# Patient Record
Sex: Female | Born: 1960
Health system: Southern US, Community
[De-identification: ages and names within clinical notes are randomized; demographics above are authoritative.]

## PROBLEM LIST (undated history)

## (undated) DIAGNOSIS — Z8781 Personal history of (healed) traumatic fracture: Secondary | ICD-10-CM

## (undated) DIAGNOSIS — Z860100 Personal history of colon polyps, unspecified: Secondary | ICD-10-CM

## (undated) DIAGNOSIS — M549 Dorsalgia, unspecified: Secondary | ICD-10-CM

## (undated) DIAGNOSIS — R42 Dizziness and giddiness: Secondary | ICD-10-CM

## (undated) DIAGNOSIS — G4730 Sleep apnea, unspecified: Secondary | ICD-10-CM

## (undated) DIAGNOSIS — M2550 Pain in unspecified joint: Secondary | ICD-10-CM

## (undated) DIAGNOSIS — E538 Deficiency of other specified B group vitamins: Secondary | ICD-10-CM

## (undated) DIAGNOSIS — E119 Type 2 diabetes mellitus without complications: Secondary | ICD-10-CM

## (undated) DIAGNOSIS — R7303 Prediabetes: Secondary | ICD-10-CM

## (undated) DIAGNOSIS — E785 Hyperlipidemia, unspecified: Secondary | ICD-10-CM

## (undated) DIAGNOSIS — K589 Irritable bowel syndrome without diarrhea: Secondary | ICD-10-CM

## (undated) DIAGNOSIS — F319 Bipolar disorder, unspecified: Secondary | ICD-10-CM

## (undated) DIAGNOSIS — F419 Anxiety disorder, unspecified: Secondary | ICD-10-CM

## (undated) DIAGNOSIS — M1990 Unspecified osteoarthritis, unspecified site: Secondary | ICD-10-CM

## (undated) DIAGNOSIS — R030 Elevated blood-pressure reading, without diagnosis of hypertension: Secondary | ICD-10-CM

## (undated) DIAGNOSIS — K5900 Constipation, unspecified: Secondary | ICD-10-CM

## (undated) DIAGNOSIS — E559 Vitamin D deficiency, unspecified: Secondary | ICD-10-CM

## (undated) DIAGNOSIS — F514 Sleep terrors [night terrors]: Secondary | ICD-10-CM

## (undated) DIAGNOSIS — Z87442 Personal history of urinary calculi: Secondary | ICD-10-CM

## (undated) DIAGNOSIS — K219 Gastro-esophageal reflux disease without esophagitis: Secondary | ICD-10-CM

## (undated) DIAGNOSIS — D649 Anemia, unspecified: Secondary | ICD-10-CM

## (undated) HISTORY — DX: Sleep terrors (night terrors): F51.4

## (undated) HISTORY — DX: Personal history of colon polyps, unspecified: Z86.0100

## (undated) HISTORY — DX: Personal history of colonic polyps: Z86.010

## (undated) HISTORY — DX: Bipolar disorder, unspecified: F31.9

## (undated) HISTORY — DX: Pain in unspecified joint: M25.50

## (undated) HISTORY — DX: Anemia, unspecified: D64.9

## (undated) HISTORY — PX: WISDOM TOOTH EXTRACTION: SHX21

## (undated) HISTORY — DX: Irritable bowel syndrome, unspecified: K58.9

## (undated) HISTORY — DX: Dizziness and giddiness: R42

## (undated) HISTORY — DX: Anxiety disorder, unspecified: F41.9

## (undated) HISTORY — PX: JOINT REPLACEMENT: SHX530

## (undated) HISTORY — DX: Constipation, unspecified: K59.00

## (undated) HISTORY — DX: Vitamin D deficiency, unspecified: E55.9

## (undated) HISTORY — DX: Personal history of (healed) traumatic fracture: Z87.81

## (undated) HISTORY — DX: Unspecified osteoarthritis, unspecified site: M19.90

## (undated) HISTORY — PX: CHOLECYSTECTOMY: SHX55

## (undated) HISTORY — DX: Hyperlipidemia, unspecified: E78.5

## (undated) HISTORY — DX: Sleep apnea, unspecified: G47.30

## (undated) HISTORY — DX: Dorsalgia, unspecified: M54.9

## (undated) HISTORY — PX: ABDOMINAL HYSTERECTOMY: SHX81

## (undated) HISTORY — DX: Deficiency of other specified B group vitamins: E53.8

## (undated) HISTORY — DX: Type 2 diabetes mellitus without complications: E11.9

---

## 2014-09-01 DIAGNOSIS — Z8781 Personal history of (healed) traumatic fracture: Secondary | ICD-10-CM

## 2014-09-01 HISTORY — DX: Personal history of (healed) traumatic fracture: Z87.81

## 2015-08-03 DIAGNOSIS — F319 Bipolar disorder, unspecified: Secondary | ICD-10-CM | POA: Insufficient documentation

## 2015-09-02 DIAGNOSIS — F319 Bipolar disorder, unspecified: Secondary | ICD-10-CM

## 2015-09-02 DIAGNOSIS — F514 Sleep terrors [night terrors]: Secondary | ICD-10-CM

## 2015-09-02 HISTORY — DX: Bipolar disorder, unspecified: F31.9

## 2015-09-02 HISTORY — DX: Sleep terrors (night terrors): F51.4

## 2017-06-16 ENCOUNTER — Encounter (HOSPITAL_COMMUNITY): Payer: Self-pay | Admitting: Emergency Medicine

## 2017-06-16 ENCOUNTER — Ambulatory Visit (HOSPITAL_COMMUNITY)
Admission: EM | Admit: 2017-06-16 | Discharge: 2017-06-16 | Disposition: A | Discharge status: Home / Self Care | Payer: Medicaid Other | Attending: Family Medicine | Admitting: Family Medicine

## 2017-06-16 DIAGNOSIS — K219 Gastro-esophageal reflux disease without esophagitis: Secondary | ICD-10-CM | POA: Diagnosis not present

## 2017-06-16 DIAGNOSIS — M7061 Trochanteric bursitis, right hip: Secondary | ICD-10-CM

## 2017-06-16 DIAGNOSIS — E785 Hyperlipidemia, unspecified: Secondary | ICD-10-CM | POA: Diagnosis not present

## 2017-06-16 DIAGNOSIS — L309 Dermatitis, unspecified: Secondary | ICD-10-CM | POA: Diagnosis not present

## 2017-06-16 DIAGNOSIS — M25561 Pain in right knee: Secondary | ICD-10-CM | POA: Diagnosis not present

## 2017-06-16 HISTORY — DX: Gastro-esophageal reflux disease without esophagitis: K21.9

## 2017-06-16 MED ORDER — OMEPRAZOLE 20 MG PO CPDR: 20.0000 mg | DELAYED_RELEASE_CAPSULE | Freq: Every day | ORAL | 1 refills | Status: DC

## 2017-06-16 MED ORDER — LIDOCAINE HCL (PF) 1 % IJ SOLN
INTRAMUSCULAR | Status: AC
  Filled 2017-06-16: qty 2

## 2017-06-16 MED ORDER — TRIAMCINOLONE ACETONIDE 40 MG/ML IJ SUSP
INTRAMUSCULAR | Status: AC
  Filled 2017-06-16: qty 1

## 2017-06-16 MED ORDER — LIDOCAINE HCL (PF) 2 % IJ SOLN
INTRAMUSCULAR | Status: AC
  Filled 2017-06-16: qty 2

## 2017-06-16 MED ORDER — TRIAMCINOLONE ACETONIDE 0.5 % EX OINT: TOPICAL_OINTMENT | CUTANEOUS | 0 refills | Status: DC

## 2017-06-16 MED ORDER — ATORVASTATIN CALCIUM 80 MG PO TABS: 80.0000 mg | ORAL_TABLET | Freq: Every day | ORAL | 1 refills | Status: DC

## 2017-06-16 MED ORDER — MELOXICAM 7.5 MG PO TABS: 7.5000 mg | ORAL_TABLET | Freq: Every day | ORAL | 0 refills | Status: DC

## 2017-06-16 NOTE — ED Triage Notes (Signed)
PT reports she has severe gastric reflux and has been out of her medications. PT just moved to [facility] and got her medicaid straightened out.  PT also has a rash on her hands that she thinks is from latex gloves she wears at work.  PT is also out of her cholesterol meds.   PT also reports right leg pain that she usually gets cortisone shots in.

## 2017-06-16 NOTE — Discharge Instructions (Signed)
Get a primary care doctor!
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
## 2017-08-20 ENCOUNTER — Other Ambulatory Visit: Payer: Self-pay | Admitting: Family Medicine

## 2017-08-23 ENCOUNTER — Ambulatory Visit (HOSPITAL_COMMUNITY)
Admission: EM | Admit: 2017-08-23 | Discharge: 2017-08-23 | Disposition: A | Discharge status: Home / Self Care | Payer: Medicaid Other | Attending: Family Medicine | Admitting: Family Medicine

## 2017-08-23 ENCOUNTER — Other Ambulatory Visit: Payer: Self-pay

## 2017-08-23 ENCOUNTER — Encounter (HOSPITAL_COMMUNITY): Payer: Self-pay | Admitting: Family Medicine

## 2017-08-23 DIAGNOSIS — K0889 Other specified disorders of teeth and supporting structures: Secondary | ICD-10-CM

## 2017-08-23 MED ORDER — PENICILLIN V POTASSIUM 500 MG PO TABS: 500.0000 mg | ORAL_TABLET | Freq: Three times a day (TID) | ORAL | 0 refills | Status: DC

## 2017-08-23 MED ORDER — HYDROCODONE-ACETAMINOPHEN 5-325 MG PO TABS: 1.0000 | ORAL_TABLET | Freq: Four times a day (QID) | ORAL | 0 refills | Status: DC | PRN

## 2017-08-23 NOTE — ED Provider Notes (Signed)
  [place]   [id number] 08/23/17 Arrival Time: 1201   SUBJECTIVE:  Kenyah Luba is a 56 y.o. female who presents to the urgent care with complaint of tooth pain in her left 1st molar, per pt she is having sharp pain that's causing her to have left ear ache and per pt she can not close her mouth.   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 GERD (gastroesophageal reflux disease)    No family history on file. Social History   Socioeconomic History  . Marital status: Married    Spouse name: Not on file  . Number of children: Not on file  . Years of education: Not on file  . Highest education level: Not on file  Social Needs  . Financial resource strain: Not on file  . Food insecurity - worry: Not on file  . Food insecurity - inability: Not on file  . Transportation needs - medical: Not on file  . Transportation needs - non-medical: Not on file  Occupational History  . Not on file  Tobacco Use  . Smoking status: Never Smoker  . Smokeless tobacco: Never Used  Substance and Sexual Activity  . Alcohol use: Yes  . Drug use: No  . Sexual activity: Not on file  Other Topics Concern  . Not on file  Social History Narrative  . Not on file   No outpatient medications have been marked as taking for the 08/23/17 encounter [facility] Encounter).   Allergies  Allergen Reactions  . Depakote [Divalproex Sodium] Other (See Comments)    Hair fell out  . Latex Rash      ROS: As per HPI, remainder of ROS negative.   OBJECTIVE:   Vitals:   08/23/17 1220  BP: (!) 157/97  Pulse: 92  Temp: 98.1 F (36.7 C)  TempSrc: Oral  SpO2: 96%     General appearance: alert; no distress Eyes: PERRL; EOMI; conjunctiva normal HENT: normocephalic; atraumatic; TMs normal, canal normal, external ears normal without trauma; nasal  mucosa normal; oral mucosa normal  Neck: supple Extremities: no cyanosis or edema; symmetrical with no gross deformities Skin: warm and dry Neurologic: normal gait; grossly normal Psychological: alert and cooperative; normal mood and affect         ASSESSMENT & PLAN:  1. Pain, dental     Meds ordered this encounter  Medications  . HYDROcodone-acetaminophen (NORCO) 5-325 MG tablet    Sig: Take 1 tablet by mouth every 6 (six) hours as needed for moderate pain.    Dispense:  12 tablet    Refill:  0  . penicillin v potassium (VEETID) 500 MG tablet    Sig: Take 1 tablet (500 mg total) by mouth 3 (three) times daily.    Dispense:  30 tablet    Refill:  0    Reviewed expectations re: course of current medical issues. Questions answered. Outlined signs and symptoms indicating need for more acute intervention. Patient verbalized understanding. After Visit Summary given.      Robyn Haber, MD 08/23/17 1228

## 2017-08-23 NOTE — ED Triage Notes (Signed)
Per pt she is having tooth pain in her left 1st molar, per pt she is having sharp pain that's causing her to have left ear ache and per pt she can not close her mouth.

## 2017-08-23 NOTE — Discharge Instructions (Signed)
Contact your dentist tomorrow

## 2017-09-06 ENCOUNTER — Other Ambulatory Visit: Payer: Self-pay | Admitting: Family Medicine

## 2017-12-10 ENCOUNTER — Other Ambulatory Visit: Payer: Self-pay | Admitting: Family Medicine

## 2018-01-01 ENCOUNTER — Other Ambulatory Visit: Payer: Self-pay | Admitting: Internal Medicine

## 2018-01-01 ENCOUNTER — Other Ambulatory Visit: Payer: Self-pay

## 2018-01-01 ENCOUNTER — Ambulatory Visit: Payer: Self-pay | Admitting: Internal Medicine

## 2018-01-01 VITALS — BP 139/92 | HR 98 | Temp 98.9°F | Ht 63.0 in | Wt 200.2 lb

## 2018-01-01 DIAGNOSIS — Z87891 Personal history of nicotine dependence: Secondary | ICD-10-CM

## 2018-01-01 DIAGNOSIS — L253 Unspecified contact dermatitis due to other chemical products: Secondary | ICD-10-CM

## 2018-01-01 DIAGNOSIS — M25561 Pain in right knee: Secondary | ICD-10-CM

## 2018-01-01 DIAGNOSIS — E785 Hyperlipidemia, unspecified: Secondary | ICD-10-CM

## 2018-01-01 DIAGNOSIS — Z79899 Other long term (current) drug therapy: Secondary | ICD-10-CM

## 2018-01-01 DIAGNOSIS — K2970 Gastritis, unspecified, without bleeding: Secondary | ICD-10-CM | POA: Insufficient documentation

## 2018-01-01 DIAGNOSIS — Z83438 Family history of other disorder of lipoprotein metabolism and other lipidemia: Secondary | ICD-10-CM

## 2018-01-01 DIAGNOSIS — L235 Allergic contact dermatitis due to other chemical products: Secondary | ICD-10-CM

## 2018-01-01 DIAGNOSIS — K219 Gastro-esophageal reflux disease without esophagitis: Secondary | ICD-10-CM

## 2018-01-01 DIAGNOSIS — Z91040 Latex allergy status: Secondary | ICD-10-CM

## 2018-01-01 DIAGNOSIS — Z0000 Encounter for general adult medical examination without abnormal findings: Secondary | ICD-10-CM

## 2018-01-01 DIAGNOSIS — M62838 Other muscle spasm: Secondary | ICD-10-CM

## 2018-01-01 MED ORDER — OMEPRAZOLE 20 MG PO CPDR: 20.0000 mg | DELAYED_RELEASE_CAPSULE | Freq: Every day | ORAL | 1 refills | Status: DC

## 2018-01-01 MED ORDER — BACLOFEN 10 MG PO TABS: 10.0000 mg | ORAL_TABLET | Freq: Three times a day (TID) | ORAL | 3 refills | Status: AC | PRN

## 2018-01-01 MED ORDER — TRIAMCINOLONE ACETONIDE 0.5 % EX OINT: TOPICAL_OINTMENT | CUTANEOUS | 0 refills | Status: DC

## 2018-01-01 MED ORDER — ATORVASTATIN CALCIUM 80 MG PO TABS: 80.0000 mg | ORAL_TABLET | Freq: Every day | ORAL | 5 refills | Status: DC

## 2018-01-01 MED FILL — ATORVASTATIN 80 MG TABLET: 80 | 30 days supply | Qty: 30 | Fill #0

## 2018-01-01 MED FILL — BACLOFEN 10 MG TABLET: 10 | 10 days supply | Qty: 30 | Fill #0

## 2018-01-01 MED FILL — OMEPRAZOLE 20 MG CAP: 20 | 30 days supply | Qty: 30 | Fill #0

## 2018-01-01 NOTE — Patient Instructions (Signed)
Ms. Lovan it was good to meet you.  Thanks for choosing [place] for your care.  We will get some blood work for you today to check your blood count, liver and renal function, in addition we will get a lipid panel to check your cholesterol.  We have given you a handout for how to get a mammogram.  We will request your shot records and other information.  We will need records from your GI office as well to confirm a negative biopsy for H. Pylori given your positive antibody testing.

## 2018-01-01 NOTE — Assessment & Plan Note (Addendum)
Diagnosed by her GI doctor in [place], has had extensive workup per pt including EGD and pH testing.  Pt has been out of omeprazole for months now and her symptoms have returned.      -will need records from GI office confirming negative H. Pylori by biopsy as pt has antibody positive result seen in care everywhere -refilled omeprazole 20mg  daily

## 2018-01-01 NOTE — Progress Notes (Signed)
CC: establish care, new pt, knee pain  HPI:  [name] is a 57 y.o. female with PMH below. She moved here in 2017 from [place] for new employment.  Up until now she has been using urgent care for the majority of her primary care needs.  She also sees a psychiatrist that has continued filling her medications despite her losing her insurance.    Please see A&P for status of the patient's chronic medical conditions  Past Medical History:  Diagnosis Date  . Bipolar depression ([place]) 09/2015  . GERD (gastroesophageal reflux disease)   . History of patellar fracture 2016  . History of tibial fracture 2016  . Hyperlipidemia   . Night terrors   . 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  HENT: Negative for hearing loss and tinnitus.   Eyes: Negative for double vision, discharge and redness.  Respiratory: Negative for shortness of breath and wheezing.   Cardiovascular: Negative for chest pain and palpitations.  Gastrointestinal: Positive for heartburn. Negative for nausea and vomiting.  Genitourinary: Negative for dysuria and urgency.  Musculoskeletal: Negative for myalgias and neck pain.  Skin: Negative for rash.  Neurological: Negative for dizziness and headaches.  Endo/Heme/Allergies: Does not bruise/bleed easily.  Psychiatric/Behavioral: Negative for depression and suicidal ideas.    Physical Exam:  Vitals:   01/01/18 0934  BP: (!) 139/92  Pulse: 98  Temp: 98.9 F (37.2 C)  TempSrc: Oral  SpO2: 94%  Weight: 200 lb 3.2 oz (90.8 kg)  Height: 5\' 3"  (1.6 m)   Physical Exam  Constitutional: No distress.  HENT:  Head: Normocephalic and atraumatic.  Mouth/Throat: Oropharynx is clear and moist.  Eyes: Right eye exhibits no discharge. Left eye exhibits no discharge.  Neck: No JVD present.  Cardiovascular: Normal rate, regular rhythm and normal heart sounds. Exam  reveals no gallop and no friction rub.  No murmur heard. Pulmonary/Chest: Effort normal and breath sounds normal. No respiratory distress. She has no wheezes. She has no rales. She exhibits no tenderness.  Abdominal: Soft. Bowel sounds are normal. She exhibits no distension and no mass. There is no tenderness. There is no rebound and no guarding.  Musculoskeletal: She exhibits deformity. She exhibits no edema.       Right knee: She exhibits decreased range of motion.  There is some pain with extension and flexion of right knee.  A midline scar is appreciated from a prior surgery.  There is crepitus felt in bilateral knees  Neurological: She is alert. She exhibits normal muscle tone. Coordination normal.  Skin: Rash (wore latex gloves has contact dermatitis on fingers from latex allergyf) noted. She is not diaphoretic.    Social History   Socioeconomic History  . Marital status: Married    Spouse name: Not on file  . Number of children: Not on file  . Years of education: Not on file  . Highest education level: Not on file  Occupational History  . Not on file  Social Needs  . Financial resource strain: Not on file  . Food insecurity:    Worry: Not on file    Inability: Not on file  . Transportation needs:    Medical: Not on file    Non-medical: Not on file  Tobacco Use  . Smoking status: Former Smoker    Packs/day: 0.25    Years: 4.00    Pack  years: 1.00    Last attempt to quit: 12/21/2017    Years since quitting: 0.0  . Smokeless tobacco: Never Used  Substance and Sexual Activity  . Alcohol use: Yes    Comment: special occasions  . Drug use: No  . Sexual activity: Not on file  Lifestyle  . Physical activity:    Days per week: Not on file    Minutes per session: Not on file  . Stress: Not on file  Relationships  . Social connections:    Talks on phone: Not on file    Gets together: Not on file    Attends religious service: Not on file    Active member of club or  organization: Not on file    Attends meetings of clubs or organizations: Not on file    Relationship status: Not on file  . Intimate partner violence:    Fear of current or ex partner: Not on file    Emotionally abused: Not on file    Physically abused: Not on file    Forced sexual activity: Not on file  Other Topics Concern  . Not on file  Social History Narrative  . Not on file    Family History  Problem Relation Age of Onset  . Hyperlipidemia Mother   . Hypertension Mother   . Arthritis/Rheumatoid Father   . Hyperlipidemia Maternal Aunt   . Hypertension Maternal Aunt   . Cancer Maternal Aunt     Assessment & Plan:   See Encounters Tab for problem based charting.  Patient discussed with Dr. Angelia Mould

## 2018-01-02 ENCOUNTER — Encounter: Payer: Self-pay | Admitting: Internal Medicine

## 2018-01-02 DIAGNOSIS — L253 Unspecified contact dermatitis due to other chemical products: Secondary | ICD-10-CM | POA: Insufficient documentation

## 2018-01-02 DIAGNOSIS — Z716 Tobacco abuse counseling: Secondary | ICD-10-CM | POA: Insufficient documentation

## 2018-01-02 DIAGNOSIS — Z0000 Encounter for general adult medical examination without abnormal findings: Secondary | ICD-10-CM | POA: Insufficient documentation

## 2018-01-02 LAB — CBC
Hematocrit: 41.7 % (ref 34.0–46.6)
Hemoglobin: 13.3 g/dL (ref 11.1–15.9)
MCH: 26.8 pg (ref 26.6–33.0)
MCHC: 31.9 g/dL (ref 31.5–35.7)
MCV: 84 fL (ref 79–97)
PLATELETS: 340 10*3/uL (ref 150–379)
RBC: 4.96 x10E6/uL (ref 3.77–5.28)
RDW: 15.3 % (ref 12.3–15.4)
WBC: 8 10*3/uL (ref 3.4–10.8)

## 2018-01-02 LAB — CMP14 + ANION GAP
ALBUMIN: 4.5 g/dL (ref 3.5–5.5)
ALK PHOS: 76 IU/L (ref 39–117)
ALT: 11 IU/L (ref 0–32)
ANION GAP: 16 mmol/L (ref 10.0–18.0)
AST: 11 IU/L (ref 0–40)
Albumin/Globulin Ratio: 2 (ref 1.2–2.2)
BUN / CREAT RATIO: 23 (ref 9–23)
BUN: 13 mg/dL (ref 6–24)
CO2: 24 mmol/L (ref 20–29)
Calcium: 9.1 mg/dL (ref 8.7–10.2)
Chloride: 100 mmol/L (ref 96–106)
Creatinine, Ser: 0.56 mg/dL — ABNORMAL LOW (ref 0.57–1.00)
GFR calc Af Amer: 119 mL/min/{1.73_m2} (ref >59)
GFR, EST NON AFRICAN AMERICAN: 103 mL/min/{1.73_m2} (ref >59)
Globulin, Total: 2.2 g/dL (ref 1.5–4.5)
Glucose: 108 mg/dL — ABNORMAL HIGH (ref 65–99)
Potassium: 4.7 mmol/L (ref 3.5–5.2)
SODIUM: 140 mmol/L (ref 134–144)
Total Protein: 6.7 g/dL (ref 6.0–8.5)

## 2018-01-02 LAB — LIPID PANEL
CHOLESTEROL TOTAL: 208 mg/dL — AB (ref 100–199)
Chol/HDL Ratio: 2.7 ratio (ref 0.0–4.4)
HDL: 76 mg/dL (ref >39)
LDL Calculated: 100 mg/dL — ABNORMAL HIGH (ref 0–99)
Triglycerides: 162 mg/dL — ABNORMAL HIGH (ref 0–149)
VLDL CHOLESTEROL CAL: 32 mg/dL (ref 5–40)

## 2018-01-02 NOTE — Assessment & Plan Note (Addendum)
Lab Results  Component Value Date   CHOL 208 (H) 01/01/2018   HDL 76 01/01/2018   LDLCALC 100 (H) 01/01/2018   TRIG 162 (H) 01/01/2018   CHOLHDL 2.7 01/01/2018   Pt has been out of lipitor for months now. She was on 80mg  daily by her old PCP.  Has a strong family history of hyperlipidemia on her mothers side.  Pt is obese as well.    -checked lipid panel today and confirms pt with LDL 100, elevated TG's but good HDL -restart atorvastatin 80mg  daily

## 2018-01-02 NOTE — Assessment & Plan Note (Signed)
Pt has rash consistent with contact dermatitis of fingers.  Rash appears to be resolving.  Has used triamcinolone cream in the past to help with this.    -will refill a tube of kenalog 0.5%

## 2018-01-02 NOTE — Assessment & Plan Note (Signed)
Pt has resulting muscle spasms in her anterior thigh and lower leg for the surgery.  She has taken baclofen in the past for this with good result.    -will represcribe baclofen 10mg  TID PRN

## 2018-01-02 NOTE — Assessment & Plan Note (Signed)
Pt reports being up to date on the majority of her screenings colonoscopy, pap smear.  She does need a mammogram.  She is unsure about her immunizations, we are obtaining records.  -gave pt information for mammogram scholarship program.

## 2018-01-05 NOTE — Progress Notes (Signed)
Internal Medicine Clinic Attending  Case discussed with Dr. Winfrey  at the time of the visit.  We reviewed the resident's history and exam and pertinent patient test results.  I agree with the assessment, diagnosis, and plan of care documented in the resident's note.  

## 2018-01-13 MED FILL — QUETIAPINE FUMARATE 25 MG T: 25 | 30 days supply | Qty: 30 | Fill #0

## 2018-01-13 MED FILL — PRAZOSIN 1 MG CAPSULE: 1 | 30 days supply | Qty: 60 | Fill #0

## 2018-01-13 MED FILL — AMOXICILLIN 500 MG CAPSULE: 500 | 7 days supply | Qty: 21 | Fill #0

## 2018-01-13 MED FILL — lamoTRIgine 25 MG TABS: 25 | 30 days supply | Qty: 60 | Fill #0

## 2018-01-13 MED FILL — ACETAMINOPHEN/COD #3 TABLET: 300-30 | 2 days supply | Qty: 12 | Fill #0

## 2018-01-27 MED FILL — ATORVASTATIN 80 MG TABLET: 80 | 30 days supply | Qty: 30 | Fill #1

## 2018-01-27 MED FILL — OMEPRAZOLE 20 MG CAP: 20 | 30 days supply | Qty: 30 | Fill #1

## 2018-02-03 ENCOUNTER — Ambulatory Visit: Payer: Self-pay

## 2018-02-03 ENCOUNTER — Encounter: Payer: Self-pay | Admitting: Internal Medicine

## 2018-02-11 MED FILL — lamoTRIgine 25 MG TABS: 25 | 30 days supply | Qty: 60 | Fill #1

## 2018-02-11 MED FILL — BACLOFEN 10 MG TABLET: 10 | 10 days supply | Qty: 30 | Fill #1

## 2018-02-11 MED FILL — QUETIAPINE FUMARATE 25 MG T: 25 | 30 days supply | Qty: 30 | Fill #1

## 2018-02-11 MED FILL — PRAZOSIN 1 MG CAPSULE: 1 | 30 days supply | Qty: 60 | Fill #1

## 2018-02-15 ENCOUNTER — Ambulatory Visit: Payer: Self-pay

## 2018-02-15 ENCOUNTER — Ambulatory Visit (INDEPENDENT_AMBULATORY_CARE_PROVIDER_SITE_OTHER): Payer: Self-pay | Admitting: Internal Medicine

## 2018-02-15 ENCOUNTER — Other Ambulatory Visit: Payer: Self-pay

## 2018-02-15 ENCOUNTER — Encounter: Payer: Self-pay | Admitting: Internal Medicine

## 2018-02-15 DIAGNOSIS — H9319 Tinnitus, unspecified ear: Secondary | ICD-10-CM

## 2018-02-15 DIAGNOSIS — R21 Rash and other nonspecific skin eruption: Secondary | ICD-10-CM

## 2018-02-15 DIAGNOSIS — L209 Atopic dermatitis, unspecified: Secondary | ICD-10-CM

## 2018-02-15 MED ORDER — TRIAMCINOLONE ACETONIDE 0.5 % EX OINT: TOPICAL_OINTMENT | CUTANEOUS | 0 refills | Status: DC

## 2018-02-15 MED FILL — TRIAMCINOLONE 0.5% OINTMENT: 0.5 | 15 days supply | Qty: 30 | Fill #0

## 2018-02-15 NOTE — Patient Instructions (Addendum)
Meghan Wilson,   I have written you a prescription for a topical steroid cream.   Apply the ointment to the areas of rash on your hands up to two times daily as needed.   I also recommend hand mositurizing with Eucerin lotion or Cerave skin protectant.   Return to clinic if your symptoms worsen or fail to improve.

## 2018-02-15 NOTE — Assessment & Plan Note (Addendum)
Patient presenting with bilateral hand rash that appears consistent with atopic dermatitis. Lesions are focal, well-circumscribed, and small located on dorsum of hand and on several fingers bilaterally. No active oozing or weeping. Minimal surrounding erythema. She denies exposure to latex or other allergens. She has been wearing latex free gloves at work.  She has previously used hand lotions/emollients, which seem to prevent these breakouts.  In the past, she has received Solu-Medrol injection and p.o. prednisone is requesting to have the injection again.  Given lack of allergen exposure patients presentation seems consistent with eczema.  Systemic steroid injection is not appropriate in this case as her lesions are small and focal and can be treated topically.  Plan:  -Triamcinolone 0.5% BID for rash PRN  -Recommended moisturizing hands with Eucerin lotion for skin protectant -RTC if symptoms worsen or fail to improve

## 2018-02-15 NOTE — Progress Notes (Signed)
   CC: hand rash  HPI:  [name] is a 57 y.o. female with a past medical history listed below here today for bilateral hand rash.  For details of today's visit and the status of her acute and chronic medical issues please refer to the assessment and plan.  Past Medical History:  Diagnosis Date  . Bipolar depression ([place]) 09/2015  . GERD (gastroesophageal reflux disease)   . History of patellar fracture 2016  . History of tibial fracture 2016  . Hyperlipidemia   . Night terrors   . Night terrors, adult 09/2015   Review of Systems:   Review of Systems  Constitutional: Negative for chills and fever.  HENT: Positive for tinnitus. Negative for ear discharge and ear pain.   Respiratory: Negative for shortness of breath.   Cardiovascular: Negative for chest pain.  Skin: Positive for itching and rash.     Physical Exam:  Vitals:   02/15/18 1522  BP: (!) 141/71  Pulse: 99  Temp: 99.2 F (37.3 C)  TempSrc: Oral  SpO2: 100%  Weight: 199 lb 12.8 oz (90.6 kg)  Height: 5\' 2"  (1.575 m)   Physical Exam  Constitutional: She appears well-developed and well-nourished. No distress.  HENT:  Head: Normocephalic and atraumatic.  Eyes: Conjunctivae are normal. No scleral icterus.  Cardiovascular: Normal rate, regular rhythm and normal heart sounds.  Pulmonary/Chest: Effort normal and breath sounds normal.  Skin: Rash noted.  Well circumscribed, focal areas of maculopapular rash without oozing; no scaling     Assessment & Plan:   See Encounters Tab for problem based charting.  Patient discussed with Dr. Lynnae January

## 2018-02-16 NOTE — Progress Notes (Signed)
Internal Medicine Clinic Attending  Case discussed with Dr. LaCroce at the time of the visit.  We reviewed the resident's history and exam and pertinent patient test results.  I agree with the assessment, diagnosis, and plan of care documented in the resident's note.  

## 2018-02-18 ENCOUNTER — Other Ambulatory Visit: Payer: Self-pay | Admitting: Obstetrics and Gynecology

## 2018-02-18 DIAGNOSIS — Z1231 Encounter for screening mammogram for malignant neoplasm of breast: Secondary | ICD-10-CM

## 2018-03-01 ENCOUNTER — Other Ambulatory Visit: Payer: Self-pay | Admitting: Internal Medicine

## 2018-03-01 DIAGNOSIS — K219 Gastro-esophageal reflux disease without esophagitis: Secondary | ICD-10-CM

## 2018-03-01 MED FILL — OMEPRAZOLE 20 MG CAP: 20 | 30 days supply | Qty: 30 | Fill #0

## 2018-03-01 MED FILL — ATORVASTATIN 80 MG TABLET: 80 | 30 days supply | Qty: 30 | Fill #2

## 2018-03-01 NOTE — Telephone Encounter (Signed)
Patient is requesting refills prilosec 20mg , pls send [facility] pharmacy

## 2018-03-10 ENCOUNTER — Other Ambulatory Visit: Payer: Self-pay | Admitting: Oncology

## 2018-03-10 ENCOUNTER — Encounter: Payer: Self-pay | Admitting: Internal Medicine

## 2018-03-10 ENCOUNTER — Ambulatory Visit (INDEPENDENT_AMBULATORY_CARE_PROVIDER_SITE_OTHER): Payer: Self-pay | Admitting: Internal Medicine

## 2018-03-10 VITALS — BP 129/80 | HR 103 | Temp 98.4°F | Ht 62.0 in | Wt 195.1 lb

## 2018-03-10 DIAGNOSIS — F3130 Bipolar disorder, current episode depressed, mild or moderate severity, unspecified: Secondary | ICD-10-CM

## 2018-03-10 DIAGNOSIS — Z79891 Long term (current) use of opiate analgesic: Secondary | ICD-10-CM

## 2018-03-10 DIAGNOSIS — L209 Atopic dermatitis, unspecified: Secondary | ICD-10-CM

## 2018-03-10 DIAGNOSIS — K2970 Gastritis, unspecified, without bleeding: Secondary | ICD-10-CM

## 2018-03-10 DIAGNOSIS — M25561 Pain in right knee: Secondary | ICD-10-CM | POA: Insufficient documentation

## 2018-03-10 DIAGNOSIS — T1490XA Injury, unspecified, initial encounter: Principal | ICD-10-CM

## 2018-03-10 DIAGNOSIS — F514 Sleep terrors [night terrors]: Secondary | ICD-10-CM

## 2018-03-10 DIAGNOSIS — Z9049 Acquired absence of other specified parts of digestive tract: Secondary | ICD-10-CM

## 2018-03-10 DIAGNOSIS — K2950 Unspecified chronic gastritis without bleeding: Secondary | ICD-10-CM

## 2018-03-10 DIAGNOSIS — Z79899 Other long term (current) drug therapy: Secondary | ICD-10-CM

## 2018-03-10 DIAGNOSIS — Z8781 Personal history of (healed) traumatic fracture: Secondary | ICD-10-CM

## 2018-03-10 DIAGNOSIS — G8929 Other chronic pain: Secondary | ICD-10-CM

## 2018-03-10 DIAGNOSIS — K219 Gastro-esophageal reflux disease without esophagitis: Secondary | ICD-10-CM

## 2018-03-10 DIAGNOSIS — E785 Hyperlipidemia, unspecified: Secondary | ICD-10-CM

## 2018-03-10 DIAGNOSIS — H9313 Tinnitus, bilateral: Secondary | ICD-10-CM

## 2018-03-10 DIAGNOSIS — Z23 Encounter for immunization: Secondary | ICD-10-CM

## 2018-03-10 HISTORY — DX: Pain in right knee: M25.561

## 2018-03-10 MED ORDER — HYDROCODONE-ACETAMINOPHEN 5-325 MG PO TABS: 1.0000 | ORAL_TABLET | Freq: Four times a day (QID) | ORAL | 0 refills | Status: DC | PRN

## 2018-03-10 MED ORDER — OMEPRAZOLE 20 MG PO CPDR: 20.0000 mg | DELAYED_RELEASE_CAPSULE | Freq: Every day | ORAL | 2 refills | Status: DC

## 2018-03-10 MED FILL — HYDROCODON-APAP 5-325: 5-325 | 13 days supply | Qty: 50 | Fill #0

## 2018-03-10 NOTE — Progress Notes (Addendum)
   CC: Hand rash, knee pain, tinnitus  HPI: [name] is a 57 y.o. female with history of HLD, GERD, bipolar depression, right knee fx and repair, and atopic dermatitis here for hand rash that is not resolving, right knee pain, and bilateral tinnitus.  Tinnitus: Patient states tinnitus has been present since 2017 and came on suddenly around that time. Pt notes she was stressed at the time. Previous medication given by another doctor did not help. Ears were looked at in May on her visit to the clinic several weeks ago and she said the dr. no sign of inflammation. She says it is very distracting to her, and makes her want to go sleep to make it go away. As there were many issues to address on this visit, patient decided to discuss this further on her next office visit. She also requests a referral to her psychiatrist, Meghan Wilson who she saw before losing her insurance. He prescribes her prazosin, lamotrigine & seroquil 25mg . Went into the hospital 2005 for depression and bipolar disorder.  Needs [facility]ep[facility]ole [facility]ill [facility].  For further details of today's visit and the status of her acute and chronic medical issues please refer to the assessment and plan.     Past Medical History:  Diagnosis Date  . Bipolar depression ([place]) 09/2015  . GERD (gastroesophageal reflux disease)   . History of patellar fracture 2016  . History of tibial fracture 2016  . Hyperlipidemia   . Night terrors   . Night terrors, adult 09/2015     Review of Systems:  Review of Systems  Constitutional: Negative for chills, fever and malaise/fatigue.  HENT: Positive for ear pain (on left) and tinnitus (bilaterally, high pitched on right). Negative for ear discharge and hearing loss.   Respiratory: Negative for shortness of breath and wheezing.   Cardiovascular: Negative for chest pain, palpitations and leg swelling.  Musculoskeletal: Positive for joint pain (pain in right knee and calf,  intermittent). Negative for falls.  Skin: Positive for itching and rash (on hands).  Neurological: Negative for dizziness, tingling, sensory change, weakness and headaches.  Psychiatric/Behavioral: Negative for depression and substance abuse. The patient is not nervous/anxious.      Physical Exam  Constitution: NAD, well-developed, obese HENT: Right & Left external ear canal wnl Eyes: EOM intact Cardiovascular: RRR, normal heart sounds Respiratory: CTAB, normal effort Abdominal: +bs, non-distended, non-tender MSK: cannot lift right knee to full extension, gait with slight limp, strength 5/5 UE & LE Skin: scarring over left knee from trauma and knee repair, scaly, red rash over ulnar aspect of 2nd digits, dry skin over knuckles    Vitals:   03/10/18 1340  BP: 129/80  Pulse: (!) 103  Temp: 98.4 F (36.9 C)  TempSrc: Oral    Assessment & Plan:   See Encounters Tab for problem based charting.  Patient seen with Dr. Beryle Beams   Medicine attending: I personally interviewed and briefly examined this patient on the day of the patient visit and reviewed pertinent clinical ,laboratory, and radiographic data  with resident physician Dr. Hayden Rasmussen and we discussed a management plan.

## 2018-03-10 NOTE — Assessment & Plan Note (Signed)
Mrs. Bruss presents today with bilateral hand rash that is found along the radial aspect of her second digit and over the joints of her fingers. The rash is not readily apparent, but there is some well circumscribed areas of dry, scaly skin that is slightly red. She states triamcinolone shone slight improvement but her hands continue to bother her. She washes her hands frequently at work and home and often has dry skin. On last office visit Eucerin cream was recommended to her. We discussed trying the cream and some hypoallergenic dish soap to keep hands from becoming irritated.   - cont. Triamcinolone - Eucerin cream to keep hands moisterized -  Dawn Free & Gentle dish soap - return if symptoms fail to improve

## 2018-03-10 NOTE — Assessment & Plan Note (Signed)
Patient requests refill for her chronic gastritis post cholecystectomy.

## 2018-03-10 NOTE — Assessment & Plan Note (Addendum)
In 2003 a friend fell on her and broke her right tibia and patella and crushed the knee joint. She has a lot of pain sometimes, and cannot make it to the restroom. She describes the pain as shooting and is the back of the leg down to the ankle. Often the knee throbs and burns. She previously had cortisone shots that helped. Dr. Arrie Eastern gave her pain medication she only took when her leg was severely hurting. Pain is 6/10 and at it's worse feels weak and cannot seem to move even when she wants. She currently works at [employer] and stands on her feet in place all day, which sometimes exacerbates her pain and makes her job difficult. She is hoping to find a job where she does not have to be on her feet as long. Right knee with limited extension, NTTP, change in gait to favor the left. We discussed that her knee will likely be a chronic problem, which she understands as she has been dealing with it for a long time.   - elevate leg at night - alternate heat and ice as tolerated - hydrocodone-acetaminophen 5-325 q6h prn

## 2018-03-10 NOTE — Patient Instructions (Signed)
Thank you for coming to the clinic today. It was a pleasure to see you.    FOLLOW-UP INSTRUCTIONS When: Three Months with Dr. Sharon Seller   For: Cholesterol check, follow-up for new medications What to bring: all of your medication bottles     Please note these changes made to your medications:   Hydrocodone-acetominophen 5mg -325mg  every 6 hours as needed for right knee pain   Atopic Dermatitis Atopic dermatitis is a skin disorder that causes inflammation of the skin. This is the most common type of eczema. Eczema is a group of skin conditions that cause the skin to be itchy, red, and swollen. This condition is generally worse during the cooler winter months and often improves during the warm summer months. Symptoms can vary from person to person. Atopic dermatitis usually starts showing signs in infancy and can last through adulthood. This condition cannot be passed from one person to another (non-contagious), but it is more common in families. Atopic dermatitis may not always be present. When it is present, it is called a flare-up. What are the causes? The exact cause of this condition is not known. Flare-ups of the condition may be triggered by:  Contact with something that you are sensitive or allergic to.  Stress.  Certain foods.  Extremely hot or cold weather.  Harsh chemicals and soaps.  Dry air.  Chlorine.  What increases the risk? This condition is more likely to develop in people who have a personal history or family history of eczema, allergies, asthma, or hay fever. What are the signs or symptoms? Symptoms of this condition include:  Dry, scaly skin.  Red, itchy rash.  Itchiness, which can be severe. This may occur before the skin rash. This can make sleeping difficult.  Skin thickening and cracking that can occur over time.  How is this diagnosed? This condition is diagnosed based on your symptoms, a medical history, and a physical exam. How is this  treated? There is no cure for this condition, but symptoms can usually be controlled. Treatment focuses on:  Controlling the itchiness and scratching. You may be given medicines, such as antihistamines or steroid creams.  Limiting exposure to things that you are sensitive or allergic to (allergens).  Recognizing situations that cause stress and developing a plan to manage stress.  If your atopic dermatitis does not get better with medicines, or if it is all over your body (widespread), a treatment using a specific type of light (phototherapy) may be used. Follow these instructions at home: Skin care  Keep your skin well-moisturized. Doing this seals in moisture and helps to prevent dryness. ? Use unscented lotions that have petroleum in them. ? Avoid lotions that contain alcohol or water. They can dry the skin.  Keep baths or showers short (less than 5 minutes) in warm water. Do not use hot water. ? Use mild, unscented cleansers for bathing. Avoid soap and bubble bath. ? Apply a moisturizer to your skin right after a bath or shower.  Do not apply anything to your skin without checking with your health care provider. General instructions  Dress in clothes made of cotton or cotton blends. Dress lightly because heat increases itchiness.  When washing your clothes, rinse your clothes twice so all of the soap is removed.  Avoid any triggers that can cause a flare-up.  Try to manage your stress.  Keep your fingernails cut short.  Avoid scratching. Scratching makes the rash and itchiness worse. It may also result in a skin  infection (impetigo) due to a break in the skin caused by scratching.  Take or apply over-the-counter and prescription medicines only as told by your health care provider.  Keep all follow-up visits as told by your health care provider. This is important.  Do not be around people who have cold sores or fever blisters. If you get the infection, it may cause your  atopic dermatitis to worsen. Contact a health care provider if:  Your itchiness interferes with sleep.  Your rash gets worse or it is not better within one week of starting treatment.  You have a fever.  You have a rash flare-up after having contact with someone who has cold sores or fever blisters. Get help right away if:  You develop pus or soft yellow scabs in the rash area. Summary  This condition causes a red rash and itchy, dry, scaly skin.  Treatment focuses on controlling the itchiness and scratching, limiting exposure to things that you are sensitive or allergic to (allergens), recognizing situations that cause stress, and developing a plan to manage stress.  Keep your skin well-moisturized.  Keep baths or showers shorter than 5 minutes and use warm water. Do not use hot water. This information is not intended to replace advice given to you by your health care provider. Make sure you discuss any questions you have with your health care provider. Document Released: 08/15/2000 Document Revised: 09/19/2016 Document Reviewed: 09/19/2016 Elsevier Interactive Patient Education  2018 [employer].   Please call our clinic if you have any questions or concerns, we may be able to help and keep you from a long and expensive emergency room wait. Our clinic and after hours phone number is [phone], the best time to call is Monday through Friday 9 am to 4 pm but there is always someone available 24/7 if you have an emergency. If you need medication refills please notify your pharmacy one week in advance and they will send [place] a request.

## 2018-03-17 MED FILL — lamoTRIgine 25 MG TABS: 25 | 30 days supply | Qty: 60 | Fill #2

## 2018-03-18 ENCOUNTER — Ambulatory Visit (HOSPITAL_COMMUNITY)
Admission: RE | Admit: 2018-03-18 | Discharge: 2018-03-18 | Disposition: A | Discharge status: Home / Self Care | Payer: Self-pay | Source: Ambulatory Visit | Attending: Obstetrics and Gynecology | Admitting: Obstetrics and Gynecology

## 2018-03-18 ENCOUNTER — Ambulatory Visit
Admission: RE | Admit: 2018-03-18 | Discharge: 2018-03-18 | Disposition: A | Discharge status: Home / Self Care | Payer: Self-pay | Source: Ambulatory Visit | Attending: Obstetrics and Gynecology | Admitting: Obstetrics and Gynecology

## 2018-03-18 ENCOUNTER — Encounter (HOSPITAL_COMMUNITY): Payer: Self-pay

## 2018-03-18 VITALS — BP 130/85 | Ht 63.0 in | Wt 193.2 lb

## 2018-03-18 DIAGNOSIS — Z1239 Encounter for other screening for malignant neoplasm of breast: Secondary | ICD-10-CM

## 2018-03-18 DIAGNOSIS — Z1231 Encounter for screening mammogram for malignant neoplasm of breast: Secondary | ICD-10-CM

## 2018-03-18 NOTE — Patient Instructions (Signed)
Explained breast self awareness with Meghan Wilson. Patient did not need a Pap smear today due to patient has a history of a hysterectomy for benign reasons. Let patient know that she doesn't need any further Pap smears due to her history of a hysterectomy for benign reasons. Referred patient to the [facility] for a screening mammogram. Appointment scheduled for Thursday, March 18, 2018 at 1000. Let patient know the [facility] will follow up with her within the next couple weeks with results of mammogram by letter or phone. Meghan Wilson verbalized understanding.  Cici Rodriges, Arvil Chaco, RN 10:37 AM

## 2018-03-18 NOTE — Progress Notes (Signed)
No complaints today.   Pap Smear: Pap smear not completed today. Last Pap smear was in 2013 in [place] and normal per patient. Per patient has no history of an abnormal Pap smear. Patient has a history of a hysterectomy in 1997 due to AUB. Patient no longer needs Pap smears due to her history of a hysterectomy for benign reasons per BCCCP and ACOG guidelines. No Pap smear results are in Epic.  Physical exam: Breasts Breasts symmetrical. No skin abnormalities bilateral breasts. No nipple retraction bilateral breasts. No nipple discharge bilateral breasts. No lymphadenopathy. No lumps palpated bilateral breasts. No complaints of pain or tenderness on exam. Referred patient to the [facility] for a screening mammogram. Appointment scheduled for Thursday, March 18, 2018 at 1000.        Pelvic/Bimanual No Pap smear completed today since patient has a history of a hysterectomy for benign reasons. Pap smear not indicated per BCCCP guidelines.   Smoking History: Patient is a former smoker that quit in April 2019.  Patient Navigation: Patient education provided. Access to services provided for patient through [facility] program.   Colorectal Cancer Screening: Patient has no history of having a colonoscopy completed. No complaints today. FIT Test given to patient to complete and return to BCCCP.  Breast and Cervical Cancer Risk Assessment: Patient has no family history of breast cancer, known genetic mutations, or radiation treatment to the chest before age 55. Patient has no history of cervical dysplasia, immunocompromised, or DES exposure in-utero.  Risk Assessment    Risk Scores      03/18/2018   Last edited by: Loletta Parish, RN   5-year risk: 1.1 %   Lifetime risk: 5.6 %

## 2018-03-19 ENCOUNTER — Other Ambulatory Visit: Payer: Self-pay | Admitting: Obstetrics and Gynecology

## 2018-03-19 ENCOUNTER — Encounter (HOSPITAL_COMMUNITY): Payer: Self-pay | Admitting: *Deleted

## 2018-03-22 ENCOUNTER — Encounter: Payer: Self-pay | Admitting: *Deleted

## 2018-03-26 LAB — FECAL OCCULT BLOOD, IMMUNOCHEMICAL: FECAL OCCULT BLD: NEGATIVE

## 2018-03-30 MED FILL — OMEPRAZOLE 20 MG CAP: 20 | 30 days supply | Qty: 30 | Fill #1

## 2018-03-30 MED FILL — ATORVASTATIN 80 MG TABLET: 80 | 30 days supply | Qty: 30 | Fill #3

## 2018-03-31 ENCOUNTER — Other Ambulatory Visit (HOSPITAL_COMMUNITY): Payer: Self-pay | Admitting: *Deleted

## 2018-03-31 DIAGNOSIS — Z0000 Encounter for general adult medical examination without abnormal findings: Secondary | ICD-10-CM

## 2018-04-01 ENCOUNTER — Encounter (HOSPITAL_COMMUNITY): Payer: Self-pay

## 2018-04-02 ENCOUNTER — Inpatient Hospital Stay: Payer: Self-pay | Attending: Obstetrics and Gynecology | Admitting: *Deleted

## 2018-04-02 ENCOUNTER — Inpatient Hospital Stay: Payer: Self-pay

## 2018-04-02 ENCOUNTER — Other Ambulatory Visit (HOSPITAL_COMMUNITY): Payer: Self-pay | Admitting: *Deleted

## 2018-04-02 VITALS — BP 130/80 | Ht 62.0 in | Wt 193.1 lb

## 2018-04-02 DIAGNOSIS — Z0000 Encounter for general adult medical examination without abnormal findings: Secondary | ICD-10-CM

## 2018-04-02 LAB — LIPID PANEL
CHOL/HDL RATIO: 2.9 ratio
Cholesterol: 171 mg/dL (ref 0–200)
HDL: 59 mg/dL (ref >40)
LDL Cholesterol: 61 mg/dL (ref 0–99)
Triglycerides: 254 mg/dL — ABNORMAL HIGH (ref <150)
VLDL: 51 mg/dL — AB (ref 0–40)

## 2018-04-02 LAB — HEMOGLOBIN A1C
Hgb A1c MFr Bld: 6.5 % — ABNORMAL HIGH (ref 4.8–5.6)
Mean Plasma Glucose: 139.85 mg/dL

## 2018-04-02 NOTE — Addendum Note (Signed)
Addended by: Oliver Hum D on: 04/02/2018 11:16 AM   Modules accepted: Orders

## 2018-04-02 NOTE — Progress Notes (Signed)
[facility] initial screening  Clinical Measurement:  Height:  62in Weight: Blood Pressure: 144/86  Blood Pressure #2: 130/80 Fasting Labs Drawn Today, will review with patient when they result.  Medical History:  Patient states that she has been diagnosed with high cholesterol. Patient states she has not been diagnosed with high blood pressure, diabetes or heart disease.  Medications:  Patients states she is taking medications for high cholesterol. Patient states she is not taking medication for  high blood pressure or diabetes.  She is not taking aspirin daily to prevent heart attack or stroke.    Blood pressure, self measurement:  Patients states she does not measure blood pressure at home.    Nutrition:  Patient states she eats 3 cups of fruit and 0.5 cups of vegetables in an average day.  Patient states she does  eat fish regularly, she eats less than half a serving of whole grains daily. She drinks less than 36 ounces of beverages with added sugar weekly.  She is currently watching her sodium intake.  She has not had 2 drinks containing alcohol in the last seven days.    Physical activity:  Patient states that she gets 0 minutes of moderate exercise in a week.  She gets 0 minutes of vigorous exercise per week.    Smoking status:  Patient states she has quit smoking and is not around any smokers.    Quality of life:  Patient states that she has had 2 bad physical days out of the last 30 days. In the last 2 weeks, she has had 2 days that she has felt down or depressed. She has had 2 days in the last 2 weeks that she has had little interest or pleasure in doing things.  Risk reduction and counseling:  Patient states she wants to lose weight and increase fruit and vegetable intake.  I encouraged her to continue with current exercise regimen and increase vegetable and fruit intake.  Navigation:  I will notify patient of lab results.  Patient is aware of 2 more health coaching sessions and a follow  up.

## 2018-04-05 ENCOUNTER — Encounter: Payer: Self-pay | Admitting: Internal Medicine

## 2018-04-05 ENCOUNTER — Telehealth (HOSPITAL_COMMUNITY): Payer: Self-pay | Admitting: *Deleted

## 2018-04-05 NOTE — Telephone Encounter (Signed)
Health coaching 2  Labs- cholesterol 171, triglycerides 254,HDL 59, LDL 61, hemoglobin A1C 6.5, mean plasma glucose 139.95  Patient is aware and understands these lab results.  Goals- Due to a leg injury,patient can only exercise in 10 minute sessions. Patient states she will do 4 sessions daily.  Patient states she has increased her consumption of fruit, vegetables and water and she cooks with olive oil versus frying.  Navigation:  Patient is aware of 1 more health coaching sessions and a follow up. Patient has an appointment for Friday, August 9th at 9:15am at [facility] regarding her A1C.  Time- 10 minutes

## 2018-04-09 ENCOUNTER — Ambulatory Visit (INDEPENDENT_AMBULATORY_CARE_PROVIDER_SITE_OTHER): Payer: Self-pay | Admitting: Internal Medicine

## 2018-04-09 ENCOUNTER — Other Ambulatory Visit: Payer: Self-pay

## 2018-04-09 VITALS — BP 126/79 | HR 106 | Temp 97.9°F | Ht 62.0 in | Wt 194.8 lb

## 2018-04-09 DIAGNOSIS — T1490XA Injury, unspecified, initial encounter: Principal | ICD-10-CM

## 2018-04-09 DIAGNOSIS — M25561 Pain in right knee: Secondary | ICD-10-CM

## 2018-04-09 DIAGNOSIS — Z9889 Other specified postprocedural states: Secondary | ICD-10-CM

## 2018-04-09 DIAGNOSIS — Z79891 Long term (current) use of opiate analgesic: Secondary | ICD-10-CM

## 2018-04-09 DIAGNOSIS — Z87311 Personal history of (healed) other pathological fracture: Secondary | ICD-10-CM

## 2018-04-09 DIAGNOSIS — G8921 Chronic pain due to trauma: Secondary | ICD-10-CM

## 2018-04-09 NOTE — Assessment & Plan Note (Signed)
RLE pain: Patient has a history of chronic RLE secondary to right tibia and patella fractures requiring surgical fixation in 2003.  She was recently seen 1 month ago at which time she reported exacerbation of her pain and she was prescribed Norco 5-325 mg which she takes PRN only. She brings the pill bottle today which has 25-30 tablets left in it. Pain medication currently helping. She usually takes it at night when the pain bothers her the most. States she used to follow up with orthopedic surgery in [place] and that in the past she has required revision of her surgery due to malposition of screws that led to exacerbation of her pain. She is concerned this current exacerbation episode might be due to the same problem and presents today requesting ortho referral.  - Continue Norco PRN. Did not provide new prescription today.  - Ortho referral placed

## 2018-04-09 NOTE — Progress Notes (Signed)
   CC: Leg pain follow up   HPI:  [name] is a 57 y.o. female with PMH listed below who presents to clinic for follow-up of right lower extremity pain.  RLE pain: Patient has a history of chronic RLE secondary to right tibia and patella fractures requiring surgical fixation in 2003.  She was recently seen 1 month ago at which time she reported exacerbation of her pain and she was prescribed Norco 5-325 mg which she takes PRN only. She brings the pill bottle today which has 25-30 tablets left in it. Pain medication currently helping. She usually takes it at night when the pain bothers her the most. States she used to follow up with orthopedic surgery in [place] and that in the past she has required revision of her surgery due to malposition of screws that led to exacerbation of her pain. She is concerned this current exacerbation episode might be due to the same problem and presents today requesting ortho referral.  - Continue Norco PRN. Did not provide new prescription today.  - Ortho referral placed   Past Medical History:  Diagnosis Date  . Bipolar depression ([place]) 09/2015  . GERD (gastroesophageal reflux disease)   . History of patellar fracture 2016  . History of tibial fracture 2016  . Hyperlipidemia   . Night terrors   . Night terrors, adult 09/2015   Review of Systems:   Review of Systems  Constitutional: Negative for chills, fever and malaise/fatigue.  Musculoskeletal: Positive for joint pain and myalgias. Negative for back pain and falls.  Neurological: Negative for dizziness and headaches.   Physical Exam: Vitals:   04/09/18 0916  BP: 126/79  Pulse: (!) 106  Temp: 97.9 F (36.6 C)  TempSrc: Oral  SpO2: 98%  Weight: 194 lb 12.8 oz (88.4 kg)  Height: 5\' 2"  (1.575 m)   General: pleasant female, well-nourished, well-developed, in no acute distress  Ext: decreased ROM of R knee, limps when ambulating   Assessment & Plan:   See Encounters Tab for problem based  charting.  Patient discussed with Dr. Daryll Drown

## 2018-04-09 NOTE — Patient Instructions (Addendum)
Ms. Kelty,   I have placed a referral to the orthopedic surgeon for evaluation of your knee and leg pain. Please let us know if there is anything else you need.   Please call us if you have any questions or concerns.   - Dr. Frederico Hamman

## 2018-04-12 NOTE — Progress Notes (Signed)
Internal Medicine Clinic Attending  Case discussed with Dr. Santos-Sanchez at the time of the visit.  We reviewed the resident's history and exam and pertinent patient test results.  I agree with the assessment, diagnosis, and plan of care documented in the resident's note.    

## 2018-04-16 MED FILL — lamoTRIgine 25 MG TABS: 25 | 30 days supply | Qty: 60 | Fill #3

## 2018-04-16 MED FILL — BACLOFEN 10 MG TABLET: 10 | 10 days supply | Qty: 30 | Fill #2

## 2018-04-19 ENCOUNTER — Ambulatory Visit (INDEPENDENT_AMBULATORY_CARE_PROVIDER_SITE_OTHER): Payer: Self-pay

## 2018-04-19 ENCOUNTER — Encounter (INDEPENDENT_AMBULATORY_CARE_PROVIDER_SITE_OTHER): Payer: Self-pay | Admitting: Orthopedic Surgery

## 2018-04-19 ENCOUNTER — Ambulatory Visit (INDEPENDENT_AMBULATORY_CARE_PROVIDER_SITE_OTHER): Payer: Self-pay | Admitting: Orthopedic Surgery

## 2018-04-19 VITALS — Ht 62.0 in | Wt 194.0 lb

## 2018-04-19 DIAGNOSIS — M25561 Pain in right knee: Secondary | ICD-10-CM

## 2018-04-19 DIAGNOSIS — G8929 Other chronic pain: Secondary | ICD-10-CM

## 2018-04-19 DIAGNOSIS — M12561 Traumatic arthropathy, right knee: Secondary | ICD-10-CM

## 2018-04-19 MED ORDER — METHYLPREDNISOLONE ACETATE 40 MG/ML IJ SUSP
40.0000 mg | INTRAMUSCULAR | Status: AC | PRN
  Administered 2018-04-19: 40 mg via INTRA_ARTICULAR

## 2018-04-19 MED ORDER — LIDOCAINE HCL 1 % IJ SOLN
5.0000 mL | INTRAMUSCULAR | Status: AC | PRN
  Administered 2018-04-19: 5 mL

## 2018-04-19 NOTE — Progress Notes (Signed)
Office Visit Note   Patient: Meghan Wilson           Date of Birth: April 07, 1961           MRN: [id number] Visit Date: 04/19/2018              Requested by: Welford Roche, MD [address] [place], [place] [address] PCP: Marty Heck, DO  Chief Complaint  Patient presents with  . Right Knee - Pain      HPI: Patient is a 57 year old woman who had a tibial plateau fracture in 2003 as well as a patella fracture in [place].  She had a revision secondary to malposition of the screws with screw removal in 2015.  She states she has increasing pain with all activities of daily living she cannot stand on her right leg.  Pain primarily medially and lateral joint line with some pain over the proximal medial screw.  Assessment & Plan: Visit Diagnoses:  1. Chronic pain of right knee   2. Traumatic arthritis of knee, right     Plan: Knee was injected she tolerated this well plan for follow-up in 4 weeks.  Patient states she is doing therapy for strengthening of her leg.  Discussed that she may be a candidate for hyaluronic acid injection.  Discussed that if the injections do not work her option would be hardware removal with total knee arthroplasty.  Follow-Up Instructions: Return in about 4 weeks (around 05/17/2018).   Ortho Exam  Patient is alert, oriented, no adenopathy, well-dressed, normal affect, normal respiratory effort. Examination patient has an antalgic gait there is no effusion she is tender to palpation over the medial and lateral joint lines as well as the proximal tibial plateau screw.  Collaterals and cruciates are stable no ligamentous instability there are no ulcers no blisters.  Imaging: Xr Knee 1-2 Views Right  Result Date: 04/19/2018 2 view radiographs of the right knee shows traumatic arthritis of the lateral joint line with no subcondylar cysts, there are  periarticular bony spurs there is some backing out of the proximal screw over the medial tibial plateau  with  hardware both medially and laterally.     Labs: Lab Results  Component Value Date   HGBA1C 6.5 (H) 04/02/2018     Lab Results  Component Value Date   ALBUMIN 4.5 01/01/2018    Body mass index is 35.48 kg/m.  Orders:  Orders Placed This Encounter  Procedures  . Large Joint Inj  . XR Knee 1-2 Views Right   No orders of the defined types were placed in this encounter.    Procedures: Large Joint Inj: R knee on 04/19/2018 8:52 AM Indications: pain and diagnostic evaluation Details: 22 G 1.5 in needle, anteromedial approach  Arthrogram: No  Medications: 5 mL lidocaine 1 %; 40 mg methylPREDNISolone acetate 40 MG/ML Outcome: tolerated well, no immediate complications Procedure, treatment alternatives, risks and benefits explained, specific risks discussed. Consent was given by the patient. Immediately prior to procedure a time out was called to verify the correct patient, procedure, equipment, support staff and site/side marked as required. Patient was prepped and draped in the usual sterile fashion.      Clinical Data: No additional findings.  ROS:  All other systems negative, except as noted in the HPI. Review of Systems  Objective: Vital Signs: Ht 5\' 2"  (1.575 m)   Wt 194 lb (88 kg)   BMI 35.48 kg/m   Specialty Comments:  No specialty comments available.  PMFS History: Patient Active Problem List   Diagnosis Date Noted  . Pain of right knee after injury 03/10/2018  . Atopic dermatitis 02/15/2018  . 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 Healthcare maintenance 01/02/2018  . Hyperlipidemia 01/01/2018  . Peptic gastritis 01/01/2018  . Muscle spasm of right lower extremity 01/01/2018  . Morbid obesity ([place]) 08/03/2015  . Bipolar disorder ([place]) 08/03/2015   Past Medical History:  Diagnosis Date  . Bipolar depression ([place]) 09/2015  . GERD (gastroesophageal reflux disease)   . History of patellar fracture 2016  . History of tibial  fracture 2016  . Hyperlipidemia   . Night terrors   . Night terrors, adult 09/2015    Family History  Problem Relation Age of Onset  . Hyperlipidemia Mother   . Hypertension Mother   . Arthritis/Rheumatoid Father   . Hyperlipidemia Maternal Aunt   . Hypertension Maternal Aunt   . Cancer Maternal Aunt     Past Surgical History:  Procedure Laterality Date  . CHOLECYSTECTOMY    . JOINT REPLACEMENT     Social History   Occupational History  . Not on file  Tobacco Use  . Smoking status: Former Smoker    Packs/day: 0.25    Years: 4.00    Pack years: 1.00    Last attempt to quit: 12/21/2017    Years since quitting: 0.3  . Smokeless tobacco: Never Used  Substance and Sexual Activity  . Alcohol use: Yes    Comment: special occasions  . Drug use: No  . Sexual activity: Yes    Birth control/protection: None    Comment: Celibate

## 2018-04-28 MED FILL — OMEPRAZOLE 20 MG CAP: 20 | 30 days supply | Qty: 30 | Fill #0

## 2018-04-28 MED FILL — ATORVASTATIN 80 MG TABLET: 80 | 30 days supply | Qty: 30 | Fill #4

## 2018-04-30 ENCOUNTER — Ambulatory Visit: Payer: Self-pay

## 2018-05-06 ENCOUNTER — Ambulatory Visit (INDEPENDENT_AMBULATORY_CARE_PROVIDER_SITE_OTHER): Payer: Self-pay | Admitting: Internal Medicine

## 2018-05-06 ENCOUNTER — Encounter: Payer: Self-pay | Admitting: Internal Medicine

## 2018-05-06 ENCOUNTER — Other Ambulatory Visit: Payer: Self-pay

## 2018-05-06 VITALS — BP 136/80 | HR 84 | Temp 98.2°F | Wt 191.4 lb

## 2018-05-06 DIAGNOSIS — E785 Hyperlipidemia, unspecified: Secondary | ICD-10-CM

## 2018-05-06 DIAGNOSIS — R030 Elevated blood-pressure reading, without diagnosis of hypertension: Secondary | ICD-10-CM | POA: Insufficient documentation

## 2018-05-06 DIAGNOSIS — Z79899 Other long term (current) drug therapy: Secondary | ICD-10-CM

## 2018-05-06 DIAGNOSIS — Z833 Family history of diabetes mellitus: Secondary | ICD-10-CM

## 2018-05-06 DIAGNOSIS — E663 Overweight: Secondary | ICD-10-CM

## 2018-05-06 DIAGNOSIS — R7303 Prediabetes: Secondary | ICD-10-CM

## 2018-05-06 DIAGNOSIS — Z6835 Body mass index (BMI) 35.0-35.9, adult: Secondary | ICD-10-CM

## 2018-05-06 NOTE — Assessment & Plan Note (Signed)
Patient has an A1c of 6.5, and at [facility] assessment. She denies any polyuria or polydipsia. No change in her appetite or weight. Multiple family members with diabetes and patient is obese.  She is at borderline for prediabetes and diabetes.  She is very motivated to make changes in her lifestyle. We had a long discussion regarding exercising regularly, modify her diet with low-carb diet and lose some weight that should help with her A1c.  Patient seems understanding. She was given a referral  to see [name] to help achieve her goals. She was provided with information about diabetic diet.

## 2018-05-06 NOTE — Progress Notes (Signed)
   CC: Follow-up on A1c from [facility] program.  HPI:  [name] is a 57 y.o. with past medical history as listed below sent to the clinic from [facility] program after finding her A1c at 6.5 and mildly elevated blood pressure.  Patient has many family members with diabetes. She has no complaints today but was worried about her A1c and seems very motivated to make lifestyle modifications so she do not have to start on any medications.  Please see assessment and plan for her problems.  Past Medical History:  Diagnosis Date  . Bipolar depression ([place]) 09/2015  . GERD (gastroesophageal reflux disease)   . History of patellar fracture 2016  . History of tibial fracture 2016  . Hyperlipidemia   . Night terrors   . Night terrors, adult 09/2015   Review of Systems: Negative except mentioned in HPI.  Physical Exam:  Vitals:   05/06/18 1049  BP: 139/78  Pulse: 93  Temp: 98.2 F (36.8 C)  TempSrc: Oral  SpO2: 99%  Weight: 191 lb 6.4 oz (86.8 kg)    General: Vital signs reviewed.  Patient is well-developed and well-nourished, in no acute distress and cooperative with exam.  Head: Normocephalic and atraumatic. Eyes: EOMI, conjunctivae normal, no scleral icterus.  Cardiovascular: RRR, S1 normal, S2 normal, no murmurs, gallops, or rubs. Pulmonary/Chest: Clear to auscultation bilaterally, no wheezes, rales, or rhonchi. Abdominal: Soft, non-tender, non-distended, BS +, no masses, organomegaly, or guarding present.  Extremities: No lower extremity edema bilaterally,  pulses symmetric and intact bilaterally. No cyanosis or clubbing. Neurological: A&O x3, Strength is normal and symmetric bilaterally, cranial nerve II-XII are grossly intact, no focal motor deficit, sensory intact to light touch bilaterally.  Skin: Warm, dry and intact. No rashes or erythema. Psychiatric: Normal mood and affect. speech and behavior is normal. Cognition and memory are normal.  Assessment & Plan:    See Encounters Tab for problem based charting.  Patient discussed with Dr. Eppie Gibson.

## 2018-05-06 NOTE — Assessment & Plan Note (Signed)
She was also worried about her cholesterol. She is on Lipitor 80 mg daily since May 2019. Is having mild hyper hypertriglyceridemia at 252, LDL of 61 and HDL of 59.  Her ASCVD risk score is 8.9% which can be decreased to 2.5% with risk modification.  She needs moderate to high intensity statin, she is on high-dose statin already, and tolerating it very well.  We talked about watching diet and exercise and continue to take her Lipitor.

## 2018-05-06 NOTE — Patient Instructions (Signed)
Thank you for visiting our clinic today. As we discussed you are at the border of diabetes and prediabetic.  Because of your family history of diabetes and being overweight you are at high risk for diabetes. Most important thing at this point to lose some weight by exercise and follow a diet which is low in carbohydrate and salt both. As we also discussed that your blood pressure is little high, I think we can control it with your lifestyle modifications which include low-salt diet, losing some weight and exercise. I am also giving you a referral to see our diabetes coordinator [name] who can help you achieve these goals. Please follow-up in 3 months with your PCP.   Diabetes Mellitus and Nutrition When you have diabetes (diabetes mellitus), it is very important to have healthy eating habits because your blood sugar (glucose) levels are greatly affected by what you eat and drink. Eating healthy foods in the appropriate amounts, at about the same times every day, can help you:  Control your blood glucose.  Lower your risk of heart disease.  Improve your blood pressure.  Reach or maintain a healthy weight.  Every person with diabetes is different, and each person has different needs for a meal plan. Your health care provider may recommend that you work with a diet and nutrition specialist (dietitian) to make a meal plan that is best for you. Your meal plan may vary depending on factors such as:  The calories you need.  The medicines you take.  Your weight.  Your blood glucose, blood pressure, and cholesterol levels.  Your activity level.  Other health conditions you have, such as heart or kidney disease.  How do carbohydrates affect me? Carbohydrates affect your blood glucose level more than any other type of food. Eating carbohydrates naturally increases the amount of glucose in your blood. Carbohydrate counting is a method for keeping track of how many carbohydrates you eat. Counting  carbohydrates is important to keep your blood glucose at a healthy level, especially if you use insulin or take certain oral diabetes medicines. It is important to know how many carbohydrates you can safely have in each meal. This is different for every person. Your dietitian can help you calculate how many carbohydrates you should have at each meal and for snack. Foods that contain carbohydrates include:  Bread, cereal, rice, pasta, and crackers.  Potatoes and corn.  Peas, beans, and lentils.  Milk and yogurt.  Fruit and juice.  Desserts, such as cakes, cookies, ice cream, and candy.  How does alcohol affect me? Alcohol can cause a sudden decrease in blood glucose (hypoglycemia), especially if you use insulin or take certain oral diabetes medicines. Hypoglycemia can be a life-threatening condition. Symptoms of hypoglycemia (sleepiness, dizziness, and confusion) are similar to symptoms of having too much alcohol. If your health care provider says that alcohol is safe for you, follow these guidelines:  Limit alcohol intake to no more than 1 drink per day for nonpregnant women and 2 drinks per day for men. One drink equals 12 oz of beer, 5 oz of wine, or 1 oz of hard liquor.  Do not drink on an empty stomach.  Keep yourself hydrated with water, diet soda, or unsweetened iced tea.  Keep in mind that regular soda, juice, and other mixers may contain a lot of sugar and must be counted as carbohydrates.  What are tips for following this plan? Reading food labels  Start by checking the serving size on the  label. The amount of calories, carbohydrates, fats, and other nutrients listed on the label are based on one serving of the food. Many foods contain more than one serving per package.  Check the total grams (g) of carbohydrates in one serving. You can calculate the number of servings of carbohydrates in one serving by dividing the total carbohydrates by 15. For example, if a food has 30 g  of total carbohydrates, it would be equal to 2 servings of carbohydrates.  Check the number of grams (g) of saturated and trans fats in one serving. Choose foods that have low or no amount of these fats.  Check the number of milligrams (mg) of sodium in one serving. Most people should limit total sodium intake to less than 2,300 mg per day.  Always check the nutrition information of foods labeled as "low-fat" or "nonfat". These foods may be higher in added sugar or refined carbohydrates and should be avoided.  Talk to your dietitian to identify your daily goals for nutrients listed on the label. Shopping  Avoid buying canned, premade, or processed foods. These foods tend to be high in fat, sodium, and added sugar.  Shop around the outside edge of the grocery store. This includes fresh fruits and vegetables, bulk grains, fresh meats, and fresh dairy. Cooking  Use low-heat cooking methods, such as baking, instead of high-heat cooking methods like deep frying.  Cook using healthy oils, such as olive, canola, or sunflower oil.  Avoid cooking with butter, cream, or high-fat meats. Meal planning  Eat meals and snacks regularly, preferably at the same times every day. Avoid going long periods of time without eating.  Eat foods high in fiber, such as fresh fruits, vegetables, beans, and whole grains. Talk to your dietitian about how many servings of carbohydrates you can eat at each meal.  Eat 4-6 ounces of lean protein each day, such as lean meat, chicken, fish, eggs, or tofu. 1 ounce is equal to 1 ounce of meat, chicken, or fish, 1 egg, or 1/4 cup of tofu.  Eat some foods each day that contain healthy fats, such as avocado, nuts, seeds, and fish. Lifestyle   Check your blood glucose regularly.  Exercise at least 30 minutes 5 or more days each week, or as told by your health care provider.  Take medicines as told by your health care provider.  Do not use any products that contain  nicotine or tobacco, such as cigarettes and e-cigarettes. If you need help quitting, ask your health care provider.  Work with a Social worker or diabetes educator to identify strategies to manage stress and any emotional and social challenges. What are some questions to ask my health care provider?  Do I need to meet with a diabetes educator?  Do I need to meet with a dietitian?  What number can I call if I have questions?  When are the best times to check my blood glucose? Where to find more information:  American Diabetes Association: diabetes.org/food-and-fitness/food  Academy of Nutrition and Dietetics: PokerClues.dk  [employer] of Diabetes and Digestive and Kidney Diseases (NIH): ContactWire.be Summary  A healthy meal plan will help you control your blood glucose and maintain a healthy lifestyle.  Working with a diet and nutrition specialist (dietitian) can help you make a meal plan that is best for you.  Keep in mind that carbohydrates and alcohol have immediate effects on your blood glucose levels. It is important to count carbohydrates and to use alcohol carefully. This information  is not intended to replace advice given to you by your health care provider. Make sure you discuss any questions you have with your health care provider. Document Released: 05/15/2005 Document Revised: 09/22/2016 Document Reviewed: 09/22/2016 Elsevier Interactive Patient Education  2018 [place] Eating Plan DASH stands for "Dietary Approaches to Stop Hypertension." The DASH eating plan is a healthy eating plan that has been shown to reduce high blood pressure (hypertension). It may also reduce your risk for type 2 diabetes, heart disease, and stroke. The DASH eating plan may also help with weight loss. What are tips for following this plan? General  guidelines  Avoid eating more than 2,300 mg (milligrams) of salt (sodium) a day. If you have hypertension, you may need to reduce your sodium intake to 1,500 mg a day.  Limit alcohol intake to no more than 1 drink a day for nonpregnant women and 2 drinks a day for men. One drink equals 12 oz of beer, 5 oz of wine, or 1 oz of hard liquor.  Work with your health care provider to maintain a healthy body weight or to lose weight. Ask what an ideal weight is for you.  Get at least 30 minutes of exercise that causes your heart to beat faster (aerobic exercise) most days of the week. Activities may include walking, swimming, or biking.  Work with your health care provider or diet and nutrition specialist (dietitian) to adjust your eating plan to your individual calorie needs. Reading food labels  Check food labels for the amount of sodium per serving. Choose foods with less than 5 percent of the Daily Value of sodium. Generally, foods with less than 300 mg of sodium per serving fit into this eating plan.  To find whole grains, look for the word "whole" as the first word in the ingredient list. Shopping  Buy products labeled as "low-sodium" or "no salt added."  Buy fresh foods. Avoid canned foods and premade or frozen meals. Cooking  Avoid adding salt when cooking. Use salt-free seasonings or herbs instead of table salt or sea salt. Check with your health care provider or pharmacist before using salt substitutes.  Do not fry foods. Cook foods using healthy methods such as baking, boiling, grilling, and broiling instead.  Cook with heart-healthy oils, such as olive, canola, soybean, or sunflower oil. Meal planning   Eat a balanced diet that includes: ? 5 or more servings of fruits and vegetables each day. At each meal, try to fill half of your plate with fruits and vegetables. ? Up to 6-8 servings of whole grains each day. ? Less than 6 oz of lean meat, poultry, or fish each day. A 3-oz  serving of meat is about the same size as a deck of cards. One egg equals 1 oz. ? 2 servings of low-fat dairy each day. ? A serving of nuts, seeds, or beans 5 times each week. ? Heart-healthy fats. Healthy fats called Omega-3 fatty acids are found in foods such as flaxseeds and coldwater fish, like sardines, salmon, and mackerel.  Limit how much you eat of the following: ? Canned or prepackaged foods. ? Food that is high in trans fat, such as fried foods. ? Food that is high in saturated fat, such as fatty meat. ? Sweets, desserts, sugary drinks, and other foods with added sugar. ? Full-fat dairy products.  Do not salt foods before eating.  Try to eat at least 2 vegetarian meals each week.  Eat more home-cooked food  and less restaurant, buffet, and fast food.  When eating at a restaurant, ask that your food be prepared with less salt or no salt, if possible. What foods are recommended? The items listed may not be a complete list. Talk with your dietitian about what dietary choices are best for you. Grains Whole-grain or whole-wheat bread. Whole-grain or whole-wheat pasta. Brown rice. [place]. Bulgur. Whole-grain and low-sodium cereals. Pita bread. Low-fat, low-sodium crackers. Whole-wheat flour tortillas. Vegetables Fresh or frozen vegetables (raw, steamed, roasted, or grilled). Low-sodium or reduced-sodium tomato and vegetable juice. Low-sodium or reduced-sodium tomato sauce and tomato paste. Low-sodium or reduced-sodium canned vegetables. Fruits All fresh, dried, or frozen fruit. Canned fruit in natural juice (without added sugar). Meat and other protein foods Skinless chicken or [place]. Ground chicken or [place]. Pork with fat trimmed off. Fish and seafood. Egg whites. Dried beans, peas, or lentils. Unsalted nuts, nut butters, and seeds. Unsalted canned beans. Lean cuts of beef with fat trimmed off. Low-sodium, lean deli meat. Dairy Low-fat (1%) or fat-free (skim) milk.  Fat-free, low-fat, or reduced-fat cheeses. Nonfat, low-sodium ricotta or cottage cheese. Low-fat or nonfat yogurt. Low-fat, low-sodium cheese. Fats and oils Soft margarine without trans fats. Vegetable oil. Low-fat, reduced-fat, or light mayonnaise and salad dressings (reduced-sodium). Canola, safflower, olive, soybean, and sunflower oils. Avocado. Seasoning and other foods Herbs. Spices. Seasoning mixes without salt. Unsalted popcorn and pretzels. Fat-free sweets. What foods are not recommended? The items listed may not be a complete list. Talk with your dietitian about what dietary choices are best for you. Grains Baked goods made with fat, such as croissants, muffins, or some breads. Dry pasta or rice meal packs. Vegetables Creamed or fried vegetables. Vegetables in a cheese sauce. Regular canned vegetables (not low-sodium or reduced-sodium). Regular canned tomato sauce and paste (not low-sodium or reduced-sodium). Regular tomato and vegetable juice (not low-sodium or reduced-sodium). Angie Fava. Olives. Fruits Canned fruit in a light or heavy syrup. Fried fruit. Fruit in cream or butter sauce. Meat and other protein foods Fatty cuts of meat. Ribs. Fried meat. Berniece Salines. Sausage. Bologna and other processed lunch meats. Salami. Fatback. Hotdogs. Bratwurst. Salted nuts and seeds. Canned beans with added salt. Canned or smoked fish. Whole eggs or egg yolks. Chicken or [place] with skin. Dairy Whole or 2% milk, cream, and half-and-half. Whole or full-fat cream cheese. Whole-fat or sweetened yogurt. Full-fat cheese. Nondairy creamers. Whipped toppings. Processed cheese and cheese spreads. Fats and oils Butter. Stick margarine. Lard. Shortening. Ghee. Bacon fat. Tropical oils, such as coconut, palm kernel, or palm oil. Seasoning and other foods Salted popcorn and pretzels. Onion salt, garlic salt, seasoned salt, table salt, and sea salt. Worcestershire sauce. Tartar sauce. Barbecue sauce. Teriyaki sauce.  Soy sauce, including reduced-sodium. Steak sauce. Canned and packaged gravies. Fish sauce. Oyster sauce. Cocktail sauce. Horseradish that you find on the shelf. Ketchup. Mustard. Meat flavorings and tenderizers. Bouillon cubes. Hot sauce and Tabasco sauce. Premade or packaged marinades. Premade or packaged taco seasonings. Relishes. Regular salad dressings. Where to find more information:  National Heart, Lung, and [place]: https://wilson-eaton.com/  American Heart Association: www.heart.org Summary  The DASH eating plan is a healthy eating plan that has been shown to reduce high blood pressure (hypertension). It may also reduce your risk for type 2 diabetes, heart disease, and stroke.  With the DASH eating plan, you should limit salt (sodium) intake to 2,300 mg a day. If you have hypertension, you may need to reduce your sodium intake to 1,500 mg a  day.  When on the DASH eating plan, aim to eat more fresh fruits and vegetables, whole grains, lean proteins, low-fat dairy, and heart-healthy fats.  Work with your health care provider or diet and nutrition specialist (dietitian) to adjust your eating plan to your individual calorie needs. This information is not intended to replace advice given to you by your health care provider. Make sure you discuss any questions you have with your health care provider. Document Released: 08/07/2011 Document Revised: 08/11/2016 Document Reviewed: 08/11/2016 Elsevier Interactive Patient Education  [employer].

## 2018-05-06 NOTE — Assessment & Plan Note (Signed)
Was having mildly elevated blood pressure in 130s. With history of hypertension.  At this time she needs lifestyle modifications with DASH diet, exercise and weight loss.  We will keep monitoring her blood pressure. Antihypertensives can be added if needed, starting her with ACE inhibitor or ARB will be better because of her prediabetes.

## 2018-05-10 ENCOUNTER — Encounter: Payer: Self-pay | Admitting: Dietician

## 2018-05-10 ENCOUNTER — Ambulatory Visit: Payer: Self-pay | Admitting: Dietician

## 2018-05-10 DIAGNOSIS — R7303 Prediabetes: Secondary | ICD-10-CM

## 2018-05-10 NOTE — Progress Notes (Signed)
Documentation: met with patient to go over information about her pre-diabetes. She asked very good questions. She is already working on changing her diet and plans to begin exercise at [employer] today. She is is interested in the [facility] pre-diabetes classes that are free. Plan to refer her to the classes as soon as possible (when information can be obtained or refer her to [place] funded pre-diabetes classes with start date to be determined.   Debera Lat, RD 05/11/2018 2:03 PM.

## 2018-05-11 ENCOUNTER — Encounter: Payer: Self-pay | Admitting: Dietician

## 2018-05-11 NOTE — Progress Notes (Signed)
Case discussed with Dr. Amin at the time of the visit.  We reviewed the resident's history and exam and pertinent patient test results.  I agree with the assessment, diagnosis and plan of care documented in the resident's note. 

## 2018-05-11 NOTE — Patient Instructions (Addendum)
Please call me if you have not heard from me about classes for prediabetes in the next 4 weeks.   If the county does not have classes, [place] [facility] may be starting some that are grant funded. (FREE)  [name] [phone]

## 2018-05-18 ENCOUNTER — Telehealth: Payer: Self-pay | Admitting: Dietician

## 2018-05-18 ENCOUNTER — Ambulatory Visit: Payer: Self-pay

## 2018-05-18 NOTE — Telephone Encounter (Signed)
Called patient with phone number and information about [facility] Pre-diabetes/DPP classes. She says she feels much better is eating healthier, her weight is now 186#, walks at work. She inteneds to call about DPP classes for continued support. [name] Plyler, RD 05/18/2018 10:05 AM.

## 2018-05-20 ENCOUNTER — Ambulatory Visit (INDEPENDENT_AMBULATORY_CARE_PROVIDER_SITE_OTHER): Payer: Self-pay | Admitting: Orthopedic Surgery

## 2018-05-20 MED FILL — lamoTRIgine 25 MG TABS: 25 | 30 days supply | Qty: 60 | Fill #0

## 2018-05-26 ENCOUNTER — Ambulatory Visit (INDEPENDENT_AMBULATORY_CARE_PROVIDER_SITE_OTHER): Payer: Self-pay | Admitting: Family

## 2018-05-26 ENCOUNTER — Encounter (INDEPENDENT_AMBULATORY_CARE_PROVIDER_SITE_OTHER): Payer: Self-pay | Admitting: Family

## 2018-05-26 VITALS — Ht 62.0 in | Wt 192.4 lb

## 2018-05-26 DIAGNOSIS — M1731 Unilateral post-traumatic osteoarthritis, right knee: Secondary | ICD-10-CM

## 2018-05-26 MED ORDER — METHYLPREDNISOLONE ACETATE 40 MG/ML IJ SUSP
40.0000 mg | INTRAMUSCULAR | Status: AC | PRN
  Administered 2018-05-26: 40 mg via INTRA_ARTICULAR

## 2018-05-26 MED ORDER — LIDOCAINE HCL 1 % IJ SOLN
5.0000 mL | INTRAMUSCULAR | Status: AC | PRN
  Administered 2018-05-26: 5 mL

## 2018-05-26 NOTE — Progress Notes (Signed)
Office Visit Note   Patient: Meghan Wilson           Date of Birth: [birth year]           MRN: [id number] Visit Date: 05/26/2018              Requested by: Molli Hazard A, DO [address],  [id number] PCP: Marty Heck, DO  Chief Complaint  Patient presents with  . Right Knee - Follow-up      HPI: Patient is a 57 year old woman who had a tibial plateau fracture in 2003 as well as a patella fracture in [place].  She had a revision secondary to malposition of the screws with screw removal in 2015.  She states she has increasing pain with all activities of daily living she cannot stand on her right leg.  Pain primarily medially and lateral joint line with some pain over the proximal medial screw.  Is seen today in follow up. States had no relief with last depomedrol injection about a month ago. States typically has good relief with these injections.   Assessment & Plan: Visit Diagnoses:  No diagnosis found.  Plan: Knee was injected with depomedrol today. she tolerated this well plan for follow-up as needed.  Discussed that she may be a candidate for hyaluronic acid injection if this injection does not provide relief will call office and request we order this for her and call her when approved.    Discussed that if the injections do not work her option would be hardware removal with total knee arthroplasty.  Follow-Up Instructions: No follow-ups on file.   Ortho Exam  Patient is alert, oriented, no adenopathy, well-dressed, normal affect, normal respiratory effort. Examination patient has an antalgic gait there is no effusion she is tender to palpation over the medial > lateral joint lines as well as the proximal tibial plateau screw.  Collaterals and cruciates are stable no ligamentous instability there are no ulcers no blisters.  Imaging: No results found.    Labs: Lab Results  Component Value Date   HGBA1C 6.5 (H) 04/02/2018     Lab Results    Component Value Date   ALBUMIN 4.5 01/01/2018    Body mass index is 35.19 kg/m.  Orders:  No orders of the defined types were placed in this encounter.  No orders of the defined types were placed in this encounter.    Procedures: Large Joint Inj: R knee on 05/26/2018 11:05 AM Indications: pain Details: 18 G 1.5 in needle, anteromedial approach Medications: 5 mL lidocaine 1 %; 40 mg methylPREDNISolone acetate 40 MG/ML Consent was given by the patient.      Clinical Data: No additional findings.  ROS:  All other systems negative, except as noted in the HPI. Review of Systems  Constitutional: Negative for chills and fever.  Musculoskeletal: Positive for arthralgias.    Objective: Vital Signs: Ht 5\' 2"  (1.575 m)   Wt 192 lb 6.4 oz (87.3 kg)   BMI 35.19 kg/m   Specialty Comments:  No specialty comments available.  PMFS History: Patient Active Problem List   Diagnosis Date Noted  . Pre-diabetes 05/06/2018  . Elevated BP without diagnosis of hypertension 05/06/2018  . Atopic dermatitis 02/15/2018  . 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 Healthcare maintenance 01/02/2018  . Hyperlipidemia 01/01/2018  . Peptic gastritis 01/01/2018  . Morbid obesity ([place]) 08/03/2015  . Bipolar disorder ([place]) 08/03/2015   Past Medical History:  Diagnosis Date  .  Bipolar depression ([employer]) 09/2015  . GERD (gastroesophageal reflux disease)   . History of patellar fracture 2016  . History of tibial fracture 2016  . Hyperlipidemia   . Night terrors   . Night terrors, adult 09/2015    Family History  Problem Relation Age of Onset  . Hyperlipidemia Mother   . Hypertension Mother   . Arthritis/Rheumatoid Father   . Hyperlipidemia Maternal Aunt   . Hypertension Maternal Aunt   . Cancer Maternal Aunt     Past Surgical History:  Procedure Laterality Date  . CHOLECYSTECTOMY    . JOINT REPLACEMENT     Social History   Occupational History  . Not on file   Tobacco Use  . Smoking status: Former Smoker    Packs/day: 0.25    Years: 4.00    Pack years: 1.00    Last attempt to quit: 12/21/2017    Years since quitting: 0.4  . Smokeless tobacco: Never Used  Substance and Sexual Activity  . Alcohol use: Yes    Comment: special occasions  . Drug use: No  . Sexual activity: Yes    Birth control/protection: None    Comment: Celibate

## 2018-06-01 MED FILL — ATORVASTATIN 80 MG TABLET: 80 | 30 days supply | Qty: 30 | Fill #5

## 2018-06-01 MED FILL — OMEPRAZOLE 20 MG CPDR: 20 | 30 days supply | Qty: 30 | Fill #1

## 2018-06-18 MED FILL — lamoTRIgine 25 MG TABS: 25 | 30 days supply | Qty: 60 | Fill #1

## 2018-06-22 ENCOUNTER — Ambulatory Visit (INDEPENDENT_AMBULATORY_CARE_PROVIDER_SITE_OTHER): Payer: Self-pay | Admitting: Internal Medicine

## 2018-06-22 VITALS — BP 143/82 | HR 102 | Temp 99.3°F | Wt 188.9 lb

## 2018-06-22 DIAGNOSIS — M2550 Pain in unspecified joint: Secondary | ICD-10-CM

## 2018-06-22 DIAGNOSIS — L209 Atopic dermatitis, unspecified: Secondary | ICD-10-CM

## 2018-06-22 DIAGNOSIS — Z8261 Family history of arthritis: Secondary | ICD-10-CM

## 2018-06-22 MED ORDER — METHYLPREDNISOLONE ACETATE 40 MG/ML IJ SUSP
40.0000 mg | Freq: Once | INTRAMUSCULAR | Status: AC
  Administered 2018-06-22: 40 mg via INTRAMUSCULAR

## 2018-06-22 NOTE — Assessment & Plan Note (Signed)
She has been having symptoms of dry, painful skin on both hands bilaterally for two years. She does not wash her dishes without non-latex gloves, wears soft gloves when using eucerin or triamcinolone cream. Her symptoms have only gotten worse and hands more painful. Tx with high potency steroid has not had any effect. She also now has a 2x2 cm area on her abdomen periumbilically with flaky, dry, erythematous skin. She is wearing jeans and states she does not wear these very often. She says she has not had similar symptoms when wearing earrings. She states a steroid shot was used before when she went to urgent care a year ago and this helped her symptoms, which resolved for 6 months. She denies abdominal pain, nausea, diarrhea, or changes in BM. She does have joint pain and has a family history of arthritis, her dad being diagnosed at age 24.   - refer to dermatology to differentiate atopic dermatitis vs. Possible nickel or other allergy - methylprednisolone 40 mg injection today  - f/u in one month/after dermatology appointment to f/u recs.

## 2018-06-22 NOTE — Patient Instructions (Addendum)
Thank you for allowing [place] to provide your care today. Today we discussed your atopic dermatitis.   Today, you were given a methylprednisolone 40 mg corticosteroid injection to help with your symptoms.   I have also referred you to dermatology for skin testing   Please follow-up in one month after seeing dermatology or if your symptoms persist.    Should you have any questions or concerns please call the internal medicine clinic at [phone].

## 2018-06-22 NOTE — Progress Notes (Addendum)
   CC: refractory atopic dermatitis  HPI:  [name] is a 57 y.o. with PMH as below presenting for continued dryness, itching, and cracking of her hands.   Atopic Dermatitis     Please see A&P for assessment of the patient's chronic medical conditions.   Past Medical History:  Diagnosis Date  . Bipolar depression ([place]) 09/2015  . GERD (gastroesophageal reflux disease)   . History of patellar fracture 2016  . History of tibial fracture 2016  . Hyperlipidemia   . Night terrors   . Night terrors, adult 09/2015   Review of Systems:   Review of Systems  Gastrointestinal: Negative for abdominal pain, constipation, diarrhea, nausea and vomiting.  Musculoskeletal: Positive for joint pain and myalgias.  Skin: Positive for itching and rash.   Physical Exam:  Constitution: NAD, well-developed Respiratory: CTAB Abdominal: NTTP, soft Skin: hands bilaterally shiny and dry with few areas of cracked skin            Vitals:   06/22/18 0935  BP: (!) 143/82  Pulse: (!) 102  Temp: 99.3 F (37.4 C)  TempSrc: Oral  SpO2: 98%  Weight: 188 lb 14.4 oz (85.7 kg)    Assessment & Plan:   See Encounters Tab for problem based charting.  Patient seen with Dr. Lynnae January

## 2018-06-23 NOTE — Progress Notes (Signed)
Internal Medicine Clinic Attending  I saw and evaluated the patient.  I personally confirmed the key portions of the history and exam documented by Dr. Seawell and I reviewed pertinent patient test results.  The assessment, diagnosis, and plan were formulated together and I agree with the documentation in the resident's note.     

## 2018-06-25 MED FILL — PRAZOSIN 1 MG CAPSULE: 1 | 30 days supply | Qty: 30 | Fill #0

## 2018-06-25 MED FILL — QUETIAPINE FUMARATE 25 MG T: 25 | 30 days supply | Qty: 30 | Fill #0

## 2018-06-29 ENCOUNTER — Other Ambulatory Visit: Payer: Self-pay | Admitting: *Deleted

## 2018-06-29 ENCOUNTER — Other Ambulatory Visit: Payer: Self-pay | Admitting: Internal Medicine

## 2018-06-29 DIAGNOSIS — E785 Hyperlipidemia, unspecified: Secondary | ICD-10-CM

## 2018-06-29 MED FILL — OMEPRAZOLE 20 MG CPDR: 20 | 30 days supply | Qty: 30 | Fill #2

## 2018-06-29 NOTE — Telephone Encounter (Signed)
Received fax from [place] [facility] requesting Rx for atorvastatin be resnt with IM Program in comments. Hubbard Hartshorn, RN, BSN

## 2018-06-30 MED ORDER — ATORVASTATIN CALCIUM 80 MG PO TABS: 80.0000 mg | ORAL_TABLET | Freq: Every day | ORAL | 5 refills | Status: DC

## 2018-06-30 MED FILL — ATORVASTATIN 80 MG TABLET: 80 | 30 days supply | Qty: 30 | Fill #0

## 2018-07-15 ENCOUNTER — Ambulatory Visit (INDEPENDENT_AMBULATORY_CARE_PROVIDER_SITE_OTHER): Payer: Self-pay

## 2018-07-15 ENCOUNTER — Telehealth: Payer: Self-pay

## 2018-07-15 DIAGNOSIS — Z23 Encounter for immunization: Secondary | ICD-10-CM

## 2018-07-15 NOTE — Telephone Encounter (Signed)
Pt presented to clinic and requested influenza vaccine administration.  This RN asked influenza screening questions, pt states she is allergic to Latex, which is noted in her chart.  Pt states after latex exposure, her reaction is a rash.  Pt states she is unable to wear latex gloves and has to wear cloth gloves.  Pt also informs RN many years ago during a pap smear, MD wore latex gloves and pt experienced rash to perineal area.  This RN informed pt that the prefilled influenza vaccine syringes did contain latex in the rubber stopper in the syring plunger and this RN would have to inform attending MD's about pt's allergy and past reaction, but pt might not be able to receive vaccine.  This RN informed Dr. Eppie Gibson and Dr. Daryll Drown of pt's reaction, both MD's agreed pt should not receive influenza vaccine in prefilled syringe w/ rubber/latex stopper.  Pt was notified this clinic would be unable to administer influenza vaccine due to it containing latex.

## 2018-07-15 NOTE — Telephone Encounter (Signed)
Pt sill in clinic, states she is unsure where she would obtain a flu vaccine, requesting influenza vaccine and states she understands risks.  Dr. Daryll Drown and Dr. Eppie Gibson notified and agree pt can receive influenza vaccine if she agrees to stay 66min post-vaccine administration.  Pt notified, pt agreeable, influenza given, no side effects noted, see MAR.  SChaplin, RN,BSN

## 2018-07-22 MED FILL — lamoTRIgine 25 MG TABS: 25 | 30 days supply | Qty: 60 | Fill #2

## 2018-07-31 ENCOUNTER — Telehealth: Payer: Self-pay

## 2018-07-31 NOTE — Telephone Encounter (Signed)
Called [facility] regarding her 3rd health coaching session with [facility].

## 2018-07-31 NOTE — Telephone Encounter (Signed)
Health Coaching 3  Current:  Patient states that she is eating 2 servings of fruit (bananas, grapes, oranges, and yogurt with fruit in it) and 2 servings of vegetables per day (broccoli, greens, asparagus, cabbage, onion, squash, tomatoes, cucumbers, lettuce).  Patient states that she is eating 2 servings of whole grains per day (oatmeal, popcorn, and occasionally bread).  Patient states that she is eating 2-3 servings of fish per week (cod, ocean perch, whiting).  Patient states that she is no longer drinking any sodas.  Patient states that she is drinking 6 bottles of water (16.9 oz each).  Patient states that she is only eating fried food if it's cooked in her air fryer.  Patient states that she is walking in the evening one night per week.  Patient states that she walks and lifts a lot on her job.  Patient is still not smoking.  Patient has lost weight.  Patient is now reading food labels before she buys the food.  New Goals:    Goal #1: Patient states that her goal is to tone and strengthen her body (including abdominals, legs, and arms) by exercising 1-2 days each week either in the gym or by walking in the neighborhood.  Patient states that she is planning to do this on her days off from her job.  Patient states that her timeframe for this goal is Dec. 9, 2019 - December 16, 2018.  Barrier(s) to reaching goal:  Patient states that her right leg may be a barrier to her achieving her goal since it's sore sometimes and she has to stay off of it and let it rest.  Strategies to overcome barrier(s):  Patient states she will choose alternate exercises when her leg is hurting.  Confidence Level for achieving goal (1-10):  Patient states that her confidence level for achieving her goal is 8.  Goal #2: Patient states her goal is to not eat meat for three weeks during the following timeframe:  Dec. 9, 2019 - December 16, 2018.  Patient states that this goal will not be three weeks in a row.  Barrier(s) to  reaching goal:  Patient states cravings for meat may be a barrier for her.  Strategies to overcome barrier(s):  Patient states when she has a craving, she will acknowledge it and then choose to eat a different type of food that she likes.  Confidence Level for achieving goal (1-10):  Patient states that her confidence level for achieving her goal is 7.5.  Navigation:  Patient is aware of a follow-up session, said her schedule changes from week to week.  Patient states that we can call her anytime and she will let us know if she is able to talk.  Time:  59 minutes

## 2018-08-04 MED FILL — ATORVASTATIN 80 MG TABLET: 80 | 30 days supply | Qty: 30 | Fill #1

## 2018-08-05 ENCOUNTER — Other Ambulatory Visit: Payer: Self-pay | Admitting: *Deleted

## 2018-08-05 ENCOUNTER — Telehealth: Payer: Self-pay | Admitting: Internal Medicine

## 2018-08-05 DIAGNOSIS — K219 Gastro-esophageal reflux disease without esophagitis: Secondary | ICD-10-CM

## 2018-08-05 NOTE — Telephone Encounter (Signed)
Pt is requesting more refills on omeprazole (PRILOSEC) 20 MG capsule  Please to [facility], [place] [phone]

## 2018-08-07 MED ORDER — OMEPRAZOLE 20 MG PO CPDR: 20.0000 mg | DELAYED_RELEASE_CAPSULE | Freq: Every day | ORAL | 1 refills | Status: DC

## 2018-08-10 ENCOUNTER — Other Ambulatory Visit: Payer: Self-pay

## 2018-08-10 ENCOUNTER — Other Ambulatory Visit: Payer: Self-pay | Admitting: Internal Medicine

## 2018-08-10 DIAGNOSIS — K219 Gastro-esophageal reflux disease without esophagitis: Secondary | ICD-10-CM

## 2018-08-10 MED ORDER — OMEPRAZOLE 20 MG PO CPDR: 20.0000 mg | DELAYED_RELEASE_CAPSULE | Freq: Every day | ORAL | 1 refills | Status: DC

## 2018-08-10 MED FILL — OMEPRAZOLE 20 MG CPDR: 20 | 30 days supply | Qty: 30 | Fill #0

## 2018-08-10 NOTE — Telephone Encounter (Signed)
Refill Request Please call patient to verify the correct pharmacy.  omeprazole (PRILOSEC) 20 MG capsule  [id number] - [place], [place] - [address] Is noted in the chart but pt stated medication to be filled @ the [facility].

## 2018-08-10 NOTE — Telephone Encounter (Signed)
Pt stated she will call to have rx transferred to [facility]. And [facility] as requested by pt.

## 2018-08-11 ENCOUNTER — Ambulatory Visit: Payer: Self-pay

## 2018-08-23 MED FILL — lamoTRIgine 25 MG TABS: 25 | 30 days supply | Qty: 60 | Fill #3

## 2018-09-08 MED FILL — ATORVASTATIN 80 MG TABLET: 80 | 30 days supply | Qty: 30 | Fill #2

## 2018-09-08 MED FILL — OMEPRAZOLE 20 MG CPDR: 20 | 30 days supply | Qty: 30 | Fill #1

## 2018-09-29 MED FILL — lamoTRIgine 25 MG TABS: 25 | 30 days supply | Qty: 60 | Fill #0

## 2018-10-08 MED FILL — OMEPRAZOLE 20 MG CPDR: 20 | 30 days supply | Qty: 30 | Fill #2

## 2018-10-08 MED FILL — ATORVASTATIN 80 MG TABLET: 80 | 30 days supply | Qty: 30 | Fill #3

## 2018-10-28 MED FILL — lamoTRIgine 25 MG TABS: 25 | 30 days supply | Qty: 60 | Fill #1 | Status: TO

## 2018-11-10 MED FILL — ATORVASTATIN 80 MG TABLET: 80 | 30 days supply | Qty: 30 | Fill #4 | Status: TO

## 2018-11-10 MED FILL — OMEPRAZOLE 20 MG CPDR: 20 | 30 days supply | Qty: 30 | Fill #3 | Status: TO

## 2018-11-12 ENCOUNTER — Encounter: Payer: Self-pay | Admitting: Internal Medicine

## 2018-12-03 MED FILL — OMEPRAZOLE 20 MG CPDR: 20 | 30 days supply | Qty: 30 | Fill #0

## 2018-12-03 MED FILL — lamoTRIgine 25 MG TABS: 25 | 30 days supply | Qty: 60 | Fill #0

## 2018-12-03 MED FILL — ATORVASTATIN 80 MG TABLET: 80 | 30 days supply | Qty: 30 | Fill #0

## 2018-12-24 ENCOUNTER — Other Ambulatory Visit: Payer: Self-pay | Admitting: Internal Medicine

## 2018-12-24 DIAGNOSIS — E785 Hyperlipidemia, unspecified: Secondary | ICD-10-CM

## 2018-12-24 MED FILL — OMEPRAZOLE 20 MG CPDR: 20 | 30 days supply | Qty: 30 | Fill #1

## 2018-12-27 MED ORDER — ATORVASTATIN CALCIUM 80 MG PO TABS: 80.0000 mg | ORAL_TABLET | Freq: Every day | ORAL | 2 refills | Status: DC

## 2018-12-27 MED FILL — ATORVASTATIN 80 MG TABLET: 80 | 30 days supply | Qty: 30 | Fill #0

## 2019-01-07 ENCOUNTER — Encounter: Payer: Self-pay | Admitting: Internal Medicine

## 2019-01-07 ENCOUNTER — Other Ambulatory Visit: Payer: Self-pay | Admitting: Oncology

## 2019-01-07 DIAGNOSIS — M25561 Pain in right knee: Secondary | ICD-10-CM

## 2019-01-07 MED FILL — HYDROCODON-APAP 5-325: 5-325 | 5 days supply | Qty: 15 | Fill #0

## 2019-01-07 MED FILL — lamoTRIgine 25 MG TABS: 25 | 30 days supply | Qty: 60 | Fill #0

## 2019-01-07 NOTE — Assessment & Plan Note (Signed)
Intermittent request for opioid pain medication for chronic right knee pain s/p traumatic patellar/tibial fracture and multiple surgeries. She uses this infrequently. I would like to have in house clinic visit to discuss but due to covid-19 I think it is reasonable to provide a short term course as she cannot follow-up with ortho for injection currently either.   - norco 5-325 q8h prn for 5 days  - follow-up in clinic as able

## 2019-01-15 MED FILL — lamoTRIgine 25 MG TABS: 25 | 30 days supply | Qty: 60 | Fill #0

## 2019-02-11 ENCOUNTER — Other Ambulatory Visit: Payer: Self-pay | Admitting: Internal Medicine

## 2019-02-11 DIAGNOSIS — Z1231 Encounter for screening mammogram for malignant neoplasm of breast: Secondary | ICD-10-CM

## 2019-02-14 ENCOUNTER — Ambulatory Visit: Payer: Self-pay

## 2019-02-15 ENCOUNTER — Other Ambulatory Visit: Payer: Self-pay

## 2019-02-15 ENCOUNTER — Ambulatory Visit: Payer: Medicaid Other | Admitting: Internal Medicine

## 2019-02-15 ENCOUNTER — Encounter: Payer: Self-pay | Admitting: Internal Medicine

## 2019-02-15 VITALS — BP 126/88 | HR 100 | Temp 98.3°F | Ht 63.0 in | Wt 179.8 lb

## 2019-02-15 DIAGNOSIS — G8929 Other chronic pain: Secondary | ICD-10-CM | POA: Diagnosis not present

## 2019-02-15 DIAGNOSIS — Z8781 Personal history of (healed) traumatic fracture: Secondary | ICD-10-CM

## 2019-02-15 DIAGNOSIS — M25561 Pain in right knee: Secondary | ICD-10-CM

## 2019-02-15 DIAGNOSIS — Z716 Tobacco abuse counseling: Secondary | ICD-10-CM

## 2019-02-15 DIAGNOSIS — M12561 Traumatic arthropathy, right knee: Secondary | ICD-10-CM

## 2019-02-15 DIAGNOSIS — R7303 Prediabetes: Secondary | ICD-10-CM | POA: Diagnosis not present

## 2019-02-15 DIAGNOSIS — E785 Hyperlipidemia, unspecified: Secondary | ICD-10-CM

## 2019-02-15 DIAGNOSIS — Z720 Tobacco use: Secondary | ICD-10-CM

## 2019-02-15 DIAGNOSIS — E119 Type 2 diabetes mellitus without complications: Secondary | ICD-10-CM | POA: Diagnosis not present

## 2019-02-15 DIAGNOSIS — K219 Gastro-esophageal reflux disease without esophagitis: Secondary | ICD-10-CM

## 2019-02-15 DIAGNOSIS — R030 Elevated blood-pressure reading, without diagnosis of hypertension: Secondary | ICD-10-CM | POA: Diagnosis not present

## 2019-02-15 LAB — POCT GLYCOSYLATED HEMOGLOBIN (HGB A1C): Hemoglobin A1C: 6.7 % — AB (ref 4.0–5.6)

## 2019-02-15 LAB — GLUCOSE, CAPILLARY: Glucose-Capillary: 121 mg/dL — ABNORMAL HIGH (ref 70–99)

## 2019-02-15 MED ORDER — ATORVASTATIN CALCIUM 80 MG PO TABS: 80.0000 mg | ORAL_TABLET | Freq: Every day | ORAL | 2 refills | Status: DC

## 2019-02-15 MED ORDER — METFORMIN HCL 500 MG PO TABS: 500.0000 mg | ORAL_TABLET | Freq: Two times a day (BID) | ORAL | 11 refills | Status: DC

## 2019-02-15 MED ORDER — CHANTIX STARTING MONTH PAK 0.5 MG X 11 & 1 MG X 42 PO TABS: ORAL_TABLET | ORAL | 0 refills | Status: AC

## 2019-02-15 MED ORDER — VARENICLINE TARTRATE 1 MG PO TABS: 1.0000 mg | ORAL_TABLET | Freq: Two times a day (BID) | ORAL | 0 refills | Status: DC

## 2019-02-15 MED ORDER — HYDROCODONE-ACETAMINOPHEN 5-325 MG PO TABS: 1.0000 | ORAL_TABLET | Freq: Four times a day (QID) | ORAL | 0 refills | Status: DC | PRN

## 2019-02-15 MED ORDER — OMEPRAZOLE 20 MG PO CPDR: 20.0000 mg | DELAYED_RELEASE_CAPSULE | Freq: Every day | ORAL | 1 refills | Status: DC

## 2019-02-15 MED FILL — metFORMIN HCL 500 MG TABS: 500 | 30 days supply | Qty: 60 | Fill #0

## 2019-02-15 MED FILL — OMEPRAZOLE 20 MG CPDR: 20 | 90 days supply | Qty: 90 | Fill #0

## 2019-02-15 MED FILL — CHANTIX STARTING MONTH BOX: 0.5 MG X 11 | 28 days supply | Qty: 53 | Fill #0

## 2019-02-15 MED FILL — ATORVASTATIN 80 MG TABLET: 80 | 30 days supply | Qty: 30 | Fill #0

## 2019-02-15 MED FILL — HYDROCODON-APAP 5-325: 5-325 | 7 days supply | Qty: 30 | Fill #0

## 2019-02-15 NOTE — Progress Notes (Signed)
   CC: right knee pain, pre-diabetes   HPI:  Meghan Wilson is a 58 y.o. female with PMHx listed below who presents for follow-up on her chronic right knee pain and pre-diabetes.   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 She last saw ortho in 05/2018 at which time she got a steroid injection. They also told her that she would likely need a total knee replacement at some point. She has had increased pain the last couple of months due to increased time on her feet at work.   She is also requesting to have her A1C re-checked. When she had it checked last September, it was borderline at 6.5. Since then she has lost approximately 15 pounds and made dietary changes. She denies polyuria, polydipsia, or appetite changes.   Past Medical History:  Diagnosis Date  . Bipolar depression ([place]) 09/2015  . GERD (gastroesophageal reflux disease)   . History of patellar fracture 2016  . History of tibial fracture 2016  . Hyperlipidemia   . Night terrors   . Night terrors, adult 09/2015   Review of Systems:  Review of Systems  All other systems reviewed and are negative.   Physical Exam:  Vitals:   02/15/19 1423  BP: 126/88  Pulse: 100  Temp: 98.3 F (36.8 C)  TempSrc: Oral  SpO2: 99%  Weight: 179 lb 12.8 oz (81.6 kg)  Height: 5\' 3"  (1.6 m)   Physical Exam Constitutional:      General: She is not in acute distress.    Appearance: Normal appearance.  Cardiovascular:     Rate and Rhythm: Normal rate and regular rhythm.  Pulmonary:     Effort: Pulmonary effort is normal.     Breath sounds: Normal breath sounds.  Abdominal:     General: There is no distension.     Palpations: Abdomen is soft.     Tenderness: There is no abdominal tenderness.  Musculoskeletal:     Right knee: She exhibits normal range of motion, no effusion, no deformity, no LCL laxity and no MCL laxity. Tenderness found. Medial  joint line and lateral joint line tenderness noted. No MCL and no LCL tenderness noted.  Neurological:     Mental Status: She is alert.     Assessment & Plan:   See Encounters Tab for problem based charting.  Patient discussed with Dr. Angelia Mould

## 2019-02-15 NOTE — Patient Instructions (Signed)
Meghan Wilson, It was as pleasure meeting you!   Today we discussed your knee pain: I have refilled the pain medication for you to use as needed until you can see the orthopedic doctor. Since you have been seen by them within the last year, it will likely be faster for you to call their office rather than have another referral placed. If this is not the case, please let us know, and I will send one in.   Congratulations on your weight loss! Your blood pressure looks great today. I will check your A1C today to check for Diabetes.   I commend you for wanting to quit smoking. I have sent in a 2 month supply of Chantix. I'd like you to take the medication for about 2 weeks before you stop smoking altogether in order to better your chance of success.   Please know that you are not alone with the stress of this pandemic and violent protests. I am glad you are seeing your counselor at [facility] to get additional help.   Please call with any additional questions or concerns. Otherwise, plan on following up with your PCP in about 3 months.   Take care! Dr. Koleen Distance

## 2019-02-16 ENCOUNTER — Encounter: Payer: Self-pay | Admitting: Internal Medicine

## 2019-02-16 DIAGNOSIS — E669 Obesity, unspecified: Secondary | ICD-10-CM | POA: Insufficient documentation

## 2019-02-16 DIAGNOSIS — E1169 Type 2 diabetes mellitus with other specified complication: Secondary | ICD-10-CM | POA: Insufficient documentation

## 2019-02-16 DIAGNOSIS — E119 Type 2 diabetes mellitus without complications: Secondary | ICD-10-CM | POA: Insufficient documentation

## 2019-02-16 NOTE — Assessment & Plan Note (Signed)
Patient lost approximately 15 lbs since last visit and has been doing well with lifestyle modifications. Will continue to monitor.

## 2019-02-16 NOTE — Assessment & Plan Note (Signed)
Patient has desire to quit smoking. She had successfully quit last year, but has started back due to stresses of COVID-19 pandemic. She is requesting Chantix, as this is what has helped her in the past.  Will prescribe 2 month's supply. Discussed potential side effects and advised her to take for 2 weeks prior to attempting smoking cessation.

## 2019-02-16 NOTE — Assessment & Plan Note (Signed)
A1C 6.7 today. Discussed results and spent time counseling on continued lifestyle modifications, as well as plan to start Metformin.  Return in 3 months for re-check.

## 2019-02-16 NOTE — Assessment & Plan Note (Signed)
Complains of worsening pain due to increased standing and walking on concrete floor at work. Plans to make an appointment to see ortho in the next couple of weeks. She has intermittently used Norco for severe pain at night. Will prescribe 5 day supply until she can be evaluated for further treatment.

## 2019-02-21 NOTE — Progress Notes (Signed)
Internal Medicine Clinic Attending  Case discussed with Dr. Bloomfield at the time of the visit.  We reviewed the resident's history and exam and pertinent patient test results.  I agree with the assessment, diagnosis, and plan of care documented in the resident's note.  

## 2019-02-25 ENCOUNTER — Ambulatory Visit: Payer: Medicaid Other

## 2019-03-01 ENCOUNTER — Other Ambulatory Visit: Payer: Self-pay | Admitting: Internal Medicine

## 2019-03-01 ENCOUNTER — Ambulatory Visit
Admission: RE | Admit: 2019-03-01 | Discharge: 2019-03-01 | Disposition: A | Discharge status: Home / Self Care | Payer: Medicaid Other | Source: Ambulatory Visit | Attending: Obstetrics and Gynecology | Admitting: Obstetrics and Gynecology

## 2019-03-01 ENCOUNTER — Other Ambulatory Visit: Payer: Self-pay

## 2019-03-01 DIAGNOSIS — Z1231 Encounter for screening mammogram for malignant neoplasm of breast: Secondary | ICD-10-CM

## 2019-03-14 MED FILL — lamoTRIgine 25 MG TABS: 25 | 30 days supply | Qty: 60 | Fill #0

## 2019-03-14 MED FILL — ATORVASTATIN 80 MG TABLET: 80 | 30 days supply | Qty: 30 | Fill #1

## 2019-03-14 MED FILL — metFORMIN HCL 500 MG TABS: 500 | 30 days supply | Qty: 60 | Fill #1

## 2019-03-15 ENCOUNTER — Ambulatory Visit (INDEPENDENT_AMBULATORY_CARE_PROVIDER_SITE_OTHER): Payer: Medicaid Other

## 2019-03-15 ENCOUNTER — Encounter: Payer: Self-pay | Admitting: Family

## 2019-03-15 ENCOUNTER — Ambulatory Visit (INDEPENDENT_AMBULATORY_CARE_PROVIDER_SITE_OTHER): Payer: Medicaid Other | Admitting: Family

## 2019-03-15 ENCOUNTER — Other Ambulatory Visit: Payer: Self-pay

## 2019-03-15 ENCOUNTER — Ambulatory Visit: Payer: Self-pay

## 2019-03-15 DIAGNOSIS — M79604 Pain in right leg: Secondary | ICD-10-CM | POA: Diagnosis not present

## 2019-03-15 DIAGNOSIS — G8929 Other chronic pain: Secondary | ICD-10-CM

## 2019-03-15 DIAGNOSIS — M25561 Pain in right knee: Secondary | ICD-10-CM | POA: Diagnosis not present

## 2019-03-15 MED ORDER — PREDNISONE 50 MG PO TABS: ORAL_TABLET | ORAL | 0 refills | Status: DC

## 2019-03-15 MED FILL — predniSONE 50 MG TABS: 50 | 5 days supply | Qty: 5 | Fill #0

## 2019-03-21 ENCOUNTER — Other Ambulatory Visit: Payer: Self-pay

## 2019-03-21 ENCOUNTER — Encounter (HOSPITAL_COMMUNITY): Payer: Self-pay | Admitting: Emergency Medicine

## 2019-03-21 ENCOUNTER — Emergency Department (HOSPITAL_COMMUNITY): Payer: Medicaid Other

## 2019-03-21 ENCOUNTER — Emergency Department (HOSPITAL_COMMUNITY)
Admission: EM | Admit: 2019-03-21 | Discharge: 2019-03-21 | Disposition: A | Discharge status: Home / Self Care | Payer: Medicaid Other | Attending: Emergency Medicine | Admitting: Emergency Medicine

## 2019-03-21 DIAGNOSIS — Z87891 Personal history of nicotine dependence: Secondary | ICD-10-CM | POA: Insufficient documentation

## 2019-03-21 DIAGNOSIS — Z79899 Other long term (current) drug therapy: Secondary | ICD-10-CM | POA: Diagnosis not present

## 2019-03-21 DIAGNOSIS — E119 Type 2 diabetes mellitus without complications: Secondary | ICD-10-CM | POA: Diagnosis not present

## 2019-03-21 DIAGNOSIS — Y9289 Other specified places as the place of occurrence of the external cause: Secondary | ICD-10-CM | POA: Insufficient documentation

## 2019-03-21 DIAGNOSIS — Y939 Activity, unspecified: Secondary | ICD-10-CM | POA: Insufficient documentation

## 2019-03-21 DIAGNOSIS — Z7984 Long term (current) use of oral hypoglycemic drugs: Secondary | ICD-10-CM | POA: Diagnosis not present

## 2019-03-21 DIAGNOSIS — Z91040 Latex allergy status: Secondary | ICD-10-CM | POA: Insufficient documentation

## 2019-03-21 DIAGNOSIS — S6992XA Unspecified injury of left wrist, hand and finger(s), initial encounter: Secondary | ICD-10-CM | POA: Diagnosis not present

## 2019-03-21 DIAGNOSIS — Y999 Unspecified external cause status: Secondary | ICD-10-CM | POA: Insufficient documentation

## 2019-03-21 DIAGNOSIS — W1789XA Other fall from one level to another, initial encounter: Secondary | ICD-10-CM | POA: Insufficient documentation

## 2019-03-21 MED ORDER — HYDROCODONE-ACETAMINOPHEN 5-325 MG PO TABS: 1.0000 | ORAL_TABLET | Freq: Four times a day (QID) | ORAL | 0 refills | Status: DC | PRN

## 2019-03-21 MED ORDER — ACETAMINOPHEN 500 MG PO TABS
1000.0000 mg | ORAL_TABLET | Freq: Once | ORAL | Status: AC
  Administered 2019-03-21: 1000 mg via ORAL
  Filled 2019-03-21: qty 2

## 2019-03-21 MED FILL — HYDROCODON-APAP 5-325: 5-325 | 1 days supply | Qty: 6 | Fill #0

## 2019-03-21 NOTE — ED Provider Notes (Signed)
[facility] EMERGENCY DEPARTMENT Provider Note   CSN: [id number] Arrival date & time: 03/21/19  4081     History   Chief Complaint Chief Complaint  Patient presents with  . Wrist Pain  . Hand Pain    HPI Meghan Wilson is a 58 y.o. female.     HPI  Patient is a 58 yo female with a PMH of bipolar disorder, T2 DM, GERD, right patellar fracture, HLD presenting for left wrist pain and right knee pain after fall from step stool yesterday evening. No head injury or LOC. Patient thinks that she fell on an outstretched hand. She reports no significant pain last night but woke up in the night with increasing pain in the left wrist and it is now swollen. No loss of range of motion. Right knee with some medial pain. No significant swelling. She has been ambulating on it. No loss of sensation distally.  Past Medical History:  Diagnosis Date  . Bipolar depression ([place]) 09/2015  . GERD (gastroesophageal reflux disease)   . History of patellar fracture 2016  . History of tibial fracture 2016  . Hyperlipidemia   . Night terrors   . 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 Type II diabetes mellitus ([place]) 02/16/2019  . Elevated BP without diagnosis of hypertension 05/06/2018  . Pain of right knee after injury 03/10/2018  . Atopic dermatitis 02/15/2018  . 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 Encounter for smoking cessation counseling 01/02/2018  . Hyperlipidemia 01/01/2018  . Peptic gastritis 01/01/2018  . Morbid obesity ([place]) 08/03/2015  . Bipolar disorder ([place]) 08/03/2015    Past Surgical History:  Procedure Laterality Date  . CHOLECYSTECTOMY    . JOINT REPLACEMENT       OB History    Gravida  3   Para      Term      Preterm      AB  1   Living  2     SAB  1   TAB      Ectopic      Multiple      Live Births  2            Home Medications    Prior to Admission  medications   Medication Sig Start Date End Date Taking? Authorizing Provider  atorvastatin (LIPITOR) 80 MG tablet Take 1 tablet (80 mg total) by mouth daily. 02/15/19   Bloomfield, Carley D, DO  fluticasone (VERAMYST) 27.5 MCG/SPRAY nasal spray Place 2 sprays into the nose daily.    [provider]  HYDROcodone-acetaminophen (NORCO/VICODIN) 5-325 MG tablet Take 1 tablet by mouth every 6 (six) hours as needed. 03/21/19   Langston Masker B, PA-C  lamoTRIgine (LAMICTAL) 25 MG tablet TAKE TWO TABLETS ORALLY EVERY MORNING 12/10/17   [provider]  metFORMIN (GLUCOPHAGE) 500 MG tablet Take 1 tablet (500 mg total) by mouth 2 (two) times daily with a meal. Take 1 tablet daily for first week, then 2 times daily thereafter. 02/15/19 02/15/20  Bloomfield, Nila Nephew D, DO  omeprazole (PRILOSEC) 20 MG capsule Take 1 capsule (20 mg total) by mouth daily. 02/15/19   Bloomfield, Carley D, DO  prazosin (MINIPRESS) 1 MG capsule TAKE 1 CAPSULE DAILY AT NIGHT, INCREASE DOSE TO 2 MG AT NIGHT AFTER ONE WEEK 12/05/17   [provider]  predniSONE (DELTASONE) 50 MG tablet Take one tablet by mouth once daily  for 5 days 03/15/19   Suzan Slick, NP  triamcinolone ointment (KENALOG) 0.5 % Apply to areas of hand with rash twice daily. 02/15/18   Lacroce, Hulen Shouts, MD  varenicline (CHANTIX CONTINUING MONTH PAK) 1 MG tablet Take 1 tablet (1 mg total) by mouth 2 (two) times daily. 03/18/19   [place] Nunnery D, DO    Family History Family History  Problem Relation Age of Onset  . Hyperlipidemia Mother   . Hypertension Mother   . Arthritis/Rheumatoid Father   . Hyperlipidemia Maternal Aunt   . Hypertension Maternal Aunt   . Cancer Maternal Aunt     Social History Social History   Tobacco Use  . Smoking status: Former Smoker    Packs/day: 0.25    Years: 4.00    Pack years: 1.00    Quit date: 12/21/2017    Years since quitting: 1.2  . Smokeless tobacco: Never Used  Substance Use Topics  .  Alcohol use: Yes    Comment: special occasions  . Drug use: No     Allergies   Depakote [divalproex sodium], Ibuprofen, and Latex   Review of Systems Review of Systems  Musculoskeletal: Positive for arthralgias, joint swelling and myalgias.  Skin: Negative for wound.  Neurological: Negative for weakness and numbness.     Physical Exam Updated Vital Signs BP 132/89 (BP Location: Right Arm)   Pulse 99   Temp 98.9 F (37.2 C) (Oral)   Resp 18   SpO2 100%   Physical Exam Vitals signs and nursing note reviewed.  Constitutional:      General: She is not in acute distress.    Appearance: She is well-developed. She is not diaphoretic.     Comments: Sitting comfortably in bed.  HENT:     Head: Normocephalic and atraumatic.  Eyes:     General:        Right eye: No discharge.        Left eye: No discharge.     Conjunctiva/sclera: Conjunctivae normal.     Comments: EOMs normal to gross examination.  Neck:     Musculoskeletal: Normal range of motion.  Cardiovascular:     Rate and Rhythm: Normal rate and regular rhythm.     Comments: Intact, 2+ left radial pulse. Pulmonary:     Comments: Converses comfortably. No audible wheeze or stridor. Abdominal:     General: There is no distension.  Musculoskeletal:        General: Swelling and tenderness present.     Comments: Left Hand Exam:  Inspection: Mild swelling over dorsum of left hand. Palpation: TTP of anatomic snuffbox and distal radius. ROM: Passive/active ROM intact at wrist, MCP, PIP, and DIP joints, thumb MCP and IP joints, and no rotational deformity of metacarpals noted. Ligamentous stability: No laxity to valgus/varus stress of MCP, PIP, or DIP joints. No joint laxity with radial stress of thumb. Flexor/Extensor tendons: FDS/FDP tendons intact in digits 2-5 at PIP/DIP joints, respectively; extensor tendons intact in all digits Nerve testing:  Full sensation intact to all distal fingers of left hand Vascular: 2+  radial and ulnar pulses. Capillary refill <2 seconds b/l.  No proximal radius and ulnar tenderness of LUE. No TTP of medial/lateral malleolus, or olecranon.  Right knee with tenderness to palpation of medial knee. Full ROM. No joint line tenderness. No joint effusion or swelling appreciated. No abnormal alignment or patellar mobility. No bruising, erythema or warmth overlaying the joint. No varus/valgus laxity. Negative drawer's, Lachman's and McMurray's.  No crepitus.  2+ DP pulses bilaterally. All compartments are soft. Sensation intact distal to injury.   Skin:    General: Skin is warm and dry.  Neurological:     Mental Status: She is alert.     Comments: Cranial nerves intact to gross observation. Patient moves extremities without difficulty.  Psychiatric:        Behavior: Behavior normal.        Thought Content: Thought content normal.        Judgment: Judgment normal.      ED Treatments / Results  Labs (all labs ordered are listed, but only abnormal results are displayed) Labs Reviewed - No data to display  EKG None  Radiology Dg Wrist Complete Left  Result Date: 03/21/2019 CLINICAL DATA:  The patient suffered a left wrist injury last night when she fell off a ladder. Pain. Initial encounter. EXAM: LEFT WRIST - COMPLETE 3+ VIEW COMPARISON:  None. FINDINGS: There is no evidence of fracture or dislocation. There is no evidence of arthropathy or other focal bone abnormality. Soft tissues are unremarkable. IMPRESSION: Negative exam. Electronically Signed   By: Inge Rise M.D.   On: 03/21/2019 09:03   Dg Knee Complete 4 Views Right  Result Date: 03/21/2019 CLINICAL DATA:  Fall. EXAM: RIGHT KNEE - COMPLETE 4+ VIEW COMPARISON:  03/15/2019. FINDINGS: Plate screw fixation of proximal tibia noted. Hardware intact. Anatomic alignment. Diffuse osteopenia. No evidence of acute fracture or dislocation. Peripheral vascular calcification. Tiny knee joint effusion. IMPRESSION: 1. Plate  screw fixation of the proximal tibia. Hardware intact. Anatomic alignment. 2. Diffuse osteopenia degenerative change. No acute bony abnormality. 3.  Small knee joint effusion. Electronically Signed   By: Marcello Moores  Register   On: 03/21/2019 09:03   Dg Hand Complete Left  Result Date: 03/21/2019 CLINICAL DATA:  Left hand and wrist pain since an injury the patient suffered when she fell off a ladder last night. Initial encounter. EXAM: LEFT HAND - COMPLETE 3+ VIEW COMPARISON:  None. FINDINGS: There is no evidence of fracture or dislocation. 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 Soft tissues are unremarkable. IMPRESSION: No acute abnormality. Electronically Signed   By: Inge Rise M.D.   On: 03/21/2019 09:02    Procedures Procedures (including critical care time)  Medications Ordered in ED Medications  acetaminophen (TYLENOL) tablet 1,000 mg (1,000 mg Oral Given 03/21/19 0834)     Initial Impression / Assessment and Plan / ED Course  I have reviewed the triage vital signs and the nursing notes.  Pertinent labs & imaging results that were available during my care of the patient were reviewed by me and considered in my medical decision making (see chart for details).        This is a well appearing 58 yo patient with a PMH of T2 DM, bipolar disorder, HTN, HLD, presenting for left wrist and left knee injury. Neurovascularly intact on exam. Films reviewed by me with no evidence of acute fracture in distal radius, scaphoid. Hardware intact in right knee. Discussed with patient that a negative xray does not preclude scaphoid fracture. Patient immobilized in thumb spica splint. She has a previous relationship with [facility] and she was instructed to follow up in one week for repeat film. Return precautions given for any increasing pain, pallor, paresthesias of LUE. Nursing notes reviewed. Vital signs reviewed. All questions answered  by patient.  I have reviewed the patient's information in the [place] Cont[place] Substance Database for the past 12 months and found them to have no overlapping Rx.  Opiates were prescribed for an acute, painful condition. The patient was given information on side effects and encouraged to use other, non-opiate pain medication primary, only using opiate medicine sparingly for severe pain.  Final Clinical Impressions(s) / ED Diagnoses   Final diagnoses:  Injury of left wrist, initial encounter    ED Discharge Orders         Ordered    HYDROcodone-acetaminophen (NORCO/VICODIN) 5-325 MG tablet  Every 6 hours PRN     03/21/19 0938    Ambulatory referral to Orthopedic Surgery    Comments: Pt is a pt of Dr. Jess Barters previously for knee surgery, now has left wrist injury. Would like to obtain follow up. Thank you.   03/21/19 [place], [place], PA-C 03/21/19 1730    Julianne Rice, MD 03/22/19 [phone]

## 2019-03-21 NOTE — Discharge Instructions (Signed)
Please read and follow all provided instructions.  Your diagnoses today include:  1. Injury of left wrist, initial encounter     Tests performed today include: An x-ray of your wrist - does NOT show any broken bones, however sometimes a fracture does not show up on the first xray, so you will need follow up imaging if pain continues.  Vital signs. See below for your results today.   Medications prescribed:   Take any prescribed medications only as directed.  You have been prescribed Norco for pain. This is an opioid pain medication. You may take this medication every 4-6 hours as needed for pain. Only take this medication if you need it for breakthrough pain. You may combine this medicine with ibuprofen, a non-steroidal anti-inflammatory drug (NSAID) every 6 hours, so you are getting something for pain relief every 3 hours.  Do not combine this medication with Tylenol, as it may increase the risk of liver problems.  Do not combine this medication with alcohol.  Please be advised to avoid driving or operating heavy machinery while taking this medication, as it may make you drowsy or impair judgment.    Home care instructions:  Follow any educational materials contained in this packet Wear your splint for at least one week or until seen by a physician for a follow-up examination. Follow R.I.C.E. Protocol: R - rest your injury  I  - use ice on injury without applying directly to skin C - compress injury with bandage or splint E - elevate the injury above the level of your heart as much as possible to reduce pain and swelling  Follow-up instructions: Please follow-up with your primary care provider or the provided orthopedic (bone specialist) if you continue to have significant pain or trouble using your wrist in 1 week. In this case you may have a severe injury that requires further care.   Generally, when wrists are moderately tender to touch following a fall or injury, a fracture  (break in bone) may be present. Because of this, even if your x-rays were normal today, it is important that you receive follow-up care as suggested (you could still have a broken bone).  Return instructions:  Please return if your fingers are numb or tingling, appear very red, white, gray or blue, or you have severe pain (also elevate wrist and loosen splint or wrap) Please return if you have difficulty moving your fingers. Please return to the Emergency Department if you experience worsening symptoms.  Please return if you have any other emergent concerns.  Additional Information:  Your vital signs today were: BP 132/89 (BP Location: Right Arm)    Pulse 99    Temp 98.9 F (37.2 C) (Oral)    Resp 18    SpO2 100%  If your blood pressure (BP) was elevated above 135/85 this visit, please have this repeated by your doctor within one month. -------------- Wrist injuries are frequent in adults and children. A sprain is an injury to the ligaments that hold your bones together. A strain is an injury to muscle or muscle tendons (cord like structure) from stretching or pulling.   Remember the importance of follow-up and possible follow-up x-rays. Improvement in pain level is not 100% insurance of not having a fracture. --------------

## 2019-03-21 NOTE — ED Triage Notes (Signed)
Pt arrives to ED from home with complaints of left arm/wrist pain after falling of a ladder yesterday while changing a lightbulb. Pt states she also has mild pain in her right knee and left shoulder. Pt denies any LOC or injury to head or face.

## 2019-03-21 NOTE — ED Notes (Signed)
Patient verbalizes understanding of discharge instructions. Opportunity for questioning and answers were provided. Armband removed by staff, pt discharged from ED.  

## 2019-03-22 ENCOUNTER — Encounter: Payer: Self-pay | Admitting: Family

## 2019-03-22 NOTE — Progress Notes (Signed)
Office Visit Note   Patient: Meghan Wilson           Date of Birth: 21-Jan-1961           MRN: [id number] Visit Date: 03/15/2019              Requested by: Molli Hazard A, DO [address],  [place] [address] PCP: Marty Heck, DO  Chief Complaint  Patient presents with  . Right Knee - Pain      HPI: The patient is a 58 year old woman seen today for evaluation of right knee pain.  She has had history of cortisone injections for knee difficulties in the past.  The pain she is experiencing today is predominantly comes on with sitting for long periods of time describes it as a numbness that radiates from her buttocks down to her knee.  The posterior thigh pain this is sometimes sharp and stabbing.  She feels occasional weakness in her right foot as well this pain is radiating from her ankle up to her knee.  Denies any recent injury no loss of bowel or bladder.  Assessment & Plan: Visit Diagnoses:  1. Chronic pain of right knee   2. Pain in right leg     Plan: We will trial her on a prednisone course.  Discussed that if we cannot get any relief of her symptoms with this we may refer her to Dr. Ernestina Patches for [facility]  Consider MRI of her lumbar spine in the future.  Follow-Up Instructions: Return in about 4 weeks (around 04/12/2019).   Back Exam   Tenderness  The patient is experiencing tenderness in the lumbar.  Range of Motion  The patient has normal back ROM.  Muscle Strength  The patient has normal back strength.  Tests  Straight leg raise right: positive Straight leg raise left: negative  Other  Gait: normal       Patient is alert, oriented, no adenopathy, well-dressed, normal affect, normal respiratory effort.   Imaging: No results found. No images are attached to the encounter.  Labs: Lab Results  Component Value Date   HGBA1C 6.7 (A) 02/15/2019   HGBA1C 6.5 (H) 04/02/2018     Lab Results  Component Value Date   ALBUMIN 4.5 01/01/2018     No results found for: MG No results found for: VD25OH  No results found for: PREALBUMIN CBC EXTENDED Latest Ref Rng & Units 01/01/2018  WBC 3.4 - 10.8 x10E3/uL 8.0  RBC 3.77 - 5.28 x10E6/uL 4.96  HGB 11.1 - 15.9 g/dL 13.3  HCT 34.0 - 46.6 % 41.7  PLT 150 - 379 x10E3/uL 340     There is no height or weight on file to calculate BMI.  Orders:  Orders Placed This Encounter  Procedures  . XR Knee 1-2 Views Right  . XR Lumbar Spine 2-3 Views   Meds ordered this encounter  Medications  . predniSONE (DELTASONE) 50 MG tablet    Sig: Take one tablet by mouth once daily for 5 days    Dispense:  5 tablet    Refill:  0     Procedures: No procedures performed  Clinical Data: No additional findings.  ROS:  All other systems negative, except as noted in the HPI. Review of Systems  Constitutional: Negative for chills and fever.  Musculoskeletal: Positive for arthralgias and back pain.  Neurological: Positive for weakness and numbness.    Objective: Vital Signs: There were no vitals taken for this visit.  Specialty Comments:  No specialty comments available.  PMFS History: Patient Active Problem List   Diagnosis Date Noted  . Type II diabetes mellitus ([place]) 02/16/2019  . Elevated BP without diagnosis of hypertension 05/06/2018  . Pain of right knee after injury 03/10/2018  . Atopic dermatitis 02/15/2018  . 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 Encounter for smoking cessation counseling 01/02/2018  . Hyperlipidemia 01/01/2018  . Peptic gastritis 01/01/2018  . Morbid obesity ([place]) 08/03/2015  . Bipolar disorder ([place]) 08/03/2015   Past Medical History:  Diagnosis Date  . Bipolar depression ([place]) 09/2015  . GERD (gastroesophageal reflux disease)   . History of patellar fracture 2016  . History of tibial fracture 2016  . Hyperlipidemia   . Night terrors   . Night terrors, adult 09/2015    Family History  Problem Relation Age of Onset  .  Hyperlipidemia Mother   . Hypertension Mother   . Arthritis/Rheumatoid Father   . Hyperlipidemia Maternal Aunt   . Hypertension Maternal Aunt   . Cancer Maternal Aunt     Past Surgical History:  Procedure Laterality Date  . CHOLECYSTECTOMY    . JOINT REPLACEMENT     Social History   Occupational History  . Not on file  Tobacco Use  . Smoking status: Former Smoker    Packs/day: 0.25    Years: 4.00    Pack years: 1.00    Quit date: 12/21/2017    Years since quitting: 1.2  . Smokeless tobacco: Never Used  Substance and Sexual Activity  . Alcohol use: Yes    Comment: special occasions  . Drug use: No  . Sexual activity: Yes    Birth control/protection: None    Comment: Celibate

## 2019-03-29 ENCOUNTER — Encounter: Payer: Self-pay | Admitting: Orthopedic Surgery

## 2019-03-29 ENCOUNTER — Ambulatory Visit (INDEPENDENT_AMBULATORY_CARE_PROVIDER_SITE_OTHER): Payer: Medicaid Other | Admitting: Orthopedic Surgery

## 2019-03-29 VITALS — Ht 63.0 in | Wt 179.0 lb

## 2019-03-29 DIAGNOSIS — M1731 Unilateral post-traumatic osteoarthritis, right knee: Secondary | ICD-10-CM

## 2019-03-29 DIAGNOSIS — M545 Low back pain, unspecified: Secondary | ICD-10-CM

## 2019-03-29 DIAGNOSIS — M25531 Pain in right wrist: Secondary | ICD-10-CM | POA: Diagnosis not present

## 2019-03-29 MED ORDER — PREDNISONE 10 MG PO TABS: 20.0000 mg | ORAL_TABLET | Freq: Every day | ORAL | 0 refills | Status: DC

## 2019-03-29 MED FILL — predniSONE 10 MG TABS: 10 | 30 days supply | Qty: 60 | Fill #0

## 2019-03-29 NOTE — Progress Notes (Signed)
Office Visit Note   Patient: Meghan Wilson           Date of Birth: Oct 07, 1960           MRN: [id number] Visit Date: 03/29/2019              Requested by: Molli Hazard A, DO [address] [place],   [id number] PCP: Marty Heck, DO  Chief Complaint  Patient presents with  . Left Wrist - Pain    03/21/19 left wrist pain s/p fall      HPI: Patient is a 58 year old woman who recently fell from a stool she went to the emergency room on the 20th she had radiographs of her wrist and knee and she has also had back x-rays recently.  She has had a steroid injection of the right knee for traumatic arthritis within the past week.  She is currently wearing a Velcro splint on the left wrist complains of chronic right knee pain he complains of lower back pain with radiating pain to her right great toe.  She states the radicular pain is not related to her fall.  Assessment & Plan: Visit Diagnoses:  1. Unilateral post-traumatic osteoarthritis, right knee   2. Low back pain radiating to right leg   3. Pain in right wrist     Plan: A prescription was called in for prednisone 20 mg to take with breakfast she will wean down to 10 mg and then take this every other day and wean off as she feels comfortable.  Discussed that this will increase her blood sugars.  Follow-Up Instructions: Return in about 3 weeks (around 04/19/2019).   Ortho Exam  Patient is alert, oriented, no adenopathy, well-dressed, normal affect, normal respiratory effort. Examination patient has a negative straight leg raise of the right leg with no focal motor weakness.  Examination left wrist the scaphoid scapholunate and TFCC are nontender to palpation she has good function of extensor and flexor tendons.  Examination of the right knee she does have global tenderness palpation around the right knee but no focal laxity or injury no swelling no abrasions.  Radiographs of the lumbar spine were reviewed which show  degenerative disc disease radiographs of the right knee shows traumatic arthritis status post tibial plateau internal fixation and radiographs of the left wrist shows no bony abnormalities.  Imaging: No results found. No images are attached to the encounter.  Labs: Lab Results  Component Value Date   HGBA1C 6.7 (A) 02/15/2019   HGBA1C 6.5 (H) 04/02/2018     Lab Results  Component Value Date   ALBUMIN 4.5 01/01/2018    No results found for: MG No results found for: VD25OH  No results found for: PREALBUMIN CBC EXTENDED Latest Ref Rng & Units 01/01/2018  WBC 3.4 - 10.8 x10E3/uL 8.0  RBC 3.77 - 5.28 x10E6/uL 4.96  HGB 11.1 - 15.9 g/dL 13.3  HCT 34.0 - 46.6 % 41.7  PLT 150 - 379 x10E3/uL 340     Body mass index is 31.71 kg/m.  Orders:  No orders of the defined types were placed in this encounter.  Meds ordered this encounter  Medications  . predniSONE (DELTASONE) 10 MG tablet    Sig: Take 2 tablets (20 mg total) by mouth daily with breakfast.    Dispense:  60 tablet    Refill:  0     Procedures: No procedures performed  Clinical Data: No additional findings.  ROS:  All other systems negative,  except as noted in the HPI. Review of Systems  Objective: Vital Signs: Ht 5\' 3"  (1.6 m)   Wt 179 lb (81.2 kg)   BMI 31.71 kg/m   Specialty Comments:  No specialty comments available.  PMFS History: Patient Active Problem List   Diagnosis Date Noted  . Type II diabetes mellitus ([place]) 02/16/2019  . Elevated BP without diagnosis of hypertension 05/06/2018  . Pain of right knee after injury 03/10/2018  . Atopic dermatitis 02/15/2018  . 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 Encounter for smoking cessation counseling 01/02/2018  . Hyperlipidemia 01/01/2018  . Peptic gastritis 01/01/2018  . Morbid obesity ([place]) 08/03/2015  . Bipolar disorder ([place]) 08/03/2015   Past Medical History:  Diagnosis Date  . Bipolar depression ([place]) 09/2015  . GERD  (gastroesophageal reflux disease)   . History of patellar fracture 2016  . History of tibial fracture 2016  . Hyperlipidemia   . Night terrors   . Night terrors, adult 09/2015    Family History  Problem Relation Age of Onset  . Hyperlipidemia Mother   . Hypertension Mother   . Arthritis/Rheumatoid Father   . Hyperlipidemia Maternal Aunt   . Hypertension Maternal Aunt   . Cancer Maternal Aunt     Past Surgical History:  Procedure Laterality Date  . CHOLECYSTECTOMY    . JOINT REPLACEMENT     Social History   Occupational History  . Not on file  Tobacco Use  . Smoking status: Former Smoker    Packs/day: 0.25    Years: 4.00    Pack years: 1.00    Quit date: 12/21/2017    Years since quitting: 1.2  . Smokeless tobacco: Never Used  Substance and Sexual Activity  . Alcohol use: Yes    Comment: special occasions  . Drug use: No  . Sexual activity: Yes    Birth control/protection: None    Comment: Celibate

## 2019-04-13 ENCOUNTER — Ambulatory Visit: Payer: Medicaid Other

## 2019-04-13 MED FILL — lamoTRIgine 25 MG TABS: 25 | 30 days supply | Qty: 60 | Fill #1

## 2019-04-18 MED FILL — hydrOXYzine HCL 10 MG TABS: 10 | 30 days supply | Qty: 60 | Fill #0

## 2019-04-18 MED FILL — lamoTRIgine 100 MG TABS: 100 | 30 days supply | Qty: 30 | Fill #0

## 2019-04-21 ENCOUNTER — Other Ambulatory Visit: Payer: Self-pay

## 2019-04-21 ENCOUNTER — Ambulatory Visit (INDEPENDENT_AMBULATORY_CARE_PROVIDER_SITE_OTHER): Payer: Medicaid Other | Admitting: Orthopedic Surgery

## 2019-04-21 DIAGNOSIS — M545 Low back pain: Secondary | ICD-10-CM | POA: Diagnosis not present

## 2019-04-21 DIAGNOSIS — M25532 Pain in left wrist: Secondary | ICD-10-CM | POA: Diagnosis not present

## 2019-04-21 DIAGNOSIS — M1731 Unilateral post-traumatic osteoarthritis, right knee: Secondary | ICD-10-CM

## 2019-04-21 DIAGNOSIS — M79604 Pain in right leg: Secondary | ICD-10-CM

## 2019-04-24 ENCOUNTER — Encounter: Payer: Self-pay | Admitting: Orthopedic Surgery

## 2019-04-24 NOTE — Progress Notes (Signed)
Office Visit Note   Patient: Meghan Wilson           Date of Birth: February 16, 1961           MRN: [id number] Visit Date: 04/21/2019              Requested by: Molli Hazard A, DO [address],  [place] [address] PCP: Marty Heck, DO  Chief Complaint  Patient presents with  . Left Wrist - Follow-up  . Right Knee - Follow-up      HPI: Patient is a 58 year old woman who presents complaining of lower back pain with radicular symptoms down both legs.  She states that the left wrist and right knee pain have improved with taking the prednisone 20 mg in the morning and the back and radicular symptoms have also improved.  She states she still has some aching but feels much better overall.  She has a callus beneath the right great toe.  Assessment & Plan: Visit Diagnoses:  1. Unilateral post-traumatic osteoarthritis, right knee   2. Low back pain radiating to right leg   3. Pain in left wrist     Plan: The callus was pared recommended Achilles stretching and stiff soled sneakers she will decrease her prednisone to 10 mg in the morning and then wean off by taking this every other day.  Follow-Up Instructions: Return if symptoms worsen or fail to improve.   Ortho Exam  Patient is alert, oriented, no adenopathy, well-dressed, normal affect, normal respiratory effort. Examination patient has a negative straight leg raise bilaterally no focal motor weakness in either lower extremity her wrist and knee are not symptomatic.  She does have a callus beneath the right great toe due to the Achilles tightness.  The callus was pared she was given instructions and demonstrated Achilles stretching  Imaging: No results found. No images are attached to the encounter.  Labs: Lab Results  Component Value Date   HGBA1C 6.7 (A) 02/15/2019   HGBA1C 6.5 (H) 04/02/2018     Lab Results  Component Value Date   ALBUMIN 4.5 01/01/2018    No results found for: MG No results found for:  VD25OH  No results found for: PREALBUMIN CBC EXTENDED Latest Ref Rng & Units 01/01/2018  WBC 3.4 - 10.8 x10E3/uL 8.0  RBC 3.77 - 5.28 x10E6/uL 4.96  HGB 11.1 - 15.9 g/dL 13.3  HCT 34.0 - 46.6 % 41.7  PLT 150 - 379 x10E3/uL 340     There is no height or weight on file to calculate BMI.  Orders:  No orders of the defined types were placed in this encounter.  No orders of the defined types were placed in this encounter.    Procedures: No procedures performed  Clinical Data: No additional findings.  ROS:  All other systems negative, except as noted in the HPI. Review of Systems  Objective: Vital Signs: There were no vitals taken for this visit.  Specialty Comments:  No specialty comments available.  PMFS History: Patient Active Problem List   Diagnosis Date Noted  . Type II diabetes mellitus ([place]) 02/16/2019  . Elevated BP without diagnosis of hypertension 05/06/2018  . Pain of right knee after injury 03/10/2018  . Atopic dermatitis 02/15/2018  . 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 Encounter for smoking cessation counseling 01/02/2018  . Hyperlipidemia 01/01/2018  . Peptic gastritis 01/01/2018  . Morbid obesity ([place]) 08/03/2015  . Bipolar disorder ([place]) 08/03/2015   Past Medical  History:  Diagnosis Date  . Bipolar depression ([place]) 09/2015  . GERD (gastroesophageal reflux disease)   . History of patellar fracture 2016  . History of tibial fracture 2016  . Hyperlipidemia   . Night terrors   . Night terrors, adult 09/2015    Family History  Problem Relation Age of Onset  . Hyperlipidemia Mother   . Hypertension Mother   . Arthritis/Rheumatoid Father   . Hyperlipidemia Maternal Aunt   . Hypertension Maternal Aunt   . Cancer Maternal Aunt     Past Surgical History:  Procedure Laterality Date  . CHOLECYSTECTOMY    . JOINT REPLACEMENT     Social History   Occupational History  . Not on file  Tobacco Use  . Smoking status: Former  Smoker    Packs/day: 0.25    Years: 4.00    Pack years: 1.00    Quit date: 12/21/2017    Years since quitting: 1.3  . Smokeless tobacco: Never Used  Substance and Sexual Activity  . Alcohol use: Yes    Comment: special occasions  . Drug use: No  . Sexual activity: Yes    Birth control/protection: None    Comment: Celibate

## 2019-04-28 MED FILL — metFORMIN HCL 500 MG TABS: 500 | 30 days supply | Qty: 60 | Fill #2

## 2019-05-04 ENCOUNTER — Other Ambulatory Visit: Payer: Self-pay

## 2019-05-04 ENCOUNTER — Encounter: Payer: Self-pay | Admitting: Internal Medicine

## 2019-05-04 ENCOUNTER — Ambulatory Visit
Admission: RE | Admit: 2019-05-04 | Discharge: 2019-05-04 | Disposition: A | Discharge status: Home / Self Care | Payer: Medicaid Other | Source: Ambulatory Visit | Attending: Obstetrics and Gynecology | Admitting: Obstetrics and Gynecology

## 2019-05-04 ENCOUNTER — Ambulatory Visit: Payer: Medicaid Other | Admitting: Internal Medicine

## 2019-05-04 VITALS — BP 144/85 | HR 86 | Temp 98.3°F | Ht 63.0 in | Wt 179.9 lb

## 2019-05-04 DIAGNOSIS — E119 Type 2 diabetes mellitus without complications: Secondary | ICD-10-CM | POA: Diagnosis not present

## 2019-05-04 DIAGNOSIS — Z7984 Long term (current) use of oral hypoglycemic drugs: Secondary | ICD-10-CM | POA: Diagnosis not present

## 2019-05-04 DIAGNOSIS — R197 Diarrhea, unspecified: Secondary | ICD-10-CM | POA: Diagnosis not present

## 2019-05-04 DIAGNOSIS — Z720 Tobacco use: Secondary | ICD-10-CM

## 2019-05-04 DIAGNOSIS — Z23 Encounter for immunization: Secondary | ICD-10-CM

## 2019-05-04 DIAGNOSIS — M1710 Unilateral primary osteoarthritis, unspecified knee: Secondary | ICD-10-CM

## 2019-05-04 DIAGNOSIS — Z79899 Other long term (current) drug therapy: Secondary | ICD-10-CM

## 2019-05-04 DIAGNOSIS — Z1231 Encounter for screening mammogram for malignant neoplasm of breast: Secondary | ICD-10-CM

## 2019-05-04 DIAGNOSIS — F419 Anxiety disorder, unspecified: Secondary | ICD-10-CM

## 2019-05-04 DIAGNOSIS — R030 Elevated blood-pressure reading, without diagnosis of hypertension: Secondary | ICD-10-CM | POA: Diagnosis present

## 2019-05-04 DIAGNOSIS — Z7952 Long term (current) use of systemic steroids: Secondary | ICD-10-CM

## 2019-05-04 DIAGNOSIS — K5900 Constipation, unspecified: Secondary | ICD-10-CM

## 2019-05-04 DIAGNOSIS — K921 Melena: Secondary | ICD-10-CM | POA: Diagnosis not present

## 2019-05-04 DIAGNOSIS — G44219 Episodic tension-type headache, not intractable: Secondary | ICD-10-CM

## 2019-05-04 DIAGNOSIS — Z716 Tobacco abuse counseling: Secondary | ICD-10-CM

## 2019-05-04 DIAGNOSIS — E785 Hyperlipidemia, unspecified: Secondary | ICD-10-CM

## 2019-05-04 DIAGNOSIS — K219 Gastro-esophageal reflux disease without esophagitis: Secondary | ICD-10-CM

## 2019-05-04 LAB — GLUCOSE, CAPILLARY: Glucose-Capillary: 131 mg/dL — ABNORMAL HIGH (ref 70–99)

## 2019-05-04 LAB — POCT GLYCOSYLATED HEMOGLOBIN (HGB A1C): Hemoglobin A1C: 6.1 % — AB (ref 4.0–5.6)

## 2019-05-04 MED ORDER — ATORVASTATIN CALCIUM 80 MG PO TABS: 80.0000 mg | ORAL_TABLET | Freq: Every day | ORAL | 2 refills | Status: DC

## 2019-05-04 MED ORDER — VARENICLINE TARTRATE 1 MG PO TABS: 1.0000 mg | ORAL_TABLET | Freq: Two times a day (BID) | ORAL | 1 refills | Status: DC

## 2019-05-04 MED ORDER — OMEPRAZOLE 20 MG PO CPDR: 20.0000 mg | DELAYED_RELEASE_CAPSULE | Freq: Every day | ORAL | 1 refills | Status: DC

## 2019-05-04 MED FILL — ATORVASTATIN 80 MG TABLET: 80 | 30 days supply | Qty: 30 | Fill #0

## 2019-05-04 MED FILL — CHANTIX 1 MG CONT MONTH BOX: 1 | 28 days supply | Qty: 56 | Fill #0

## 2019-05-04 NOTE — Assessment & Plan Note (Addendum)
Pt endorsing headaches at home. States they occur most at the end of the day and are located in the back of her head/neck and in a bitemporal band distrubution. They are throbbing in nature. Pt stating she has a lot of anxiety regarding her health and the current COVID-19 pandemic. She is seen at [facility], and her doctor there recently increased her medication dose.  Assessment - Pt's history consistent with tension type headaches. - instructed the pt to take Tylenol or ibuprofen at home - educated on the risk for rebound headaches if she uses these medications often and then stops - continued follow-up with [facility] for anxiety

## 2019-05-04 NOTE — Assessment & Plan Note (Signed)
Pt has smoked 2-3 cigarettes over the last 2 weeks due to anxiety and stress surrounding the COVID-19 pandemic. She is requesting a Chantix refill. Endorses it was helping with her cravings and she ran out of the prescription a few weeks ago.   - refill Chantix

## 2019-05-04 NOTE — Assessment & Plan Note (Signed)
BP Readings from Last 3 Encounters:  05/04/19 (!) 144/85  03/21/19 132/89  02/15/19 126/88   Pt's automatic blood pressure today 144/85. On manual recheck BP 130/82. Pt endorsing continued anxiety regarding her blood pressure and diabetes.  Discussed with the pt that her BP looks good today, and there is no need at this time to start medications. Encouraged life style modifications and smoking cessation. Will continue to monitor.

## 2019-05-04 NOTE — Assessment & Plan Note (Signed)
Pt describes having episodes of black stools for months. She states she has intermittent constipation and diarrhea and then on occasion the stools are black. Denies ever seeing red blood, nausea, or vomiting. Endorses abdominal bloating and occasional discomfort. Cannot identify any triggers for the discomfort. States she had a colonoscopy 3 or 4 years ago which showed "inflammation at the connection between the small and large intestines." She takes omeprazole 66m daily for reflux symptoms.  Assessment - IBS vs Crohn's vs GI bleed - pt hemodynamically stable today, will check CBC - ordered ESR and CRP - [place] will continue to monitor - instructed pt to continue taking stool softener like fiber or Miralax everyday to prevent constipation with subsequent diarrhea around the stool

## 2019-05-04 NOTE — Patient Instructions (Signed)
[name], [place] were seen today for lab work and a blood pressure check. Your blood pressure looks great at today's visit. Continue to practice a healthy lifestyle - continuing to stop smoking, exercise, and eating healthy with lots of fruits and vegetables. We will check your Hemoglobin A1c and other labs today, and I will call you with the results. I have also placed a referral to ophthalmology to examine the health of your eyes.  Please return in 3-5 months for continued follow-up.

## 2019-05-04 NOTE — Progress Notes (Signed)
   CC: follow-up and BP check.  HPI:  [name] is a 58 y.o. F with PMH as stated below. Please see problem based charting for additional details.  Past Medical History:  Diagnosis Date  . Bipolar depression ([place]) 09/2015  . GERD (gastroesophageal reflux disease)   . History of patellar fracture 2016  . History of tibial fracture 2016  . Hyperlipidemia   . Night terrors   . Night terrors, adult 09/2015   Review of Systems:  Review of Systems  Constitutional: Negative for chills and fever.  Eyes:       Floaters  Respiratory: Negative for shortness of breath and wheezing.   Cardiovascular: Negative for chest pain.  Gastrointestinal: Positive for constipation, diarrhea and melena. Negative for abdominal pain, nausea and vomiting.  Neurological: Positive for headaches. Negative for dizziness.  Psychiatric/Behavioral: The patient is nervous/anxious.    Physical Exam: Vitals:   05/04/19 0900  BP: (!) 144/85  Pulse: 86  Temp: 98.3 F (36.8 C)  TempSrc: Oral  SpO2: 99%  Weight: 179 lb 14.4 oz (81.6 kg)  Physical Exam Vitals signs and nursing note reviewed.  Constitutional:      General: She is not in acute distress.    Appearance: Normal appearance.     Comments: Manual BP recheck 130/82  Cardiovascular:     Rate and Rhythm: Normal rate and regular rhythm.     Heart sounds: Normal heart sounds.  Pulmonary:     Effort: Pulmonary effort is normal.     Breath sounds: Normal breath sounds.  Abdominal:     General: Abdomen is flat. Bowel sounds are normal. There is no distension.     Palpations: Abdomen is soft.     Tenderness: There is no abdominal tenderness.  Neurological:     Mental Status: She is alert.    Assessment & Plan:   See Encounters Tab for problem based charting.  Patient seen with Dr. Rebeca Alert

## 2019-05-04 NOTE — Assessment & Plan Note (Signed)
Pt started metformin 3 months ago. Has been taking prolonged prednisone course for knee osteoarthritis, that is being taper off this week and next.   A1c recheck today 6.1 (was 6.7 on 01/2019)  Good sugar control especially in light of concurrent steroid use. Instructed pt to continue lifestyle modifications and taking Metformin. Will refer to ophthalmology for diabetic eye exam and evaluation of floaters.

## 2019-05-05 ENCOUNTER — Other Ambulatory Visit: Payer: Self-pay

## 2019-05-05 DIAGNOSIS — Z716 Tobacco abuse counseling: Secondary | ICD-10-CM

## 2019-05-05 LAB — CBC
Hematocrit: 39.8 % (ref 34.0–46.6)
Hemoglobin: 12.5 g/dL (ref 11.1–15.9)
MCH: 27.9 pg (ref 26.6–33.0)
MCHC: 31.4 g/dL — ABNORMAL LOW (ref 31.5–35.7)
MCV: 89 fL (ref 79–97)
Platelets: 307 10*3/uL (ref 150–450)
RBC: 4.48 x10E6/uL (ref 3.77–5.28)
RDW: 14.4 % (ref 11.7–15.4)
WBC: 10.7 10*3/uL (ref 3.4–10.8)

## 2019-05-05 LAB — C-REACTIVE PROTEIN: CRP: 2 mg/L (ref 0–10)

## 2019-05-05 LAB — SEDIMENTATION RATE: Sed Rate: 31 mm/hr (ref 0–40)

## 2019-05-05 MED ORDER — VARENICLINE TARTRATE 1 MG PO TABS: 1.0000 mg | ORAL_TABLET | Freq: Two times a day (BID) | ORAL | 1 refills | Status: DC

## 2019-05-05 NOTE — Telephone Encounter (Signed)
Requesting a refill on Chantix, [facility].

## 2019-05-10 ENCOUNTER — Other Ambulatory Visit: Payer: Self-pay | Admitting: Internal Medicine

## 2019-05-10 MED FILL — OMEPRAZOLE 20 MG CAPSULE DR: 20 | 30 days supply | Qty: 30 | Fill #0

## 2019-05-10 NOTE — Progress Notes (Signed)
Encounter opened in error

## 2019-05-10 NOTE — Progress Notes (Signed)
Internal Medicine Clinic Attending  I saw and evaluated the patient.  I personally confirmed the key portions of the history and exam documented by Dr. Ronnald Ramp and I reviewed pertinent patient test results.  The assessment, diagnosis, and plan were formulated together and I agree with the documentation in the resident's note.  Her description of dark stools and prior colonoscopy showing "inflammation where the small intestine joins the colon" concerning for terminal ileitis, possibly IBD or other etiology. However, reassuring that ESR and CRP are normal, will work on obtaining records to clarify prior endoscopic findings. If ongoing concern for IBD, fecal calprotectin would be useful.  Lenice Pressman, M.D., Ph.D.

## 2019-05-17 MED FILL — ATORVASTATIN 80 MG TABLET: 80 | 30 days supply | Qty: 30 | Fill #0

## 2019-05-17 MED FILL — CHANTIX 1 MG CONT MONTH BOX: 1 | 28 days supply | Qty: 56 | Fill #0

## 2019-05-18 ENCOUNTER — Other Ambulatory Visit: Payer: Self-pay | Admitting: Dietician

## 2019-05-18 DIAGNOSIS — E119 Type 2 diabetes mellitus without complications: Secondary | ICD-10-CM

## 2019-05-18 NOTE — Progress Notes (Signed)
Request referral for diabetes self management training and support.

## 2019-05-19 ENCOUNTER — Encounter: Payer: Medicaid Other | Admitting: Dietician

## 2019-05-20 MED FILL — OMEPRAZOLE 20 MG CAPSULE DR: 20 | 30 days supply | Qty: 30 | Fill #0

## 2019-05-22 ENCOUNTER — Emergency Department (HOSPITAL_COMMUNITY): Payer: Medicaid Other

## 2019-05-22 ENCOUNTER — Emergency Department (HOSPITAL_COMMUNITY)
Admission: EM | Admit: 2019-05-22 | Discharge: 2019-05-22 | Disposition: A | Discharge status: Home / Self Care | Payer: Medicaid Other | Attending: Emergency Medicine | Admitting: Emergency Medicine

## 2019-05-22 ENCOUNTER — Other Ambulatory Visit: Payer: Self-pay

## 2019-05-22 ENCOUNTER — Encounter (HOSPITAL_COMMUNITY): Payer: Self-pay

## 2019-05-22 DIAGNOSIS — Z87891 Personal history of nicotine dependence: Secondary | ICD-10-CM | POA: Insufficient documentation

## 2019-05-22 DIAGNOSIS — Z7984 Long term (current) use of oral hypoglycemic drugs: Secondary | ICD-10-CM | POA: Diagnosis not present

## 2019-05-22 DIAGNOSIS — Z91040 Latex allergy status: Secondary | ICD-10-CM | POA: Insufficient documentation

## 2019-05-22 DIAGNOSIS — R1030 Lower abdominal pain, unspecified: Secondary | ICD-10-CM

## 2019-05-22 DIAGNOSIS — K5900 Constipation, unspecified: Secondary | ICD-10-CM | POA: Diagnosis present

## 2019-05-22 DIAGNOSIS — K602 Anal fissure, unspecified: Secondary | ICD-10-CM | POA: Diagnosis not present

## 2019-05-22 DIAGNOSIS — E119 Type 2 diabetes mellitus without complications: Secondary | ICD-10-CM | POA: Insufficient documentation

## 2019-05-22 DIAGNOSIS — K5903 Drug induced constipation: Secondary | ICD-10-CM | POA: Diagnosis not present

## 2019-05-22 DIAGNOSIS — Z79899 Other long term (current) drug therapy: Secondary | ICD-10-CM | POA: Insufficient documentation

## 2019-05-22 DIAGNOSIS — I10 Essential (primary) hypertension: Secondary | ICD-10-CM | POA: Diagnosis not present

## 2019-05-22 LAB — COMPREHENSIVE METABOLIC PANEL
ALT: 20 U/L (ref 0–44)
AST: 20 U/L (ref 15–41)
Albumin: 4.2 g/dL (ref 3.5–5.0)
Alkaline Phosphatase: 62 U/L (ref 38–126)
Anion gap: 12 (ref 5–15)
BUN: 24 mg/dL — ABNORMAL HIGH (ref 6–20)
CO2: 24 mmol/L (ref 22–32)
Calcium: 9.4 mg/dL (ref 8.9–10.3)
Chloride: 103 mmol/L (ref 98–111)
Creatinine, Ser: 0.47 mg/dL (ref 0.44–1.00)
GFR calc Af Amer: >60 mL/min (ref >60)
GFR calc non Af Amer: >60 mL/min (ref >60)
Glucose, Bld: 124 mg/dL — ABNORMAL HIGH (ref 70–99)
Potassium: 4.1 mmol/L (ref 3.5–5.1)
Sodium: 139 mmol/L (ref 135–145)
Total Bilirubin: 0.2 mg/dL — ABNORMAL LOW (ref 0.3–1.2)
Total Protein: 7.6 g/dL (ref 6.5–8.1)

## 2019-05-22 LAB — CBC WITH DIFFERENTIAL/PLATELET
Abs Immature Granulocytes: 0.06 10*3/uL (ref 0.00–0.07)
Basophils Absolute: 0.1 10*3/uL (ref 0.0–0.1)
Basophils Relative: 1 %
Eosinophils Absolute: 0.1 10*3/uL (ref 0.0–0.5)
Eosinophils Relative: 1 %
HCT: 41.5 % (ref 36.0–46.0)
Hemoglobin: 12.7 g/dL (ref 12.0–15.0)
Immature Granulocytes: 1 %
Lymphocytes Relative: 29 %
Lymphs Abs: 2.6 10*3/uL (ref 0.7–4.0)
MCH: 26.7 pg (ref 26.0–34.0)
MCHC: 30.6 g/dL (ref 30.0–36.0)
MCV: 87.4 fL (ref 80.0–100.0)
Monocytes Absolute: 0.9 10*3/uL (ref 0.1–1.0)
Monocytes Relative: 10 %
Neutro Abs: 5.2 10*3/uL (ref 1.7–7.7)
Neutrophils Relative %: 58 %
Platelets: 334 10*3/uL (ref 150–400)
RBC: 4.75 MIL/uL (ref 3.87–5.11)
RDW: 15.4 % (ref 11.5–15.5)
WBC: 8.8 10*3/uL (ref 4.0–10.5)
nRBC: 0 % (ref 0.0–0.2)

## 2019-05-22 LAB — URINALYSIS, ROUTINE W REFLEX MICROSCOPIC
Bilirubin Urine: NEGATIVE
Glucose, UA: NEGATIVE mg/dL
Hgb urine dipstick: NEGATIVE
Ketones, ur: NEGATIVE mg/dL
Leukocytes,Ua: NEGATIVE
Nitrite: NEGATIVE
Protein, ur: NEGATIVE mg/dL
Specific Gravity, Urine: 1.018 (ref 1.005–1.030)
pH: 7 (ref 5.0–8.0)

## 2019-05-22 LAB — LIPASE, BLOOD: Lipase: 27 U/L (ref 11–51)

## 2019-05-22 MED ORDER — POLYETHYLENE GLYCOL 3350 17 GM/SCOOP PO POWD: 1.0000 | Freq: Once | ORAL | 0 refills | Status: AC

## 2019-05-22 MED ORDER — IOHEXOL 300 MG/ML  SOLN
100.0000 mL | Freq: Once | INTRAMUSCULAR | Status: AC | PRN
  Administered 2019-05-22: 11:00:00 100 mL via INTRAVENOUS

## 2019-05-22 MED ORDER — LIDOCAINE HCL URETHRAL/MUCOSAL 2 % EX GEL
1.0000 "application " | Freq: Once | CUTANEOUS | Status: AC
  Administered 2019-05-22: 1 via TOPICAL
  Filled 2019-05-22: qty 5

## 2019-05-22 MED ORDER — SENNOSIDES-DOCUSATE SODIUM 8.6-50 MG PO TABS: 1.0000 | ORAL_TABLET | Freq: Two times a day (BID) | ORAL | 0 refills | Status: DC | PRN

## 2019-05-22 MED ORDER — SODIUM CHLORIDE (PF) 0.9 % IJ SOLN
INTRAMUSCULAR | Status: AC
  Filled 2019-05-22: qty 50

## 2019-05-22 NOTE — ED Triage Notes (Signed)
Pt c/o of being unable to pass stool.  Pt states it was very painful to try to have a BM this am (pt was unsuccessful).  Pt states she had blood when she wiped.  Pt states she tried to manually disimpact herself with her finger this am also.  pts last BM was yesterday morning.

## 2019-05-22 NOTE — ED Provider Notes (Signed)
[facility] DEPT Provider Note   CSN: [id number] Arrival date & time: 05/22/19  0720     History   Chief Complaint Chief Complaint  Patient presents with  . Constipation  . Rectal Bleeding    HPI Meghan Wilson is a 58 y.o. female.     HPI  58 year old female with history of diabetes, hypertension, hyperlipidemia, bipolar, presents with concern for abdominal pain, rectal pain and constipation.  Reports he had normal bowel movement yesterday, but today she woke up at 4 AM with lower abdominal cramping.  Reports at first she felt as if she needed to have a bowel movement, however when she attempted to have a bowel movement had severe rectal pain.  She felt that there was a large stool sitting in her rectum that she was unable to pass, and reports that she tried to self disimpact herself.  Reports then she had some bright red blood per rectum.  Reports with the episodes of needing have a bowel movement, she has increasing abdominal pain, chills and nausea.  Denies any vomiting, urinary symptoms.  Does have a history of cholecystectomy tubal ligation.  Reports prior history of hemorrhoids.  Reports continuing abdominal pain at this time.  Feels hot and cold but no fever.  Had been taking hydrocodone beginning on Thursday for pain.   Past Medical History:  Diagnosis Date  . Bipolar depression ([place]) 09/2015  . GERD (gastroesophageal reflux disease)   . History of patellar fracture 2016  . History of tibial fracture 2016  . Hyperlipidemia   . Night terrors   . 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 Episodic tension-type headache, not intractable 05/04/2019  . Melena 05/04/2019  . Type II diabetes mellitus ([place]) 02/16/2019  . Elevated BP without diagnosis of hypertension 05/06/2018  . Pain of right knee after injury 03/10/2018  . Atopic dermatitis 02/15/2018  . Allergic contact dermatitis due to latex  01/02/2018  . Encounter for smoking cessation counseling 01/02/2018  . Hyperlipidemia 01/01/2018  . Peptic gastritis 01/01/2018  . Morbid obesity ([place]) 08/03/2015  . Bipolar disorder ([place]) 08/03/2015    Past Surgical History:  Procedure Laterality Date  . CHOLECYSTECTOMY    . JOINT REPLACEMENT       OB History    Gravida  3   Para      Term      Preterm      AB  1   Living  2     SAB  1   TAB      Ectopic      Multiple      Live Births  2            Home Medications    Prior to Admission medications   Medication Sig Start Date End Date Taking? Authorizing Provider  atorvastatin (LIPITOR) 80 MG tablet Take 1 tablet (80 mg total) by mouth daily. 05/04/19  Yes Ladona Horns, MD  fluticasone [facility]) 50 MCG/ACT nasal spray Place 1 spray into both nostrils daily as needed for allergies or rhinitis.   Yes [provider]  hydrOXYzine (ATARAX/VISTARIL) 10 MG tablet Take 10 mg by mouth 2 (two) times daily as needed for anxiety. 04/18/19  Yes [provider]  lamoTRIgine (LAMICTAL) 100 MG tablet Take 100 mg by mouth daily.  12/10/17  Yes [provider]  metFORMIN (GLUCOPHAGE) 500 MG tablet Take 1 tablet (500 mg total) by  mouth 2 (two) times daily with a meal. Take 1 tablet daily for first week, then 2 times daily thereafter. 02/15/19 02/15/20 Yes Bloomfield, Carley D, DO  omeprazole (PRILOSEC) 20 MG capsule Take 1 capsule (20 mg total) by mouth daily. 05/04/19  Yes Ladona Horns, MD  triamcinolone ointment (KENALOG) 0.5 % Apply to areas of hand with rash twice daily. Patient taking differently: Apply 1 application topically 2 (two) times daily. Apply to area under breast twice daily. 02/15/18  Yes Lacroce, Hulen Shouts, MD  polyethylene glycol powder (MIRALAX) 17 GM/SCOOP powder Take 255 g by mouth once for 1 dose. 05/22/19 05/22/19  Gareth Morgan, MD  senna-docusate (SENOKOT-S) 8.6-50 MG tablet Take 1-2 tablets by mouth 2 (two) times daily as needed  for mild constipation or moderate constipation. 05/22/19   Gareth Morgan, MD  varenicline (CHANTIX CONTINUING MONTH PAK) 1 MG tablet Take 1 tablet (1 mg total) by mouth 2 (two) times daily. 05/05/19   Seawell, Laurell Roof, DO    Family History Family History  Problem Relation Age of Onset  . Hyperlipidemia Mother   . Hypertension Mother   . Arthritis/Rheumatoid Father   . Hyperlipidemia Maternal Aunt   . Hypertension Maternal Aunt   . Cancer Maternal Aunt     Social History Social History   Tobacco Use  . Smoking status: Former Smoker    Packs/day: 0.25    Years: 4.00    Pack years: 1.00    Quit date: 12/21/2017    Years since quitting: 1.4  . Smokeless tobacco: Never Used  Substance Use Topics  . Alcohol use: Yes    Comment: special occasions  . Drug use: No     Allergies   Depakote [divalproex sodium], Ibuprofen, and Latex   Review of Systems Review of Systems  Constitutional: Negative for fever.  HENT: Negative for sore throat.   Eyes: Negative for visual disturbance.  Respiratory: Negative for cough and shortness of breath.   Cardiovascular: Negative for chest pain.  Gastrointestinal: Positive for abdominal pain, anal bleeding, constipation and nausea. Negative for vomiting.  Genitourinary: Negative for difficulty urinating.  Musculoskeletal: Negative for back pain and neck pain.  Skin: Negative for rash.  Neurological: Negative for syncope and headaches.     Physical Exam Updated Vital Signs BP (!) 135/91   Pulse 98   Temp 98.3 F (36.8 C) (Oral)   Resp 16   Ht 5\' 3"  (1.6 m)   Wt 79.8 kg   SpO2 98%   BMI 31.18 kg/m   Physical Exam Vitals signs and nursing note reviewed.  Constitutional:      General: She is not in acute distress.    Appearance: She is well-developed. She is not diaphoretic.  HENT:     Head: Normocephalic and atraumatic.  Eyes:     Conjunctiva/sclera: Conjunctivae normal.  Neck:     Musculoskeletal: Normal range of motion.   Cardiovascular:     Rate and Rhythm: Normal rate and regular rhythm.     Heart sounds: Normal heart sounds. No murmur. No friction rub. No gallop.   Pulmonary:     Effort: Pulmonary effort is normal. No respiratory distress.     Breath sounds: Normal breath sounds. No wheezing or rales.  Abdominal:     General: There is no distension.     Palpations: Abdomen is soft.     Tenderness: There is abdominal tenderness (diffuse, lower abdominal tenderness worse). There is no guarding.  Genitourinary:    Comments: Rectal  exam with brown stool at finger tip, signs of prior hemorrhoid, no sign of active inflammation, no sign of impaction  Musculoskeletal:        General: No tenderness.  Skin:    General: Skin is warm and dry.     Findings: No erythema or rash.  Neurological:     Mental Status: She is alert and oriented to person, place, and time.      ED Treatments / Results  Labs (all labs ordered are listed, but only abnormal results are displayed) Labs Reviewed  COMPREHENSIVE METABOLIC PANEL - Abnormal; Notable for the following components:      Result Value   Glucose, Bld 124 (*)    BUN 24 (*)    Total Bilirubin 0.2 (*)    All other components within normal limits  URINALYSIS, ROUTINE W REFLEX MICROSCOPIC - Abnormal; Notable for the following components:   Color, Urine STRAW (*)    All other components within normal limits  URINE CULTURE  CBC WITH DIFFERENTIAL/PLATELET  LIPASE, BLOOD    EKG None  Radiology Ct Abdomen Pelvis W Contrast  Result Date: 05/22/2019 CLINICAL DATA:  Constipation EXAM: CT ABDOMEN AND PELVIS WITH CONTRAST TECHNIQUE: Multidetector CT imaging of the abdomen and pelvis was performed using the standard protocol following bolus administration of intravenous contrast. CONTRAST:  166mL OMNIPAQUE IOHEXOL 300 MG/ML  SOLN COMPARISON:  None. FINDINGS: Lower chest: Lung bases are clear. Hepatobiliary: Liver is within normal limits. Status post cholecystectomy.  No intrahepatic or extrahepatic ductal dilatation. Pancreas: Within normal limits. Spleen: Within normal limits. Adrenals/Urinary Tract: Adrenal glands are within normal limits. Kidneys are within normal limits.  No hydronephrosis. Bladder is within normal limits. Stomach/Bowel: Stomach is within normal limits. No evidence of bowel obstruction. Normal appendix (series 4/image 21). Extensive colonic diverticulosis, without evidence of diverticulitis. No colonic wall thickening or inflammatory changes. Vascular/Lymphatic: No evidence of abdominal aortic aneurysm. Atherosclerotic calcifications of the abdominal aorta and branch vessels. No suspicious abdominopelvic lymphadenopathy. Reproductive: Status post hysterectomy. No adnexal mass. Other: No abdominopelvic ascites. Musculoskeletal: Visualized osseous structures are within normal limits. IMPRESSION: Extensive colonic diverticulosis, without evidence of diverticulitis. No evidence of bowel obstruction.  Normal appendix. Prior cholecystectomy and hysterectomy. Electronically Signed   By: Julian Hy M.D.   On: 05/22/2019 11:35    Procedures Procedures (including critical care time)  Medications Ordered in ED Medications  lidocaine (XYLOCAINE) 2 % jelly 1 application (1 application Topical Given by Other 05/22/19 0900)  iohexol (OMNIPAQUE) 300 MG/ML solution 100 mL (100 mLs Intravenous Contrast Given 05/22/19 1050)     Initial Impression / Assessment and Plan / ED Course  I have reviewed the triage vital signs and the nursing notes.  Pertinent labs & imaging results that were available during my care of the patient were reviewed by me and considered in my medical decision making (see chart for details).       58 year old female with history of diabetes, hypertension, hyperlipidemia, bipolar, presents with concern for abdominal pain, rectal pain and constipation.  Attempted disimpaction given history consistent with constipation and impaction,  however no impacted stool was found on exam.  Given this and patient's abdominal tenderness, will obtain CT imaging to evaluate for diverticulitis as an etiology of patient's abdominal pain.  Labs obtained showing no acute abnormalities.  Urinalysis without infection.  CT abdomen pelvis shows diverticulosis but no diverticulitis.  Symptoms likely secondary to constipation. Given rx for senna, colace, miralax. Recommend sitz baths and avoidance of constipation for  rectal pain likely secondary to anal fissure.    Final Clinical Impressions(s) / ED Diagnoses   Final diagnoses:  Lower abdominal pain  Drug-induced constipation  Anal fissure    ED Discharge Orders         Ordered    senna-docusate (SENOKOT-S) 8.6-50 MG tablet  2 times daily PRN     05/22/19 1218    polyethylene glycol powder (MIRALAX) 17 GM/SCOOP powder   Once     05/22/19 1218           Gareth Morgan, MD 05/22/19 2053

## 2019-05-22 NOTE — Discharge Instructions (Signed)
Recommend hydration, fiber, activty and recommended miralax taper if no results today with 1 cap miralax and other measures.  (4 caps in 1 32oz bottle day 1, 3 caps day 2, 2 caps day 3.)

## 2019-05-23 ENCOUNTER — Ambulatory Visit: Payer: Medicaid Other | Admitting: Orthopedic Surgery

## 2019-05-23 ENCOUNTER — Telehealth: Payer: Self-pay | Admitting: Dietician

## 2019-05-23 LAB — URINE CULTURE

## 2019-05-23 MED FILL — SM CLEARLAX POWDER: 17 | 7 days supply | Qty: 238 | Fill #0

## 2019-05-23 NOTE — Telephone Encounter (Signed)
Left voicemail for return call  

## 2019-05-24 ENCOUNTER — Ambulatory Visit (INDEPENDENT_AMBULATORY_CARE_PROVIDER_SITE_OTHER): Payer: Medicaid Other | Admitting: Family

## 2019-05-24 ENCOUNTER — Other Ambulatory Visit: Payer: Self-pay

## 2019-05-24 ENCOUNTER — Encounter: Payer: Self-pay | Admitting: Family

## 2019-05-24 VITALS — Ht 63.0 in | Wt 176.0 lb

## 2019-05-24 DIAGNOSIS — M7061 Trochanteric bursitis, right hip: Secondary | ICD-10-CM | POA: Diagnosis not present

## 2019-05-25 DIAGNOSIS — M7061 Trochanteric bursitis, right hip: Secondary | ICD-10-CM

## 2019-05-25 MED ORDER — METHYLPREDNISOLONE ACETATE 40 MG/ML IJ SUSP
40.0000 mg | INTRAMUSCULAR | Status: AC | PRN
  Administered 2019-05-25: 11:00:00 40 mg via INTRA_ARTICULAR

## 2019-05-25 MED ORDER — LIDOCAINE HCL 1 % IJ SOLN
5.0000 mL | INTRAMUSCULAR | Status: AC | PRN
  Administered 2019-05-25: 11:00:00 5 mL

## 2019-05-25 NOTE — Progress Notes (Signed)
Office Visit Note   Patient: Meghan Wilson           Date of Birth: 30-Jun-1961           MRN: [id number] Visit Date: 05/24/2019              Requested by: Molli Hazard A, DO [address],  [place] [address] PCP: Marty Heck, DO  Chief Complaint  Patient presents with  . Lower Back - Pain  . Right Knee - Pain      HPI: Patient is a 58 year old woman who presents today complaining of low back pain and right hip pain this is associated with some numbness down her right leg.  The pain has been gradually increasing and is now unbearable.  She had been taking some prednisone for lumbar radiculopathy which was useful but she is stopped taking it.  Her pain is primarily in the lateral aspect of her right hip today.  She has been sleeping on a heating pad this has been providing some mild relief.  Pain worse with ambulation and occasionally radiates to her groin.  Assessment & Plan: Visit Diagnoses:  1. Trochanteric bursitis, right hip     Plan: right greater trochanteric bursa injection, patient tolerated well. Follow up as needed.   Follow-Up Instructions: Return in about 4 weeks (around 06/21/2019).   Right Hip Exam   Tenderness  The patient is experiencing tenderness in the greater trochanter.  Range of Motion  The patient has normal right hip ROM.  Muscle Strength  The patient has normal right hip strength.  Tests  FABER: negative  Other  Erythema: absent Sensation: normal   Back Exam   Tenderness  The patient is experiencing no tenderness.   Tests  Straight leg raise right: negative Straight leg raise left: negative      Patient is alert, oriented, no adenopathy, well-dressed, normal affect, normal respiratory effort.   Imaging: No results found. No images are attached to the encounter.  Labs: Lab Results  Component Value Date   HGBA1C 6.1 (A) 05/04/2019   HGBA1C 6.7 (A) 02/15/2019   HGBA1C 6.5 (H) 04/02/2018   ESRSEDRATE  31 05/04/2019   CRP 2 05/04/2019   REPTSTATUS 05/23/2019 FINAL 05/22/2019   CULT MULTIPLE SPECIES PRESENT, SUGGEST RECOLLECTION (A) 05/22/2019     Lab Results  Component Value Date   ALBUMIN 4.2 05/22/2019   ALBUMIN 4.5 01/01/2018    No results found for: MG No results found for: VD25OH  No results found for: PREALBUMIN CBC EXTENDED Latest Ref Rng & Units 05/22/2019 05/04/2019 01/01/2018  WBC 4.0 - 10.5 K/uL 8.8 10.7 8.0  RBC 3.87 - 5.11 MIL/uL 4.75 4.48 4.96  HGB 12.0 - 15.0 g/dL 12.7 12.5 13.3  HCT 36.0 - 46.0 % 41.5 39.8 41.7  PLT 150 - 400 K/uL 334 307 340  NEUTROABS 1.7 - 7.7 K/uL 5.2 - -  LYMPHSABS 0.7 - 4.0 K/uL 2.6 - -     Body mass index is 31.18 kg/m.  Orders:  No orders of the defined types were placed in this encounter.  No orders of the defined types were placed in this encounter.    Procedures: Large Joint Inj: R greater trochanter on 05/25/2019 10:55 AM Indications: pain Details: 22 G 1.5 in needle, lateral approach Medications: 5 mL lidocaine 1 %; 40 mg methylPREDNISolone acetate 40 MG/ML Outcome: tolerated well, no immediate complications Consent was given by the patient.      Clinical Data:  No additional findings.  ROS:  All other systems negative, except as noted in the HPI. Review of Systems  Constitutional: Negative for chills and fever.  Musculoskeletal: Positive for arthralgias and back pain. Negative for myalgias.  Neurological: Negative for weakness and numbness.    Objective: Vital Signs: Ht 5\' 3"  (1.6 m)   Wt 176 lb (79.8 kg)   BMI 31.18 kg/m   Specialty Comments:  No specialty comments available.  PMFS History: Patient Active Problem List   Diagnosis Date Noted  . Episodic tension-type headache, not intractable 05/04/2019  . Melena 05/04/2019  . Type II diabetes mellitus ([place]) 02/16/2019  . Elevated BP without diagnosis of hypertension 05/06/2018  . Pain of right knee after injury 03/10/2018  . Atopic dermatitis  02/15/2018  . 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 Encounter for smoking cessation counseling 01/02/2018  . Hyperlipidemia 01/01/2018  . Peptic gastritis 01/01/2018  . Morbid obesity ([place]) 08/03/2015  . Bipolar disorder ([place]) 08/03/2015   Past Medical History:  Diagnosis Date  . Bipolar depression ([place]) 09/2015  . GERD (gastroesophageal reflux disease)   . History of patellar fracture 2016  . History of tibial fracture 2016  . Hyperlipidemia   . Night terrors   . Night terrors, adult 09/2015    Family History  Problem Relation Age of Onset  . Hyperlipidemia Mother   . Hypertension Mother   . Arthritis/Rheumatoid Father   . Hyperlipidemia Maternal Aunt   . Hypertension Maternal Aunt   . Cancer Maternal Aunt     Past Surgical History:  Procedure Laterality Date  . CHOLECYSTECTOMY    . JOINT REPLACEMENT     Social History   Occupational History  . Not on file  Tobacco Use  . Smoking status: Former Smoker    Packs/day: 0.25    Years: 4.00    Pack years: 1.00    Quit date: 12/21/2017    Years since quitting: 1.4  . Smokeless tobacco: Never Used  Substance and Sexual Activity  . Alcohol use: Yes    Comment: special occasions  . Drug use: No  . Sexual activity: Yes    Birth control/protection: None    Comment: Celibate

## 2019-05-30 ENCOUNTER — Other Ambulatory Visit: Payer: Self-pay

## 2019-05-30 ENCOUNTER — Encounter: Payer: Self-pay | Admitting: Dietician

## 2019-05-30 ENCOUNTER — Ambulatory Visit: Payer: Medicaid Other | Admitting: Dietician

## 2019-05-30 DIAGNOSIS — Z713 Dietary counseling and surveillance: Secondary | ICD-10-CM

## 2019-05-30 DIAGNOSIS — E119 Type 2 diabetes mellitus without complications: Secondary | ICD-10-CM | POA: Diagnosis not present

## 2019-05-30 DIAGNOSIS — Z6832 Body mass index (BMI) 32.0-32.9, adult: Secondary | ICD-10-CM

## 2019-05-30 NOTE — Progress Notes (Signed)
Diabetes Self-Management Education  Visit Type: First/Initial  Appt. Start Time: 0930 Appt. End Time: [id number]  05/30/2019  [name], identified by name and date of birth, is a 58 y.o. female with a diagnosis of Diabetes: Type 2.   ASSESSMENT  Patient has knee and back pain that may be contributing to increase blood sugars.  Lab Results  Component Value Date   HGBA1C 6.1 (A) 05/04/2019   HGBA1C 6.7 (A) 02/15/2019   HGBA1C 6.5 (H) 04/02/2018    Wt Readings from Last 5 Encounters:  05/30/19 180 lb 11.2 oz (82 kg)  05/24/19 176 lb (79.8 kg)  05/22/19 176 lb (79.8 kg)  05/04/19 179 lb 14.4 oz (81.6 kg)  03/29/19 179 lb (81.2 kg)     Weight 180 lb 11.2 oz (82 kg). Body mass index is 32.01 kg/m.  Diabetes Self-Management Education - 05/30/19 1100      Visit Information   Visit Type  First/Initial      Initial Visit   Diabetes Type  Type 2    Are you currently following a meal plan?  Yes   eating fruits, vegetables, legumes, drinking water, salmon   What type of meal plan do you follow?  see above    Are you taking your medications as prescribed?  Yes    Date Diagnosed  June 2020      Psychosocial Assessment   Patient Belief/Attitude about Diabetes  Motivated to manage diabetes    Self-care barriers  Low literacy;Lack of material resources    Self-management support  Doctor's office;Family;CDE visits    Patient Concerns  Nutrition/Meal planning;Medication;Monitoring;Glycemic Control    Special Needs  None    Preferred Learning Style  No preference indicated    Learning Readiness  Change in progress      Pre-Education Assessment   Patient understands the diabetes disease and treatment process.  Needs Instruction    Patient understands incorporating nutritional management into lifestyle.  Needs Review    Patient understands using medications safely.  Needs Instruction    Patient understands monitoring blood glucose, interpreting and using results  Needs  Instruction      Complications   Last HgB A1C per patient/outside source  6.1 %    How often do you check your blood sugar?  0 times/day (not testing)      Dietary Intake   Beverage(s)  water      Exercise   Exercise Type  ADL's;Light (walking / raking leaves)   limited by pain      Patient Education   Previous Diabetes Education  Yes (please comment)   prediabetes ed 1 year agao   Disease state   Definition of diabetes, type 1 and 2, and the diagnosis of diabetes;Factors that contribute to the development of diabetes    Nutrition management   Role of diet in the treatment of diabetes and the relationship between the three main macronutrients and blood glucose level;Effects of alcohol on blood glucose and safety factors with consumption of alcohol.    Medications  Reviewed patients medication for diabetes, action, purpose, timing of dose and side effects.    Monitoring  Taught/evaluated SMBG meter.   sample accu chek guildeme meter given to patient     Individualized Goals (developed by patient)   Monitoring   test my blood glucose as discussed      Outcomes   Expected Outcomes  Demonstrated interest in learning. Expect positive outcomes    Future DMSE  2 wks;4-6  wks    Program Status  Not Completed       Individualized Plan for Diabetes Self-Management Training:   Learning Objective:  Patient will have a greater understanding of diabetes self-management. Patient education plan is to attend individual and/or group sessions per assessed needs and concerns.   Plan:   Patient Instructions  Please stop at the front desk to make an appointment for 2-4 weeks.  Bring your meter back and we'll continue your diabetes education.  Please check blood sugar before breakfast daily  If you miss that, you can skip the check that day or check about 2 hours after eating breakfast.      Good job taking care of you!!  You are eating healthy!  We covered the following topics about  diabetes today:  1- what is type 1  And Type 2 diabetes 2- Why you are taking metformin for diabetes, what is does and side effects. 3- A little bit more about meal planning  [name] [phone]    Expected Outcomes:  Demonstrated interest in learning. Expect positive outcomes  Education material provided: Diabetes Resources  If problems or questions, patient to contact team via:  Phone  Future DSME appointment: 2 wks, 4-6 wks  Debera Lat, RD 05/30/2019 11:21 AM.

## 2019-05-30 NOTE — Patient Instructions (Signed)
Please stop at the front desk to make an appointment for 2-4 weeks.  Bring your meter back and we'll continue your diabetes education.  Please check blood sugar before breakfast daily  If you miss that, you can skip the check that day or check about 2 hours after eating breakfast.      Good job taking care of you!!  You are eating healthy!  We covered the following topics about diabetes today:  1- what is type 1  And Type 2 diabetes 2- Why you are taking metformin for diabetes, what is does and side effects. 3- A little bit more about meal planning  [name] [phone]

## 2019-06-01 LAB — HM DIABETES EYE EXAM

## 2019-06-02 ENCOUNTER — Encounter: Payer: Self-pay | Admitting: *Deleted

## 2019-06-03 MED FILL — metFORMIN HCL 500 MG TABS: 500 | 30 days supply | Qty: 60 | Fill #3

## 2019-06-03 MED FILL — lamoTRIgine 100 MG TABS: 100 | 30 days supply | Qty: 30 | Fill #1

## 2019-06-17 ENCOUNTER — Encounter: Payer: Self-pay | Admitting: Dietician

## 2019-06-17 ENCOUNTER — Ambulatory Visit: Payer: Medicaid Other | Admitting: Dietician

## 2019-06-17 ENCOUNTER — Telehealth: Payer: Self-pay | Admitting: Dietician

## 2019-06-17 ENCOUNTER — Other Ambulatory Visit: Payer: Self-pay

## 2019-06-17 DIAGNOSIS — Z713 Dietary counseling and surveillance: Secondary | ICD-10-CM | POA: Diagnosis not present

## 2019-06-17 DIAGNOSIS — Z6832 Body mass index (BMI) 32.0-32.9, adult: Secondary | ICD-10-CM | POA: Diagnosis not present

## 2019-06-17 DIAGNOSIS — E119 Type 2 diabetes mellitus without complications: Secondary | ICD-10-CM

## 2019-06-17 MED ORDER — ACCU-CHEK GUIDE VI STRP: ORAL_STRIP | 3 refills | Status: DC

## 2019-06-17 MED ORDER — ACCU-CHEK FASTCLIX LANCETS MISC: 3 refills | Status: DC

## 2019-06-17 MED FILL — ACCU-CHEK GUIDE TEST STRIP: 90 days supply | Qty: 100 | Fill #0

## 2019-06-17 MED FILL — ACCU-CHEK FASTCLIX LANCETS: 90 days supply | Qty: 102 | Fill #0

## 2019-06-17 MED FILL — OMEPRAZOLE 20 MG CAPSULE DR: 20 | 30 days supply | Qty: 30 | Fill #1

## 2019-06-17 MED FILL — ATORVASTATIN 80 MG TABLET: 80 | 30 days supply | Qty: 30 | Fill #1

## 2019-06-17 NOTE — Patient Instructions (Addendum)
Ms. Suddith-    You did great checking your blood sugars!!   It showed increasing blood sugars. I like your pan to exercise 3-5 times a week and eat more vegetables at lunch to turn it back the other way.   For constipation:  o Drink 64 fl oz or more of water and other low-calorie (10 calories or less per 8 fl oz) beverages o You can take Psyllium fiber every daily- add 2 tablespoons of psyllium fiber (Metamucil) , to 8 ounces of any fluids you choose o Add chia seeds or freshly ground flax seeds to yogurt or oatmeal o Eat some prunes for snack o Eat as much leafy green vegetables as possible (spinach, kale, collards, turnip and mustard greens); they provided a lot of fiber, and are a good source of magnesium as well.  - Add kale or spinach to smoothies with frozen berries (more fiber) if you do not like to eat greens. o Other magnesium-rich foods are: legumes (beans and lentils - also great sources of fiber), nuts like almonds and cashews, avocado, yogurt, and milk. o Over time try to increase intake of vegetables and fruits to 2 or more cups each per day. Be patient- This can take time.   It is recommended /you can get your pneumonia vaccine  You can get your kidney's checked next time you come by giving [place] urine to check for protein  Places to walk- [address] of [address] [address] [place] Address: [address] [place], [place] [address]  Open ? Closes 5PM Phone: [phone]

## 2019-06-17 NOTE — Progress Notes (Signed)
Diabetes Self-Management Education  Visit Type: Follow-up  Appt. Start Time: 1030 Appt. End Time: 1130  06/17/2019  [name], identified by name and date of birth, is a 58 y.o. female with a diagnosis of Diabetes:  Marland Kitchen Type 2  ASSESSMENT  Meghan Wilson is here for follow up  Weight 183 lb (83 kg). Body mass index is 32.42 kg/m.   Wt Readings from Last 5 Encounters:  06/17/19 183 lb (83 kg)  05/30/19 180 lb 11.2 oz (82 kg)  05/24/19 176 lb (79.8 kg)  05/22/19 176 lb (79.8 kg)  05/04/19 179 lb 14.4 oz (81.6 kg)    Lab Results  Component Value Date   HGBA1C 6.1 (A) 05/04/2019   She brought her meter today, she is checking her blood sugar a few times a week > Her blood sugars have increase since her last visit as seen below. Her weight has also increased. She was encouraged to decrease portions and increase activity to decrease weight and blood sugars.  10-1- 101 10-4 104 10-6 101 10-9 120 10-13 115 10-16 123  Diabetes Self-Management Education - 06/17/19 1100      Visit Information   Visit Type  Follow-up      Health Coping   How would you rate your overall health?  Fair   feels bloated since taking steroids     Complications   Have you had a dilated eye exam in the past 12 months?  Yes    Have you had a dental exam in the past 12 months?  Yes   May 09, 2019 Dr.Sharon Stokes   Are you checking your feet?  Yes    How many days per week are you checking your feet?  7      Individualized Goals (developed by patient)   Physical Activity  Exercise 3-5 times per week      Patient Self-Evaluation of Goals - Patient rates self as meeting previously set goals (% of time)   Monitoring  >75%      Subsequent Visit   Since your last visit have you continued or begun to take your medications as prescribed?  Yes    Since your last visit have you experienced any weight changes?  Gain    Weight Gain (lbs)  3    Since your last visit, are you checking your blood  glucose at least once a day?  No   suggested a few times a week      Individualized Plan for Diabetes Self-Management Training:   Learning Objective:  Patient will have a greater understanding of diabetes self-management. Patient education plan is to attend individual and/or group sessions per assessed needs and concerns.   Plan:   Patient Instructions  For constipation:  o Drink 64 fl oz or more of water and other low-calorie (10 calories or less per 8 fl oz) beverages o You can add 2 tablespoons per day of psyllium fiber (Metamucil) , to 8 ounces of any fluids you choose o Add chia seeds or freshly ground flax seeds to yogurt or oatmeal o Eat some prunes for snack  o Eat as much leafy green vegetables as possible (spinach, kale, collards, turnip and mustard greens); they provided a lot of fiber, and are a good source of magnesium as well. Add kale or spinach to smoothies with frozen berries (more fiber) if you do not like to eat greens. o Other magnesium-rich foods are: legumes (beans and lentils - also great sources of  fiber), nuts like almonds and cashews, avocado, yogurt, and milk. o Over time try to increase intake of vegetables and fruits to 1 or more cups each per day. This will probably take 6+ months.   It is recommended /you can get your pneumonia vaccine   you can get your kidney's checked next time you come by giving [place] urine.    Places to walk- off of [place] [address] Address: [address] [place], [place] [address]  Open ? Closes 5PM Phone: [phone]   Expected Outcomes:     Education material provided: Diabetes Resources  If problems or questions, patient to contact team via:  Phone  Future DSME appointment:  2 months Debera Lat, RD 06/17/2019 11:53 AM.

## 2019-06-17 NOTE — Telephone Encounter (Signed)
Requesta refill

## 2019-06-23 ENCOUNTER — Ambulatory Visit: Payer: Medicaid Other

## 2019-06-23 MED FILL — HYDROCODON-APAP 10-325: 10-325 | 5 days supply | Qty: 20 | Fill #0

## 2019-06-23 MED FILL — PENICILLIN VK 500 MG TABLET: 500 | 10 days supply | Qty: 40 | Fill #0

## 2019-06-30 ENCOUNTER — Telehealth: Payer: Self-pay | Admitting: Orthopedic Surgery

## 2019-06-30 NOTE — Telephone Encounter (Signed)
Patient left a message requesting to speak with you in regards to her medication.  CB#[phone].  Thank you.

## 2019-07-01 NOTE — Telephone Encounter (Signed)
I called and line is busy will hold message and try again later.

## 2019-07-01 NOTE — Telephone Encounter (Signed)
Autumn can you please call thank you

## 2019-07-04 ENCOUNTER — Other Ambulatory Visit: Payer: Self-pay

## 2019-07-04 DIAGNOSIS — G8911 Acute pain due to trauma: Secondary | ICD-10-CM

## 2019-07-04 NOTE — Telephone Encounter (Signed)
Patient was called and notified that per Dr Sharol Given would like a MRI of Lumbar done first before administering pain medications and to see her in office after she is seen by them. Patient understood and will set up appt after getting MRI done.

## 2019-07-08 MED FILL — metFORMIN HCL 500 MG TABS: 500 | 30 days supply | Qty: 60 | Fill #4

## 2019-07-08 MED FILL — lamoTRIgine 100 MG TABS: 100 | 30 days supply | Qty: 30 | Fill #2

## 2019-07-14 MED FILL — OMEPRAZOLE 20 MG CAPSULE DR: 20 | 30 days supply | Qty: 30 | Fill #2

## 2019-07-14 MED FILL — ATORVASTATIN 80 MG TABLET: 80 | 30 days supply | Qty: 30 | Fill #2

## 2019-07-15 MED FILL — AMOXICILLIN 500 MG CAPSULE: 500 | 7 days supply | Qty: 21 | Fill #0

## 2019-07-15 MED FILL — CHLORHEXIDINE 0.12% RINSE: 0.12 | 30 days supply | Qty: 473 | Fill #0

## 2019-07-15 MED FILL — oxyCODONE HCL 5 MG TABS: 5 | 1 days supply | Qty: 6 | Fill #0

## 2019-07-18 ENCOUNTER — Telehealth: Payer: Self-pay

## 2019-07-18 MED FILL — oxyCODONE HCL 5 MG TABS: 5 | 2 days supply | Qty: 12 | Fill #0

## 2019-07-18 NOTE — Telephone Encounter (Signed)
Patient was called and and informed that Medicaid denied her MRI. Patient will come in and get Lumbar Spine xray on Wednesday to see if that would help get her MRI approved.

## 2019-07-20 ENCOUNTER — Ambulatory Visit: Payer: Medicaid Other | Admitting: Family

## 2019-07-22 MED FILL — CHANTIX 1 MG CONT MONTH BOX: 1 | 28 days supply | Qty: 56 | Fill #1

## 2019-08-08 MED FILL — lamoTRIgine 100 MG TABS: 100 | 30 days supply | Qty: 30 | Fill #0

## 2019-08-12 ENCOUNTER — Ambulatory Visit: Payer: Medicaid Other | Admitting: Dietician

## 2019-08-19 ENCOUNTER — Other Ambulatory Visit: Payer: Self-pay | Admitting: Internal Medicine

## 2019-08-19 DIAGNOSIS — E785 Hyperlipidemia, unspecified: Secondary | ICD-10-CM

## 2019-08-19 MED FILL — ATORVASTATIN 80 MG TABLET: 80 | 30 days supply | Qty: 30 | Fill #0

## 2019-08-19 MED FILL — OMEPRAZOLE 20 MG CAPSULE DR: 20 | 30 days supply | Qty: 30 | Fill #3

## 2019-08-19 MED FILL — metFORMIN HCL 500 MG TABS: 500 | 30 days supply | Qty: 60 | Fill #5

## 2019-08-30 ENCOUNTER — Other Ambulatory Visit: Payer: Self-pay | Admitting: *Deleted

## 2019-08-30 ENCOUNTER — Telehealth: Payer: Self-pay | Admitting: *Deleted

## 2019-08-30 DIAGNOSIS — Z8739 Personal history of other diseases of the musculoskeletal system and connective tissue: Secondary | ICD-10-CM

## 2019-08-30 NOTE — Telephone Encounter (Signed)
Spoke with pt today to advise her again that her insurance denied her for MRI of lumbar spine due to not having xray done and that she need to have this done in order to get approval. Pt states she will try to come in tomorrow morning around 8am to get done. Order has been placed.

## 2019-09-08 ENCOUNTER — Telehealth: Payer: Self-pay | Admitting: Dietician

## 2019-09-08 NOTE — Telephone Encounter (Signed)
[facility] or to reschedule if she does not feel safe coming in.

## 2019-09-09 ENCOUNTER — Ambulatory Visit: Payer: Medicaid Other | Admitting: Dietician

## 2019-09-09 NOTE — Telephone Encounter (Signed)
Left voicemail for return call  

## 2019-09-16 ENCOUNTER — Other Ambulatory Visit: Payer: Self-pay | Admitting: Internal Medicine

## 2019-09-16 DIAGNOSIS — L209 Atopic dermatitis, unspecified: Secondary | ICD-10-CM

## 2019-09-16 MED FILL — TRIAMCINOLONE 0.5% OINTMENT: 0.5 | 15 days supply | Qty: 30 | Fill #0

## 2019-09-16 MED FILL — ATORVASTATIN 80 MG TABLET: 80 | 30 days supply | Qty: 30 | Fill #1

## 2019-09-16 MED FILL — OMEPRAZOLE DR 20 MG CAPSULE: 20 | 30 days supply | Qty: 30 | Fill #4

## 2019-09-16 MED FILL — lamoTRIgine 100 MG TABS: 100 | 30 days supply | Qty: 30 | Fill #1

## 2019-09-16 MED FILL — metFORMIN HCL 500 MG TABS: 500 | 30 days supply | Qty: 60 | Fill #6

## 2019-10-18 MED FILL — ATORVASTATIN CALCIUM 80 MG: 80 | 30 days supply | Qty: 30 | Fill #0

## 2019-10-18 MED FILL — OMEPRAZOLE DR 20 MG CAPSULE: 20 | 30 days supply | Qty: 30 | Fill #5

## 2019-10-24 MED FILL — lamoTRIgine 100 MG TABS: 100 | 30 days supply | Qty: 30 | Fill #0

## 2019-11-16 MED FILL — metFORMIN HCL 500 MG TABS: 500 | 30 days supply | Qty: 60 | Fill #7

## 2019-11-16 MED FILL — lamoTRIgine 100 MG TABS: 100 | 30 days supply | Qty: 30 | Fill #1

## 2019-11-23 ENCOUNTER — Other Ambulatory Visit: Payer: Self-pay

## 2019-11-23 ENCOUNTER — Ambulatory Visit: Payer: Medicaid Other | Admitting: Internal Medicine

## 2019-11-23 ENCOUNTER — Encounter: Payer: Self-pay | Admitting: Internal Medicine

## 2019-11-23 VITALS — BP 142/90 | HR 104 | Temp 98.4°F | Wt 180.5 lb

## 2019-11-23 DIAGNOSIS — R030 Elevated blood-pressure reading, without diagnosis of hypertension: Secondary | ICD-10-CM

## 2019-11-23 DIAGNOSIS — E119 Type 2 diabetes mellitus without complications: Secondary | ICD-10-CM

## 2019-11-23 DIAGNOSIS — K2970 Gastritis, unspecified, without bleeding: Secondary | ICD-10-CM

## 2019-11-23 DIAGNOSIS — Z79899 Other long term (current) drug therapy: Secondary | ICD-10-CM | POA: Diagnosis not present

## 2019-11-23 DIAGNOSIS — Z7984 Long term (current) use of oral hypoglycemic drugs: Secondary | ICD-10-CM

## 2019-11-23 DIAGNOSIS — G8921 Chronic pain due to trauma: Secondary | ICD-10-CM | POA: Diagnosis not present

## 2019-11-23 DIAGNOSIS — M25561 Pain in right knee: Secondary | ICD-10-CM | POA: Diagnosis not present

## 2019-11-23 DIAGNOSIS — K219 Gastro-esophageal reflux disease without esophagitis: Secondary | ICD-10-CM

## 2019-11-23 DIAGNOSIS — F3130 Bipolar disorder, current episode depressed, mild or moderate severity, unspecified: Secondary | ICD-10-CM

## 2019-11-23 DIAGNOSIS — F3131 Bipolar disorder, current episode depressed, mild: Secondary | ICD-10-CM

## 2019-11-23 LAB — POCT GLYCOSYLATED HEMOGLOBIN (HGB A1C): Hemoglobin A1C: 6 % — AB (ref 4.0–5.6)

## 2019-11-23 LAB — GLUCOSE, CAPILLARY: Glucose-Capillary: 114 mg/dL — ABNORMAL HIGH (ref 70–99)

## 2019-11-23 MED ORDER — GABAPENTIN 300 MG PO CAPS: 300.0000 mg | ORAL_CAPSULE | Freq: Three times a day (TID) | ORAL | 0 refills | Status: DC

## 2019-11-23 MED ORDER — PANTOPRAZOLE SODIUM 40 MG PO TBEC: 40.0000 mg | DELAYED_RELEASE_TABLET | Freq: Every day | ORAL | 1 refills | Status: DC

## 2019-11-23 MED FILL — GABAPENTIN 300 MG CAPSULE: 300 | 30 days supply | Qty: 90 | Fill #0

## 2019-11-23 MED FILL — PANTOPRAZOLE SOD DR 40 MG T: 40 | 30 days supply | Qty: 30 | Fill #0

## 2019-11-23 NOTE — Assessment & Plan Note (Signed)
She has constant pain in her right knee that is relieved only by aleve. She is taking 3 of aleve three times per day and it is the only thing that has helped her pain recently. She has seen by orthopedics and does not understand why she needs a lumbar xr and MRI. Discussed that some of her hip and knee pain may be from her back and it is very important she gets this imagine as well.    - stop aleve - start gabapentin 300 mg tid - f/u two weeks to assess pain  - f/u xr and MRI lumbar

## 2019-11-23 NOTE — Assessment & Plan Note (Signed)
BP Readings from Last 3 Encounters:  11/23/19 (!) 142/90  05/22/19 (!) 135/91  05/04/19 (!) 144/85   Blood pressure elevated today although she is distressed and in pain, tearful.   - BMP today  - f/u two weeks for bp check.

## 2019-11-23 NOTE — Assessment & Plan Note (Signed)
Last a1c 6.1, today is 6.0. She is currently taking metformin 500 mg bid. She denies symptoms of neuropathy.   - bmp - cont. Atorvastatin 80 - foot exam - microalbumin/cr ratio - f/u three months for a1c  - cont. Metformin 500 mg bid

## 2019-11-23 NOTE — Progress Notes (Signed)
   CC: type II diabetes  HPI:  Meghan Wilson is a 59 y.o. with PMH as below.   Please see A&P for assessment of the patient's acute and chronic medical conditions.   Past Medical History:  Diagnosis Date  . Bipolar depression ([place]) 09/2015  . GERD (gastroesophageal reflux disease)   . History of patellar fracture 2016  . History of tibial fracture 2016  . Hyperlipidemia   . Night terrors   . Night terrors, adult 09/2015   Review of Systems:   Review of Systems  Constitutional: Negative for chills, fever and weight loss.  Cardiovascular: Negative for chest pain, palpitations and leg swelling.  Gastrointestinal: Positive for heartburn. Negative for abdominal pain, blood in stool, melena, nausea and vomiting.  Genitourinary: Negative for dysuria, frequency and urgency.  Musculoskeletal: Positive for joint pain and myalgias. Negative for falls.  Neurological: Negative for sensory change, focal weakness and weakness.  Psychiatric/Behavioral: Positive for depression. Negative for suicidal ideas. The patient is not nervous/anxious.     Physical Exam: Constitution: tearful, appears stated age Cardio: RRR, no m/r/g, no LE edema  Respiratory: CTA, no w/r/r MSK: moving all extremities, right knee ttp laterally and medially, full active and passive ROM right knee.  Neuro: normal affect, a&ox3 Skin: c/d/i, knee surgery scars over right knee    Vitals:   11/23/19 1604  BP: (!) 142/90  Pulse: (!) 104  Temp: 98.4 F (36.9 C)  TempSrc: Oral  SpO2: 100%  Weight: 180 lb 8 oz (81.9 kg)      Assessment & Plan:   See Encounters Tab for problem based charting.  Patient discussed with Dr. Philipp Ovens

## 2019-11-23 NOTE — Assessment & Plan Note (Addendum)
She has felt more depressed recently. She thinks this has to do with covid, her chronic knee pain, and her grandson recently being very ill. She follows with [facility] and takes hydroxyzine for anxiety. She feels that her depression is situational. Discussing with her, it seems that controlling the underlying cause of her depression, which she feels is her pain, is the first step to helping her feel better emotionally. She denies SI or thoughts of hurting herself.    - will work on treating underlying etiologies and follow-up in two weeks.  - f/u with [facility] as needed

## 2019-11-23 NOTE — Assessment & Plan Note (Signed)
She has been taking aleve 3 pills three times per day. I think this is the equivalent of 660 mg three times per day. Discussed that is it important she stop taking this medication as it can irritate her stomach. She has had some irritation recently when she takes it and has tried to decrease the dose.   - stop aleve - stop prilosec, start protonix 40 mg   - f/u two weeks  - if no symptom relief will need GI follow-up

## 2019-11-23 NOTE — Patient Instructions (Signed)
Thank you for allowing [place] to provide your care today. Today we discussed your knee pain    I have ordered the following labs for you:    I will call if any are abnormal.    Today we made the following changes to your medications:   Please START taking  Gabapentin 300 mg - take one tablet three times per day.   Pantoprazole (protonix) 40 mg tablet - take one tablet per day  Please STOP taking   Aleve  Prilosec 20 mg   Please follow-up in two weeks to discuss your medication change and to recheck your blood pressure. Please follow-up in three months for a1c check.    Please call the internal medicine center clinic if you have any questions or concerns, we may be able to help and keep you from a long and expensive emergency room wait. Our clinic and after hours phone number is [phone], the best time to call is Monday through Friday 9 am to 4 pm but there is always someone available 24/7 if you have an emergency. If you need medication refills please notify your pharmacy one week in advance and they will send [place] a request.

## 2019-11-24 LAB — BMP8+ANION GAP
Anion Gap: 15 mmol/L (ref 10.0–18.0)
BUN/Creatinine Ratio: 20 (ref 9–23)
BUN: 14 mg/dL (ref 6–24)
CO2: 23 mmol/L (ref 20–29)
Calcium: 9.6 mg/dL (ref 8.7–10.2)
Chloride: 103 mmol/L (ref 96–106)
Creatinine, Ser: 0.71 mg/dL (ref 0.57–1.00)
GFR calc Af Amer: 108 mL/min/{1.73_m2} (ref >59)
GFR calc non Af Amer: 94 mL/min/{1.73_m2} (ref >59)
Glucose: 103 mg/dL — ABNORMAL HIGH (ref 65–99)
Potassium: 4 mmol/L (ref 3.5–5.2)
Sodium: 141 mmol/L (ref 134–144)

## 2019-11-24 NOTE — Progress Notes (Signed)
Internal Medicine Clinic Attending  Case discussed with Dr. Seawell at the time of the visit.  We reviewed the resident's history and exam and pertinent patient test results.  I agree with the assessment, diagnosis, and plan of care documented in the resident's note.    

## 2019-11-30 ENCOUNTER — Encounter: Payer: Self-pay | Admitting: Internal Medicine

## 2019-12-01 MED FILL — ATORVASTATIN 80 MG TABLET: 80 | 30 days supply | Qty: 30 | Fill #1

## 2019-12-05 ENCOUNTER — Other Ambulatory Visit: Payer: Self-pay

## 2019-12-05 ENCOUNTER — Encounter (HOSPITAL_COMMUNITY): Payer: Self-pay | Admitting: Emergency Medicine

## 2019-12-05 ENCOUNTER — Emergency Department (HOSPITAL_COMMUNITY)
Admission: EM | Admit: 2019-12-05 | Discharge: 2019-12-05 | Disposition: A | Discharge status: Home / Self Care | Payer: Medicaid Other | Attending: Emergency Medicine | Admitting: Emergency Medicine

## 2019-12-05 ENCOUNTER — Emergency Department (HOSPITAL_COMMUNITY): Payer: Medicaid Other

## 2019-12-05 DIAGNOSIS — S0990XA Unspecified injury of head, initial encounter: Secondary | ICD-10-CM | POA: Insufficient documentation

## 2019-12-05 DIAGNOSIS — Z7984 Long term (current) use of oral hypoglycemic drugs: Secondary | ICD-10-CM | POA: Diagnosis not present

## 2019-12-05 DIAGNOSIS — W19XXXA Unspecified fall, initial encounter: Secondary | ICD-10-CM | POA: Diagnosis not present

## 2019-12-05 DIAGNOSIS — R42 Dizziness and giddiness: Secondary | ICD-10-CM | POA: Insufficient documentation

## 2019-12-05 DIAGNOSIS — R110 Nausea: Secondary | ICD-10-CM | POA: Diagnosis not present

## 2019-12-05 DIAGNOSIS — Y999 Unspecified external cause status: Secondary | ICD-10-CM | POA: Insufficient documentation

## 2019-12-05 DIAGNOSIS — E119 Type 2 diabetes mellitus without complications: Secondary | ICD-10-CM | POA: Insufficient documentation

## 2019-12-05 DIAGNOSIS — Y9389 Activity, other specified: Secondary | ICD-10-CM | POA: Insufficient documentation

## 2019-12-05 DIAGNOSIS — Y92099 Unspecified place in other non-institutional residence as the place of occurrence of the external cause: Secondary | ICD-10-CM | POA: Diagnosis not present

## 2019-12-05 HISTORY — DX: Elevated blood-pressure reading, without diagnosis of hypertension: R03.0

## 2019-12-05 LAB — URINALYSIS, ROUTINE W REFLEX MICROSCOPIC
Bilirubin Urine: NEGATIVE
Glucose, UA: NEGATIVE mg/dL
Hgb urine dipstick: NEGATIVE
Ketones, ur: NEGATIVE mg/dL
Leukocytes,Ua: NEGATIVE
Nitrite: NEGATIVE
Protein, ur: NEGATIVE mg/dL
Specific Gravity, Urine: 1.016 (ref 1.005–1.030)
pH: 5 (ref 5.0–8.0)

## 2019-12-05 LAB — CBC WITH DIFFERENTIAL/PLATELET
Abs Immature Granulocytes: 0.02 10*3/uL (ref 0.00–0.07)
Basophils Absolute: 0 10*3/uL (ref 0.0–0.1)
Basophils Relative: 0 %
Eosinophils Absolute: 0.2 10*3/uL (ref 0.0–0.5)
Eosinophils Relative: 2 %
HCT: 42 % (ref 36.0–46.0)
Hemoglobin: 13.2 g/dL (ref 12.0–15.0)
Immature Granulocytes: 0 %
Lymphocytes Relative: 38 %
Lymphs Abs: 3.1 10*3/uL (ref 0.7–4.0)
MCH: 27.2 pg (ref 26.0–34.0)
MCHC: 31.4 g/dL (ref 30.0–36.0)
MCV: 86.6 fL (ref 80.0–100.0)
Monocytes Absolute: 1 10*3/uL (ref 0.1–1.0)
Monocytes Relative: 12 %
Neutro Abs: 3.9 10*3/uL (ref 1.7–7.7)
Neutrophils Relative %: 48 %
Platelets: 292 10*3/uL (ref 150–400)
RBC: 4.85 MIL/uL (ref 3.87–5.11)
RDW: 15.1 % (ref 11.5–15.5)
WBC: 8.2 10*3/uL (ref 4.0–10.5)
nRBC: 0 % (ref 0.0–0.2)

## 2019-12-05 LAB — BASIC METABOLIC PANEL
Anion gap: 8 (ref 5–15)
BUN: 15 mg/dL (ref 6–20)
CO2: 29 mmol/L (ref 22–32)
Calcium: 9.4 mg/dL (ref 8.9–10.3)
Chloride: 104 mmol/L (ref 98–111)
Creatinine, Ser: 0.53 mg/dL (ref 0.44–1.00)
GFR calc Af Amer: >60 mL/min (ref >60)
GFR calc non Af Amer: >60 mL/min (ref >60)
Glucose, Bld: 110 mg/dL — ABNORMAL HIGH (ref 70–99)
Potassium: 3.6 mmol/L (ref 3.5–5.1)
Sodium: 141 mmol/L (ref 135–145)

## 2019-12-05 MED ORDER — MECLIZINE HCL 25 MG PO TABS
25.0000 mg | ORAL_TABLET | Freq: Once | ORAL | Status: AC
  Administered 2019-12-05: 25 mg via ORAL
  Filled 2019-12-05: qty 1

## 2019-12-05 MED ORDER — MECLIZINE HCL 12.5 MG PO TABS: 12.5000 mg | ORAL_TABLET | Freq: Three times a day (TID) | ORAL | 0 refills | Status: DC | PRN

## 2019-12-05 MED ORDER — SODIUM CHLORIDE 0.9 % IV BOLUS
500.0000 mL | Freq: Once | INTRAVENOUS | Status: AC
  Administered 2019-12-05: 500 mL via INTRAVENOUS

## 2019-12-05 NOTE — ED Triage Notes (Signed)
Pt reports on March 11 and hit her head on window when tripped over leg of the bed. Reports had head pains but denies LOC. Reports had some dizziness since.

## 2019-12-05 NOTE — Discharge Instructions (Addendum)
Please return for any problem.  Follow-up with your regular care provider as instructed.  Use Antivert as prescribed for dizziness if this reoccurs.  If you have persistence or recurrent symptoms it is important to follow-up with neurology as an outpatient as instructed.  Further work-up may include imaging studies such as an MRI of your brain.  If you have significant symptoms prior to outpatient follow-up please return to the ED for further evaluation.

## 2019-12-05 NOTE — ED Provider Notes (Signed)
[facility] DEPT Provider Note   CSN: [id number] Arrival date & time: 12/05/19  1631     History Chief Complaint  Patient presents with  . Fall  . Head Injury  . Dizziness    Meghan Wilson is a 59 y.o. female.  59 year old female with prior medical history as described below presents for evaluation of dizziness.  Patient reports dizziness and vertiginous symptoms over the last 2 to 3 days.  Patient symptoms are intermittent.  Patient reports that she has mild nausea with the symptoms.  Symptoms are worse with rapid standing or movement of her head.  She does report a minor head injury on March 11.  She did not seek medical attention after that injury.  She did not pass out.  She struck her head after a fall from ground-level at home.  She is concerned that the head injury may be the cause of her vertiginous symptoms currently.  She denies visual change, nausea currently, vomiting, focal weakness, speech change, or other complaint.  She does not take anything at home for her symptoms.  The history is provided by the patient and medical records.  Dizziness Quality:  Head spinning and vertigo Severity:  Mild Onset quality:  Gradual Duration:  3 days Timing:  Intermittent Progression:  Waxing and waning Chronicity:  New Relieved by:  Nothing Worsened by:  Nothing      Past Medical History:  Diagnosis Date  . Bipolar depression ([place]) 09/2015  . Elevated blood pressure, situational   . GERD (gastroesophageal reflux disease)   . History of patellar fracture 2016  . History of tibial fracture 2016  . Hyperlipidemia   . Night terrors   . 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 Episodic tension-type headache, not intractable 05/04/2019  . Melena 05/04/2019  . Type II diabetes mellitus ([place]) 02/16/2019  . Elevated blood pressure, situational 05/06/2018  . Pain of right knee after injury 03/10/2018  .  Atopic dermatitis 02/15/2018  . 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 Encounter for smoking cessation counseling 01/02/2018  . Hyperlipidemia 01/01/2018  . Peptic gastritis 01/01/2018  . Morbid obesity ([place]) 08/03/2015  . Bipolar disorder ([place]) 08/03/2015    Past Surgical History:  Procedure Laterality Date  . CHOLECYSTECTOMY    . JOINT REPLACEMENT       OB History    Gravida  3   Para      Term      Preterm      AB  1   Living  2     SAB  1   TAB      Ectopic      Multiple      Live Births  2           Family History  Problem Relation Age of Onset  . Hyperlipidemia Mother   . Hypertension Mother   . Arthritis/Rheumatoid Father   . Hyperlipidemia Maternal Aunt   . Hypertension Maternal Aunt   . Cancer Maternal Aunt     Social History   Tobacco Use  . Smoking status: Former Smoker    Packs/day: 0.25    Years: 4.00    Pack years: 1.00    Quit date: 12/21/2017    Years since quitting: 1.9  . Smokeless tobacco: Never Used  Substance Use Topics  . Alcohol use: Yes    Comment: special occasions  . Drug  use: No    Home Medications Prior to Admission medications   Medication Sig Start Date End Date Taking? Authorizing Provider  Accu-Chek FastClix Lancets MISC Check blood sugar up to 7 times a week 06/17/19   Seawell, Jaimie A, DO  atorvastatin (LIPITOR) 80 MG tablet TAKE 1 TABLET (80 MG TOTAL) BY MOUTH DAILY. 08/19/19   Seawell, Jaimie A, DO  fluticasone (FLONASE) 50 MCG/ACT nasal spray Place 1 spray into both nostrils daily as needed for allergies or rhinitis.    [provider]  gabapentin (NEURONTIN) 300 MG capsule Take 1 capsule (300 mg total) by mouth 3 (three) times daily. 11/23/19   Seawell, Jaimie A, DO  glucose blood (ACCU-CHEK GUIDE) test strip Check blood sugar up to 7 times per week 06/17/19   Seawell, Jaimie A, DO  hydrOXYzine (ATARAX/VISTARIL) 10 MG tablet Take 10 mg by mouth 2 (two) times daily as needed for  anxiety. 04/18/19   [provider]  lamoTRIgine (LAMICTAL) 100 MG tablet Take 100 mg by mouth daily.  12/10/17   [provider]  meclizine (ANTIVERT) 12.5 MG tablet Take 1 tablet (12.5 mg total) by mouth 3 (three) times daily as needed for dizziness. 12/05/19   Valarie Merino, MD  metFORMIN (GLUCOPHAGE) 500 MG tablet Take 1 tablet (500 mg total) by mouth 2 (two) times daily with a meal. Take 1 tablet daily for first week, then 2 times daily thereafter. 02/15/19 02/15/20  [place], Carley D, DO  pantoprazole (PROTONIX) 40 MG tablet Take 1 tablet (40 mg total) by mouth daily. 11/23/19 11/22/20  Seawell, Jaimie A, DO  senna-docusate (SENOKOT-S) 8.6-50 MG tablet Take 1-2 tablets by mouth 2 (two) times daily as needed for mild constipation or moderate constipation. 05/22/19   Gareth Morgan, MD  triamcinolone ointment (KENALOG) 0.5 % APPLY TO AREAS OF HAND WITH RASH TWICE DAILY. 09/16/19   Seawell, Jaimie A, DO  varenicline (CHANTIX CONTINUING MONTH PAK) 1 MG tablet Take 1 tablet (1 mg total) by mouth 2 (two) times daily. 05/05/19   Seawell, Jaimie A, DO    Allergies    Depakote [divalproex sodium], Ibuprofen, and Latex  Review of Systems   Review of Systems  Neurological: Positive for dizziness.  All other systems reviewed and are negative.   Physical Exam Updated Vital Signs BP 116/82   Pulse 89   Temp 98.6 F (37 C) (Oral)   Resp (!) 25   SpO2 98%   Physical Exam Vitals and nursing note reviewed.  Constitutional:      General: She is not in acute distress.    Appearance: Normal appearance. She is well-developed.  HENT:     Head: Normocephalic and atraumatic.  Eyes:     Conjunctiva/sclera: Conjunctivae normal.     Pupils: Pupils are equal, round, and reactive to light.  Cardiovascular:     Rate and Rhythm: Normal rate and regular rhythm.     Heart sounds: Normal heart sounds.  Pulmonary:     Effort: Pulmonary effort is normal. No respiratory distress.     Breath  sounds: Normal breath sounds.  Abdominal:     General: There is no distension.     Palpations: Abdomen is soft.     Tenderness: There is no abdominal tenderness.  Musculoskeletal:        General: No deformity. Normal range of motion.     Cervical back: Normal range of motion and neck supple.  Skin:    General: Skin is warm and dry.  Neurological:     General: No focal deficit present.     Mental Status: She is alert and oriented to person, place, and time. Mental status is at baseline.     Cranial Nerves: No cranial nerve deficit.     Sensory: No sensory deficit.     Motor: No weakness.     Coordination: Coordination normal.     Gait: Gait normal.     ED Results / Procedures / Treatments   Labs (all labs ordered are listed, but only abnormal results are displayed) Labs Reviewed  BASIC METABOLIC PANEL - Abnormal; Notable for the following components:      Result Value   Glucose, Bld 110 (*)    All other components within normal limits  CBC WITH DIFFERENTIAL/PLATELET  URINALYSIS, ROUTINE W REFLEX MICROSCOPIC    EKG None  Radiology CT Head Wo Contrast  Result Date: 12/05/2019 CLINICAL DATA:  59 year old female with vertigo. EXAM: CT HEAD WITHOUT CONTRAST TECHNIQUE: Contiguous axial images were obtained from the base of the skull through the vertex without intravenous contrast. COMPARISON:  None. FINDINGS: Brain: The ventricles and sulci appropriate size for patient's age. Minimal periventricular and deep white matter chronic microvascular ischemic changes. There is no acute intracranial hemorrhage. No mass effect or midline shift. No extra-axial fluid collection. There is a 1 x 1 cm apparent rounded structure to the right of the right cerebellopontine angle most likely an artifact and confluent of vascular structure. A mass is less likely. IAC protocol brain MRI without and with contrast may provide better evaluation if clinically indicated and on a nonemergent/outpatient basis.  Vascular: No hyperdense vessel or unexpected calcification. Skull: Normal. Negative for fracture or focal lesion. Sinuses/Orbits: No acute finding. Other: None IMPRESSION: 1. No acute intracranial pathology. 2. Volume averaging artifact versus much less likely a right CPA lesion. Please see above. Electronically Signed   By: Anner Crete M.D.   On: 12/05/2019 18:43    Procedures Procedures (including critical care time)  Medications Ordered in ED Medications  sodium chloride 0.9 % bolus 500 mL (0 mLs Intravenous Stopped 12/05/19 1809)  meclizine (ANTIVERT) tablet 25 mg (25 mg Oral Given 12/05/19 1735)    ED Course  I have reviewed the triage vital signs and the nursing notes.  Pertinent labs & imaging results that were available during my care of the patient were reviewed by me and considered in my medical decision making (see chart for details).    MDM Rules/Calculators/A&P                      MDM  Screen complete  Meghan Wilson was evaluated in Emergency Department on 12/05/2019 for the symptoms described in the history of present illness. She was evaluated in the context of the global COVID-19 pandemic, which necessitated consideration that the patient might be at risk for infection with the SARS-CoV-2 virus that causes COVID-19. Institutional protocols and algorithms that pertain to the evaluation of patients at risk for COVID-19 are in a state of rapid change based on information released by regulatory bodies including the CDC and federal and state organizations. These policies and algorithms were followed during the patient's care in the ED.   Patient is presenting for evaluation of vertiginous symptoms.  Patient's exam is without evidence of significant neurologic abnormality.  Patient describes symptoms are consistent with likely peripheral vertigo.  Imaging did not reveal acute pathology.  Possible artifact as documented on CT imaging is discussed with the  patient.  Following  administration of Antivert in the ED the patient's dizziness is resolved.  She now feels improved.  She desires discharge.  Patient does understand the need for close follow-up with neurology.  She is advised that if her symptoms recur or are persistent she will need further imaging including an MRI of her brain.  Importance of close follow-up was stressed repeatedly.  Strict return precautions given and understood. Final Clinical Impression(s) / ED Diagnoses Final diagnoses:  Dizziness    Rx / DC Orders ED Discharge Orders         Ordered    meclizine (ANTIVERT) 12.5 MG tablet  3 times daily PRN     12/05/19 1942           Valarie Merino, MD 12/05/19 1948

## 2019-12-09 ENCOUNTER — Telehealth: Payer: Self-pay | Admitting: Internal Medicine

## 2019-12-09 NOTE — Telephone Encounter (Signed)
RTC; pt states she fell and hit her head and was evaluated in [facility] ER on 12/05/19.  Pt states she was told to call [facility] for a f/u appt.  She is unsure if she needs a referral to neurology, but states she is still having some head pain to the right side of her head above her eye and feels "hazy" at times.  Offered pt an appt in [facility], she states she works and request an appt in the afternoon.  [facility] appt made for Monday, 12/12/19 @ 3:15.  Pt instructed if she has any worsening head pain, visual disturbances, balance issues, impaired cognitive functioning to present to ED and she verbalized understanding.  SChaplin, RN,BSN

## 2019-12-09 NOTE — Telephone Encounter (Signed)
Also needs refill fluticasone (FLONASE) 50 MCG/ACT nasal spray ;pt contact [place], [place] - [address] [facility].

## 2019-12-09 NOTE — Telephone Encounter (Signed)
Pls contact pt regarding fall [phone]

## 2019-12-12 ENCOUNTER — Ambulatory Visit: Payer: Medicaid Other | Admitting: Internal Medicine

## 2019-12-12 ENCOUNTER — Other Ambulatory Visit: Payer: Self-pay

## 2019-12-12 VITALS — BP 119/88 | HR 93 | Temp 98.1°F | Ht 63.0 in | Wt 182.5 lb

## 2019-12-12 DIAGNOSIS — S0990XD Unspecified injury of head, subsequent encounter: Secondary | ICD-10-CM | POA: Diagnosis present

## 2019-12-12 DIAGNOSIS — W19XXXD Unspecified fall, subsequent encounter: Secondary | ICD-10-CM

## 2019-12-12 NOTE — Progress Notes (Signed)
   CC: Fall  HPI:  Meghan Wilson is a 59 y.o. female, with a PMH below, who presents to the clinic with after having a fall a month ago. To see the management of her acute and chronic conditions, please refer to the A&P note.   Past Medical History:  Diagnosis Date  . Bipolar depression ([place]) 09/2015  . Elevated blood pressure, situational   . GERD (gastroesophageal reflux disease)   . History of patellar fracture 2016  . History of tibial fracture 2016  . Hyperlipidemia   . Night terrors   . Night terrors, adult 09/2015   Review of Systems:  Review of Systems  Constitutional: Negative for chills, fever and weight loss.  HENT: Negative for congestion, hearing loss, nosebleeds and sinus pain.   Eyes: Negative for blurred vision and double vision.  Respiratory: Negative for stridor.   Cardiovascular: Negative for palpitations.  Gastrointestinal: Positive for nausea. Negative for abdominal pain, constipation, diarrhea and vomiting.  Neurological: Positive for dizziness and headaches.    Physical Exam:  Vitals:   12/12/19 1527 12/12/19 1529  BP:  119/88  Pulse:  93  Temp:  98.1 F (36.7 C)  TempSrc:  Oral  SpO2:  99%  Weight: 182 lb 8 oz (82.8 kg)   Height: 5\' 3"  (1.6 m)    Physical Exam Vitals and nursing note reviewed.  Constitutional:      General: She is not in acute distress.    Appearance: Normal appearance. She is not ill-appearing or toxic-appearing.  HENT:     Head: Normocephalic and atraumatic.  Eyes:     General:        Right eye: No discharge.        Left eye: No discharge.     Extraocular Movements: Extraocular movements intact.     Conjunctiva/sclera: Conjunctivae normal.     Pupils: Pupils are equal, round, and reactive to light.  Cardiovascular:     Rate and Rhythm: Normal rate and regular rhythm.     Pulses: Normal pulses.     Heart sounds: Normal heart sounds. No murmur. No friction rub. No gallop.   Pulmonary:     Effort: Pulmonary effort  is normal.     Breath sounds: Normal breath sounds. No wheezing, rhonchi or rales.  Abdominal:     General: Bowel sounds are normal.     Palpations: Abdomen is soft.     Tenderness: There is no abdominal tenderness. There is no guarding.  Musculoskeletal:        General: No swelling.     Right lower leg: No edema.     Left lower leg: No edema.     Comments: 5/5 strength in UE and LE bilaterally  Neurological:     General: No focal deficit present.     Mental Status: She is alert and oriented to person, place, and time.     Comments: CNII-XII grossly intact bilaterally  Rapid forearm rotation negative  Psychiatric:        Mood and Affect: Mood normal.        Behavior: Behavior normal.     Assessment & Plan:   See Encounters Tab for problem based charting.  Patient discussed with Dr. Lynnae January

## 2019-12-12 NOTE — Patient Instructions (Addendum)
To Ms. Byard, It was a pleasure working with you today. Today we discussed your fall. We will refer you to neurology and physical therapy that specializes with your symptoms. We will have you follow up with your PCP in three months. Have a good day! Sincerely,  Maudie Mercury, MD

## 2019-12-13 ENCOUNTER — Encounter: Payer: Self-pay | Admitting: Internal Medicine

## 2019-12-13 DIAGNOSIS — S0990XA Unspecified injury of head, initial encounter: Secondary | ICD-10-CM | POA: Insufficient documentation

## 2019-12-13 NOTE — Assessment & Plan Note (Addendum)
Patient states that she had a fall and hit her head in March of 2021. Since then she has felt nauseated and experiencing vertigo. She was evaluated at [facility] and had a CT head that did not show any bleeding. She was given meclizine which has provided relief from her vertigo. She was also told to follow with neurology by the [facility]. She endorses still having intermittent headaches and nausea since her evaluation at the [facility] and comes requesting an MRI.   Assessment/Plan:  Physical examination findings are reassuring, discussed with patient that MRI is not needed at this time.  Plan:  - Neurology referral  - Vestibular rehab referral

## 2019-12-14 MED FILL — IBUPROFEN 800 MG TABS: 800 | 10 days supply | Qty: 30 | Fill #0

## 2019-12-15 ENCOUNTER — Ambulatory Visit: Payer: Self-pay | Admitting: *Deleted

## 2019-12-15 ENCOUNTER — Telehealth: Payer: Self-pay

## 2019-12-15 NOTE — Telephone Encounter (Signed)
Pt states she is having headache and feeling dizzy, please call pt back.

## 2019-12-15 NOTE — Telephone Encounter (Signed)
I think she was seen in the [facility] clinic for her fall and head injury. I agree if she is having worsening of symptoms it is reasonable to follow-up in [facility] again or to go to the ER.

## 2019-12-15 NOTE — Telephone Encounter (Signed)
Pt calling, angry affect.   States she fell 11/09/2019, seen in ED 12/05/2019, headache, nausea, vertigo. CT reported negative, recommended MRI,states referral was denied by PCP.  Pt referred to neurologist, has first available appt 01/24/2020. Pt states "Headache, nausea, dizziness no better." States has not worsened. Pt questioning why referral denied and why she can not see neurologist sooner.  Advised pt to call PCP to discuss issues. Also advised call neurologist to see if she can be placed on call list or seen earlier.   Also advised ED for severe headache, worsening of symptoms or if she desires to discuss with ED MD.  Pt hung up before completing advise.

## 2019-12-16 ENCOUNTER — Ambulatory Visit (INDEPENDENT_AMBULATORY_CARE_PROVIDER_SITE_OTHER): Payer: Medicaid Other | Admitting: Internal Medicine

## 2019-12-16 DIAGNOSIS — F0781 Postconcussional syndrome: Secondary | ICD-10-CM | POA: Diagnosis not present

## 2019-12-16 DIAGNOSIS — S0990XD Unspecified injury of head, subsequent encounter: Secondary | ICD-10-CM

## 2019-12-16 NOTE — Progress Notes (Signed)
  [facility] Telephone Encounter Continuity Care Appointment  HPI:   This telephone encounter was created for [name] on 12/16/2019 for the following purpose/cc dizziness.   Past Medical History:  Past Medical History:  Diagnosis Date  . Bipolar depression ([place]) 09/2015  . Elevated blood pressure, situational   . GERD (gastroesophageal reflux disease)   . History of patellar fracture 2016  . History of tibial fracture 2016  . Hyperlipidemia   . Night terrors   . Night terrors, adult 09/2015      ROS:   Denies vision changes, changes in hearing, worsening headaches, numbness/tingling, focal weakness.     Assessment / Plan / Recommendations:   Please see A&P under problem oriented charting for assessment of the patient's acute and chronic medical conditions.   As always, pt is advised that if symptoms worsen or new symptoms arise, they should go to an urgent care facility or to to ER for further evaluation.   Consent and Medical Decision Making:   Patient discussed with Dr. Angelia Mould  This is a telephone encounter between [name] and Delice Bison on 12/16/2019 for dizziness. The visit was conducted with the patient located at home and Delice Bison at [facility]. The patient's identity was confirmed using their DOB and current address. The patient has consented to being evaluated through a telephone encounter and understands the associated risks (an examination cannot be done and the patient may need to come in for an appointment) / benefits (allows the patient to remain at home, decreasing exposure to coronavirus). I personally spent 29 minutes on medical discussion.

## 2019-12-16 NOTE — Assessment & Plan Note (Deleted)
Meghan Wilson has had persistent intermittent dizziness and headaches located behind her left eye since hitting her head during a ground level fall approximately 1 month ago.

## 2019-12-16 NOTE — Telephone Encounter (Signed)
Patient is scheduled in [facility] today. Hubbard Hartshorn, BSN, RN-BC

## 2019-12-19 NOTE — Progress Notes (Signed)
Internal Medicine Clinic Attending  Case discussed with Dr. Winters at the time of the visit.  We reviewed the resident's history and exam and pertinent patient test results.  I agree with the assessment, diagnosis, and plan of care documented in the resident's note.  

## 2019-12-20 ENCOUNTER — Encounter: Payer: Self-pay | Admitting: Internal Medicine

## 2019-12-20 DIAGNOSIS — F0781 Postconcussional syndrome: Secondary | ICD-10-CM

## 2019-12-20 HISTORY — DX: Postconcussional syndrome: F07.81

## 2019-12-20 NOTE — Assessment & Plan Note (Addendum)
Meghan Wilson has had persistent intermittent dizziness and headaches located behind her left eye since hitting her head during a ground level fall approximately 1 month ago. She has not had any change in the nature of the headache which lasts for a few seconds. The dizziness is described as disequilibrium and lasts for larger portions of the day. She has not had any worsening of her symptoms, memory issues, vision loss, focal weakness, numbness/tingling. She does endorse slowed speech and difficulty concentrating. She was seen in clinic 2 days ago and was referred to neurology, but was confused why an MRI had not been ordered.  I explained the thought process and offered that we could go ahead and get the MRI prior to evaluation by neurology or await their opinion. After learning that her symptoms can be part of a post concussive syndrome and can last up to several weeks, she elected to wait on further imaging.  I encouraged her to seek urgent evaluation if she notices any change in her symptoms whatsoever and will need to pursue MRI at that point.

## 2019-12-21 NOTE — Progress Notes (Signed)
Internal Medicine Clinic Attending  Case discussed with Dr. Bloomfield at the time of the visit.  We reviewed the resident's history and exam and pertinent patient test results.  I agree with the assessment, diagnosis, and plan of care documented in the resident's note.  

## 2019-12-23 MED FILL — lamoTRIgine 100 MG TABS: 100 | 30 days supply | Qty: 30 | Fill #2

## 2019-12-23 MED FILL — PANTOPRAZOLE SOD DR 40 MG T: 40 | 30 days supply | Qty: 30 | Fill #1

## 2019-12-23 MED FILL — metFORMIN HCL 500 MG TABS: 500 | 30 days supply | Qty: 60 | Fill #8

## 2019-12-30 MED FILL — ATORVASTATIN 80 MG TABLET: 80 | 30 days supply | Qty: 30 | Fill #2

## 2020-01-02 NOTE — Addendum Note (Signed)
Addended by: Molli Hazard A on: 01/02/2020 04:06 PM   Modules accepted: Orders

## 2020-01-13 ENCOUNTER — Telehealth: Payer: Self-pay | Admitting: Dietician

## 2020-01-13 NOTE — Telephone Encounter (Signed)
Spoke with the pt.  Appt sch for 01/27/2020.

## 2020-01-13 NOTE — Telephone Encounter (Signed)
Ms. Gervasi leaves a voicemail asking to set up an appointment

## 2020-01-23 MED FILL — HYDROCODON-APAP 10-325: 10-325 | 5 days supply | Qty: 20 | Fill #0

## 2020-01-23 MED FILL — PENICILLIN VK 500 MG TABLET: 500 | 10 days supply | Qty: 40 | Fill #0

## 2020-01-24 ENCOUNTER — Encounter: Payer: Self-pay | Admitting: Neurology

## 2020-01-24 ENCOUNTER — Telehealth: Payer: Self-pay | Admitting: Neurology

## 2020-01-24 ENCOUNTER — Ambulatory Visit: Payer: Medicaid Other | Admitting: Neurology

## 2020-01-24 ENCOUNTER — Other Ambulatory Visit: Payer: Self-pay

## 2020-01-24 VITALS — BP 124/72 | HR 93 | Ht 63.0 in | Wt 186.5 lb

## 2020-01-24 DIAGNOSIS — G44301 Post-traumatic headache, unspecified, intractable: Secondary | ICD-10-CM | POA: Diagnosis not present

## 2020-01-24 DIAGNOSIS — S060X9S Concussion with loss of consciousness of unspecified duration, sequela: Secondary | ICD-10-CM

## 2020-01-24 DIAGNOSIS — R930 Abnormal findings on diagnostic imaging of skull and head, not elsewhere classified: Secondary | ICD-10-CM | POA: Diagnosis not present

## 2020-01-24 DIAGNOSIS — R519 Headache, unspecified: Secondary | ICD-10-CM | POA: Diagnosis not present

## 2020-01-24 MED ORDER — ONDANSETRON 4 MG PO TBDP: 4.0000 mg | ORAL_TABLET | Freq: Three times a day (TID) | ORAL | 6 refills | Status: DC | PRN

## 2020-01-24 MED ORDER — NORTRIPTYLINE HCL 10 MG PO CAPS: 20.0000 mg | ORAL_CAPSULE | Freq: Every day | ORAL | 11 refills | Status: DC

## 2020-01-24 MED ORDER — SUMATRIPTAN SUCCINATE 50 MG PO TABS: 50.0000 mg | ORAL_TABLET | ORAL | 6 refills | Status: DC | PRN

## 2020-01-24 MED FILL — NORTRIPTYLINE HCL 10 MG CAP: 10 | 30 days supply | Qty: 60 | Fill #0

## 2020-01-24 MED FILL — ONDANSETRON ODT 4 MG TABLET: 4 | 7 days supply | Qty: 20 | Fill #0

## 2020-01-24 NOTE — Patient Instructions (Signed)
Take nortriptyline 2 tablets every night, starting with 10 mg 1 tablets every night for 1 week, then titrating to 2 tablets every night   When to get moderate to severe headache, you may take Imitrex 50 mg as needed, Can mix it with Aleve 1 to 2 tablets Zofran for nausea Sleep usually helps

## 2020-01-24 NOTE — Telephone Encounter (Signed)
Medicaid order sent to [facility]. They will obtain the auth and reach out to the patient to schedule.  

## 2020-01-24 NOTE — Progress Notes (Signed)
PATIENT: Meghan Wilson DOB: Oct 18, 1960  Chief Complaint  Patient presents with  . Dizziness    Orthostatic Vitals: Lying: 124/72, 93, Sitting: 132/78,100, Standing: 129/76, 92, Standing x 3: 134/81, 95. Follow up from ED visit on 12/05/19. Reports intermittent dizziness and feelings of being off balance. States she fell at home and hit her head on a baseboad heater on 11/10/19. Her symptoms started after this accident. Meclizine makes her sleepy but does not really improve her dizziness.   Marland Kitchen PCP    Seawell, Laurell Roof, DO (referred from hospital)     HISTORICAL  Meghan Wilson is a 59 year old female, seen in request by her primary care physician Dr. Sharon Wilson, Andris Baumann for evaluation of dizziness, frequent left-sided headaches, initial evaluation was on Jan 24, 2020.  I have reviewed and summarized the referring note from the referring physician.  She had a past medical history of bipolar disorder, is taking lamotrigine 100 mg every night, also gabapentin 300 mg 3 times a day, also had a past medical history of hyperlipidemia, diabetes,  She tripped and fell forward bumped her left temporoparietal region to her metal heater in March 2021, she might have transient loss of consciousness, she had lost some recollection of the event following her fall, since the injury, she began to notice almost daily left frontotemporal region tension, pressure, sometimes involving into more significant pain, with associated light, noise sensitivity, movement make it worse, mild nausea sometimes  She presented to emergency room on December 05, 2019 due to left-sided headaches, dizziness, unsteadiness sensation  She also complains of long history of gradual onset bilateral tinnitus, right worse than left, she denies significant gait abnormality, denies hearing loss  I personally reviewed CT head without contrast, no acute abnormality, mild abnormal signal at right CP angle, rounded structure, artifact versus confluence of  vascular structures  Laboratory evaluation in April 2021, normal CBC, BMP showed elevated glucose 110, creatinine of 0.53, A1c 6.0,  REVIEW OF SYSTEMS: Full 14 system review of systems performed and notable only for as above  All other review of systems were negative.  ALLERGIES: Allergies  Allergen Reactions  . Depakote [Divalproex Sodium] Other (See Comments)    Hair fell out  . Ibuprofen Nausea And Vomiting  . Latex Rash    HOME MEDICATIONS: Current Outpatient Medications  Medication Sig Dispense Refill  . Accu-Chek FastClix Lancets MISC Check blood sugar up to 7 times a week 102 each 3  . atorvastatin (LIPITOR) 80 MG tablet TAKE 1 TABLET (80 MG TOTAL) BY MOUTH DAILY. 30 tablet 11  . fluticasone (FLONASE) 50 MCG/ACT nasal spray Place 1 spray into both nostrils daily as needed for allergies or rhinitis.    Marland Kitchen gabapentin (NEURONTIN) 300 MG capsule Take 1 capsule (300 mg total) by mouth 3 (three) times daily. 90 capsule 0  . glucose blood (ACCU-CHEK GUIDE) test strip Check blood sugar up to 7 times per week 100 each 3  . lamoTRIgine (LAMICTAL) 100 MG tablet Take 100 mg by mouth daily.   4  . meclizine (ANTIVERT) 12.5 MG tablet Take 1 tablet (12.5 mg total) by mouth 3 (three) times daily as needed for dizziness. 30 tablet 0  . metFORMIN (GLUCOPHAGE) 500 MG tablet Take 1 tablet (500 mg total) by mouth 2 (two) times daily with a meal. Take 1 tablet daily for first week, then 2 times daily thereafter. 60 tablet 11  . pantoprazole (PROTONIX) 40 MG tablet Take 1 tablet (40 mg total) by mouth daily.  30 tablet 1  . senna-docusate (SENOKOT-S) 8.6-50 MG tablet Take 1-2 tablets by mouth 2 (two) times daily as needed for mild constipation or moderate constipation. 30 tablet 0   No current facility-administered medications for this visit.    PAST MEDICAL HISTORY: Past Medical History:  Diagnosis Date  . Bipolar depression ([place]) 09/2015  . Dizziness   . Elevated blood pressure, situational     . GERD (gastroesophageal reflux disease)   . History of patellar fracture 2016  . History of tibial fracture 2016  . Hyperlipidemia   . Night terrors   . Night terrors, adult 09/2015    PAST SURGICAL HISTORY: Past Surgical History:  Procedure Laterality Date  . CHOLECYSTECTOMY    . JOINT REPLACEMENT      FAMILY HISTORY: Family History  Problem Relation Age of Onset  . Hyperlipidemia Mother   . Hypertension Mother   . Arthritis/Rheumatoid Father   . Hyperlipidemia Maternal Aunt   . Hypertension Maternal Aunt   . Cancer Maternal Aunt     SOCIAL HISTORY: Social History   Socioeconomic History  . Marital status: Divorced    Spouse name: Not on file  . Number of children: 2  . Years of education: 2 years of college  . Highest education level: Not on file  Occupational History  . Not on file  Tobacco Use  . Smoking status: Former Smoker    Packs/day: 0.25    Years: 4.00    Pack years: 1.00    Quit date: 12/21/2017    Years since quitting: 2.0  . Smokeless tobacco: Never Used  Substance and Sexual Activity  . Alcohol use: Yes    Comment: special occasions  . Drug use: No  . Sexual activity: Yes    Birth control/protection: None    Comment: Celibate  Other Topics Concern  . Not on file  Social History Narrative   Lives at home with her fiance.   Right-handed.   No daily use of caffeine.   Social Determinants of Health   Financial Resource Strain:   . Difficulty of Paying Living Expenses:   Food Insecurity:   . Worried About Charity fundraiser in the Last Year:   . Arboriculturist in the Last Year:   Transportation Needs:   . Film/video editor (Medical):   Marland Kitchen Lack of Transportation (Non-Medical):   Physical Activity:   . Days of Exercise per Week:   . Minutes of Exercise per Session:   Stress:   . Feeling of Stress :   Social Connections:   . Frequency of Communication with Friends and Family:   . Frequency of Social Gatherings with Friends and  Family:   . Attends Religious Services:   . Active Member of Clubs or Organizations:   . Attends Archivist Meetings:   Marland Kitchen Marital Status:   Intimate Partner Violence:   . Fear of Current or Ex-Partner:   . Emotionally Abused:   Marland Kitchen Physically Abused:   . Sexually Abused:      PHYSICAL EXAM   Vitals:   01/24/20 1032  BP: 124/72  Pulse: 93  Weight: 186 lb 8 oz (84.6 kg)  Height: 5\' 3"  (1.6 m)    Not recorded      Body mass index is 33.04 kg/m.  PHYSICAL EXAMNIATION:  Gen: NAD, conversant, well nourised, well groomed  Cardiovascular: Regular rate rhythm, no peripheral edema, warm, nontender. Eyes: Conjunctivae clear without exudates or hemorrhage Neck: Supple, no carotid bruits. Pulmonary: Clear to auscultation bilaterally   NEUROLOGICAL EXAM:  MENTAL STATUS: Speech:    Speech is normal; fluent and spontaneous with normal comprehension.  Cognition:     Orientation to time, place and person     Normal recent and remote memory     Normal Attention span and concentration     Normal Language, naming, repeating,spontaneous speech     Fund of knowledge   CRANIAL NERVES: CN II: Visual fields are full to confrontation. Pupils are round equal and briskly reactive to light. CN III, IV, VI: extraocular movement are normal. No ptosis. CN V: Facial sensation is intact to light touch CN VII: Face is symmetric with normal eye closure, tenderness of left temporal region CN VIII: Hearing is normal to causal conversation. CN IX, X: Phonation is normal. CN XI: Head turning and shoulder shrug are intact  MOTOR: There is no pronator drift of out-stretched arms. Muscle bulk and tone are normal. Muscle strength is normal.  REFLEXES: Reflexes are 2+ and symmetric at the biceps, triceps, knees, and ankles. Plantar responses are flexor.  SENSORY: Intact to light touch, pinprick and vibratory sensation are intact in fingers and  toes.  COORDINATION: There is no trunk or limb dysmetria noted.  GAIT/STANCE: Posture is normal. Gait is steady with normal steps, base, arm swing, and turning. Heel and toe walking are normal. Tandem gait is normal.  Romberg is absent.   DIAGNOSTIC DATA (LABS, IMAGING, TESTING) - I reviewed patient records, labs, notes, testing and imaging myself where available.   ASSESSMENT AND PLAN  Midori Slacum is a 59 y.o. female   New onset headaches since she fell, bumped her left temporal frontal region on hard metal, with transient loss of consciousness  Essentially normal neurological examination, other than the tenderness at the left frontal temporal region,  The headache has a lot of migraine features,  Start nortriptyline, titrating to 20 mg every night  Imitrex as needed, may mix with Zofran, Aleve,  Also suggested hot compression to left frontotemporal region  Abnormal CT head  In April 2021, CT head showed 1 x 1 cm round structure at the right CP angle, artifact versus, frontal vascular structure, she does reported long history of worsening tinnitus, right worse than left,  MRI of the brain with without contrast   Marcial Pacas, M.D. Ph.D.  [facility] [address] [place], [place] [address] Ph: [phone] Fax: [phone]  CC: Marty Heck, DO

## 2020-01-25 ENCOUNTER — Other Ambulatory Visit: Payer: Self-pay | Admitting: Internal Medicine

## 2020-01-25 ENCOUNTER — Other Ambulatory Visit: Payer: Self-pay | Admitting: Neurology

## 2020-01-25 DIAGNOSIS — K219 Gastro-esophageal reflux disease without esophagitis: Secondary | ICD-10-CM

## 2020-01-25 MED FILL — metFORMIN HCL 500 MG TABS: 500 | 30 days supply | Qty: 60 | Fill #9

## 2020-01-25 MED FILL — CHLORHEXIDINE 0.12% RINSE: 0.12 | 16 days supply | Qty: 473 | Fill #0

## 2020-01-25 MED FILL — oxyCODONE HCL 5 MG TABS: 5 | 1 days supply | Qty: 3 | Fill #0

## 2020-01-25 MED FILL — AMOXICILLIN 500 MG CAPSULE: 500 | 7 days supply | Qty: 21 | Fill #0

## 2020-01-25 MED FILL — lamoTRIgine 100 MG TABS: 100 | 30 days supply | Qty: 30 | Fill #3

## 2020-01-26 ENCOUNTER — Other Ambulatory Visit: Payer: Self-pay | Admitting: Internal Medicine

## 2020-01-26 MED FILL — PANTOPRAZOLE SOD DR 40 MG T: 40 | 30 days supply | Qty: 30 | Fill #0

## 2020-01-27 ENCOUNTER — Encounter: Payer: Self-pay | Admitting: Dietician

## 2020-01-27 ENCOUNTER — Ambulatory Visit: Payer: Medicaid Other | Admitting: Dietician

## 2020-01-27 DIAGNOSIS — E119 Type 2 diabetes mellitus without complications: Secondary | ICD-10-CM | POA: Diagnosis not present

## 2020-01-27 MED FILL — oxyCODONE HCL 5 MG TABS: 5 | 2 days supply | Qty: 6 | Fill #0

## 2020-01-27 NOTE — Patient Instructions (Signed)
You are doing fantastic! Keep watching your calories to maintain a steady weight loss, about 1 pound per week is awesome, slow weight loss is sustainable!  We will give you a call soon to schedule an appointment the week of June 21-25th.   [name] [phone]

## 2020-01-27 NOTE — Progress Notes (Signed)
Diabetes Self-Management Education  Visit Type: 6 Month Follow-Up  Appt. Start Time: 0950 Appt. End Time: 1025  01/27/2020  [name], identified by name and date of birth, is a 59 y.o. female with a diagnosis of Diabetes:  .Type 2  ASSESSMENT  [name] voices feeling of not being heard when she was reaching out for help with her depressive symptoms and at a recent doctor appointment.  Her weight has increased due to the covid-19 pandemic  Weight 189 lb (85.7 kg). Body mass index is 33.48 kg/m.  Lab Results  Component Value Date   HGBA1C 6.0 (A) 11/23/2019   HGBA1C 6.1 (A) 05/04/2019   HGBA1C 6.7 (A) 02/15/2019   HGBA1C 6.5 (H) 04/02/2018   Estimated body mass index is 33.48 kg/m as calculated from the following:   Height as of 01/24/20: 5\' 3"  (1.6 m).   Weight as of this encounter: 189 lb (85.7 kg). Wt Readings from Last 10 Encounters:  01/27/20 189 lb (85.7 kg)  01/24/20 186 lb 8 oz (84.6 kg)  12/12/19 182 lb 8 oz (82.8 kg)  11/23/19 180 lb 8 oz (81.9 kg)  06/17/19 183 lb (83 kg)  05/30/19 180 lb 11.2 oz (82 kg)  05/24/19 176 lb (79.8 kg)  05/22/19 176 lb (79.8 kg)  05/04/19 179 lb 14.4 oz (81.6 kg)  03/29/19 179 lb (81.2 kg)    Diabetes Self-Management Education - 01/27/20 1300      Visit Information   Visit Type  6 Month Follow-Up      Psychosocial Assessment   Patient Belief/Attitude about Diabetes  Motivated to manage diabetes    Self-care barriers  Other (comment)   reports significant depressive episode;sees counselor   Self-management support  CDE visits      Complications   Last HgB A1C per patient/outside source  6 %    How often do you check your blood sugar?  1-2 times/day    Fasting Blood glucose range (mg/dL)  70-129    Number of hypoglycemic episodes per month  0    Number of hyperglycemic episodes per week  0    Are you checking your feet?  Yes      Subsequent Visit   Since your last visit have you continued or begun to take your  medications as prescribed?  Yes    Since your last visit have you had your blood pressure checked?  Yes    Is your most recent blood pressure lower, unchanged, or higher since your last visit?  Lower    Since your last visit have you experienced any weight changes?  Gain    Weight Gain (lbs)  13    Since your last visit, are you checking your blood glucose at least once a day?  No       Individualized Plan for Diabetes Self-Management Training:   Learning Objective:  Patient will have a greater understanding of diabetes self-management. Patient education plan is to attend individual and/or group sessions per assessed needs and concerns.   Plan:   Patient Instructions  You are doing fantastic! Keep watching your calories to maintain a steady weight loss, about 1 pound per week is awesome, slow weight loss is sustainable!  We will give you a call soon to schedule an appointment the week of June 21-25th.   [name] [phone]   Expected Outcomes:     Education material provided: Diabetes Resources  If problems or questions, patient to contact team via:  Phone  Future DSME appointment: 4 weeks for monitoring Debera Lat, RD 01/27/2020 2:03 PM.

## 2020-01-28 ENCOUNTER — Ambulatory Visit
Admission: RE | Admit: 2020-01-28 | Discharge: 2020-01-28 | Disposition: A | Discharge status: Home / Self Care | Payer: Medicaid Other | Source: Ambulatory Visit | Attending: Neurology | Admitting: Neurology

## 2020-01-28 ENCOUNTER — Other Ambulatory Visit: Payer: Self-pay

## 2020-01-28 DIAGNOSIS — G44301 Post-traumatic headache, unspecified, intractable: Secondary | ICD-10-CM

## 2020-01-28 MED ORDER — GADOBENATE DIMEGLUMINE 529 MG/ML IV SOLN
17.0000 mL | Freq: Once | INTRAVENOUS | Status: AC | PRN
  Administered 2020-01-28: 17 mL via INTRAVENOUS

## 2020-01-31 ENCOUNTER — Telehealth: Payer: Self-pay | Admitting: Neurology

## 2020-01-31 NOTE — Telephone Encounter (Signed)
Left message requesting a return call.

## 2020-01-31 NOTE — Telephone Encounter (Signed)
I have spoken to the patient. She verbalized understanding of her MRI brain results.

## 2020-01-31 NOTE — Telephone Encounter (Signed)
Medicaid Josem KaufmannYK:[id number] (exp. 07/23/20) patient at MRI at [facility] on 01/28/20.

## 2020-01-31 NOTE — Telephone Encounter (Signed)
  IMPRESSION: This MRI of the brain with and without contrast shows the following: 1.   There are a few scattered small round T2/FLAIR hyperintense foci in the subcortical white matter consistent with minimal chronic microvascular ischemic change, typical for age. 2.   The right cerebellar pontine angle region appears normal.  The finding on CT scan was likely artifact. 3.   Right maxillary chronic sinusitis. 4.   There is a normal enhancement pattern and no acute findings.  Please call patient there was no significant abnormalities on MRI of the brain

## 2020-02-20 ENCOUNTER — Telehealth: Payer: Self-pay | Admitting: Dietician

## 2020-02-20 NOTE — Telephone Encounter (Signed)
Left a message that she could be scheduled for a follow up on 03/02/20

## 2020-03-01 MED FILL — ATORVASTATIN 80 MG TABLET: 80 | 30 days supply | Qty: 30 | Fill #4

## 2020-03-01 MED FILL — PANTOPRAZOLE SOD DR 40 MG T: 40 | 30 days supply | Qty: 30 | Fill #1

## 2020-03-08 ENCOUNTER — Other Ambulatory Visit: Payer: Self-pay | Admitting: Internal Medicine

## 2020-03-08 MED FILL — METFORMIN HCL 500 MG TABS: 500 | 30 days supply | Qty: 60 | Fill #0

## 2020-03-08 MED FILL — lamoTRIgine 100 MG TABS: 100 | 30 days supply | Qty: 30 | Fill #0

## 2020-03-22 ENCOUNTER — Encounter: Payer: Self-pay | Admitting: Orthopedic Surgery

## 2020-03-22 ENCOUNTER — Ambulatory Visit (INDEPENDENT_AMBULATORY_CARE_PROVIDER_SITE_OTHER): Payer: Medicaid Other

## 2020-03-22 ENCOUNTER — Ambulatory Visit (INDEPENDENT_AMBULATORY_CARE_PROVIDER_SITE_OTHER): Payer: Medicaid Other | Admitting: Physician Assistant

## 2020-03-22 ENCOUNTER — Other Ambulatory Visit: Payer: Self-pay

## 2020-03-22 VITALS — Ht 63.0 in | Wt 189.0 lb

## 2020-03-22 DIAGNOSIS — M25561 Pain in right knee: Secondary | ICD-10-CM

## 2020-03-22 MED ORDER — METHYLPREDNISOLONE ACETATE 40 MG/ML IJ SUSP
40.0000 mg | INTRAMUSCULAR | Status: AC | PRN
  Administered 2020-03-22: 40 mg via INTRA_ARTICULAR

## 2020-03-22 MED ORDER — LIDOCAINE HCL 1 % IJ SOLN
5.0000 mL | INTRAMUSCULAR | Status: AC | PRN
  Administered 2020-03-22: 5 mL

## 2020-03-22 MED ORDER — ACETAMINOPHEN-CODEINE #3 300-30 MG PO TABS: 1.0000 | ORAL_TABLET | ORAL | 0 refills | Status: DC | PRN

## 2020-03-22 MED FILL — ACETAMINOPHEN-COD #3 TABLET: 300-30 | 5 days supply | Qty: 30 | Fill #0

## 2020-03-22 NOTE — Progress Notes (Signed)
Office Visit Note   Patient: Meghan Wilson           Date of Birth: October 22, 1960           MRN: [id number] Visit Date: 03/22/2020              Requested by: Molli Hazard A, DO [address],  [place] [address] PCP: Marty Heck, DO  Chief Complaint  Patient presents with  . Right Knee - Pain    Hx fixation proximal tib       HPI: This is a pleasant 59 year old woman with a history of a ORIF of a right tibial plateau fracture several years ago.  She has since had increasing knee pain and periodically gets a steroid injection which seems to help her.  She has had increasing right knee pain and is having difficulty sleeping at night she is requesting a cortisone injection  Assessment & Plan: Visit Diagnoses:  1. 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  We will go forward with an injection today and she will follow up in 3 weeks  Follow-Up Instructions: No follow-ups on file.   Ortho Exam  Patient is alert, oriented, no adenopathy, well-dressed, normal affect, normal respiratory effort. Focused examination of her right knee she is tender on the medial lateral joint line she has no effusion she has no cellulitis range of motion is fairly painful for her  Imaging: No results found. No images are attached to the encounter.  Labs: Lab Results  Component Value Date   HGBA1C 6.0 (A) 11/23/2019   HGBA1C 6.1 (A) 05/04/2019   HGBA1C 6.7 (A) 02/15/2019   ESRSEDRATE 31 05/04/2019   CRP 2 05/04/2019   REPTSTATUS 05/23/2019 FINAL 05/22/2019   CULT MULTIPLE SPECIES PRESENT, SUGGEST RECOLLECTION (A) 05/22/2019     Lab Results  Component Value Date   ALBUMIN 4.2 05/22/2019   ALBUMIN 4.5 01/01/2018    No results found for: MG No results found for: VD25OH  No results found for: PREALBUMIN CBC EXTENDED Latest Ref Rng & Units 12/05/2019 05/22/2019 05/04/2019  WBC 4.0 - 10.5 K/uL 8.2 8.8 10.7  RBC 3.87 - 5.11  MIL/uL 4.85 4.75 4.48  HGB 12.0 - 15.0 g/dL 13.2 12.7 12.5  HCT 36 - 46 % 42.0 41.5 39.8  PLT 150 - 400 K/uL 292 334 307  NEUTROABS 1.7 - 7.7 K/uL 3.9 5.2 -  LYMPHSABS 0.7 - 4.0 K/uL 3.1 2.6 -     Body mass index is 33.48 kg/m.  Orders:  Orders Placed This Encounter  Procedures  . XR Knee 1-2 Views Right   Meds ordered this encounter  Medications  . acetaminophen-codeine (TYLENOL #3) 300-30 MG tablet    Sig: Take 1 tablet by mouth every 4 (four) hours as needed for moderate pain.    Dispense:  30 tablet    Refill:  0     Procedures: Large Joint Inj on 03/22/2020 4:47 PM Indications: pain and diagnostic evaluation Details: 22 G 1.5 in needle, anteromedial approach  Arthrogram: No  Medications: 40 mg methylPREDNISolone acetate 40 MG/ML; 5 mL lidocaine 1 % Outcome: tolerated well, no immediate complications Procedure, treatment alternatives, risks and benefits explained, specific risks discussed. Consent was given by the patient.      Clinical Data: No additional findings.  ROS:  All other systems negative, except as noted in the HPI. Review of Systems  Objective: Vital Signs:  Ht 5\' 3"  (1.6 m)   Wt 189 lb (85.7 kg)   BMI 33.48 kg/m   Specialty Comments:  No specialty comments available.  PMFS History: Patient Active Problem List   Diagnosis Date Noted  . Post concussion syndrome 12/20/2019  . Head injury 12/13/2019  . Episodic tension-type headache, not intractable 05/04/2019  . Melena 05/04/2019  . Type II diabetes mellitus ([place]) 02/16/2019  . Elevated blood pressure, situational 05/06/2018  . Pain of right knee after injury 03/10/2018  . Atopic dermatitis 02/15/2018  . 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 Encounter for smoking cessation counseling 01/02/2018  . Hyperlipidemia 01/01/2018  . Peptic gastritis 01/01/2018  . Morbid obesity ([place]) 08/03/2015  . Bipolar disorder ([place]) 08/03/2015   Past Medical History:  Diagnosis Date   . Bipolar depression ([place]) 09/2015  . Dizziness   . Elevated blood pressure, situational   . GERD (gastroesophageal reflux disease)   . History of patellar fracture 2016  . History of tibial fracture 2016  . Hyperlipidemia   . Night terrors   . Night terrors, adult 09/2015    Family History  Problem Relation Age of Onset  . Hyperlipidemia Mother   . Hypertension Mother   . Arthritis/Rheumatoid Father   . Hyperlipidemia Maternal Aunt   . Hypertension Maternal Aunt   . Cancer Maternal Aunt     Past Surgical History:  Procedure Laterality Date  . CHOLECYSTECTOMY    . JOINT REPLACEMENT     Social History   Occupational History  . Not on file  Tobacco Use  . Smoking status: Former Smoker    Packs/day: 0.25    Years: 4.00    Pack years: 1.00    Quit date: 12/21/2017    Years since quitting: 2.2  . Smokeless tobacco: Never Used  Vaping Use  . Vaping Use: Never used  Substance and Sexual Activity  . Alcohol use: Yes    Comment: special occasions  . Drug use: No  . Sexual activity: Yes    Birth control/protection: None    Comment: Celibate

## 2020-03-23 ENCOUNTER — Telehealth: Payer: Self-pay | Admitting: Dietician

## 2020-03-23 NOTE — Telephone Encounter (Signed)
Patient called requesting a diabetes follow up visit.scheduled for Tuesday at 915 am

## 2020-03-26 ENCOUNTER — Ambulatory Visit: Payer: Medicaid Other | Admitting: Neurology

## 2020-03-26 ENCOUNTER — Encounter: Payer: Self-pay | Admitting: Neurology

## 2020-03-26 ENCOUNTER — Other Ambulatory Visit: Payer: Self-pay

## 2020-03-26 VITALS — BP 125/80 | HR 90 | Ht 63.0 in | Wt 185.0 lb

## 2020-03-26 DIAGNOSIS — G44219 Episodic tension-type headache, not intractable: Secondary | ICD-10-CM | POA: Diagnosis not present

## 2020-03-26 DIAGNOSIS — S0990XD Unspecified injury of head, subsequent encounter: Secondary | ICD-10-CM

## 2020-03-26 NOTE — Progress Notes (Signed)
PATIENT: Meghan Wilson DOB: [birth year]  REASON FOR VISIT: follow up HISTORY FROM: patient  HISTORY OF PRESENT ILLNESS: Today 03/26/20  HISTORY Meghan Wilson is a 59 year old female, seen in request by her primary care physician Dr. Sharon Seller, Andris Baumann for evaluation of dizziness, frequent left-sided headaches, initial evaluation was on Jan 24, 2020.  I have reviewed and summarized the referring note from the referring physician.  She had a past medical history of bipolar disorder, is taking lamotrigine 100 mg every night, also gabapentin 300 mg 3 times a day, also had a past medical history of hyperlipidemia, diabetes,  She tripped and fell forward bumped her left temporoparietal region to her metal heater in March 2021, she might have transient loss of consciousness, she had lost some recollection of the event following her fall, since the injury, she began to notice almost daily left frontotemporal region tension, pressure, sometimes involving into more significant pain, with associated light, noise sensitivity, movement make it worse, mild nausea sometimes  She presented to emergency room on December 05, 2019 due to left-sided headaches, dizziness, unsteadiness sensation  She also complains of long history of gradual onset bilateral tinnitus, right worse than left, she denies significant gait abnormality, denies hearing loss  I personally reviewed CT head without contrast, no acute abnormality, mild abnormal signal at right CP angle, rounded structure, artifact versus confluence of vascular structures  Laboratory evaluation in April 2021, normal CBC, BMP showed elevated glucose 110, creatinine of 0.53, A1c 6.0,  Update March 26, 2020 SS: MRI of the brain in May 2021 showed no significant abnormality, specifically, the right cerebellar pontine angle region appeared normal.  No longer complains of headache, dizziness, or nausea.  She has been off nortriptyline for at least a month.  Not  taking the Zofran or Imitrex either.  No complaints today, her symptoms are gone.  Presents today for evaluation unaccompanied  REVIEW OF SYSTEMS: Out of a complete 14 system review of symptoms, the patient complains only of the following symptoms, and all other reviewed systems are negative.  N/a  ALLERGIES: Allergies  Allergen Reactions   Depakote [Divalproex Sodium] Other (See Comments)    Hair fell out   Ibuprofen Nausea And Vomiting   Latex Rash    HOME MEDICATIONS: Outpatient Medications Prior to Visit  Medication Sig Dispense Refill   Accu-Chek FastClix Lancets MISC Check blood sugar up to 7 times a week 102 each 3   acetaminophen-codeine (TYLENOL #3) 300-30 MG tablet Take 1 tablet by mouth every 4 (four) hours as needed for moderate pain. 30 tablet 0   atorvastatin (LIPITOR) 80 MG tablet TAKE 1 TABLET (80 MG TOTAL) BY MOUTH DAILY. 30 tablet 11   fluticasone (FLONASE) 50 MCG/ACT nasal spray Place 1 spray into both nostrils daily as needed for allergies or rhinitis.     glucose blood (ACCU-CHEK GUIDE) test strip Check blood sugar up to 7 times per week 100 each 3   lamoTRIgine (LAMICTAL) 100 MG tablet Take 100 mg by mouth daily.   4   metFORMIN (GLUCOPHAGE) 500 MG tablet TAKE 1 TABLET BY MOUTH DAILY FOR FIRST WEEK, THEN 1 TABLET 2 TIMES DAILY THEREAFTER. 60 tablet 11   pantoprazole (PROTONIX) 40 MG tablet TAKE 1 TABLET (40 MG TOTAL) BY MOUTH DAILY. 30 tablet 5   senna-docusate (SENOKOT-S) 8.6-50 MG tablet Take 1-2 tablets by mouth 2 (two) times daily as needed for mild constipation or moderate constipation. 30 tablet 0   meclizine (ANTIVERT) 12.5 MG tablet  Take 1 tablet (12.5 mg total) by mouth 3 (three) times daily as needed for dizziness. 30 tablet 0   nortriptyline (PAMELOR) 10 MG capsule Take 2 capsules (20 mg total) by mouth at bedtime. 60 capsule 11   ondansetron (ZOFRAN ODT) 4 MG disintegrating tablet Take 1 tablet (4 mg total) by mouth every 8 (eight) hours  as needed. 20 tablet 6   SUMAtriptan (IMITREX) 50 MG tablet Take 1 tablet (50 mg total) by mouth every 2 (two) hours as needed for migraine. May repeat in 2 hours if headache persists or recurs. 10 tablet 6   gabapentin (NEURONTIN) 300 MG capsule Take 1 capsule (300 mg total) by mouth 3 (three) times daily. 90 capsule 0   No facility-administered medications prior to visit.    PAST MEDICAL HISTORY: Past Medical History:  Diagnosis Date   Bipolar depression ([place]) 09/2015   Dizziness    Elevated blood pressure, situational    GERD (gastroesophageal reflux disease)    History of patellar fracture 2016   History of tibial fracture 2016   Hyperlipidemia    Night terrors    Night terrors, adult 09/2015    PAST SURGICAL HISTORY: Past Surgical History:  Procedure Laterality Date   CHOLECYSTECTOMY     JOINT REPLACEMENT      FAMILY HISTORY: Family History  Problem Relation Age of Onset   Hyperlipidemia Mother    Hypertension Mother    Arthritis/Rheumatoid Father    Hyperlipidemia Maternal Aunt    Hypertension Maternal Aunt    Cancer Maternal Aunt     SOCIAL HISTORY: Social History   Socioeconomic History   Marital status: Divorced    Spouse name: Not on file   Number of children: 2   Years of education: 2 years of college   Highest education level: Not on file  Occupational History   Not on file  Tobacco Use   Smoking status: Former Smoker    Packs/day: 0.25    Years: 4.00    Pack years: 1.00    Quit date: 12/21/2017    Years since quitting: 2.2   Smokeless tobacco: Never Used  Vaping Use   Vaping Use: Never used  Substance and Sexual Activity   Alcohol use: Yes    Comment: special occasions   Drug use: No   Sexual activity: Yes    Birth control/protection: None    Comment: Celibate  Other Topics Concern   Not on file  Social History Narrative   Lives at home with her fiance.   Right-handed.   No daily use of caffeine.    Social Determinants of Health   Financial Resource Strain:    Difficulty of Paying Living Expenses:   Food Insecurity:    Worried About Charity fundraiser in the Last Year:    Arboriculturist in the Last Year:   Transportation Needs:    Film/video editor (Medical):    Lack of Transportation (Non-Medical):   Physical Activity:    Days of Exercise per Week:    Minutes of Exercise per Session:   Stress:    Feeling of Stress :   Social Connections:    Frequency of Communication with Friends and Family:    Frequency of Social Gatherings with Friends and Family:    Attends Religious Services:    Active Member of Clubs or Organizations:    Attends Archivist Meetings:    Marital Status:   Intimate Partner Violence:  Fear of Current or Ex-Partner:    Emotionally Abused:    Physically Abused:    Sexually Abused:     PHYSICAL EXAM  Vitals:   03/26/20 0950  BP: 125/80  Pulse: 90  Weight: 185 lb (83.9 kg)  Height: 5\' 3"  (1.6 m)   Body mass index is 32.77 kg/m.  Generalized: Well developed, in no acute distress   Neurological examination  Mentation: Alert oriented to time, place, history taking. Follows all commands speech and language fluent Cranial nerve II-XII: Pupils were equal round reactive to light. Extraocular movements were full, visual field were full on confrontational test. Facial sensation and strength were normal. Head turning and shoulder shrug were normal and symmetric. Motor: The motor testing reveals 5 over 5 strength of all 4 extremities. Good symmetric motor tone is noted throughout.  Sensory: Sensory testing is intact to soft touch on all 4 extremities. No evidence of extinction is noted.  Coordination: Cerebellar testing reveals good finger-nose-finger and heel-to-shin bilaterally.  Gait and station: Gait is normal. Tandem gait is normal.  Reflexes: Deep tendon reflexes are symmetric and normal bilaterally.    DIAGNOSTIC DATA (LABS, IMAGING, TESTING) - I reviewed patient records, labs, notes, testing and imaging myself where available.  Lab Results  Component Value Date   WBC 8.2 12/05/2019   HGB 13.2 12/05/2019   HCT 42.0 12/05/2019   MCV 86.6 12/05/2019   PLT 292 12/05/2019      Component Value Date/Time   NA 141 12/05/2019 1739   NA 141 11/23/2019 1637   K 3.6 12/05/2019 1739   CL 104 12/05/2019 1739   CO2 29 12/05/2019 1739   GLUCOSE 110 (H) 12/05/2019 1739   BUN 15 12/05/2019 1739   BUN 14 11/23/2019 1637   CREATININE 0.53 12/05/2019 1739   CALCIUM 9.4 12/05/2019 1739   PROT 7.6 05/22/2019 0928   PROT 6.7 01/01/2018 1050   ALBUMIN 4.2 05/22/2019 0928   ALBUMIN 4.5 01/01/2018 1050   AST 20 05/22/2019 0928   ALT 20 05/22/2019 0928   ALKPHOS 62 05/22/2019 0928   BILITOT 0.2 (L) 05/22/2019 0928   BILITOT <0.2 01/01/2018 1050   GFRNONAA >60 12/05/2019 1739   GFRAA >60 12/05/2019 1739   Lab Results  Component Value Date   CHOL 171 04/02/2018   HDL 59 04/02/2018   LDLCALC 61 04/02/2018   TRIG 254 (H) 04/02/2018   CHOLHDL 2.9 04/02/2018   Lab Results  Component Value Date   HGBA1C 6.0 (A) 11/23/2019   No results found for: VITAMINB12 No results found for: TSH  ASSESSMENT AND PLAN 59 y.o. year old female  has a past medical history of Bipolar depression ([place]) (09/2015), Dizziness, Elevated blood pressure, situational, GERD (gastroesophageal reflux disease), History of patellar fracture (2016), History of tibial fracture (2016), Hyperlipidemia, Night terrors, and Night terrors, adult (09/2015). here with:  1.  New onset headache since she fell, bumped left temporal frontal region on hard metal with transient loss of consciousness -Headaches with migraine features -Started nortriptyline, has been off for at least a month, no longer complains of dizziness, headache, or nausea-symptoms have resolved -Will remain off medications, possible headaches may return if are  migrainous, could restart medications if needed -Continue follow-up with PCP, follow-up in our office on as-needed basis  2.  Abnormal CT head -April 2021, CT head showed 1 x 1 cm round structure at right CP angle artifact versus frontal vascular structure -MRI of the brain with and without contrast was normal  in May 2021  I spent 20 minutes of face-to-face and non-face-to-face time with patient.  This included previsit chart review, lab review, study review, order entry, electronic health record documentation, patient education.  Butler Denmark, AGNP-C, DNP 03/26/2020, 10:09 AM [facility] [address], [place], [place] [address] [phone]

## 2020-03-26 NOTE — Patient Instructions (Addendum)
Continue follow up with primary doctor  See you as needed

## 2020-03-27 ENCOUNTER — Encounter: Payer: Self-pay | Admitting: Dietician

## 2020-03-27 ENCOUNTER — Ambulatory Visit: Payer: Medicaid Other | Admitting: Dietician

## 2020-03-27 VITALS — Wt 184.0 lb

## 2020-03-27 DIAGNOSIS — Z6832 Body mass index (BMI) 32.0-32.9, adult: Secondary | ICD-10-CM

## 2020-03-27 DIAGNOSIS — Z713 Dietary counseling and surveillance: Secondary | ICD-10-CM

## 2020-03-27 DIAGNOSIS — E119 Type 2 diabetes mellitus without complications: Secondary | ICD-10-CM | POA: Diagnosis not present

## 2020-03-27 NOTE — Patient Instructions (Addendum)
Meghan Wilson-   Thank you for your visit today.   You are doing a wonderful job taking care you!  Your blood sugars, dietary recall and blood pressures mostly  look great  Here are some things we talked about today  I recommend a general store brand multivitamin daily with vitamin B12 in it.  I will ask your doctor for a podiatry( foot doctor) referral for your painful bunion and ingrown toenails   We talked about a microalbumin today- this is a measure of your KIDNEY health. It  measures how much protein your kidneys are letting go of. They are not supposed to let go of very much if any.   Keeping your blood pressure under 130/80 is best.   Your chart shows that a pneumonia vaccine is due- you can discuss that with your doctor.   We can follow up in 6 months(January 2022) or sooner if needed.   [name] [phone]

## 2020-03-27 NOTE — Addendum Note (Signed)
Addended by: Resa Miner on: 03/27/2020 11:55 AM   Modules accepted: Orders

## 2020-03-27 NOTE — Progress Notes (Signed)
Diabetes Self-Management Education  Visit Type: Follow-up  Appt. Start Time: 0910 Appt. End Time: 4431  03/27/2020  [name], identified by name and date of birth, is a 59 y.o. female with a diagnosis of Diabetes:  Marland Kitchen Type 2 diabetes  ASSESSMENT  Weight 184 lb (83.5 kg). Body mass index is 32.59 kg/m.   Wt Readings from Last 5 Encounters:  03/27/20 184 lb (83.5 kg)  03/26/20 185 lb (83.9 kg)  03/22/20 189 lb (85.7 kg)  01/27/20 189 lb (85.7 kg)  01/24/20 186 lb 8 oz (84.6 kg)   BP Readings from Last 3 Encounters:  03/26/20 125/80  01/24/20 124/72  12/12/19 119/88   Lab Results  Component Value Date   HGBA1C 6.0 (A) 11/23/2019   HGBA1C 6.1 (A) 05/04/2019   HGBA1C 6.7 (A) 02/15/2019   HGBA1C 6.5 (H) 04/02/2018    Urine micral: okay with doing it today, but decided to defer it until her next visit Covid-19 vaccination- done and recorded Blood sugar: 540 today after 1 slice bacon for breakfast Blood pressure: 143/86 Bowel habits:no problem Dietary recall:2-3 fruits,1-2 veggies, nuts, water Physical activity:walks most days Depressive symptoms: improved Ingrown toenails and bunion - ask for a referral   Diabetes Self-Management Education - 03/27/20 1000      Visit Information   Visit Type Follow-up      Pre-Education Assessment   Patient understands prevention, detection, and treatment of chronic complications. Needs Review    Patient understands how to develop strategies to promote health/change behavior. Needs Review      Complications   How often do you check your blood sugar? 3-4 times / week    Fasting Blood glucose range (mg/dL) 70-129    Postprandial Blood glucose range (mg/dL) 70-129;130-179    Number of hypoglycemic episodes per month 0    Number of hyperglycemic episodes per week 0    Have you had a dilated eye exam in the past 12 months? Yes      Dietary Intake   Breakfast egg, bacon fruits water    Lunch nuts and fruit    Snack (afternoon)  no sugar added popsicle    Dinner hamburger, asparagus. water    Snack (evening) no sugar added popsicle    Beverage(s) water      Exercise   Exercise Type ADL's;Light (walking / raking leaves)      Patient Education   Previous Diabetes Education Yes (please comment)   here   Monitoring Interpreting lab values - A1C, lipid, urine microalbumina.    Chronic complications Assessed and discussed foot care and prevention of foot problems;Nephropathy, what it is, prevention of, the use of ACE, ARB's and early detection of through urine microalbumia.;Applicable immunizations    Personal strategies to promote health Lifestyle issues that need to be addressed for better diabetes care      Individualized Goals (developed by patient)   Nutrition Follow meal plan discussed    Physical Activity 30 minutes per day      Patient Self-Evaluation of Goals - Patient rates self as meeting previously set goals (% of time)   Monitoring >75%      Outcomes   Expected Outcomes Demonstrated interest in learning. Expect positive outcomes    Future DMSE 6 months    Program Status Completed      Subsequent Visit   Since your last visit have you continued or begun to take your medications as prescribed? Yes    Since your last visit have  you had your blood pressure checked? Yes    Is your most recent blood pressure lower, unchanged, or higher since your last visit? Unchanged    Since your last visit have you experienced any weight changes? Loss    Weight Loss (lbs) 1    Since your last visit, are you checking your blood glucose at least once a day? No   she does not need to, suggested spot checking periodically          Individualized Plan for Diabetes Self-Management Training:   Learning Objective:  Patient will have a greater understanding of diabetes self-management. Patient education plan is to attend individual and/or group sessions per assessed needs and concerns.    Call to follow up in 1 month  to reassess as patient would still like support for weight loss.  Plan:   Patient Instructions  [name]-   Thank you for your visit today.   You are doing a wonderful job taking care you!  Your blood sugars, dietary recall and blood pressures mostly  look great  Here are some things we talked about today  I recommend a general store brand multivitamin daily with vitamin B12 in it.  I will ask your doctor for a podiatry( foot doctor) referral for your painful bunion and ingrown toenails   We talked about a microalbumin today- this is a measure of your KIDNEY health. It  measures how much protein your kidneys are letting go of. They are not supposed to let go of very much if any.   Keeping your blood pressure under 130/80 is best.   Your chart shows that a pneumonia vaccine is due- you can discuss that with your doctor.   We can follow up in 6 months(January 2022) or sooner if needed.   Meghan Wilson [phone]    Expected Outcomes:  Demonstrated interest in learning. Expect positive outcomes  Education material provided: Diabetes Resources  If problems or questions, patient to contact team via:  Phone  Future DSME appointment: 6 months Debera Lat, RD 03/27/2020 10:29 AM.

## 2020-03-28 ENCOUNTER — Telehealth: Payer: Self-pay

## 2020-03-28 NOTE — Telephone Encounter (Signed)
A bit too soon but she can come in for re evaluation

## 2020-03-28 NOTE — Telephone Encounter (Signed)
Pt needed a early morning appt. Sch with erin in Friday.

## 2020-03-28 NOTE — Telephone Encounter (Signed)
Patient called wanting to know if she is able to get another shot for her right knee?  Cb# is [phone].  Please advise.  Thank you.

## 2020-03-28 NOTE — Telephone Encounter (Signed)
This pt just received an injection on 03/22/20 please see below and advise.

## 2020-03-30 ENCOUNTER — Ambulatory Visit (INDEPENDENT_AMBULATORY_CARE_PROVIDER_SITE_OTHER): Payer: Medicaid Other

## 2020-03-30 ENCOUNTER — Ambulatory Visit (INDEPENDENT_AMBULATORY_CARE_PROVIDER_SITE_OTHER): Payer: Medicaid Other | Admitting: Family

## 2020-03-30 ENCOUNTER — Ambulatory Visit: Payer: Medicaid Other | Admitting: Family

## 2020-03-30 ENCOUNTER — Other Ambulatory Visit (HOSPITAL_COMMUNITY): Payer: Self-pay

## 2020-03-30 ENCOUNTER — Encounter: Payer: Self-pay | Admitting: Family

## 2020-03-30 DIAGNOSIS — M545 Low back pain, unspecified: Secondary | ICD-10-CM

## 2020-03-30 DIAGNOSIS — M79604 Pain in right leg: Secondary | ICD-10-CM

## 2020-03-30 MED FILL — PRAZOSIN 1 MG CAPSULE: 1 | 30 days supply | Qty: 30 | Fill #0

## 2020-03-30 MED FILL — lamoTRIgine 100 MG TABS: 100 | 30 days supply | Qty: 30 | Fill #0

## 2020-03-30 NOTE — Progress Notes (Signed)
Office Visit Note   Patient: Meghan Wilson           Date of Birth: 20-Oct-1960           MRN: [id number] Visit Date: 03/30/2020              Requested by: Molli Hazard A, DO [address],  [place] [address] PCP: Marty Heck, DO  Chief Complaint  Patient presents with  . Right Knee - Pain  . Lower Back - Pain      HPI: Patient presents today in follow-up for her right lower extremity.  She presented with some symptoms consistent with right knee degenerative changes.  She was given an injection and it did not help her at all.  She describes now and remembers that a year ago just before the pandemic she was pulled by Dr. Sharol Given that she had some findings consistent with radiculopathy on her right lower side.  She gets neuropathic pain that goes down her thigh and in the back of her knee.  She has had multiple episodes of her leg giving way and is fallen to the ground.  She also gets numbness periodically in her leg and her foot  Assessment & Plan: Visit Diagnoses:  1. Low back pain radiating to right leg     Plan: We discussed starting on oral prednisone.  She has done this in the past and has had such a significant reaction that she has had to go to the emergency room.  Her spine x-ray does not show any acute changes.  This is been going on for well over a year.  We recommend an MRI and follow-up with Dr. Sharol Given  Follow-Up Instructions: No follow-ups on file.   Ortho Exam  Patient is alert, oriented, no adenopathy, well-dressed, normal affect, normal respiratory effort. She does have a straight leg raise.  She has good dorsiflexion plantarflexion strength.  Minimal pain with flexion and extension of her spine.  Imaging: No results found. No images are attached to the encounter.  Labs: Lab Results  Component Value Date   HGBA1C 6.0 (A) 11/23/2019   HGBA1C 6.1 (A) 05/04/2019   HGBA1C 6.7 (A) 02/15/2019   ESRSEDRATE 31 05/04/2019   CRP 2 05/04/2019    REPTSTATUS 05/23/2019 FINAL 05/22/2019   CULT MULTIPLE SPECIES PRESENT, SUGGEST RECOLLECTION (A) 05/22/2019     Lab Results  Component Value Date   ALBUMIN 4.2 05/22/2019   ALBUMIN 4.5 01/01/2018    No results found for: MG No results found for: VD25OH  No results found for: PREALBUMIN CBC EXTENDED Latest Ref Rng & Units 12/05/2019 05/22/2019 05/04/2019  WBC 4.0 - 10.5 K/uL 8.2 8.8 10.7  RBC 3.87 - 5.11 MIL/uL 4.85 4.75 4.48  HGB 12.0 - 15.0 g/dL 13.2 12.7 12.5  HCT 36 - 46 % 42.0 41.5 39.8  PLT 150 - 400 K/uL 292 334 307  NEUTROABS 1.7 - 7.7 K/uL 3.9 5.2 -  LYMPHSABS 0.7 - 4.0 K/uL 3.1 2.6 -     There is no height or weight on file to calculate BMI.  Orders:  Orders Placed This Encounter  Procedures  . XR Lumbar Spine 2-3 Views  . MR Lumbar Spine w/o contrast   No orders of the defined types were placed in this encounter.    Procedures: No procedures performed  Clinical Data: No additional findings.  ROS:  All other systems negative, except as noted in the HPI. Review of Systems  Objective: Vital  Signs: There were no vitals taken for this visit.  Specialty Comments:  No specialty comments available.  PMFS History: Patient Active Problem List   Diagnosis Date Noted  . Post concussion syndrome 12/20/2019  . Head injury 12/13/2019  . Episodic tension-type headache, not intractable 05/04/2019  . Melena 05/04/2019  . Type II diabetes mellitus ([place]) 02/16/2019  . Elevated blood pressure, situational 05/06/2018  . Pain of right knee after injury 03/10/2018  . Atopic dermatitis 02/15/2018  . 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 Encounter for smoking cessation counseling 01/02/2018  . Hyperlipidemia 01/01/2018  . Peptic gastritis 01/01/2018  . Morbid obesity ([place]) 08/03/2015  . Bipolar disorder ([place]) 08/03/2015   Past Medical History:  Diagnosis Date  . Bipolar depression ([place]) 09/2015  . Dizziness   . Elevated blood pressure,  situational   . GERD (gastroesophageal reflux disease)   . History of patellar fracture 2016  . History of tibial fracture 2016  . Hyperlipidemia   . Night terrors   . Night terrors, adult 09/2015    Family History  Problem Relation Age of Onset  . Hyperlipidemia Mother   . Hypertension Mother   . Arthritis/Rheumatoid Father   . Hyperlipidemia Maternal Aunt   . Hypertension Maternal Aunt   . Cancer Maternal Aunt     Past Surgical History:  Procedure Laterality Date  . CHOLECYSTECTOMY    . JOINT REPLACEMENT     Social History   Occupational History  . Not on file  Tobacco Use  . Smoking status: Former Smoker    Packs/day: 0.25    Years: 4.00    Pack years: 1.00    Quit date: 12/21/2017    Years since quitting: 2.2  . Smokeless tobacco: Never Used  Vaping Use  . Vaping Use: Never used  Substance and Sexual Activity  . Alcohol use: Yes    Comment: special occasions  . Drug use: No  . Sexual activity: Yes    Birth control/protection: None    Comment: Celibate

## 2020-04-02 NOTE — Addendum Note (Signed)
Addended by: Lawerance Cruel on: 04/02/2020 06:29 AM   Modules accepted: Orders

## 2020-04-05 ENCOUNTER — Other Ambulatory Visit: Payer: Self-pay | Admitting: Internal Medicine

## 2020-04-05 DIAGNOSIS — Z1231 Encounter for screening mammogram for malignant neoplasm of breast: Secondary | ICD-10-CM

## 2020-04-05 MED FILL — PANTOPRAZOLE SOD DR 40 MG T: 40 | 30 days supply | Qty: 30 | Fill #2

## 2020-04-05 MED FILL — ATORVASTATIN 80 MG TABLET: 80 | 30 days supply | Qty: 30 | Fill #5

## 2020-04-11 ENCOUNTER — Ambulatory Visit: Payer: Medicaid Other | Admitting: Podiatry

## 2020-04-11 ENCOUNTER — Other Ambulatory Visit: Payer: Self-pay

## 2020-04-11 ENCOUNTER — Encounter: Payer: Self-pay | Admitting: Podiatry

## 2020-04-11 ENCOUNTER — Telehealth: Payer: Self-pay | Admitting: Orthopedic Surgery

## 2020-04-11 VITALS — Temp 97.1°F

## 2020-04-11 DIAGNOSIS — L600 Ingrowing nail: Secondary | ICD-10-CM | POA: Diagnosis not present

## 2020-04-11 MED ORDER — NEOMYCIN-POLYMYXIN-HC 3.5-10000-1 OT SOLN: OTIC | 0 refills | Status: DC

## 2020-04-11 MED FILL — NEO/POLYMYXIN/HC EAR SOLN: 3.5-10000-1 | 30 days supply | Qty: 10 | Fill #0

## 2020-04-11 NOTE — Telephone Encounter (Signed)
Called patient left message to return call to schedule an appointment for MRI review with Dr Sharol Given.                  MRI is scheduled for 05/02/2020

## 2020-04-11 NOTE — Progress Notes (Signed)
Subjective:   Patient ID: Meghan Wilson, female   DOB: 59 y.o.   MRN: [id number]   HPI Patient presents stating her big toenails have really been bothering her and making it hard to walk and her second nail on her right has been thickened and she cannot wear shoe gear.  States that she has had corners removed in the past that is no longer successful and they are painful and she wants them removed permanently.  Patient does not smoke and is relatively active   Review of Systems  All other systems reviewed and are negative.       Objective:  Physical Exam Vitals and nursing note reviewed.  Constitutional:      Appearance: She is well-developed.  Pulmonary:     Effort: Pulmonary effort is normal.  Musculoskeletal:        General: Normal range of motion.  Skin:    General: Skin is warm.  Neurological:     Mental Status: She is alert.     Neurovascular status intact muscle strength found to be adequate range of motion within normal limits.  Patient is noted to have dystrophic thickened incurvated big toenails of both feet that are painful when pressed and make shoe gear difficult and the right second nail is abnormal dystrophic and painful.  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  I recommended correction of deformity and I explained procedures risk and patient wants surgery.  I explained at great length procedure and allowed her to sign consent form and today I infiltrated the each hallux with 60 mg like Marcaine mixture in the right second toe 60 mg like Marcaine mixture I removed 3 nails with exposed matrix and applied phenol for applications 30 seconds followed by alcohol lavage sterile dressing.  Gave instructed on  soaks and to leave dressings on 24 hours but take them off early if any throbbing were to occur and encouraged to call with questions concerns

## 2020-04-11 NOTE — Patient Instructions (Signed)

## 2020-04-12 ENCOUNTER — Telehealth: Payer: Self-pay | Admitting: *Deleted

## 2020-04-12 NOTE — Telephone Encounter (Signed)
Pt states she can not take ibuprofen, but can take tylenol#3, request a note to be out of work today and will pick it up in office.

## 2020-04-12 NOTE — Telephone Encounter (Signed)
Will be at front desk and she can pick up prescription and note to be out of work

## 2020-04-12 NOTE — Telephone Encounter (Signed)
Pt states she had an ingrown toenail procedure and needs a note for being out of work today and pain medication.

## 2020-04-13 ENCOUNTER — Other Ambulatory Visit: Payer: Self-pay | Admitting: Student

## 2020-04-13 ENCOUNTER — Ambulatory Visit: Payer: Medicaid Other | Admitting: Family

## 2020-04-13 ENCOUNTER — Telehealth: Payer: Self-pay

## 2020-04-13 ENCOUNTER — Other Ambulatory Visit: Payer: Self-pay | Admitting: Internal Medicine

## 2020-04-13 ENCOUNTER — Telehealth: Payer: Self-pay | Admitting: Student

## 2020-04-13 DIAGNOSIS — L209 Atopic dermatitis, unspecified: Secondary | ICD-10-CM

## 2020-04-13 MED FILL — METFORMIN HCL 500 MG TABS: 500 | 30 days supply | Qty: 60 | Fill #1

## 2020-04-13 NOTE — Telephone Encounter (Signed)
RTC, patient is requesting a RX for Replens vaginal moisturizer, RN informed patient this is available OTC, she verbalized understanding.  Pt also states her previous Dr. In [place] gave her: Triamcinolone cream .1% to use under her breast and abdomen in the summer d/t heat irritation and Triamcinolone ointment .5% to use on her hands d/t irritation from glove use.  She is requesting both RX's (ointment and cream) to [facility]. Thanks, SChaplin, RN,BSN

## 2020-04-13 NOTE — Telephone Encounter (Signed)
Requesting to speak with a nurse about meds refill.

## 2020-04-13 NOTE — Telephone Encounter (Signed)
Discussed with patient via phone call that I prefer to wait until her office visit before prescribing Triamcinolone cream and ointment for heat irritation and glove irritation just to make sure that it is clinically indicated.   Informed patient of her upcoming visit on May 04, 2020 with Dr. Sharon Seller.  Patient agreed with plan. No questions or concerns at this time.

## 2020-04-18 ENCOUNTER — Other Ambulatory Visit: Payer: Self-pay | Admitting: Podiatry

## 2020-04-18 ENCOUNTER — Telehealth: Payer: Self-pay | Admitting: Podiatry

## 2020-04-18 MED ORDER — NEOMYCIN-POLYMYXIN-HC 3.5-10000-1 OT SOLN: OTIC | 0 refills | Status: DC

## 2020-04-18 MED FILL — NEO/POLYMYXIN/HC EAR SOLN: 3.5-10000-1 | 50 days supply | Qty: 10 | Fill #0

## 2020-04-18 NOTE — Telephone Encounter (Signed)
Crystal:  I just reordered Neomycin drops. I talked to Dr. Paulla Dolly. He said patient will need to bring back the original script to our office so we can see she didn't fill that prescription. If she brings it back in, Dr. Paulla Dolly will give her another prescription for Tylenol #3.  Please call patient to let them know.  Thank you, Rolly Pancake, CMA (AAMA)

## 2020-04-18 NOTE — Telephone Encounter (Signed)
Meghan Wilson pt needed a new script because actually spilled the entire bottle of her drops for her foot the pharmacy shows trhey have not received what regal sent this morning please assist

## 2020-04-25 ENCOUNTER — Other Ambulatory Visit: Payer: Self-pay | Admitting: *Deleted

## 2020-04-25 NOTE — Telephone Encounter (Signed)
Had to reschedule appointment from 9/3 to 9/10 with Dr. Sharon Seller.  Patient is requesting Triamcinolone acetonide 0.5% for hands.  Stated Dr. Sharon Seller had given before and she is in desperate need of some more now.

## 2020-05-02 ENCOUNTER — Other Ambulatory Visit: Payer: Self-pay

## 2020-05-02 ENCOUNTER — Ambulatory Visit
Admission: RE | Admit: 2020-05-02 | Discharge: 2020-05-02 | Disposition: A | Discharge status: Home / Self Care | Payer: Medicaid Other | Source: Ambulatory Visit | Attending: Physician Assistant | Admitting: Physician Assistant

## 2020-05-02 ENCOUNTER — Encounter: Payer: Self-pay | Admitting: Podiatry

## 2020-05-02 ENCOUNTER — Ambulatory Visit: Payer: Medicaid Other | Admitting: Podiatry

## 2020-05-02 VITALS — Temp 96.2°F

## 2020-05-02 DIAGNOSIS — M545 Low back pain, unspecified: Secondary | ICD-10-CM

## 2020-05-02 DIAGNOSIS — L03031 Cellulitis of right toe: Secondary | ICD-10-CM | POA: Diagnosis not present

## 2020-05-02 DIAGNOSIS — M21619 Bunion of unspecified foot: Secondary | ICD-10-CM

## 2020-05-02 NOTE — Progress Notes (Signed)
Subjective:   Patient ID: Meghan Wilson, female   DOB: 59 y.o.   MRN: [id number]   HPI Patient presents with concerns about light drainage coming from her right hallux over left and also does have bunion deformity bilateral   ROS      Objective:  Physical Exam  Neurovascular status intact negative Bevelyn Buckles' sign noted with slight redness around the right over left hallux nail bed with some irritation of the tissue that is localized.  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  I do think she may have a very low-grade paronychia infection but localized and should heal uneventfully and I do not recommend current antibiotics but if any further redness or drainage were to occur she will start them and currently will just use topical.  Bunions I do not recommend correction of currently but if they start to become more bothersome or shoe gear becomes more aggravating that is something that could be addressed in the future and I did educate her on possible procedures to consider

## 2020-05-04 ENCOUNTER — Other Ambulatory Visit: Payer: Self-pay

## 2020-05-04 ENCOUNTER — Ambulatory Visit
Admission: RE | Admit: 2020-05-04 | Discharge: 2020-05-04 | Disposition: A | Discharge status: Home / Self Care | Payer: Medicaid Other | Source: Ambulatory Visit | Attending: Internal Medicine | Admitting: Internal Medicine

## 2020-05-04 ENCOUNTER — Telehealth (INDEPENDENT_AMBULATORY_CARE_PROVIDER_SITE_OTHER): Payer: Medicaid Other | Admitting: Podiatry

## 2020-05-04 ENCOUNTER — Encounter: Payer: Medicaid Other | Admitting: Internal Medicine

## 2020-05-04 DIAGNOSIS — Z1231 Encounter for screening mammogram for malignant neoplasm of breast: Secondary | ICD-10-CM

## 2020-05-05 NOTE — Telephone Encounter (Signed)
Patient called with question about showering after toenail procedure.

## 2020-05-08 MED FILL — ATORVASTATIN 80 MG TABLET: 80 | 30 days supply | Qty: 30 | Fill #6

## 2020-05-08 MED FILL — PANTOPRAZOLE SOD DR 40 MG T: 40 | 30 days supply | Qty: 30 | Fill #3

## 2020-05-08 MED FILL — lamoTRIgine 100 MG TABS: 100 | 30 days supply | Qty: 30 | Fill #1

## 2020-05-09 ENCOUNTER — Ambulatory Visit (INDEPENDENT_AMBULATORY_CARE_PROVIDER_SITE_OTHER): Payer: Medicaid Other | Admitting: Family

## 2020-05-09 ENCOUNTER — Encounter: Payer: Self-pay | Admitting: Family

## 2020-05-09 DIAGNOSIS — M5441 Lumbago with sciatica, right side: Secondary | ICD-10-CM | POA: Diagnosis not present

## 2020-05-09 NOTE — Progress Notes (Signed)
Office Visit Note   Patient: Meghan Wilson           Date of Birth: 08-03-1961           MRN: [id number] Visit Date: 05/09/2020              Requested by: Molli Hazard A, DO [address],  [place] [address] PCP: Marty Heck, DO  No chief complaint on file.     HPI: Patient is a 59 year old woman who presents today in follow-up.  She has had a MRI of her lumbar spine and presents today for MRI review.  She continues with right-sided low back and buttock pain this radiates down the posterior lateral aspect of her right leg occasionally even goes into her calf.  This is made worse by sitting she states that she must sit on a stool at work which is quite painful for her.  She also is unable to sleep on her right side if she lays on her right side she will have pain in her hip which will progressed to numbness of the hip and thigh   She has had multiple episodes of her leg giving way and is fallen to the ground.  She also gets numbness periodically in her leg and her foot  Assessment & Plan: Visit Diagnoses:  No diagnosis found.  Plan: Has previously declined prednisone burst for this pain.  States has had poor reactions to prednisone in the past.  We will refer her to Dr. Ernestina Patches hope that she will benefit from [facility].  Follow-Up Instructions: Return if symptoms worsen or fail to improve.   Ortho Exam  Patient is alert, oriented, no adenopathy, well-dressed, normal affect, normal respiratory effort. She does have a positive right straight leg raise.  She has good dorsiflexion plantarflexion strength.  Minimal pain with flexion and extension of her spine.  Imaging: No results found. No images are attached to the encounter.  Labs: Lab Results  Component Value Date   HGBA1C 6.0 (A) 11/23/2019   HGBA1C 6.1 (A) 05/04/2019   HGBA1C 6.7 (A) 02/15/2019   ESRSEDRATE 31 05/04/2019   CRP 2 05/04/2019   REPTSTATUS 05/23/2019 FINAL 05/22/2019   CULT MULTIPLE SPECIES  PRESENT, SUGGEST RECOLLECTION (A) 05/22/2019     Lab Results  Component Value Date   ALBUMIN 4.2 05/22/2019   ALBUMIN 4.5 01/01/2018    No results found for: MG No results found for: VD25OH  No results found for: PREALBUMIN CBC EXTENDED Latest Ref Rng & Units 12/05/2019 05/22/2019 05/04/2019  WBC 4.0 - 10.5 K/uL 8.2 8.8 10.7  RBC 3.87 - 5.11 MIL/uL 4.85 4.75 4.48  HGB 12.0 - 15.0 g/dL 13.2 12.7 12.5  HCT 36 - 46 % 42.0 41.5 39.8  PLT 150 - 400 K/uL 292 334 307  NEUTROABS 1.7 - 7.7 K/uL 3.9 5.2 -  LYMPHSABS 0.7 - 4.0 K/uL 3.1 2.6 -     There is no height or weight on file to calculate BMI.  Orders:  No orders of the defined types were placed in this encounter.  No orders of the defined types were placed in this encounter.    Procedures: No procedures performed  Clinical Data: No additional findings.  ROS:  All other systems negative, except as noted in the HPI. Review of Systems  Constitutional: Negative for chills and fever.  Musculoskeletal: Positive for back pain.  Neurological: Positive for numbness.    Objective: Vital Signs: There were no vitals taken for  this visit.  Specialty Comments:  No specialty comments available.  PMFS History: Patient Active Problem List   Diagnosis Date Noted  . Post concussion syndrome 12/20/2019  . Head injury 12/13/2019  . Episodic tension-type headache, not intractable 05/04/2019  . Melena 05/04/2019  . Type II diabetes mellitus ([place]) 02/16/2019  . Elevated blood pressure, situational 05/06/2018  . Pain of right knee after injury 03/10/2018  . Atopic dermatitis 02/15/2018  . 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 Encounter for smoking cessation counseling 01/02/2018  . Hyperlipidemia 01/01/2018  . Peptic gastritis 01/01/2018  . Morbid obesity ([place]) 08/03/2015  . Bipolar disorder ([place]) 08/03/2015   Past Medical History:  Diagnosis Date  . Bipolar depression ([place]) 09/2015  . Dizziness   . Elevated  blood pressure, situational   . GERD (gastroesophageal reflux disease)   . History of patellar fracture 2016  . History of tibial fracture 2016  . Hyperlipidemia   . Night terrors   . Night terrors, adult 09/2015    Family History  Problem Relation Age of Onset  . Hyperlipidemia Mother   . Hypertension Mother   . Arthritis/Rheumatoid Father   . Hyperlipidemia Maternal Aunt   . Hypertension Maternal Aunt   . Cancer Maternal Aunt     Past Surgical History:  Procedure Laterality Date  . CHOLECYSTECTOMY    . JOINT REPLACEMENT     Social History   Occupational History  . Not on file  Tobacco Use  . Smoking status: Former Smoker    Packs/day: 0.25    Years: 4.00    Pack years: 1.00    Quit date: 12/21/2017    Years since quitting: 2.3  . Smokeless tobacco: Never Used  Vaping Use  . Vaping Use: Never used  Substance and Sexual Activity  . Alcohol use: Yes    Comment: special occasions  . Drug use: No  . Sexual activity: Yes    Birth control/protection: None    Comment: Celibate

## 2020-05-10 ENCOUNTER — Encounter: Payer: Medicaid Other | Admitting: Internal Medicine

## 2020-05-11 ENCOUNTER — Other Ambulatory Visit: Payer: Self-pay

## 2020-05-11 ENCOUNTER — Ambulatory Visit (INDEPENDENT_AMBULATORY_CARE_PROVIDER_SITE_OTHER): Payer: Medicaid Other | Admitting: Student

## 2020-05-11 ENCOUNTER — Encounter: Payer: Self-pay | Admitting: Internal Medicine

## 2020-05-11 ENCOUNTER — Encounter: Payer: Self-pay | Admitting: Student

## 2020-05-11 VITALS — BP 147/91 | HR 86 | Temp 98.1°F | Ht 63.0 in | Wt 182.7 lb

## 2020-05-11 DIAGNOSIS — M25561 Pain in right knee: Secondary | ICD-10-CM | POA: Diagnosis not present

## 2020-05-11 DIAGNOSIS — R21 Rash and other nonspecific skin eruption: Secondary | ICD-10-CM | POA: Diagnosis not present

## 2020-05-11 DIAGNOSIS — M47816 Spondylosis without myelopathy or radiculopathy, lumbar region: Secondary | ICD-10-CM | POA: Insufficient documentation

## 2020-05-11 DIAGNOSIS — R030 Elevated blood-pressure reading, without diagnosis of hypertension: Secondary | ICD-10-CM

## 2020-05-11 DIAGNOSIS — E119 Type 2 diabetes mellitus without complications: Secondary | ICD-10-CM | POA: Diagnosis not present

## 2020-05-11 LAB — POCT GLYCOSYLATED HEMOGLOBIN (HGB A1C): Hemoglobin A1C: 6 % — AB (ref 4.0–5.6)

## 2020-05-11 LAB — GLUCOSE, CAPILLARY: Glucose-Capillary: 96 mg/dL (ref 70–99)

## 2020-05-11 MED ORDER — TRIAMCINOLONE ACETONIDE 0.5 % EX OINT: 1.0000 "application " | TOPICAL_OINTMENT | Freq: Two times a day (BID) | CUTANEOUS | 0 refills | Status: DC

## 2020-05-11 MED ORDER — HYDROCODONE-ACETAMINOPHEN 10-325 MG PO TABS: 1.0000 | ORAL_TABLET | Freq: Four times a day (QID) | ORAL | 0 refills | Status: DC | PRN

## 2020-05-11 MED ORDER — TRIAMCINOLONE ACETONIDE 0.1 % EX CREA: 1.0000 "application " | TOPICAL_CREAM | Freq: Two times a day (BID) | CUTANEOUS | 0 refills | Status: DC

## 2020-05-11 MED FILL — TRIAMCINOLONE 0.5% OINTMENT: 0.5 | 7 days supply | Qty: 30 | Fill #0

## 2020-05-11 MED FILL — HYDROCODON-APAP 10-325: 10-325 | 7 days supply | Qty: 30 | Fill #0

## 2020-05-11 MED FILL — TRIAMCINOLONE ACETONIDE 0.1: 0.1 | 7 days supply | Qty: 30 | Fill #0

## 2020-05-11 NOTE — Assessment & Plan Note (Signed)
MRI on 05/02/20 showed lumbar spondylosis with mild multilevel disc degeneration at L3-L4, L4-L5 and L5-S1.  Patient follows up with Dr. Sharol Given.  Patient states they did not address her concerns during her last visit.  Patient plans to set up another appointment with them.  Plan: -Start Norco 10-325 mg every 6 hours as needed for pain -Back exercises to relieve pain -Follow-up with orthopedic doctor (Dr. Sharol Given)

## 2020-05-11 NOTE — Assessment & Plan Note (Signed)
BP still elevated today to 147/91.  Patient with chronic pain in the right knee and recent diagnosis of lumbar spondylosis.  Plan: -Follow-up BMP -Consider starting patient on antihypertensive agent if BP still elevated at follow-up visit

## 2020-05-11 NOTE — Assessment & Plan Note (Addendum)
Patient reports rash underneath both breasts breast which she was being treated with triamcinolone cream.  Symptoms likely secondary to intertrigo.  She also endorses multiple small papules that has developed on her hands.  Patient states she works at [employer] she wears often irritates her hands. Symptoms likely secondary to allergic contact dermatitis.  Plan: -Continue triamcinolone 0.1% cream applied underneath breast as needed -Continue triamcinolone 0.5% ointment apply to hands as needed daily

## 2020-05-11 NOTE — Progress Notes (Signed)
   CC: Follow up/chronic right knee pain  HPI:  [name] is a 59 y.o. with PMH of GERD, diabetes lumbar spondylosis and hypertension who presents with chronic right knee pain.  Patient states she has had multiple screws in her right knee years ago after a freak accident in which her obese friend fell on her leg.  While she was in [place], she was seeing orthopedic doctor who was managing her pain with medications and injections.  She only takes pain medicine occasionally she is able to do her daily activities on most occasions and so the knee swells up.  Recently she has not had any pain meds to treat her pain.  She was seen by her orthopedic doctor but she saw the intern and they did not address her pain.  Patient states Dr. Sharol Given has recommended knee replacement but patient is willing to manage the pain for now until she absolutely needs the surgery.  Patient denies any recent falls but states her right leg sometimes gives out.  Patient endorses some back pain but denies any urinary symptoms, abdominal pain or headaches.  Please see problem based charting for evaluation, assessment and plan.  Past Medical History:  Diagnosis Date  . Bipolar depression ([place]) 09/2015  . Dizziness   . Elevated blood pressure, situational   . GERD (gastroesophageal reflux disease)   . History of patellar fracture 2016  . History of tibial fracture 2016  . Hyperlipidemia   . Night terrors   . Night terrors, adult 09/2015   Review of Systems:  All ROS negative otherwise as stated in the HPI  Physical Exam:  General: Pleasant woman sitting in chair. No acute distress. Well nourished, well developed. Appears stated age Head: Normocephalic, atraumatic w/o tenderness Eyes: PERRLA. Sclera is non-icteric Throat: MMM. No tonsillar swelling or exudate Neck: Supple without adenopathy. Thyroid gland midline without masees Cardiac: RRR. No murmurs, rubs or gallops. S1, S2. No lower extremities  edema Respiratory: Lungs CTAB. No wheezing or crackles. No increased WOB Abdominal: Soft, symmetric and non tender. No organomegaly. Normal bowel sounds Skin: Warm, dry and intact.  Multiple resolving papules on bilateral fingers. Extremities: Atraumatic. Full ROM. Pulse palpable. Neuro: A&O x 3. Moves all extremities Psych: Appropriate mood and affect. Normal judgement  Vitals:   05/11/20 0918  BP: (!) 147/91  Pulse: 86  Temp: 98.1 F (36.7 C)  TempSrc: Oral  SpO2: 100%  Weight: 182 lb 11.2 oz (82.9 kg)  Height: 5\' 3"  (1.6 m)    Assessment & Plan:   See Encounters Tab for problem based charting.  Patient discussed with Dr. Michelle Nasuti, MD, MPH

## 2020-05-11 NOTE — Assessment & Plan Note (Signed)
A1c today stable at 6.0.  Blood sugar well controlled on Metformin.  No symptoms of neuropathy.  Plan: -Continue Metformin 500 mg daily -Follow-up BMP -Follow-up microalbumin/creatinine ratio

## 2020-05-11 NOTE — Patient Instructions (Signed)
Thank you, Ms.Shadia Sires for allowing [place] to provide your care today. Today we discussed your right knee pain and your medication refill. We have prescribed you pain medicine for now but we would like for you to schedule an appointment with Dr. Sharol Given for a definitive plan for your knee pain. We have refilled your triamcinolone cream and ointment  I have ordered the following labs for you:   Lab Orders     BMP8+Anion Gap     Microalbumin / Creatinine Urine Ratio     Glucose, capillary     POC Hbg A1C   I will call if any are abnormal. All of your labs can be accessed through "My Chart".  I have ordered the following medication/changed the following medications:  1. Hydrocodone-Acetominophen (Norco) 10/325 mg take as needed for pain 2. Triamcinolone 0.1% cream apply underneath breast as needed 3. Triamcinolone 0.5% ointment apply to hands as needed  Please follow-up in as needed  Should you have any questions or concerns please call the internal medicine clinic at [phone].    Linwood Dibbles, MD, MPH [facility]   My Chart Access: [email or web address]   If you have not already done so, please get your COVID 19 vaccine  To schedule an appointment for a COVID vaccine choice any of the following: Go to [email or web address]   Go to [email or web address]                  Call [phone]                                     Call [phone] and select Option 2

## 2020-05-11 NOTE — Assessment & Plan Note (Signed)
Patient continues to have significant pain in her right knee.  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  States that she was receiving pain medicine with yearly injections while in [place] from her orthopedic doctor. Since she has moved to [facility], patient states Dr. Sharol Given has not appropriately address her concerns.  Patient states she understands she will eventually need a knee replacement but is currently able to tolerate the knee pain with some pain medicine which she only takes occasionally on day knee pain worsens and swelling up.  Plan: -Start Norco 10-325 mg 1 tablet every 6 hours as needed for pain -Follow-up with Dr. Sharol Given

## 2020-05-12 LAB — BMP8+ANION GAP
Anion Gap: 18 mmol/L (ref 10.0–18.0)
BUN/Creatinine Ratio: 19 (ref 12–28)
BUN: 10 mg/dL (ref 8–27)
CO2: 25 mmol/L (ref 20–29)
Calcium: 9.1 mg/dL (ref 8.7–10.3)
Chloride: 101 mmol/L (ref 96–106)
Creatinine, Ser: 0.53 mg/dL — ABNORMAL LOW (ref 0.57–1.00)
GFR calc Af Amer: 119 mL/min/{1.73_m2} (ref >59)
GFR calc non Af Amer: 104 mL/min/{1.73_m2} (ref >59)
Glucose: 105 mg/dL — ABNORMAL HIGH (ref 65–99)
Potassium: 4.6 mmol/L (ref 3.5–5.2)
Sodium: 144 mmol/L (ref 134–144)

## 2020-05-12 LAB — MICROALBUMIN / CREATININE URINE RATIO
Creatinine, Urine: 65.1 mg/dL
Microalb/Creat Ratio: <5 mg/g creat (ref 0–29)
Microalbumin, Urine: <3 ug/mL

## 2020-05-13 NOTE — Progress Notes (Signed)
Internal Medicine Clinic Attending  I saw and evaluated the patient.  I personally confirmed the key portions of the history and exam documented by Dr. Coy Saunas and I reviewed pertinent patient test results.  The assessment, diagnosis, and plan were formulated together and I agree with the documentation in the resident's note.   At next visit, if she is to continue medication, she will need to sign a pain contract.  She has reasonable expectations of the medication and only uses occasionally.  We advised her that she will need to not take medications from other providers including her dentist.  PDMP reviewed and she has been getting small supplies of medications from different providers.

## 2020-05-15 ENCOUNTER — Other Ambulatory Visit: Payer: Self-pay | Admitting: Physical Medicine and Rehabilitation

## 2020-05-15 DIAGNOSIS — F411 Generalized anxiety disorder: Secondary | ICD-10-CM

## 2020-05-15 MED ORDER — DIAZEPAM 5 MG PO TABS: ORAL_TABLET | ORAL | 0 refills | Status: DC

## 2020-05-15 MED FILL — diazePAM 5 MG TABS: 5 | 1 days supply | Qty: 2 | Fill #0

## 2020-05-15 NOTE — Progress Notes (Signed)
Pre-procedure diazepam ordered for pre-operative anxiety.  

## 2020-05-18 MED FILL — METFORMIN HCL 500 MG TABS: 500 | 30 days supply | Qty: 60 | Fill #2

## 2020-05-21 ENCOUNTER — Telehealth: Payer: Self-pay

## 2020-05-21 NOTE — Telephone Encounter (Addendum)
Pt called and stated that Dr. Paulla Dolly completed a surgery on her toenail. Her toe now a little yellow drainage oozing from the area. Please advise.

## 2020-05-22 NOTE — Telephone Encounter (Signed)
Not sure if she is talking about her toenail. Looked fine when I checked it on the1st but if still draining we can start her on antibiotic. As long as not allergic she could take 10 days of doxy bid. Normal to still have light drainage and mild irritation

## 2020-05-22 NOTE — Telephone Encounter (Signed)
Pt called again concerned about her foot please advise.

## 2020-05-23 MED FILL — PENICILLIN VK 500 MG TABLET: 500 | 10 days supply | Qty: 40 | Fill #0

## 2020-05-23 MED FILL — IBUPROFEN 800 MG TABS: 800 | 10 days supply | Qty: 30 | Fill #0

## 2020-05-31 ENCOUNTER — Telehealth: Payer: Self-pay | Admitting: Dietician

## 2020-05-31 NOTE — Telephone Encounter (Signed)
Called to follow up on her lifestyle change goals. Patient was at work and said she'd call back at a more convenient time for her.

## 2020-06-04 MED FILL — PANTOPRAZOLE SOD DR 40 MG T: 40 | 30 days supply | Qty: 30 | Fill #4

## 2020-06-04 MED FILL — lamoTRIgine 100 MG TABS: 100 | 30 days supply | Qty: 30 | Fill #2

## 2020-06-04 MED FILL — PRAZOSIN 1 MG CAPSULE: 1 | 30 days supply | Qty: 30 | Fill #1

## 2020-06-06 ENCOUNTER — Encounter: Payer: Self-pay | Admitting: Physical Medicine and Rehabilitation

## 2020-06-06 ENCOUNTER — Ambulatory Visit (INDEPENDENT_AMBULATORY_CARE_PROVIDER_SITE_OTHER): Payer: Medicaid Other | Admitting: Physical Medicine and Rehabilitation

## 2020-06-06 ENCOUNTER — Ambulatory Visit: Payer: Self-pay

## 2020-06-06 VITALS — BP 111/75 | HR 90

## 2020-06-06 DIAGNOSIS — M5441 Lumbago with sciatica, right side: Secondary | ICD-10-CM

## 2020-06-06 DIAGNOSIS — G8929 Other chronic pain: Secondary | ICD-10-CM

## 2020-06-06 DIAGNOSIS — M5416 Radiculopathy, lumbar region: Secondary | ICD-10-CM

## 2020-06-06 DIAGNOSIS — M47816 Spondylosis without myelopathy or radiculopathy, lumbar region: Secondary | ICD-10-CM

## 2020-06-06 MED ORDER — METHYLPREDNISOLONE ACETATE 80 MG/ML IJ SUSP
80.0000 mg | Freq: Once | INTRAMUSCULAR | Status: AC
  Administered 2020-06-06: 80 mg
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
## 2020-06-12 ENCOUNTER — Other Ambulatory Visit: Payer: Self-pay

## 2020-06-12 ENCOUNTER — Other Ambulatory Visit: Payer: Self-pay | Admitting: Student

## 2020-06-12 DIAGNOSIS — M25561 Pain in right knee: Secondary | ICD-10-CM

## 2020-06-12 DIAGNOSIS — M47816 Spondylosis without myelopathy or radiculopathy, lumbar region: Secondary | ICD-10-CM

## 2020-06-12 MED ORDER — HYDROCODONE-ACETAMINOPHEN 10-325 MG PO TABS: 1.0000 | ORAL_TABLET | Freq: Four times a day (QID) | ORAL | 0 refills | Status: DC | PRN

## 2020-06-12 MED FILL — HYDROCODON-APAP 10-325: 10-325 | 7 days supply | Qty: 30 | Fill #0

## 2020-06-12 MED FILL — ATORVASTATIN 80 MG TABLET: 80 | 30 days supply | Qty: 30 | Fill #7

## 2020-06-12 NOTE — Telephone Encounter (Signed)
HYDROcodone-acetaminophen (NORCO) 10-325 MG tablet, REFILL REQUEST @  [place], [place] - [address] [facility]. Phone:  [phone]  Fax:  [phone]

## 2020-06-13 ENCOUNTER — Other Ambulatory Visit: Payer: Self-pay

## 2020-06-13 ENCOUNTER — Ambulatory Visit: Payer: Medicaid Other | Admitting: Podiatry

## 2020-06-13 DIAGNOSIS — M21619 Bunion of unspecified foot: Secondary | ICD-10-CM

## 2020-06-13 DIAGNOSIS — L03031 Cellulitis of right toe: Secondary | ICD-10-CM

## 2020-06-13 NOTE — Progress Notes (Signed)
Subjective:   Patient ID: Meghan Wilson, female   DOB: 59 y.o.   MRN: [id number]   HPI Patient presents concerned about some blistering on the right hallux second toe and also her structural bunions   ROS      Objective:  Physical Exam  Neurovascular status unchanged with crusted nail sites with localized irritation of the beds and slight blistering of the proximal hallux and second digit right with structural bunion formation     Assessment:  Low-grade paronychia infection with low-grade blistering along with structural HAV     Plan:  H&P conditions reviewed and I went ahead today debrided out the lesions flushed the area is instructed on soaks and discussed bunions currently not recommending surgery even though it is becoming more apparent but that at 1 point in future it may need to be done

## 2020-06-20 ENCOUNTER — Encounter: Payer: Self-pay | Admitting: *Deleted

## 2020-06-20 LAB — HM DIABETES EYE EXAM

## 2020-06-22 ENCOUNTER — Other Ambulatory Visit (HOSPITAL_COMMUNITY): Payer: Self-pay

## 2020-07-04 MED FILL — PRAZOSIN 1 MG CAPSULE: 1 | 30 days supply | Qty: 60 | Fill #0

## 2020-07-04 MED FILL — lamoTRIgine 100 MG TABS: 100 | 30 days supply | Qty: 30 | Fill #0

## 2020-07-08 NOTE — Progress Notes (Signed)
[name] - 59 y.o. female MRN [id number]  Date of birth: 1961/04/20  Office Visit Note: Visit Date: 06/06/2020 PCP: Marty Heck, DO Referred by: Marty Heck, DO  Subjective: Chief Complaint  Patient presents with  . Right Leg - Pain   HPI: [name] is a 59 y.o. female who comes in today For evaluation management of chronic worsening right hip and leg pain consistent with L5 radiculitis.  She comes in at the request of [name], [place].  She is followed by Dr. [name] and is also seen Bevely Palmer Persons, PA-C.  Also just to note she sees Dr. [name] for headache issues at [facility].  She reports 8 out of 10 right hip and leg pain with paresthesia.  She is very irritated today that she says she is only seen by medical students every time she is supposed to see Dr. Sharol Given.  I did reassure her that [name] and Bevely Palmer are both qualified APP's.  Nonetheless she wants someone to explain her MRI to her before she proceeds with any injection.  She is very adamant and disturbed.  I did try to reassure her that we always go over this before we do anything at any point during the consultation today she can decide not to proceed with any treatment.  I did reassure her this is always up to her.  She tells me that she was told that she has "pinched nerves near her spine".  Fortunately, she does have an MRI to review and we reviewed this with pictures and images and spine model and reassured her that she actually has no pinched nerves at all.  We talked about the difference between radiculopathy and radiculitis as well as potential for neuropathy.  She is diabetic.  She does not have symptoms in both legs.  She has tried and failed medication management and has been reluctant to try prednisone.  She does not tolerate nonsteroidal anti-inflammatories.  She is taking some pain medication.  She has not had prior spine surgery.  Today she also endorses right arm numbness at night.   I did reassure her that that was not related to the lumbar spine that there is her 2 different issues.  She is really not had the upper arm evaluated.  She denies any left-sided complaints.  She feels like her whole right side is the issue.  Review of Systems  Musculoskeletal: Positive for joint pain.       Right hip and leg pain and right arm pain  Neurological: Positive for tingling.  All other systems reviewed and are negative.  Otherwise per HPI.  Assessment & Plan: Visit Diagnoses:  1. Lumbar radiculopathy   2. Spondylosis without myelopathy or radiculopathy, lumbar region   3. Chronic right-sided low back pain with right-sided sciatica     Plan: Findings:  Fairly classic L5 radiculitis on the right with MRI findings of multilevel facet arthropathy but in particular L5-S1 but also really more in particular on the left and not the right.  Pedicle edema shows likely irritation in the area on the left.  Her symptoms though are right-sided and do go down to the ankle and foot pretty classic for again an L5 radicular pain.  MRIs are not perfect we may be missing something small there.  I discussed care at length today for greater than 30 minutes and we did decide to complete a right L5 transforaminal dural steroid injection.  Depending on relief would  have her follow-up with Dr. Sharol Given from orthopedic complaints if it just does not help.  She can have this repeated from time to time if is very beneficial.  Could consider CT myelogram if nothing which is showing up in her pain with severe.  She will continue with current medications.  In terms of her right arm and hand this is likely carpal tunnel syndrome but could be cervical radiculitis as well.  I will defer that to Dr. Sharol Given at this point.    Meds & Orders:  Meds ordered this encounter  Medications  . methylPREDNISolone acetate (DEPO-MEDROL) injection 80 mg    Orders Placed This Encounter  Procedures  . XR C-ARM NO REPORT  . Epidural  Steroid injection    Follow-up: Return for visit to requesting physician as needed.   Procedures: No procedures performed  Lumbosacral Transforaminal Epidural Steroid Injection - Sub-Pedicular Approach with Fluoroscopic Guidance  Patient: Meghan Wilson      Date of Birth: 03-03-1961 MRN: [id number] PCP: Marty Heck, DO      Visit Date: 06/06/2020   Universal Protocol:    Date/Time: 06/06/2020  Consent Given By: the patient  Position: PRONE  Additional Comments: Vital signs were monitored before and after the procedure. Patient was prepped and draped in the usual sterile fashion. The correct patient, procedure, and site was verified.   Injection Procedure Details:   Procedure diagnoses:  1. Lumbar radiculopathy   2. Spondylosis without myelopathy or radiculopathy, lumbar region   3. Chronic right-sided low back pain with right-sided sciatica      Meds Administered:  Meds ordered this encounter  Medications  . methylPREDNISolone acetate (DEPO-MEDROL) injection 80 mg    Laterality: Right  Location/Site:  L5-S1  Needle size: 22 G  Needle type: Spinal  Needle Placement: Transforaminal  Findings:    -Comments: Excellent flow of contrast along the nerve, nerve root and into the epidural space.  Procedure Details: After squaring off the end-plates to get a true AP view, the C-arm was positioned so that an oblique view of the foramen as noted above was visualized. The target area is just inferior to the "nose of the scotty dog" or sub pedicular. The soft tissues overlying this structure were infiltrated with 2-3 ml. of 1% Lidocaine without Epinephrine.  The spinal needle was inserted toward the target using a "trajectory" view along the fluoroscope beam.  Under AP and lateral visualization, the needle was advanced so it did not puncture dura and was located close the 6 O'Clock position of the pedical in AP tracterory. Biplanar projections were used to confirm  position. Aspiration was confirmed to be negative for CSF and/or blood. A 1-2 ml. volume of Isovue-250 was injected and flow of contrast was noted at each level. Radiographs were obtained for documentation purposes.   After attaining the desired flow of contrast documented above, a 0.5 to 1.0 ml test dose of 0.25% Marcaine was injected into each respective transforaminal space.  The patient was observed for 90 seconds post injection.  After no sensory deficits were reported, and normal lower extremity motor function was noted,   the above injectate was administered so that equal amounts of the injectate were placed at each foramen (level) into the transforaminal epidural space.   Additional Comments:  The patient tolerated the procedure well Dressing: 2 x 2 sterile gauze and Band-Aid    Post-procedure details: Patient was observed during the procedure. Post-procedure instructions were reviewed.  Patient left the clinic in  stable condition.      Clinical History: Low back pain radiating to right leg. Lumbar radiculopathy, greater than 6 weeks. Additional history provided by scanning technologist: Patient reports right leg aches from hip down, low back pain for 5 years.  EXAM: MRI LUMBAR SPINE WITHOUT CONTRAST  TECHNIQUE: Multiplanar, multisequence MR imaging of the lumbar spine was performed. No intravenous contrast was administered.  COMPARISON:  Radiographs of the lumbar spine 03/30/2020  FINDINGS: Segmentation: 5 lumbar vertebrae in correlating with prior lumbar spine radiographs.  Alignment:  Trace L1-L2 retrolisthesis.  Vertebrae: Vertebral body height is maintained. There is mild degenerative endplate edema at S01-U93. Edema within the left L5 pedicle and articular pillar which may be degenerative or may reflect stress reaction. Mild degenerative edema within the left sacral ala.  Conus medullaris and cauda equina: Conus extends to the L1-L2 level. No signal  abnormality within the visualized distal spinal cord.  Paraspinal and other soft tissues: No abnormality identified within included portions of the abdomen/retroperitoneum. Atrophy of the lumbar paraspinal musculature.  Disc levels:  Mild multilevel disc degeneration at L3-L4, L4-L5 and L5-S1.  T12-L1: No significant disc herniation or stenosis.  L1-L2: Trace retrolisthesis. Minimal disc bulge. No significant spinal canal or foraminal stenosis.  L2-L3: No significant disc herniation or stenosis.  L3-L4: Small disc bulge. Minimal left subarticular narrowing without nerve root impingement. Central canal patent. Borderline mild bilateral neural foraminal narrowing.  L4-L5: Small disc bulge. Mild facet arthrosis with ligamentum flavum hypertrophy. Mild relative bilateral subarticular narrowing without nerve root impingement. Central canal patent. Mild bilateral neural foraminal narrowing (greater on the right).  L5-S1: Minimal disc bulge. Facet arthrosis which is moderate to advanced on the left. Ligamentum flavum hypertrophy. No significant spinal canal stenosis. Mild bilateral neural foraminal narrowing.  IMPRESSION: Lumbar spondylosis as outlined. No significant central canal stenosis at any level. There are sites of mild subarticular narrowing without nerve root impingement. Mild neural foraminal narrowing is present bilaterally at L3-L4, L4-L5 and L5-S1.  Of note, facet arthrosis is moderate/advanced on the left at L5-S1. There is marrow edema within the adjacent left L5 pedicle and articular pillar which may be degenerative or may reflect stress reaction.   Electronically Signed   By: Kellie Simmering DO   On: 05/02/2020 15:39   She reports that she quit smoking about 2 years ago. She has a 1.00 pack-year smoking history. She has never used smokeless tobacco.  Recent Labs    11/23/19 1630 05/11/20 0950  HGBA1C 6.0* 6.0*    Objective:  VS:  HT:    WT:    BMI:     BP:111/75  HR:90bpm  TEMP: ( )  RESP:  Physical Exam Constitutional:      General: She is not in acute distress.    Appearance: Normal appearance. She is obese. She is not ill-appearing.  HENT:     Head: Normocephalic and atraumatic.     Right Ear: External ear normal.     Left Ear: External ear normal.  Eyes:     Extraocular Movements: Extraocular movements intact.  Cardiovascular:     Rate and Rhythm: Normal rate.     Pulses: Normal pulses.  Musculoskeletal:        General: Tenderness present.     Cervical back: Normal range of motion and neck supple. Tenderness present. No rigidity.     Right lower leg: No edema.     Left lower leg: No edema.     Comments: Patient has good  distal strength with no pain over the greater trochanters.  No clonus or focal weakness.  Skin:    Findings: No erythema, lesion or rash.  Neurological:     General: No focal deficit present.     Mental Status: She is alert and oriented to person, place, and time.     Sensory: Sensory deficit present.     Motor: No weakness or abnormal muscle tone.     Coordination: Coordination normal.     Gait: Gait normal.  Psychiatric:        Mood and Affect: Mood normal.        Behavior: Behavior normal.     Ortho Exam  Imaging: No results found.  Past Medical/Family/Surgical/Social History: Medications & Allergies reviewed per EMR, new medications updated. Patient Active Problem List   Diagnosis Date Noted  . Lumbar spondylosis 05/11/2020  . Rash 05/11/2020  . Post concussion syndrome 12/20/2019  . Head injury 12/13/2019  . Episodic tension-type headache, not intractable 05/04/2019  . Melena 05/04/2019  . Type II diabetes mellitus ([place]) 02/16/2019  . Elevated blood pressure, situational 05/06/2018  . Pain of right knee after injury 03/10/2018  . Atopic dermatitis 02/15/2018  . 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 Encounter for smoking cessation counseling 01/02/2018  .  Hyperlipidemia 01/01/2018  . Peptic gastritis 01/01/2018  . Morbid obesity ([place]) 08/03/2015  . Bipolar disorder ([place]) 08/03/2015   Past Medical History:  Diagnosis Date  . Bipolar depression ([place]) 09/2015  . Dizziness   . Elevated blood pressure, situational   . GERD (gastroesophageal reflux disease)   . History of patellar fracture 2016  . History of tibial fracture 2016  . Hyperlipidemia   . Night terrors   . Night terrors, adult 09/2015   Family History  Problem Relation Age of Onset  . Hyperlipidemia Mother   . Hypertension Mother   . Arthritis/Rheumatoid Father   . Hyperlipidemia Maternal Aunt   . Hypertension Maternal Aunt   . Cancer Maternal Aunt    Past Surgical History:  Procedure Laterality Date  . CHOLECYSTECTOMY    . JOINT REPLACEMENT     Social History   Occupational History  . Not on file  Tobacco Use  . Smoking status: Former Smoker    Packs/day: 0.25    Years: 4.00    Pack years: 1.00    Quit date: 12/21/2017    Years since quitting: 2.5  . Smokeless tobacco: Never Used  Vaping Use  . Vaping Use: Never used  Substance and Sexual Activity  . Alcohol use: Yes    Comment: special occasions  . Drug use: No  . Sexual activity: Yes    Birth control/protection: None    Comment: Celibate

## 2020-07-08 NOTE — Procedures (Signed)
Lumbosacral Transforaminal Epidural Steroid Injection - Sub-Pedicular Approach with Fluoroscopic Guidance  Patient: Meghan Wilson      Date of Birth: February 26, 1961 MRN: [id number] PCP: Marty Heck, DO      Visit Date: 06/06/2020   Universal Protocol:    Date/Time: 06/06/2020  Consent Given By: the patient  Position: PRONE  Additional Comments: Vital signs were monitored before and after the procedure. Patient was prepped and draped in the usual sterile fashion. The correct patient, procedure, and site was verified.   Injection Procedure Details:   Procedure diagnoses:  1. Lumbar radiculopathy   2. Spondylosis without myelopathy or radiculopathy, lumbar region   3. Chronic right-sided low back pain with right-sided sciatica      Meds Administered:  Meds ordered this encounter  Medications  . methylPREDNISolone acetate (DEPO-MEDROL) injection 80 mg    Laterality: Right  Location/Site:  L5-S1  Needle size: 22 G  Needle type: Spinal  Needle Placement: Transforaminal  Findings:    -Comments: Excellent flow of contrast along the nerve, nerve root and into the epidural space.  Procedure Details: After squaring off the end-plates to get a true AP view, the C-arm was positioned so that an oblique view of the foramen as noted above was visualized. The target area is just inferior to the "nose of the scotty dog" or sub pedicular. The soft tissues overlying this structure were infiltrated with 2-3 ml. of 1% Lidocaine without Epinephrine.  The spinal needle was inserted toward the target using a "trajectory" view along the fluoroscope beam.  Under AP and lateral visualization, the needle was advanced so it did not puncture dura and was located close the 6 O'Clock position of the pedical in AP tracterory. Biplanar projections were used to confirm position. Aspiration was confirmed to be negative for CSF and/or blood. A 1-2 ml. volume of Isovue-250 was injected and flow of  contrast was noted at each level. Radiographs were obtained for documentation purposes.   After attaining the desired flow of contrast documented above, a 0.5 to 1.0 ml test dose of 0.25% Marcaine was injected into each respective transforaminal space.  The patient was observed for 90 seconds post injection.  After no sensory deficits were reported, and normal lower extremity motor function was noted,   the above injectate was administered so that equal amounts of the injectate were placed at each foramen (level) into the transforaminal epidural space.   Additional Comments:  The patient tolerated the procedure well Dressing: 2 x 2 sterile gauze and Band-Aid    Post-procedure details: Patient was observed during the procedure. Post-procedure instructions were reviewed.  Patient left the clinic in stable condition.

## 2020-07-11 MED FILL — METFORMIN HCL 500 MG TABS: 500 | 30 days supply | Qty: 60 | Fill #3

## 2020-07-11 MED FILL — PANTOPRAZOLE SOD DR 40 MG T: 40 | 30 days supply | Qty: 30 | Fill #5

## 2020-07-11 MED FILL — ATORVASTATIN 80 MG TABLET: 80 | 30 days supply | Qty: 30 | Fill #8

## 2020-07-23 NOTE — Progress Notes (Signed)
I have reviewed and agreed above plan. 

## 2020-08-10 ENCOUNTER — Telehealth: Payer: Self-pay | Admitting: Dietician

## 2020-08-10 NOTE — Telephone Encounter (Signed)
Returned call and explained Continuous glucose monitoring is typically for those who inject insulin. For the sore fingers she may want to discuss with her doctor and consider deceasing testing frequency to 3 days per week

## 2020-08-10 NOTE — Telephone Encounter (Signed)
Wants to know about the meters to stick on her arm ing to check blood sugars because her fingers are so sore.  Feet doing well, blood sugars: 97-101 in the mornings;  today 110 before then 3 hours later it was 101

## 2020-08-30 ENCOUNTER — Other Ambulatory Visit: Payer: Self-pay | Admitting: Internal Medicine

## 2020-08-30 DIAGNOSIS — E785 Hyperlipidemia, unspecified: Secondary | ICD-10-CM

## 2020-08-30 DIAGNOSIS — K219 Gastro-esophageal reflux disease without esophagitis: Secondary | ICD-10-CM

## 2020-08-30 MED FILL — lamoTRIgine 100 MG TABS: 100 | 30 days supply | Qty: 30 | Fill #1

## 2020-09-01 HISTORY — PX: MANDIBLE SURGERY: SHX707

## 2020-09-03 ENCOUNTER — Other Ambulatory Visit: Payer: Self-pay | Admitting: Internal Medicine

## 2020-09-03 MED FILL — ATORVASTATIN 80 MG TABLET: 80 | 30 days supply | Qty: 30 | Fill #0

## 2020-09-03 MED FILL — PANTOPRAZOLE SOD DR 40 MG T: 40 | 30 days supply | Qty: 30 | Fill #0

## 2020-09-18 ENCOUNTER — Emergency Department (HOSPITAL_COMMUNITY)
Admission: EM | Admit: 2020-09-18 | Discharge: 2020-09-18 | Disposition: A | Discharge status: Home / Self Care | Payer: Medicaid Other | Attending: Emergency Medicine | Admitting: Emergency Medicine

## 2020-09-18 ENCOUNTER — Other Ambulatory Visit: Payer: Self-pay

## 2020-09-18 ENCOUNTER — Encounter (HOSPITAL_COMMUNITY): Payer: Self-pay

## 2020-09-18 DIAGNOSIS — Z5321 Procedure and treatment not carried out due to patient leaving prior to being seen by health care provider: Secondary | ICD-10-CM | POA: Insufficient documentation

## 2020-09-18 DIAGNOSIS — M7989 Other specified soft tissue disorders: Secondary | ICD-10-CM | POA: Diagnosis not present

## 2020-09-18 DIAGNOSIS — M79601 Pain in right arm: Secondary | ICD-10-CM | POA: Insufficient documentation

## 2020-09-18 NOTE — ED Notes (Signed)
Called x2 for vitals recheck with no response and not visible in lobby

## 2020-09-18 NOTE — ED Notes (Signed)
Called x1 for vitals recheck with no response 
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
## 2020-09-19 ENCOUNTER — Ambulatory Visit (HOSPITAL_COMMUNITY)
Admission: EM | Admit: 2020-09-19 | Discharge: 2020-09-19 | Disposition: A | Discharge status: Home / Self Care | Payer: Medicaid Other | Attending: Medical Oncology | Admitting: Medical Oncology

## 2020-09-19 ENCOUNTER — Other Ambulatory Visit (HOSPITAL_COMMUNITY): Payer: Self-pay | Admitting: Medical Oncology

## 2020-09-19 ENCOUNTER — Other Ambulatory Visit: Payer: Self-pay

## 2020-09-19 ENCOUNTER — Other Ambulatory Visit: Payer: Self-pay | Admitting: Internal Medicine

## 2020-09-19 ENCOUNTER — Encounter (HOSPITAL_COMMUNITY): Payer: Self-pay

## 2020-09-19 DIAGNOSIS — L233 Allergic contact dermatitis due to drugs in contact with skin: Secondary | ICD-10-CM | POA: Diagnosis not present

## 2020-09-19 DIAGNOSIS — L03113 Cellulitis of right upper limb: Secondary | ICD-10-CM

## 2020-09-19 DIAGNOSIS — E119 Type 2 diabetes mellitus without complications: Secondary | ICD-10-CM

## 2020-09-19 DIAGNOSIS — Z23 Encounter for immunization: Secondary | ICD-10-CM | POA: Diagnosis not present

## 2020-09-19 DIAGNOSIS — R21 Rash and other nonspecific skin eruption: Secondary | ICD-10-CM

## 2020-09-19 MED ORDER — TETANUS-DIPHTH-ACELL PERTUSSIS 5-2.5-18.5 LF-MCG/0.5 IM SUSY
0.5000 mL | PREFILLED_SYRINGE | Freq: Once | INTRAMUSCULAR | Status: AC
  Administered 2020-09-19: 0.5 mL via INTRAMUSCULAR

## 2020-09-19 MED ORDER — TETANUS-DIPHTH-ACELL PERTUSSIS 5-2.5-18.5 LF-MCG/0.5 IM SUSY
PREFILLED_SYRINGE | INTRAMUSCULAR | Status: AC
  Filled 2020-09-19: qty 0.5

## 2020-09-19 MED ORDER — TETANUS-DIPHTHERIA TOXOIDS TD 5-2 LFU IM INJ: 0.5000 mL | INJECTION | Freq: Once | INTRAMUSCULAR | Status: DC

## 2020-09-19 MED ORDER — HYDROCORTISONE 1 % EX CREA: TOPICAL_CREAM | CUTANEOUS | 0 refills | Status: DC

## 2020-09-19 MED ORDER — ACCU-CHEK GUIDE VI STRP: ORAL_STRIP | 3 refills | Status: DC

## 2020-09-19 MED ORDER — ACCU-CHEK FASTCLIX LANCETS MISC: 3 refills | Status: DC

## 2020-09-19 MED ORDER — DOXYCYCLINE HYCLATE 100 MG PO CAPS: 100.0000 mg | ORAL_CAPSULE | Freq: Two times a day (BID) | ORAL | 0 refills | Status: DC

## 2020-09-19 MED FILL — DOXYCYCLINE HYCLATE 100 MG: 100 | 10 days supply | Qty: 20 | Fill #0

## 2020-09-19 MED FILL — ACCU-CHEK FASTCLIX LANCETS: 90 days supply | Qty: 102 | Fill #0

## 2020-09-19 MED FILL — ACCU-CHEK GUIDE TEST STRIP: 90 days supply | Qty: 100 | Fill #0
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
## 2020-09-19 NOTE — Telephone Encounter (Signed)
REFILL REQUEST  Accu-Chek FastClix Lancets MISC  glucose blood (ACCU-CHEK GUIDE) test strip   [place], [place] - [address] [facility]. Phone:  [phone]  Fax:  [phone]

## 2020-09-19 NOTE — ED Provider Notes (Signed)
[facility]    CSN: [id number] Arrival date & time: 09/19/20  1306      History   Chief Complaint Chief Complaint  Patient presents with  . Rash    HPI Meghan Wilson is a 60 y.o. female.   HPI  Rash: Pt reports that about 2 weeks ago she accidentally burned herself on her right forearm while getting a hot item out of an oven. She states that initially the area was red and painful. 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 After she did this she noticed purulent yellow discharge, worsening redness and bumps on her skin in the area of the burn. The bumps itch and burn. She denies fevers, history of MRSA or history of shingles.   Past Medical History:  Diagnosis Date  . Bipolar depression ([place]) 09/2015  . Dizziness   . Elevated blood pressure, situational   . GERD (gastroesophageal reflux disease)   . History of patellar fracture 2016  . History of tibial fracture 2016  . Hyperlipidemia   . Night terrors   . 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 Lumbar spondylosis 05/11/2020  . Rash 05/11/2020  . Post concussion syndrome 12/20/2019  . Head injury 12/13/2019  . Episodic tension-type headache, not intractable 05/04/2019  . Melena 05/04/2019  . Type II diabetes mellitus ([place]) 02/16/2019  . Elevated blood pressure, situational 05/06/2018  . Pain of right knee after injury 03/10/2018  . Atopic dermatitis 02/15/2018  . 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 Encounter for smoking cessation counseling 01/02/2018  . Hyperlipidemia 01/01/2018  . Peptic gastritis 01/01/2018  . Morbid obesity ([place]) 08/03/2015  . Bipolar disorder ([place]) 08/03/2015    Past Surgical History:  Procedure Laterality Date  . CHOLECYSTECTOMY    . JOINT REPLACEMENT      OB History    Gravida  3   Para      Term      Preterm      AB  1   Living  2      SAB  1   IAB      Ectopic      Multiple      Live Births  2            Home Medications    Prior to Admission medications   Medication Sig Start Date End Date Taking? Authorizing Provider  doxycycline (VIBRAMYCIN) 100 MG capsule Take 1 capsule (100 mg total) by mouth 2 (two) times daily. 09/19/20  Yes Nelwyn Salisbury M, PA-C  hydrocortisone cream 1 % Apply to affected area 2 times daily 09/19/20  Yes Covington, Sarah M, PA-C  Accu-Chek FastClix Lancets MISC Check blood sugar up to 7 times a week 09/19/20   Marianna Payment, MD  atorvastatin (LIPITOR) 80 MG tablet TAKE 1 TABLET (80 MG TOTAL) BY MOUTH DAILY. 09/03/20   Marianna Payment, MD  diazepam (VALIUM) 5 MG tablet Take 1 by mouth 1 hour  pre-procedure with very light food. May bring 2nd tablet to appointment. 05/15/20   Magnus Sinning, MD  fluticasone (FLONASE) 50 MCG/ACT nasal spray Place 1 spray into both nostrils daily as needed for allergies or rhinitis.    [provider]  glucose blood (ACCU-CHEK GUIDE) test strip Check blood sugar up to 7 times per week 09/19/20   Marianna Payment, MD  HYDROcodone-acetaminophen (  NORCO) 10-325 MG tablet Take 1 tablet by mouth every 6 (six) hours as needed for up to 30 doses. 06/12/20   Lacinda Axon, MD  lamoTRIgine (LAMICTAL) 100 MG tablet Take 100 mg by mouth daily.  12/10/17   [provider]  metFORMIN (GLUCOPHAGE) 500 MG tablet TAKE 1 TABLET BY MOUTH DAILY FOR FIRST WEEK, THEN 1 TABLET 2 TIMES DAILY THEREAFTER. 03/08/20   Marianna Payment, MD  neomycin-polymyxin-hydrocortisone (CORTISPORIN) OTIC solution Apply 1 to 2 drops to toe BID after soaking 04/18/20   Regal, Tamala Fothergill, DPM  pantoprazole (PROTONIX) 40 MG tablet TAKE 1 TABLET (40 MG TOTAL) BY MOUTH DAILY. 09/03/20 09/03/21  Marianna Payment, MD  penicillin v potassium (VEETID) 500 MG tablet Take 500 mg by mouth every 6 (six) hours. 05/23/20   [provider]  senna-docusate (SENOKOT-S) 8.6-50 MG tablet Take 1-2 tablets  by mouth 2 (two) times daily as needed for mild constipation or moderate constipation. 05/22/19   Gareth Morgan, MD    Family History Family History  Problem Relation Age of Onset  . Hyperlipidemia Mother   . Hypertension Mother   . Arthritis/Rheumatoid Father   . Hyperlipidemia Maternal Aunt   . Hypertension Maternal Aunt   . Cancer Maternal Aunt     Social History Social History   Tobacco Use  . Smoking status: Former Smoker    Packs/day: 0.25    Years: 4.00    Pack years: 1.00    Quit date: 12/21/2017    Years since quitting: 2.7  . Smokeless tobacco: Never Used  Vaping Use  . Vaping Use: Never used  Substance Use Topics  . Alcohol use: Yes    Comment: special occasions  . Drug use: No     Allergies   Depakote [divalproex sodium], Ibuprofen, and Latex   Review of Systems Review of Systems  Constitutional: Negative for fever.  Skin: Positive for rash and wound.    Physical Exam Triage Vital Signs ED Triage Vitals  Enc Vitals Group     BP 09/19/20 1446 134/81     Pulse Rate 09/19/20 1446 77     Resp 09/19/20 1446 16     Temp 09/19/20 1446 98.7 F (37.1 C)     Temp Source 09/19/20 1446 Oral     SpO2 09/19/20 1446 99 %     Weight 09/19/20 1444 187 lb (84.8 kg)     Height --      Head Circumference --      Peak Flow --      Pain Score 09/19/20 1443 7     Pain Loc --      Pain Edu? --      Excl. in [place]? --    No data found.  Updated Vital Signs BP 134/81 (BP Location: Right Arm)   Pulse 77   Temp 98.7 F (37.1 C) (Oral)   Resp 16   Wt 187 lb (84.8 kg)   SpO2 99%   BMI 33.13 kg/m      Physical Exam Constitutional:      General: She is not in acute distress.    Appearance: Normal appearance. She is not ill-appearing or toxic-appearing.  Skin:      Neurological:     Mental Status: She is alert.     UC Treatments / Results  Labs (all labs ordered are listed, but only abnormal results are displayed) Labs Reviewed - No data to  display  Radiology No results found.  Procedures Procedures (  including critical care time)  Medications Ordered in UC Medications  tetanus & diphtheria toxoids (adult) (TENIVAC) injection 0.5 mL (has no administration in time range)    Initial Impression / Assessment and Plan / UC Course  I have reviewed the triage vital signs and the nursing notes.  Pertinent labs & imaging results that were available during my care of the patient were reviewed by me and considered in my medical decision making (see chart for details).     New. Likely contact dermatitis secondary to new allergy to neosporin along with beginning stages of cellulitis. Treating with Doxycyline along with topical hydrocortisone. Discussed how to use along with common potential side effects. She will avoid neosporin and instead use Vaseline and gentle bandage. As she is unsure when her last TDap vaccine was we will administer this today. Precautions and side effects discussed. Red flag symptoms for her injury discussed.  Final Clinical Impressions(s) / UC Diagnoses   Final diagnoses:  Allergic contact dermatitis due to drugs in contact with skin  Cellulitis of right upper extremity   Discharge Instructions   None    ED Prescriptions    Medication Sig Dispense Auth. Provider   doxycycline (VIBRAMYCIN) 100 MG capsule Take 1 capsule (100 mg total) by mouth 2 (two) times daily. 20 capsule Covington, Sarah M, [place]   hydrocortisone cream 1 % Apply to affected area 2 times daily 15 g Covington, Sarah M, [place]     PDMP not reviewed this encounter.   Hughie Closs, [place] 09/19/20 1524

## 2020-09-21 ENCOUNTER — Other Ambulatory Visit (HOSPITAL_COMMUNITY): Payer: Self-pay

## 2020-09-21 MED FILL — PRAZOSIN 1 MG CAPSULE: 1 | 30 days supply | Qty: 60 | Fill #0

## 2020-09-21 MED FILL — lamoTRIgine 100 MG TABS: 100 | 30 days supply | Qty: 30 | Fill #0

## 2020-10-12 ENCOUNTER — Other Ambulatory Visit: Payer: Self-pay | Admitting: Student

## 2020-10-12 ENCOUNTER — Ambulatory Visit: Payer: Medicaid Other | Admitting: Student

## 2020-10-12 ENCOUNTER — Encounter: Payer: Self-pay | Admitting: Student

## 2020-10-12 VITALS — BP 140/79 | HR 95 | Temp 98.5°F | Wt 185.2 lb

## 2020-10-12 DIAGNOSIS — M25561 Pain in right knee: Secondary | ICD-10-CM

## 2020-10-12 DIAGNOSIS — R030 Elevated blood-pressure reading, without diagnosis of hypertension: Secondary | ICD-10-CM | POA: Diagnosis not present

## 2020-10-12 DIAGNOSIS — K219 Gastro-esophageal reflux disease without esophagitis: Secondary | ICD-10-CM

## 2020-10-12 DIAGNOSIS — R1013 Epigastric pain: Secondary | ICD-10-CM

## 2020-10-12 DIAGNOSIS — Z90710 Acquired absence of both cervix and uterus: Secondary | ICD-10-CM

## 2020-10-12 DIAGNOSIS — Z7984 Long term (current) use of oral hypoglycemic drugs: Secondary | ICD-10-CM | POA: Diagnosis not present

## 2020-10-12 DIAGNOSIS — K2960 Other gastritis without bleeding: Secondary | ICD-10-CM | POA: Diagnosis not present

## 2020-10-12 DIAGNOSIS — X58XXXD Exposure to other specified factors, subsequent encounter: Secondary | ICD-10-CM

## 2020-10-12 DIAGNOSIS — E119 Type 2 diabetes mellitus without complications: Secondary | ICD-10-CM

## 2020-10-12 DIAGNOSIS — K2970 Gastritis, unspecified, without bleeding: Secondary | ICD-10-CM

## 2020-10-12 DIAGNOSIS — M47816 Spondylosis without myelopathy or radiculopathy, lumbar region: Secondary | ICD-10-CM

## 2020-10-12 LAB — POCT GLYCOSYLATED HEMOGLOBIN (HGB A1C): Hemoglobin A1C: 5.9 % — AB (ref 4.0–5.6)

## 2020-10-12 LAB — GLUCOSE, CAPILLARY: Glucose-Capillary: 103 mg/dL — ABNORMAL HIGH (ref 70–99)

## 2020-10-12 MED ORDER — PANTOPRAZOLE SODIUM 40 MG PO TBEC
40.0000 mg | DELAYED_RELEASE_TABLET | Freq: Two times a day (BID) | ORAL | 3 refills | Status: DC
Start: 2020-10-12 — End: 2020-10-12

## 2020-10-12 MED ORDER — METFORMIN HCL ER 500 MG PO TB24
500.0000 mg | ORAL_TABLET | Freq: Every day | ORAL | 3 refills | Status: DC
Start: 2020-10-12 — End: 2020-10-12

## 2020-10-12 MED ORDER — HYDROCODONE-ACETAMINOPHEN 10-325 MG PO TABS
1.0000 | ORAL_TABLET | Freq: Four times a day (QID) | ORAL | 0 refills | Status: DC | PRN
Start: 2020-10-12 — End: 2020-11-09

## 2020-10-12 MED FILL — METFORMIN HCL ER 500 MG TB2: 500 | 90 days supply | Qty: 90 | Fill #0

## 2020-10-12 MED FILL — PANTOPRAZOLE SOD DR 40 MG T: 40 | 30 days supply | Qty: 30 | Fill #1

## 2020-10-12 MED FILL — HYDROCODON-APAP 10-325: 10-325 | 7 days supply | Qty: 30 | Fill #0

## 2020-10-12 MED FILL — ATORVASTATIN 80 MG TABLET: 80 | 30 days supply | Qty: 30 | Fill #1

## 2020-10-12 NOTE — Assessment & Plan Note (Signed)
Patient with history of moderate chronic gastritis and chronic duodenitis reports continuation of abdominal pain with burning of her epigastric region and chest that begins approximately ten minutes following consumption of meals. Patient has been taking pantropazole 40mg  daily without alleviation of her symptoms. She has stopped taking all NSAIDs as instructed by her PCP. She states that she previously followed closely with gastroenterology in [place]. She is greatly interested in re-establishing care with gastroenterology in [place]. -Referral for gastroenterology -Increase pantoprazole from 40mg  daily to 40mg  twice daily -Continue to avoid NSAIDs

## 2020-10-12 NOTE — Patient Instructions (Addendum)
Ms. Eshleman,  It was a pleasure meeting you in clinic today.  For your diabetes: Continue taking metformin 500mg  every morning with food. We will switch your refill to an extended release version which should help with any GI side effects.  For your blood pressure: We do not need to start any medications today, however we will continue to watch this condition closely.  For your stomach discomfort: We will increase your pantoprazole from 40mg  once daily to 40mg  twice daily. I will also place a referral to gastroenterology.  For your women's health needs: We will place a referral to OBGYN per your request.  For your right knee pain: We will refill your Norco which you should only take for severe pain. I have placed a new referral for you to follow-up with a new orthopedic surgeon for a second opinion.  If you have any questions or concerns, please don't hesitate to reach out.  Sincerely, Dr. Paulla Dolly, MD

## 2020-10-12 NOTE — Assessment & Plan Note (Signed)
Patient's initial blood pressure elevated to 144/83 then 140/79 on repeat. Patient is not currently prescribed an antihypertensive for management of this condition. -Continue to monitor

## 2020-10-12 NOTE — Assessment & Plan Note (Addendum)
Patient's hemoglobin A1c 5.9 from 6.0 today. She reports that she has been taking only metformin 500mg  daily with breakfast. She reported mild abdominal discomfort when taking two tablets daily. Patient's condition well controlled on this regimen and may benefit from further de-escalation.  -Transition metformin from 500mg  immediate release to extended release -Advised patient to stop checking blood glucose at home, however she reports that she desires to continue checking

## 2020-10-12 NOTE — Assessment & Plan Note (Signed)
Patient continues to endorse significant pain of her right knee. She reports that she has stopped following with Dr. Sharol Given because "all he cares about is surgery." Discussed with patient that she would benefit from knee replacement. She has recommended a referral to obtain a second opinion and is particularly interested in [facility] [place]. Advised patient that we can not place referral to [facility] but that she may call and see if they will take her. -Referral placed for second opinion with orthopedic surgery -Refilled Norco with plan to only take for severe pain

## 2020-10-12 NOTE — Progress Notes (Signed)
   CC: Follow-up  HPI:  [name] is a 60 y.o. female with past medical history as below who presents to clinic for follow-up. Refer to problem list for charting of this encounter.  Past Medical History:  Diagnosis Date  . Bipolar depression ([place]) 09/2015  . Dizziness   . Elevated blood pressure, situational   . GERD (gastroesophageal reflux disease)   . History of patellar fracture 2016  . History of tibial fracture 2016  . Hyperlipidemia   . Night terrors   . Night terrors, adult 09/2015   Review of Systems:  Endorses right knee pain, abdominal pain, nausea, swelling in abdomen, constipation, diarrhea. Denies chest pain, shortness of breath, vomiting.  Physical Exam:  Vitals:   10/12/20 1004 10/12/20 1100  BP: (!) 144/83 140/79  Pulse: 99 95  Temp: 98.5 F (36.9 C)   TempSrc: Oral   SpO2: 99%   Weight: 185 lb 3.2 oz (84 kg)    Physical Exam Vitals reviewed.  Constitutional:      General: She is not in acute distress.    Appearance: She is not ill-appearing.  Cardiovascular:     Rate and Rhythm: Normal rate and regular rhythm.     Pulses: Normal pulses.     Heart sounds: Normal heart sounds.  Pulmonary:     Effort: Pulmonary effort is normal. No respiratory distress.     Breath sounds: Normal breath sounds.  Abdominal:     General: Abdomen is flat. Bowel sounds are normal.     Palpations: Abdomen is soft.     Tenderness: There is abdominal tenderness.  Neurological:     General: No focal deficit present.     Mental Status: Mental status is at baseline.  Psychiatric:        Mood and Affect: Mood normal.        Behavior: Behavior normal.      Assessment & Plan:   See Encounters Tab for problem based charting.  Patient discussed with Dr. Heber [place]

## 2020-10-15 ENCOUNTER — Ambulatory Visit (HOSPITAL_COMMUNITY)
Admission: EM | Admit: 2020-10-15 | Discharge: 2020-10-15 | Disposition: A | Discharge status: Home / Self Care | Payer: Medicaid Other | Attending: Family Medicine | Admitting: Family Medicine

## 2020-10-15 ENCOUNTER — Encounter (HOSPITAL_COMMUNITY): Payer: Self-pay | Admitting: *Deleted

## 2020-10-15 ENCOUNTER — Ambulatory Visit (INDEPENDENT_AMBULATORY_CARE_PROVIDER_SITE_OTHER): Payer: Medicaid Other

## 2020-10-15 ENCOUNTER — Other Ambulatory Visit: Payer: Self-pay

## 2020-10-15 DIAGNOSIS — M25531 Pain in right wrist: Secondary | ICD-10-CM | POA: Diagnosis not present

## 2020-10-15 DIAGNOSIS — W19XXXA Unspecified fall, initial encounter: Secondary | ICD-10-CM | POA: Diagnosis not present

## 2020-10-15 DIAGNOSIS — M25562 Pain in left knee: Secondary | ICD-10-CM

## 2020-10-15 NOTE — ED Triage Notes (Signed)
PT reports a trip and fall on Friday and now has pain in Rt wrist and Lt knee.

## 2020-10-17 NOTE — ED Provider Notes (Signed)
[facility]   [id number] 10/15/20 Arrival Time: 1007  ASSESSMENT & PLAN:  1. Right wrist pain   2. Acute pain of left knee     I have personally viewed the imaging studies ordered this visit. No fractures appreciated.  She is comfortable with OTC analgesics. Activities as tolerated.  Orders Placed This Encounter  Procedures  . DG Wrist Complete Right  . DG Knee Complete 4 Views Left  . Apply Wrist brace    Recommend:  Follow-up Information    [facility].   Why: If worsening or failing to improve as anticipated. Contact information: [address] [place] [phone]               Reviewed expectations re: course of current medical issues. Questions answered. Outlined signs and symptoms indicating need for more acute intervention. Patient verbalized understanding. After Visit Summary given.  SUBJECTIVE: History from: patient. Meghan Wilson is a 60 y.o. female who reports R wrist pain and L knee pain s/p fall a few d ago. "Feels sore". Ambulatory without difficulties. No extremity sensation changes or weakness. No OTC tx.  Past Surgical History:  Procedure Laterality Date  . CHOLECYSTECTOMY    . JOINT REPLACEMENT        OBJECTIVE:  Vitals:   10/15/20 1043  BP: 136/71  Pulse: (!) 106  Resp: 18  Temp: 98.2 F (36.8 C)  TempSrc: Oral    General appearance: alert; no distress HEENT: [place]; AT Neck: supple with FROM Resp: unlabored respirations Extremities: . RUE: poorly localized TTP; FROM; no bruising; mild swelling . LLE: poorly localized TTP; FROM; no bruising; no swellling CV: brisk extremity capillary refill of all extremities; normal cap refill Neurologic: gait normal; normal sensation and strength of all extremities Psychological: alert and cooperative; normal mood and affect  Imaging: DG Wrist Complete Right  Result Date: 10/15/2020 CLINICAL DATA:  Right wrist pain  after a fall on an outstretched hand. Initial encounter. EXAM: RIGHT WRIST - COMPLETE 3+ VIEW COMPARISON:  None. FINDINGS: There is no evidence of fracture or dislocation. There is no evidence of arthropathy or other focal bone abnormality. Soft tissues are unremarkable. IMPRESSION: Negative exam. Electronically Signed   By: Inge Rise M.D.   On: 10/15/2020 11:16   DG Knee Complete 4 Views Left  Result Date: 10/15/2020 CLINICAL DATA:  Left knee pain after fall. EXAM: LEFT KNEE - COMPLETE 4+ VIEW COMPARISON:  None. FINDINGS: No evidence of fracture, dislocation, or joint effusion. No evidence of arthropathy or other focal bone abnormality. Soft tissues are unremarkable. IMPRESSION: Negative. Electronically Signed   By: Marijo Conception M.D.   On: 10/15/2020 11:17      Allergies  Allergen Reactions  . Depakote [Divalproex Sodium] Other (See Comments)    Hair fell out  . Ibuprofen Nausea And Vomiting  . Latex Rash    Past Medical History:  Diagnosis Date  . Bipolar depression ([place]) 09/2015  . Dizziness   . Elevated blood pressure, situational   . GERD (gastroesophageal reflux disease)   . History of patellar fracture 2016  . History of tibial fracture 2016  . Hyperlipidemia   . Night terrors   . Night terrors, adult 09/2015   Social History   Socioeconomic History  . Marital status: Divorced    Spouse name: Not on file  . Number of children: 2  . Years of education: 2 years of college  . Highest education  level: Not on file  Occupational History  . Not on file  Tobacco Use  . Smoking status: Former Smoker    Packs/day: 0.25    Years: 4.00    Pack years: 1.00    Quit date: 12/21/2017    Years since quitting: 2.8  . Smokeless tobacco: Never Used  Vaping Use  . Vaping Use: Never used  Substance and Sexual Activity  . Alcohol use: Yes    Comment: special occasions  . Drug use: No  . Sexual activity: Yes    Birth control/protection: None    Comment: Celibate  Other  Topics Concern  . Not on file  Social History Narrative   Lives at home with her fiance.   Right-handed.   No daily use of caffeine.   Social Determinants of Health   Financial Resource Strain: Not on file  Food Insecurity: Not on file  Transportation Needs: Not on file  Physical Activity: Not on file  Stress: Not on file  Social Connections: Not on file   Family History  Problem Relation Age of Onset  . Hyperlipidemia Mother   . Hypertension Mother   . Arthritis/Rheumatoid Father   . Hyperlipidemia Maternal Aunt   . Hypertension Maternal Aunt   . Cancer Maternal Aunt    Past Surgical History:  Procedure Laterality Date  . CHOLECYSTECTOMY    . JOINT REPLACEMENT        Vanessa Kick, MD 10/17/20 [phone]

## 2020-10-18 NOTE — Progress Notes (Signed)
Internal Medicine Clinic Attending  Case discussed with Dr. Johnson  At the time of the visit.  We reviewed the resident's history and exam and pertinent patient test results.  I agree with the assessment, diagnosis, and plan of care documented in the resident's note.  

## 2020-10-30 ENCOUNTER — Encounter: Payer: Self-pay | Admitting: *Deleted

## 2020-11-09 ENCOUNTER — Encounter: Payer: Self-pay | Admitting: Nurse Practitioner

## 2020-11-09 ENCOUNTER — Other Ambulatory Visit (INDEPENDENT_AMBULATORY_CARE_PROVIDER_SITE_OTHER): Payer: Medicaid Other

## 2020-11-09 ENCOUNTER — Other Ambulatory Visit: Payer: Self-pay | Admitting: Internal Medicine

## 2020-11-09 ENCOUNTER — Ambulatory Visit: Payer: Medicaid Other | Admitting: Internal Medicine

## 2020-11-09 ENCOUNTER — Ambulatory Visit: Payer: Medicaid Other | Admitting: Nurse Practitioner

## 2020-11-09 ENCOUNTER — Other Ambulatory Visit: Payer: Self-pay

## 2020-11-09 VITALS — BP 120/77 | HR 91 | Ht 63.0 in | Wt 182.0 lb

## 2020-11-09 VITALS — BP 124/77 | HR 90 | Wt 182.5 lb

## 2020-11-09 DIAGNOSIS — R1013 Epigastric pain: Secondary | ICD-10-CM

## 2020-11-09 DIAGNOSIS — R1032 Left lower quadrant pain: Secondary | ICD-10-CM

## 2020-11-09 DIAGNOSIS — Z23 Encounter for immunization: Secondary | ICD-10-CM | POA: Diagnosis present

## 2020-11-09 DIAGNOSIS — J302 Other seasonal allergic rhinitis: Secondary | ICD-10-CM | POA: Diagnosis not present

## 2020-11-09 DIAGNOSIS — N952 Postmenopausal atrophic vaginitis: Secondary | ICD-10-CM | POA: Diagnosis present

## 2020-11-09 DIAGNOSIS — T7840XD Allergy, unspecified, subsequent encounter: Secondary | ICD-10-CM

## 2020-11-09 DIAGNOSIS — K5900 Constipation, unspecified: Secondary | ICD-10-CM | POA: Diagnosis not present

## 2020-11-09 DIAGNOSIS — Z0000 Encounter for general adult medical examination without abnormal findings: Secondary | ICD-10-CM | POA: Diagnosis not present

## 2020-11-09 DIAGNOSIS — R112 Nausea with vomiting, unspecified: Secondary | ICD-10-CM | POA: Diagnosis not present

## 2020-11-09 DIAGNOSIS — R1010 Upper abdominal pain, unspecified: Secondary | ICD-10-CM

## 2020-11-09 DIAGNOSIS — K2970 Gastritis, unspecified, without bleeding: Secondary | ICD-10-CM | POA: Diagnosis not present

## 2020-11-09 DIAGNOSIS — R030 Elevated blood-pressure reading, without diagnosis of hypertension: Secondary | ICD-10-CM | POA: Diagnosis not present

## 2020-11-09 HISTORY — DX: Postmenopausal atrophic vaginitis: N95.2

## 2020-11-09 HISTORY — DX: Other seasonal allergic rhinitis: J30.2

## 2020-11-09 LAB — COMPREHENSIVE METABOLIC PANEL
ALT: 14 U/L (ref 0–35)
AST: 15 U/L (ref 0–37)
Albumin: 4.1 g/dL (ref 3.5–5.2)
Alkaline Phosphatase: 74 U/L (ref 39–117)
BUN: 10 mg/dL (ref 6–23)
CO2: 28 mEq/L (ref 19–32)
Calcium: 9.3 mg/dL (ref 8.4–10.5)
Chloride: 104 mEq/L (ref 96–112)
Creatinine, Ser: 0.55 mg/dL (ref 0.40–1.20)
GFR: 99.29 mL/min (ref >60.00)
Glucose, Bld: 108 mg/dL — ABNORMAL HIGH (ref 70–99)
Potassium: 3.8 mEq/L (ref 3.5–5.1)
Sodium: 140 mEq/L (ref 135–145)
Total Bilirubin: 0.4 mg/dL (ref 0.2–1.2)
Total Protein: 6.9 g/dL (ref 6.0–8.3)

## 2020-11-09 LAB — CBC WITH DIFFERENTIAL/PLATELET
Basophils Absolute: 0.1 10*3/uL (ref 0.0–0.1)
Basophils Relative: 1 % (ref 0.0–3.0)
Eosinophils Absolute: 0.1 10*3/uL (ref 0.0–0.7)
Eosinophils Relative: 0.7 % (ref 0.0–5.0)
HCT: 38.8 % (ref 36.0–46.0)
Hemoglobin: 12.6 g/dL (ref 12.0–15.0)
Lymphocytes Relative: 33.5 % (ref 12.0–46.0)
Lymphs Abs: 2.6 10*3/uL (ref 0.7–4.0)
MCHC: 32.4 g/dL (ref 30.0–36.0)
MCV: 84.7 fl (ref 78.0–100.0)
Monocytes Absolute: 0.7 10*3/uL (ref 0.1–1.0)
Monocytes Relative: 8.8 % (ref 3.0–12.0)
Neutro Abs: 4.3 10*3/uL (ref 1.4–7.7)
Neutrophils Relative %: 56 % (ref 43.0–77.0)
Platelets: 293 10*3/uL (ref 150.0–400.0)
RBC: 4.58 Mil/uL (ref 3.87–5.11)
RDW: 14.9 % (ref 11.5–15.5)
WBC: 7.6 10*3/uL (ref 4.0–10.5)

## 2020-11-09 LAB — TSH: TSH: 1.32 u[IU]/mL (ref 0.35–4.50)

## 2020-11-09 LAB — C-REACTIVE PROTEIN: CRP: <1 mg/dL (ref 0.5–20.0)

## 2020-11-09 MED ORDER — PREMARIN 0.625 MG/GM VA CREA: TOPICAL_CREAM | VAGINAL | 12 refills | Status: DC

## 2020-11-09 MED ORDER — ONDANSETRON 4 MG PO TBDP: ORAL_TABLET | ORAL | 1 refills | Status: DC

## 2020-11-09 MED ORDER — FLUTICASONE PROPIONATE 50 MCG/ACT NA SUSP: 1.0000 | Freq: Every day | NASAL | 2 refills | Status: DC | PRN

## 2020-11-09 MED ORDER — DICYCLOMINE HCL 10 MG PO CAPS: 10.0000 mg | ORAL_CAPSULE | Freq: Three times a day (TID) | ORAL | 0 refills | Status: DC | PRN

## 2020-11-09 MED FILL — FLUTICASONE PROP 50 MCG SPR: 50 | 60 days supply | Qty: 16 | Fill #0

## 2020-11-09 MED FILL — PREMARIN VAGINAL CREAM-APPL: 0.625 | 90 days supply | Qty: 30 | Fill #0

## 2020-11-09 MED FILL — DICYCLOMINE 10 MG CAPSULE: 10 | 10 days supply | Qty: 30 | Fill #0

## 2020-11-09 MED FILL — ONDANSETRON ODT 4 MG TABLET: 4 | 10 days supply | Qty: 30 | Fill #0

## 2020-11-09 NOTE — Patient Instructions (Addendum)
If you are age 61 or younger, your body mass index should be between 19-25. Your Body mass index is 32.24 kg/m. If this is out of the aformentioned range listed, please consider follow up with your Primary Care Provider.   LABS:  Lab work has been ordered for you today. Our lab is located in the basement. Press "B" on the elevator. The lab is located at the first door on the left as you exit the elevator.  HEALTHCARE LAWS AND MY CHART RESULTS: Due to recent changes in healthcare laws, you may see the results of your imaging and laboratory studies on MyChart before your provider has had a chance to review them.   We understand that in some cases there may be results that are confusing or concerning to you. Not all laboratory results come back in the same time frame and the provider may be waiting for multiple results in order to interpret others.  Please give [place] 48 hours in order for your provider to thoroughly review all the results before contacting the office for clarification of your results.  RECOMMENDATIONS: Miralax. Dissolve one capful in 8 ounces of water and drink before bed.  MEDICATION We have sent the following medication to your pharmacy for you to pick up at your convenience: Dicyclomine 10 MG 3 times a day as needed for abdominal pain. Ondansetron (ZOFRAN ODT) 4 MG disintegrating tablet- Place one tablet under the tongue every 6-8 hours as needed for nausea or vomiting.  IMAGING:  . You will be contacted by [facility] (Your caller ID will indicate phone # [phone]) within the next business 2 days to schedule your CT scan. If you have not heard from them within 2 business days, please call [facility] at [phone] to follow up on the status of your appointment.   Do not take Metformin the day of the CT scan or the day after.  Go to the ER if you start having severe abdominal pain.   It was great seeing you today! Thank you for  entrusting me with your care and choosing [facility].  Noralyn Pick, CRNP

## 2020-11-09 NOTE — Assessment & Plan Note (Signed)
BP Readings from Last 3 Encounters:  11/09/20 124/77  11/09/20 120/77  10/15/20 136/71   Here for f/u for prior elevated blood pressure. Last visit one month ago bp was 140/77. Today and last several visits with other physicians it is improved.   - cont. To monitor.

## 2020-11-09 NOTE — Progress Notes (Signed)
11/09/2020 [name] [id number] 01/09/1961   CHIEF COMPLAINT: Abdominal pain   HISTORY OF PRESENT ILLNESS:  Meghan Wilson is a 60 year old female with a past medical history of anxiety, depression, bipolar disorder, hyperlipidemia, borderline diabetes, sleep apnea does not use cpap, GERD. Past appendectomy, cholecystectomy and hysterectomy.  She presents to our office today as referred by Dr. Cato Mulligan for further evaluation for upper abdominal pain and bloat which started in 2019. Her upper abdominal pain has progressively worsened since October or November 2021. Her upper abdominal pain occurs almost daily and is worse after eating most foods. She vomited 3 times since Jan. 2022. The first vomiting episode occurred early or mid January, emesis consisted of mucous like secretions, no hematemesis or coffee ground emesis. The second episode was 4 weeks ago after eating lunch (she does not recall what she ate), she had a lot of gagging and vomited up the food she just ate. The third episode was 2 weeks ago and occurred shortly after eating (she does not recall what she ate). She was previously taking Pantoprazole 40mg  QD, her PCP increased the Pantoprazole 40mg  bid about 1 month ago. She has fairly constant nausea. She also has episodes of lower abdominal pain describes as a severe burning pain, she develops sweats and the abdominal pain abates in 1 1/2 hours. It is unclear how often she has the lower abdominal pain. She passes pellet like brown stools most days. She sometimes passes water with the pellets of stool. No rectal bleeding or melena. No NSAID use. No recent antibiotics. She underwent an EGD 06/16/2016 by Dr. Jon Billings with [facility] due to having epigastric pain and black stools. The EGD showed erythema and nodularity in the duodenal bulb consistent with duodenitis, chronic gastritis in the antrum without evidence of H. Pylori and a 83mm polyp in the upper  third of the esophagus. 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 A colonoscopy was done by Dr. Cathleen Fears 06/18/2016 which showed severe diverticulosis of the cecum, ascending colon, transverse colon and sigmoid colon and internal hemorrhoids, no polyps. She was advised to repeat a colonoscopy in 10 years. She has gained 10lb over the past year.    CBC Latest Ref Rng & Units 12/05/2019 05/22/2019 05/04/2019  WBC 4.0 - 10.5 K/uL 8.2 8.8 10.7  Hemoglobin 12.0 - 15.0 g/dL 13.2 12.7 12.5  Hematocrit 36.0 - 46.0 % 42.0 41.5 39.8  Platelets 150 - 400 K/uL 292 334 307   CMP Latest Ref Rng & Units 05/11/2020 12/05/2019 11/23/2019  Glucose 65 - 99 mg/dL 105(H) 110(H) 103(H)  BUN 8 - 27 mg/dL 10 15 14   Creatinine 0.57 - 1.00 mg/dL 0.53(L) 0.53 0.71  Sodium 134 - 144 mmol/L 144 141 141  Potassium 3.5 - 5.2 mmol/L 4.6 3.6 4.0  Chloride 96 - 106 mmol/L 101 104 103  CO2 20 - 29 mmol/L 25 29 23   Calcium 8.7 - 10.3 mg/dL 9.1 9.4 9.6  Total Protein 6.5 - 8.1 g/dL - - -  Total Bilirubin 0.3 - 1.2 mg/dL - - -  Alkaline Phos 38 - 126 U/L - - -  AST 15 - 41 U/L - - -  ALT 0 - 44 U/L - - -    Past Medical History:  Diagnosis Date  . Bipolar depression ([place]) 09/2015  . Dizziness   . Elevated blood pressure, situational   . GERD (gastroesophageal reflux disease)   . History of patellar fracture  2016  . History of tibial fracture 2016  . Hyperlipidemia   . Night terrors   . Night terrors, adult 09/2015   Past Surgical History:  Procedure Laterality Date  . CHOLECYSTECTOMY    . JOINT REPLACEMENT     Social History: She is single. She is an Forensic scientist. She has one son and one daughter. She smoked cigarettes on and off for 10 + years, quit smoking 2 years ago. She drinks 4  alcoholic beverage monthly. No drug use.   Family History:  Father deceased age 59 " he was killed", history of rheumatoid arthritis. Mother age 57 with HTN, hypercholesterolemia. Brother is healthy.     Allergies  Allergen Reactions  . Depakote [Divalproex Sodium] Other (See Comments)    Hair fell out  . Ibuprofen Nausea And Vomiting  . Latex Rash      Outpatient Encounter Medications as of 11/09/2020  Medication Sig  . Accu-Chek FastClix Lancets MISC Check blood sugar up to 7 times a week  . atorvastatin (LIPITOR) 80 MG tablet TAKE 1 TABLET (80 MG TOTAL) BY MOUTH DAILY.  . fluticasone (FLONASE) 50 MCG/ACT nasal spray Place 1 spray into both nostrils daily as needed for allergies or rhinitis.  Marland Kitchen glucose blood (ACCU-CHEK GUIDE) test strip Check blood sugar up to 7 times per week  . HYDROcodone-acetaminophen (NORCO) 10-325 MG tablet Take 1 tablet by mouth every 6 (six) hours as needed for up to 30 doses.  . hydrocortisone cream 1 % Apply to affected area 2 times daily  . lamoTRIgine (LAMICTAL) 100 MG tablet Take 100 mg by mouth daily.   . metFORMIN (GLUCOPHAGE-XR) 500 MG 24 hr tablet Take 1 tablet (500 mg total) by mouth daily with breakfast.  . pantoprazole (PROTONIX) 40 MG tablet Take 1 tablet (40 mg total) by mouth 2 (two) times daily before a meal.  . senna-docusate (SENOKOT-S) 8.6-50 MG tablet Take 1-2 tablets by mouth 2 (two) times daily as needed for mild constipation or moderate constipation.   No facility-administered encounter medications on file as of 11/09/2020.   REVIEW OF SYSTEMS:  Gen: Denies fever, sweats or chills. No weight loss.  CV: Denies chest pain, palpitations or edema. Resp: Denies cough, shortness of breath of hemoptysis.  GI: See HPI.  GU : Denies urinary burning, blood in urine, increased urinary frequency or incontinence. MS: + right knee pain.  Derm: Denies rash, itchiness, skin lesions or unhealing ulcers. Psych: Denies depression, anxiety, memory loss, suicidal ideation and confusion. Heme: Denies bruising, bleeding. Neuro:  Denies headaches, dizziness or paresthesias. Endo:  Denies any problems with DM, thyroid or adrenal function.  PHYSICAL  EXAM: BP 120/77   Pulse 91   Ht 5\' 3"  (1.6 m)   Wt 182 lb (82.6 kg)   SpO2 98%   BMI 32.24 kg/m   General: Well developed 60 year old female in no acute distress. Head: Normocephalic and atraumatic. Eyes:  Sclerae non-icteric, conjunctive pink. Ears: Normal auditory acuity. Mouth: Dentition intact. No ulcers or lesions.  Neck: Supple, no lymphadenopathy or thyromegaly.  Lungs: Clear bilaterally to auscultation without wheezes, crackles or rhonchi. Heart: Regular rate and rhythm. No murmur, rub or gallop appreciated.  Abdomen: Soft, non distended. Moderate epigastric and LLQ tenderness without rebound or guarding. Mild tenderness to the periumbilical area and RLQ. No masses. No hepatosplenomegaly. Normoactive bowel sounds x 4 quadrants.  Rectal: Deferred. Musculoskeletal: Symmetrical with no gross deformities. Skin: Warm and dry. No rash or lesions on visible extremities. Extremities:  No edema. Neurological: Alert oriented x 4, no focal deficits.  Psychological:  Alert and cooperative. Normal mood and affect.  ASSESSMENT AND PLAN:  76. 60 year old female with a history of gastritis/duodenitis with epigastric pain which has progressively worsened over the past 4 to 5 months. Three episodes of vomiting since Jan. 2022.  -CBC, CMP, CRP -CTAP with oral and IV contrast -Patient to hold Metformin day of CT and day after  -EGD to be scheduled after CTAP results received  -Continue Pantoprazole 40mg  po bid  -Zofran 4mg  ODT one tab Q 6 to 8 hrs PRN -Dicyclomine 10mg  one po tid PRN # 30, no refills   2. Episodic lower abdominal pain with sweats. LLQ tenderness on exam. History of severe diverticulosis to the cecum, ascending, transverse and sigmoid colon per colonoscopy 2017. -See plan in # 1 -Colonoscopy to be scheduled after CTAP results reviewed.   3. Constipation -Miralax as tolerate  -TSH      CC:  Seawell, Jaimie A, DO

## 2020-11-09 NOTE — Progress Notes (Signed)
   CC: elevated blood pressure   HPI:  [name] is a 60 y.o. with PMH as below.   Please see A&P for assessment of the patient's acute and chronic medical conditions.   Referred to GI last visit for ongoing abdominal pain, cramping, pain with hunger and taking her medications. She's had sweats as well, no weight loss. She has vomited about three times in the past four months, only bilious. No dark stools although stools have been hard and pellet like. She's cut out fried foods and is trying to eat healthier. She is currently feeling ok, just hungry.  Seen by GI this morning and prescribed bentyl and zofran. They are planning EGD.   She has been having vaginal dryness for the past several months. This has happened before and she was given premarin cream in 2017, which helped. There is som pain and skin tightening. No abnormal discharge, bleeding, or odor. She no longer has trouble with hot flashes. She requested gyn referral during her last visit but has not received a call yet.   Past Medical History:  Diagnosis Date  . Bipolar depression ([place]) 09/2015  . Dizziness   . Elevated blood pressure, situational   . GERD (gastroesophageal reflux disease)   . History of colon polyps   . History of patellar fracture 2016  . History of tibial fracture 2016  . Hyperlipidemia   . Night terrors   . Night terrors, adult 09/2015   Review of Systems:   10 point ROS negative except as noted in HPI  Physical Exam:   Constitution: NAD, appears stated age, obese HENT: [place]/AT Eyes: no icterus or injection  Cardio: RRR, no m/r/g, no LE edema  Respiratory: CTA, no w/r/r MSK: moving all extremities Neuro: normal affect, a&ox3 Skin: c/d/i    Vitals:   11/09/20 1014  BP: 124/77  Pulse: 90  SpO2: 100%  Weight: 182 lb 8 oz (82.8 kg)     Assessment & Plan:   See Encounters Tab for problem based charting.  Patient discussed with Dr. Angelia Mould

## 2020-11-09 NOTE — Assessment & Plan Note (Signed)
Referred to GI last visit for ongoing abdominal pain, cramping, pain with hunger and taking her medications. She's had sweats as well, no weight loss. She has vomited about three times in the past four months, only bilious. No dark stools although stools have been hard and pellet like. She's cut out fried foods and is trying to eat healthier. She is currently feeling ok, just hungry.  Seen by GI this morning and prescribed bentyl and zofran. They are planning EGD.   - cont. Protonix, bentyl, and zofran  - f/u EGD

## 2020-11-09 NOTE — Assessment & Plan Note (Signed)
-   flu vaccine today - [id number] vaccine today

## 2020-11-09 NOTE — Assessment & Plan Note (Signed)
Seasonal allergies have started up again. Flonase usually helps her.   - refilled flonase

## 2020-11-09 NOTE — Assessment & Plan Note (Signed)
She has been having vaginal dryness for the past several months. This has happened before and she was given premarin cream in 2017, which helped. There is som pain and skin tightening. No abnormal discharge, bleeding, or odor. She no longer has trouble with hot flashes. She requested gyn referral during her last visit but has not received a call yet.   - premarin cream daily for two weeks, then decrease to lowest effective dose  - will f/u gyn referral to make sure this is in place

## 2020-11-09 NOTE — Patient Instructions (Signed)
Thank you for allowing [place] to provide your care today. Today we discussed your vaginal atrophy and allergies    Please START taking  Premarin cream - use .5mg  daily for two weeks, then decrease to 3 times per week or stop if symptoms have resolved.   Please follow-up in 6 months for a1c and blood pressure check.    Please call the internal medicine center clinic if you have any questions or concerns, we may be able to help and keep you from a long and expensive emergency room wait. Our clinic and after hours phone number is [phone], the best time to call is Monday through Friday 9 am to 4 pm but there is always someone available 24/7 if you have an emergency. If you need medication refills please notify your pharmacy one week in advance and they will send [place] a request.

## 2020-11-10 DIAGNOSIS — R112 Nausea with vomiting, unspecified: Secondary | ICD-10-CM | POA: Insufficient documentation

## 2020-11-10 DIAGNOSIS — R1010 Upper abdominal pain, unspecified: Secondary | ICD-10-CM | POA: Insufficient documentation

## 2020-11-11 NOTE — Progress Notes (Signed)
Agree with assessment and plan as outlined.  

## 2020-11-13 NOTE — Progress Notes (Signed)
Internal Medicine Clinic Attending  Case discussed with Dr. Seawell  At the time of the visit.  We reviewed the resident's history and exam and pertinent patient test results.  I agree with the assessment, diagnosis, and plan of care documented in the resident's note.  

## 2020-11-14 ENCOUNTER — Other Ambulatory Visit (HOSPITAL_COMMUNITY): Payer: Self-pay | Admitting: Oral and Maxillofacial Surgery

## 2020-11-16 ENCOUNTER — Ambulatory Visit (HOSPITAL_COMMUNITY)
Admission: RE | Admit: 2020-11-16 | Discharge: 2020-11-16 | Disposition: A | Discharge status: Home / Self Care | Payer: Medicaid Other | Source: Ambulatory Visit | Attending: Nurse Practitioner | Admitting: Nurse Practitioner

## 2020-11-16 ENCOUNTER — Other Ambulatory Visit: Payer: Self-pay

## 2020-11-16 DIAGNOSIS — K5900 Constipation, unspecified: Secondary | ICD-10-CM | POA: Insufficient documentation

## 2020-11-16 DIAGNOSIS — R1032 Left lower quadrant pain: Secondary | ICD-10-CM | POA: Diagnosis present

## 2020-11-16 DIAGNOSIS — R112 Nausea with vomiting, unspecified: Secondary | ICD-10-CM | POA: Insufficient documentation

## 2020-11-16 DIAGNOSIS — R1013 Epigastric pain: Secondary | ICD-10-CM | POA: Insufficient documentation

## 2020-11-16 MED ORDER — IOHEXOL 300 MG/ML  SOLN
100.0000 mL | Freq: Once | INTRAMUSCULAR | Status: AC | PRN
  Administered 2020-11-16: 100 mL via INTRAVENOUS

## 2020-11-20 ENCOUNTER — Encounter: Payer: Self-pay | Admitting: Gastroenterology

## 2020-11-20 ENCOUNTER — Telehealth: Payer: Self-pay | Admitting: Nurse Practitioner

## 2020-11-20 NOTE — Telephone Encounter (Signed)
Pt wanted to know her CT results. Discussed results with pt and she is aware and had no further questions.

## 2020-11-20 NOTE — Telephone Encounter (Signed)
Pt scheduled Endocolon per Colleen's instructions. However, she would like to speak with her nurse about her results because she is not able to access pt portal Mychart. Pls call her at [phone].

## 2020-12-06 ENCOUNTER — Other Ambulatory Visit (HOSPITAL_COMMUNITY): Payer: Self-pay

## 2020-12-06 MED FILL — Lamotrigine Tab 100 MG: ORAL | 30 days supply | Qty: 30 | Fill #0 | Status: AC

## 2020-12-06 MED FILL — Pantoprazole Sodium EC Tab 40 MG (Base Equiv): ORAL | 30 days supply | Qty: 60 | Fill #0 | Status: AC

## 2020-12-06 MED FILL — Atorvastatin Calcium Tab 80 MG (Base Equivalent): ORAL | 30 days supply | Qty: 30 | Fill #0 | Status: AC

## 2020-12-10 ENCOUNTER — Other Ambulatory Visit (HOSPITAL_COMMUNITY): Payer: Self-pay

## 2020-12-10 MED ORDER — LIDOCAINE 5 % EX PTCH
1.0000 | MEDICATED_PATCH | Freq: Every day | CUTANEOUS | 0 refills | Status: DC
  Filled 2020-12-10: qty 2, 2d supply, fill #0

## 2020-12-19 ENCOUNTER — Ambulatory Visit: Payer: Medicaid Other | Admitting: Obstetrics & Gynecology

## 2020-12-20 IMAGING — CT CT ABD-PELV W/ CM
3 of 5 series · 17 of 46 positions shown, 19 images · IV contrast (omnipaque)
Comparison: None.

CLINICAL DATA: Constipation

EXAM:
CT ABDOMEN AND PELVIS WITH CONTRAST
TECHNIQUE: Multidetector CT imaging of the abdomen and pelvis was performed
using the standard protocol following bolus administration of
intravenous contrast.
CONTRAST:  100mL OMNIPAQUE IOHEXOL 300 MG/ML  SOLN

[Series 4: axial st · axial · 0.86mm/px · z∈[-312,+53]mm · 12 of 87 slices shown, 14 images]
[im 7/87  soft-tissue]
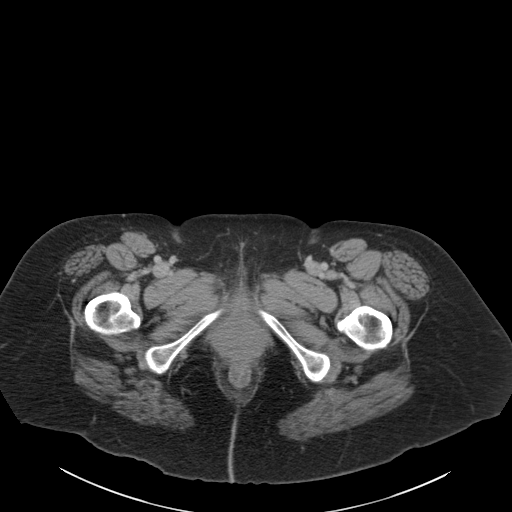
[im 7/87  bone]
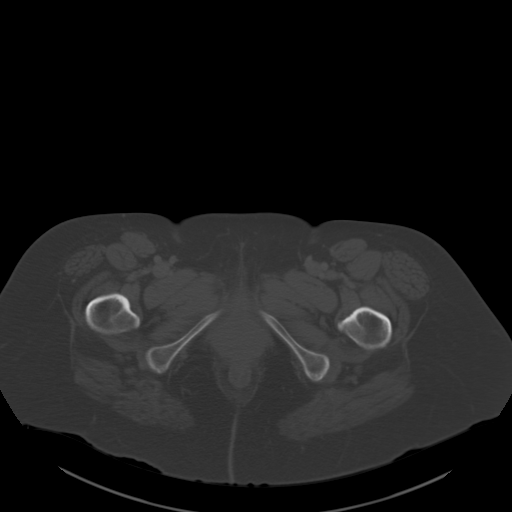
[im 14/87  soft-tissue]
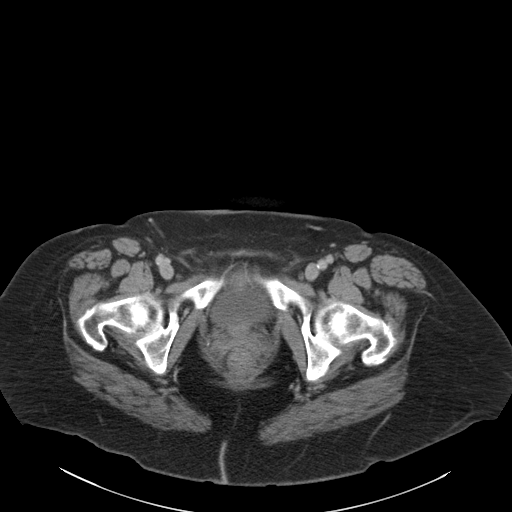
[im 20/87  soft-tissue]
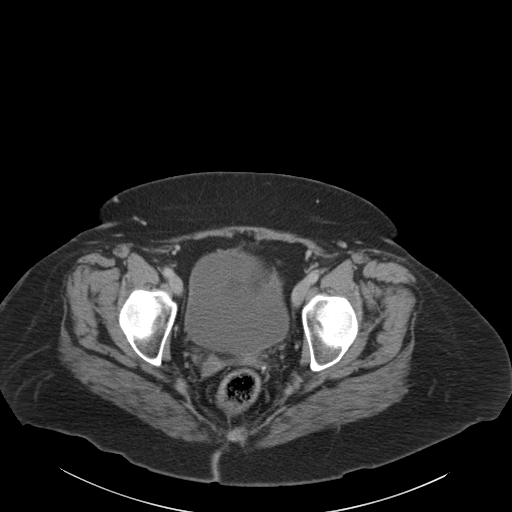
[im 27/87  soft-tissue]
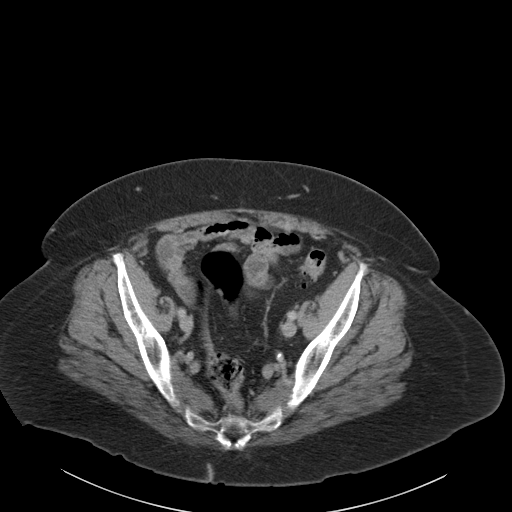
[im 34/87  soft-tissue]
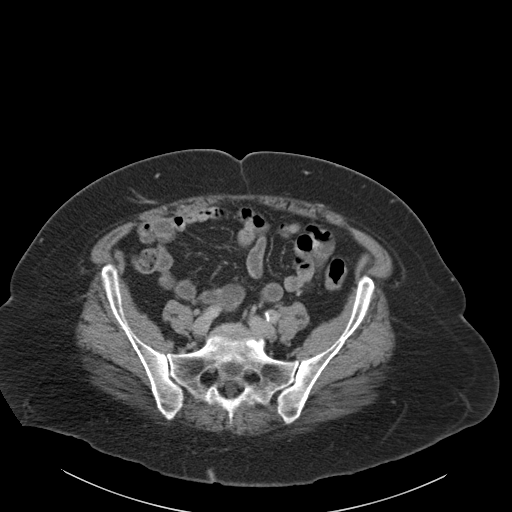
[im 40/87  soft-tissue]
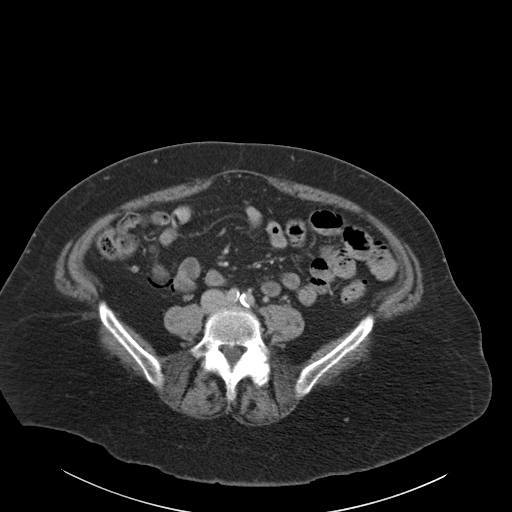
[im 47/87  soft-tissue]
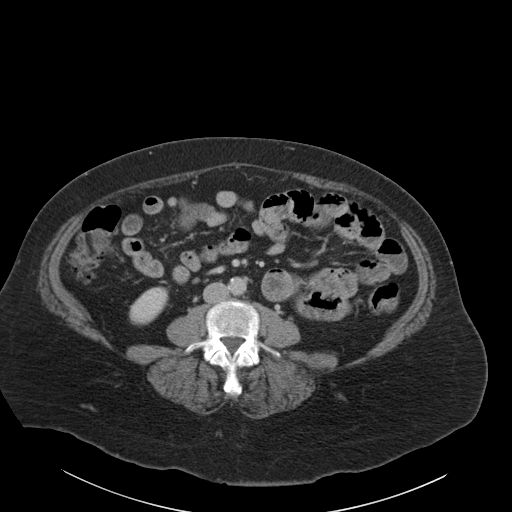
[im 53/87  soft-tissue]
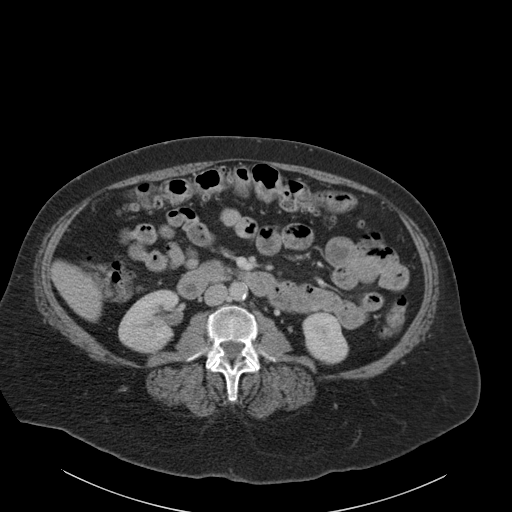
[im 60/87  soft-tissue]
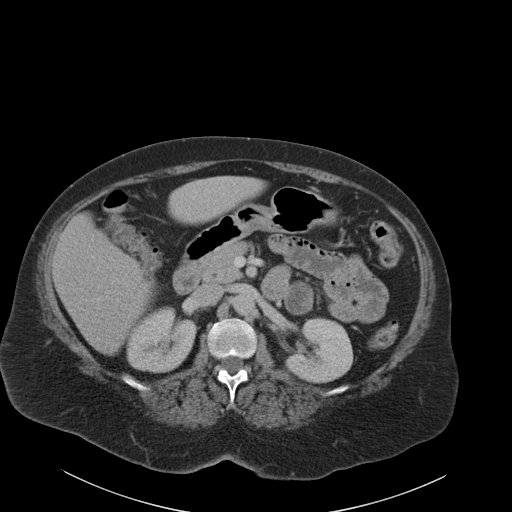
[im 60/87  bone]
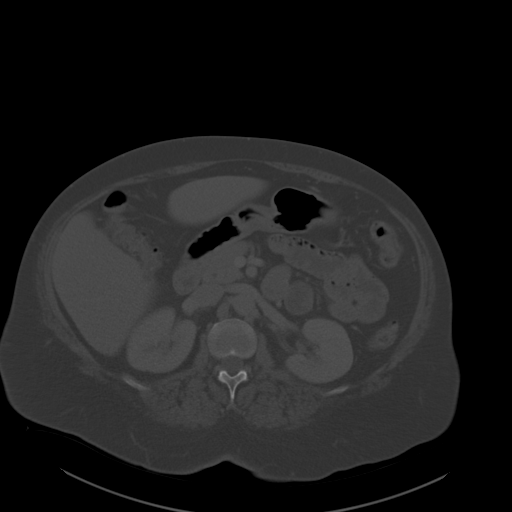
[im 67/87  soft-tissue]
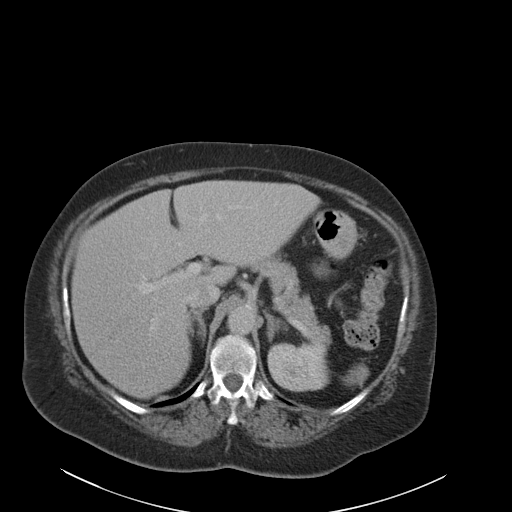
[im 73/87  soft-tissue]
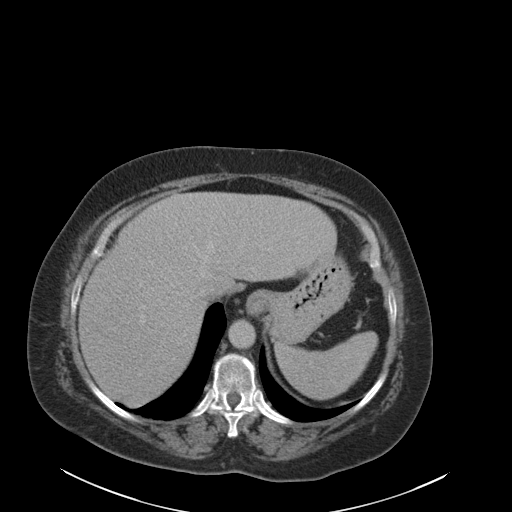
[im 80/87  soft-tissue]
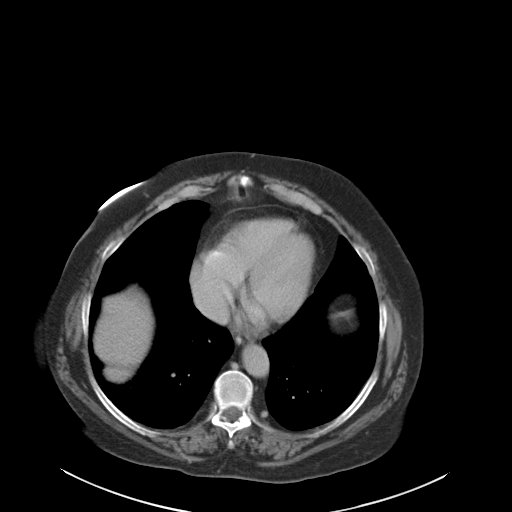

[Series 8: coronal st · coronal · 0.80mm/px · 3 of 144 slices shown]
[im 48/144  soft-tissue]
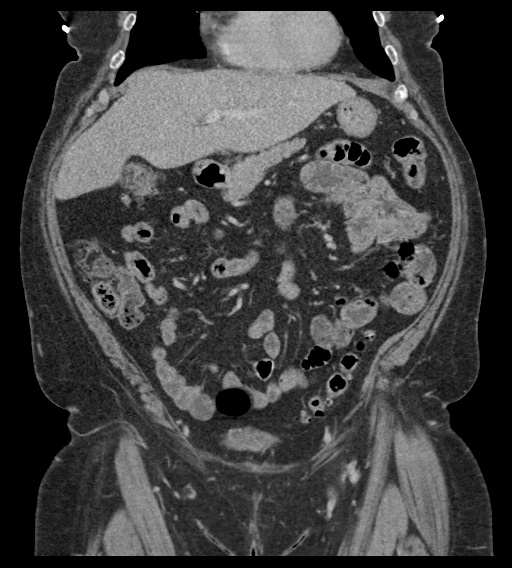
[im 64/144  soft-tissue]
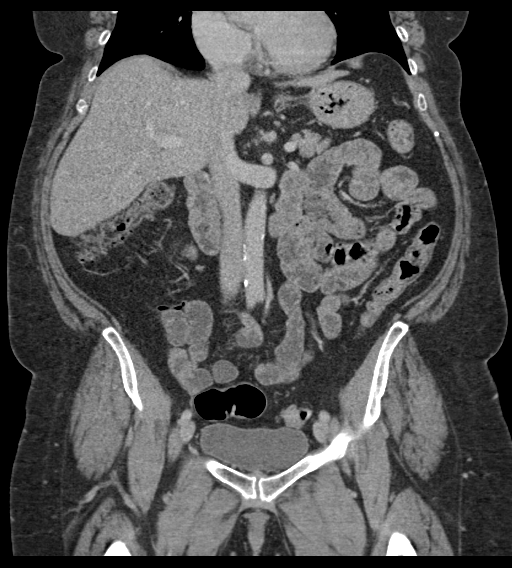
[im 80/144  soft-tissue]
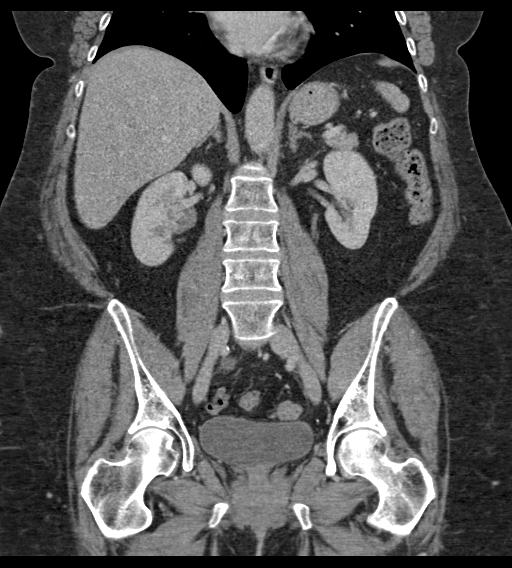

[Series 10: lung bases · axial · 0.86mm/px · z∈[-48,-24]mm · 2 of 75 slices shown]
[im 7/75  bone]
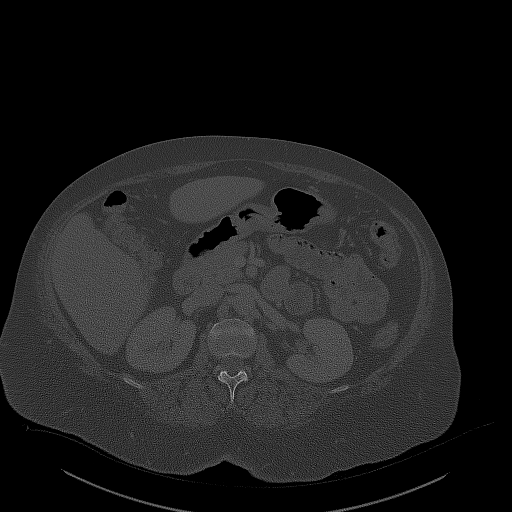
[im 19/75  bone]
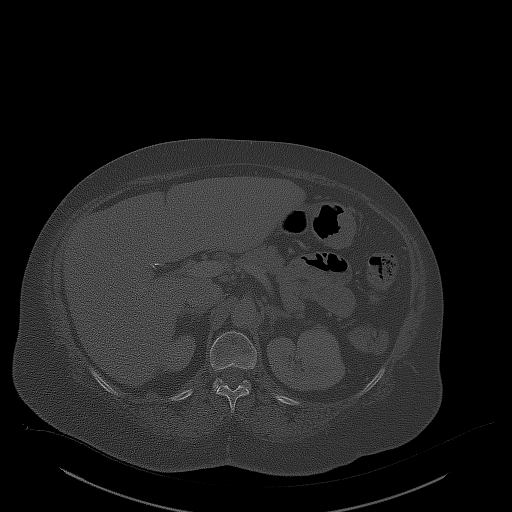

[17 of 46 positions shown; findings below may reference images not displayed]

FINDINGS: Lower chest: Lung bases are clear.

Hepatobiliary: Liver is within normal limits.

Status post cholecystectomy. No intrahepatic or extrahepatic ductal
dilatation.

Pancreas: Within normal limits.

Spleen: Within normal limits.

Adrenals/Urinary Tract: Adrenal glands are within normal limits.

Kidneys are within normal limits.  No hydronephrosis.

Bladder is within normal limits.

Stomach/Bowel: Stomach is within normal limits.

No evidence of bowel obstruction.

Normal appendix (series 4/image 21).

Extensive colonic diverticulosis, without evidence of
diverticulitis. No colonic wall thickening or inflammatory changes.

Vascular/Lymphatic: No evidence of abdominal aortic aneurysm.

Atherosclerotic calcifications of the abdominal aorta and branch
vessels.

No suspicious abdominopelvic lymphadenopathy.

Reproductive: Status post hysterectomy.

No adnexal mass.

Other: No abdominopelvic ascites.

Musculoskeletal: Visualized osseous structures are within normal
limits.
IMPRESSION: Extensive colonic diverticulosis, without evidence of
diverticulitis.

No evidence of bowel obstruction.  Normal appendix.

Prior cholecystectomy and hysterectomy.

## 2020-12-27 ENCOUNTER — Ambulatory Visit (AMBULATORY_SURGERY_CENTER): Payer: Self-pay | Admitting: *Deleted

## 2020-12-27 ENCOUNTER — Other Ambulatory Visit: Payer: Self-pay

## 2020-12-27 ENCOUNTER — Other Ambulatory Visit (HOSPITAL_COMMUNITY): Payer: Self-pay

## 2020-12-27 ENCOUNTER — Telehealth: Payer: Self-pay | Admitting: *Deleted

## 2020-12-27 VITALS — Ht 63.0 in | Wt 185.0 lb

## 2020-12-27 DIAGNOSIS — R1032 Left lower quadrant pain: Secondary | ICD-10-CM

## 2020-12-27 DIAGNOSIS — R1013 Epigastric pain: Secondary | ICD-10-CM

## 2020-12-27 DIAGNOSIS — K5900 Constipation, unspecified: Secondary | ICD-10-CM

## 2020-12-27 DIAGNOSIS — R112 Nausea with vomiting, unspecified: Secondary | ICD-10-CM

## 2020-12-27 MED ORDER — SUPREP BOWEL PREP KIT 17.5-3.13-1.6 GM/177ML PO SOLN
1.0000 | Freq: Once | ORAL | 0 refills | Status: AC
  Filled 2020-12-27: qty 354, 1d supply, fill #0

## 2020-12-27 NOTE — Telephone Encounter (Signed)
[id number] attempted to call patient from virtual Previsit.  Reached voicemail, Message left asking patient to return call today to avoid cancellation of upcoming EGD/Colonoscopy 01/11/21.

## 2020-12-27 NOTE — Progress Notes (Signed)
No egg or soy allergy known to patient  No issues with past sedation with any surgeries or procedures Patient denies ever being told they had issues or difficulty with intubation  No FH of Malignant Hyperthermia No diet pills per patient No home 02 use per patient  No blood thinners per patient  Pt denies issues with constipation  No A fib or A flutter  EMMI video to pt or via [place] 19 guidelines implemented in PV today with Pt and RN  Pt is fully vaccinated  for Covid    No coupon avail for Suprep  Due to the COVID-19 pandemic we are asking patients to follow certain guidelines.  Pt aware of COVID protocols and [facility] guidelines

## 2020-12-28 ENCOUNTER — Other Ambulatory Visit (HOSPITAL_COMMUNITY): Payer: Self-pay

## 2021-01-04 ENCOUNTER — Other Ambulatory Visit (HOSPITAL_COMMUNITY): Payer: Self-pay

## 2021-01-04 MED ORDER — PRAZOSIN HCL 1 MG PO CAPS
2.0000 mg | ORAL_CAPSULE | Freq: Every day | ORAL | 2 refills | Status: DC
  Filled 2021-01-04: qty 60, 30d supply, fill #0

## 2021-01-04 MED ORDER — LAMOTRIGINE 100 MG PO TABS
100.0000 mg | ORAL_TABLET | Freq: Every day | ORAL | 2 refills | Status: DC
  Filled 2021-01-04: qty 30, 30d supply, fill #0
  Filled 2021-02-26: qty 30, 30d supply, fill #1
  Filled 2021-03-26: qty 30, 30d supply, fill #2

## 2021-01-10 ENCOUNTER — Encounter: Payer: Self-pay | Admitting: Certified Registered Nurse Anesthetist

## 2021-01-11 ENCOUNTER — Ambulatory Visit (AMBULATORY_SURGERY_CENTER): Payer: Medicaid Other | Admitting: Gastroenterology

## 2021-01-11 ENCOUNTER — Encounter: Payer: Self-pay | Admitting: Gastroenterology

## 2021-01-11 ENCOUNTER — Other Ambulatory Visit: Payer: Self-pay

## 2021-01-11 ENCOUNTER — Other Ambulatory Visit (HOSPITAL_COMMUNITY): Payer: Self-pay

## 2021-01-11 VITALS — BP 119/86 | HR 96 | Temp 98.4°F | Resp 21 | Ht 63.0 in | Wt 185.0 lb

## 2021-01-11 DIAGNOSIS — K2970 Gastritis, unspecified, without bleeding: Secondary | ICD-10-CM

## 2021-01-11 DIAGNOSIS — D127 Benign neoplasm of rectosigmoid junction: Secondary | ICD-10-CM

## 2021-01-11 DIAGNOSIS — K5730 Diverticulosis of large intestine without perforation or abscess without bleeding: Secondary | ICD-10-CM

## 2021-01-11 DIAGNOSIS — B9681 Helicobacter pylori [H. pylori] as the cause of diseases classified elsewhere: Secondary | ICD-10-CM

## 2021-01-11 DIAGNOSIS — K3189 Other diseases of stomach and duodenum: Secondary | ICD-10-CM

## 2021-01-11 DIAGNOSIS — K648 Other hemorrhoids: Secondary | ICD-10-CM | POA: Diagnosis not present

## 2021-01-11 DIAGNOSIS — K2980 Duodenitis without bleeding: Secondary | ICD-10-CM

## 2021-01-11 DIAGNOSIS — R1032 Left lower quadrant pain: Secondary | ICD-10-CM

## 2021-01-11 DIAGNOSIS — D128 Benign neoplasm of rectum: Secondary | ICD-10-CM | POA: Diagnosis not present

## 2021-01-11 DIAGNOSIS — R112 Nausea with vomiting, unspecified: Secondary | ICD-10-CM

## 2021-01-11 DIAGNOSIS — R1013 Epigastric pain: Secondary | ICD-10-CM

## 2021-01-11 DIAGNOSIS — K2950 Unspecified chronic gastritis without bleeding: Secondary | ICD-10-CM | POA: Diagnosis not present

## 2021-01-11 MED ORDER — SODIUM CHLORIDE 0.9 % IV SOLN: 500.0000 mL | Freq: Once | INTRAVENOUS | Status: DC

## 2021-01-11 NOTE — Patient Instructions (Signed)
Read all of the handouts given to you by your recovery room nurse. ? ?YOU HAD AN ENDOSCOPIC PROCEDURE TODAY AT THE [facility]:   Refer to the procedure report that was given to you for any specific questions about what was found during the examination.  If the procedure report does not answer your questions, please call your gastroenterologist to clarify.  If you requested that your care partner not be given the details of your procedure findings, then the procedure report has been included in a sealed envelope for you to review at your convenience later. ? ?YOU SHOULD EXPECT: Some feelings of bloating in the abdomen. Passage of more gas than usual.  Walking can help get rid of the air that was put into your GI tract during the procedure and reduce the bloating. If you had a lower endoscopy (such as a colonoscopy or flexible sigmoidoscopy) you may notice spotting of blood in your stool or on the toilet paper. If you underwent a bowel prep for your procedure, you may not have a normal bowel movement for a few days. ? ?Please Note:  You might notice some irritation and congestion in your nose or some drainage.  This is from the oxygen used during your procedure.  There is no need for concern and it should clear up in a day or so. ? ?SYMPTOMS TO REPORT IMMEDIATELY: ? ?Following lower endoscopy (colonoscopy or flexible sigmoidoscopy): ? Excessive amounts of blood in the stool ? Significant tenderness or worsening of abdominal pains ? Swelling of the abdomen that is new, acute ? Fever of 100?F or higher ? ?Following upper endoscopy (EGD) ? Vomiting of blood or coffee ground material ? New chest pain or pain under the shoulder blades ? Painful or persistently difficult swallowing ? New shortness of breath ? Fever of 100?F or higher ? Black, tarry-looking stools ? ?For urgent or emergent issues, a gastroenterologist can be reached at any hour by calling [phone]. ?Do not use MyChart messaging for urgent  concerns.  ? ? ?DIET:  We do recommend a small meal at first, but then you may proceed to your regular diet.  Drink plenty of fluids but you should avoid alcoholic beverages for 24 hours. ? ?ACTIVITY:  You should plan to take it easy for the rest of today and you should NOT DRIVE or use heavy machinery until tomorrow (because of the sedation medicines used during the test).   ? ?FOLLOW UP: ?Our staff will call the number listed on your records 48-72 hours following your procedure to check on you and address any questions or concerns that you may have regarding the information given to you following your procedure. If we do not reach you, we will leave a message.  We will attempt to reach you two times.  During this call, we will ask if you have developed any symptoms of COVID 19. If you develop any symptoms (ie: fever, flu-like symptoms, shortness of breath, cough etc.) before then, please call [phone].  If you test positive for Covid 19 in the 2 weeks post procedure, please call and report this information to us.   ? ?If any biopsies were taken you will be contacted by phone or by letter within the next 1-3 weeks.  Please call us at [phone] if you have not heard about the biopsies in 3 weeks.  ? ? ?SIGNATURES/CONFIDENTIALITY: ?You and/or your care partner have signed paperwork which will be entered into your electronic medical record.    record.  These signatures attest to the fact that that the information above on your After Visit Summary has been reviewed and is understood.  Full responsibility of the confidentiality of this discharge information lies with you and/or your care-partner. 

## 2021-01-11 NOTE — Op Note (Signed)
[facility] Patient Name: Meghan Wilson Procedure Date: 01/11/2021 2:28 PM MRN: [id number] Endoscopist: Remo Lipps P. Havery Moros , MD Age: 60 Referring MD:  Date of Birth: [birth year] Gender: Female Account #: [id number] Procedure:                Upper GI endoscopy Indications:              Epigastric abdominal pain, history of nausea with                            vomiting, CT scan negative, history of                            cholecystectomy Medicines:                Monitored Anesthesia Care Procedure:                Pre-Anesthesia Assessment:                           - Prior to the procedure, a History and Physical                            was performed, and patient medications and                            allergies were reviewed. The patient's tolerance of                            previous anesthesia was also reviewed. The risks                            and benefits of the procedure and the sedation                            options and risks were discussed with the patient.                            All questions were answered, and informed consent                            was obtained. Prior Anticoagulants: The patient has                            taken no previous anticoagulant or antiplatelet                            agents. ASA Grade Assessment: II - A patient with                            mild systemic disease. After reviewing the risks                            and benefits, the patient was deemed in  satisfactory condition to undergo the procedure.                           After obtaining informed consent, the endoscope was                            passed under direct vision. Throughout the                            procedure, the patient's blood pressure, pulse, and                            oxygen saturations were monitored continuously. The                            Endoscope was introduced through the mouth,  and                            advanced to the second part of duodenum. The upper                            GI endoscopy was accomplished without difficulty.                            The patient tolerated the procedure well. Scope In: Scope Out: Findings:                 Esophagogastric landmarks were identified: the                            Z-line was found at 39 cm, the gastroesophageal                            junction was found at 39 cm and the upper extent of                            the gastric folds was found at 39 cm from the                            incisors.                           The exam of the esophagus was otherwise normal.                           The entire examined stomach was normal. Biopsies                            were taken with a cold forceps for Helicobacter                            pylori testing.                           Diffuse nodular mucosa  was found in the duodenal                            bulb. Biopsies were taken with a cold forceps for                            histology.                           The exam of the duodenum was otherwise normal. Complications:            No immediate complications. Estimated blood loss:                            Minimal. Estimated Blood Loss:     Estimated blood loss was minimal. Impression:               - Esophagogastric landmarks identified.                           - Normal esophagus otherwise                           - Normal stomach. Biopsied to rule out H pylori.                           - Nodular mucosa in the duodenal bulb. Biopsied.                           - Normal duodenum otherwise                           No overt cause for symptoms on this exam, await                            pathology results. Recommendation:           - Patient has a contact number available for                            emergencies. The signs and symptoms of potential                            delayed  complications were discussed with the                            patient. Return to normal activities tomorrow.                            Written discharge instructions were provided to the                            patient.                           - Resume previous diet.                           -  Continue present medications.                           - Await pathology results with further                            recommendations Remo Lipps P. [name], MD 01/11/2021 3:16:04 PM This report has been signed electronically.

## 2021-01-11 NOTE — Progress Notes (Signed)
Vitals by [place] 

## 2021-01-11 NOTE — Progress Notes (Signed)
1435 Robinul 0.1 mg IV given due large amount of secretions upon assessment.  MD made aware, vss 

## 2021-01-11 NOTE — Progress Notes (Signed)
Report given to PACU, vss 

## 2021-01-11 NOTE — Op Note (Signed)
[facility] Patient Name: Meghan Wilson Procedure Date: 01/11/2021 2:28 PM MRN: [id number] Endoscopist: Meghan Wilson , Meghan Wilson Age: 60 Referring Meghan Wilson:  Date of Birth: 1961-07-22 Gender: Female Account #: [id number] Procedure:                Colonoscopy Indications:              Episodic abdominal pain in the left lower quadrant,                            negative CT scan Medicines:                Monitored Anesthesia Care Procedure:                Pre-Anesthesia Assessment:                           - Prior to the procedure, a History and Physical                            was performed, and patient medications and                            allergies were reviewed. The patient's tolerance of                            previous anesthesia was also reviewed. The risks                            and benefits of the procedure and the sedation                            options and risks were discussed with the patient.                            All questions were answered, and informed consent                            was obtained. Prior Anticoagulants: The patient has                            taken no previous anticoagulant or antiplatelet                            agents. ASA Grade Assessment: II - A patient with                            mild systemic disease. After reviewing the risks                            and benefits, the patient was deemed in                            satisfactory condition to undergo the procedure.  After obtaining informed consent, the colonoscope                            was passed under direct vision. Throughout the                            procedure, the patient's blood pressure, pulse, and                            oxygen saturations were monitored continuously. The                            #[id number] was introduced through the anus and                            advanced to the the terminal ileum, with                             identification of the appendiceal orifice and IC                            valve. The colonoscopy was performed without                            difficulty. The patient tolerated the procedure                            well. The quality of the bowel preparation was                            adequate. The terminal ileum, ileocecal valve,                            appendiceal orifice, and rectum were photographed. Scope In: 2:48:44 PM Scope Out: 3:05:55 PM Scope Withdrawal Time: 0 hours 12 minutes 45 seconds  Total Procedure Duration: 0 hours 17 minutes 11 seconds  Findings:                 The perianal and digital rectal examinations were                            normal.                           The terminal ileum appeared normal.                           Many medium-mouthed diverticula were found in the                            entire colon (highest burden in ascending colon).                           A diminutive polyp was found in the recto-sigmoid  colon. The polyp was sessile. The polyp was removed                            with a cold snare. Resection and retrieval were                            complete.                           A 3 mm polyp was found in the rectum. The polyp was                            sessile. The polyp was removed with a cold snare.                            Resection and retrieval were complete.                           Internal hemorrhoids were found during retroflexion.                           The exam was otherwise without abnormality. Complications:            No immediate complications. Estimated blood loss:                            Minimal. Estimated Blood Loss:     Estimated blood loss was minimal. Impression:               - The examined portion of the ileum was normal.                           - Diverticulosis in the entire examined colon.                           - One  diminutive polyp at the recto-sigmoid colon,                            removed with a cold snare. Resected and retrieved.                           - One 3 mm polyp in the rectum, removed with a cold                            snare. Resected and retrieved.                           - Internal hemorrhoids.                           - The examination was otherwise normal.                           No clear cause for abdominal pain on this exam or  CT. Bowel spasm remains possible. Recommendation:           - Patient has a contact number available for                            emergencies. The signs and symptoms of potential                            delayed complications were discussed with the                            patient. Return to normal activities tomorrow.                            Written discharge instructions were provided to the                            patient.                           - Resume previous diet.                           - Continue present medications.                           - Await pathology results.                           - Will discuss further options with patient                            regarding abdominal pain evaluation / treatment if                            symptom persists Meghan Lipps P. Raphaela Cannaday, Meghan Wilson 01/11/2021 3:11:51 PM This report has been signed electronically.

## 2021-01-11 NOTE — Progress Notes (Signed)
Called to room to assist during endoscopic procedure.  Patient ID and intended procedure confirmed with present staff. Received instructions for my participation in the procedure from the performing physician.  

## 2021-01-15 ENCOUNTER — Telehealth: Payer: Self-pay | Admitting: *Deleted

## 2021-01-15 NOTE — Telephone Encounter (Signed)
  Follow up Call-  Call back number 01/11/2021  Post procedure Call Back phone  # [phone]  Permission to leave phone message Yes  Some recent data might be hidden     Patient questions:  Do you have a fever, pain , or abdominal swelling? No. Pain Score  0 *  Have you tolerated food without any problems? Yes.    Have you been able to return to your normal activities? Yes.    Do you have any questions about your discharge instructions: Diet   No. Medications  No. Follow up visit  No.  Do you have questions or concerns about your Care? No.  Actions: * If pain score is 4 or above: No action needed, pain <4.  1. Have you developed a fever since your procedure? no  2.   Have you had an respiratory symptoms (SOB or cough) since your procedure? no  3.   Have you tested positive for COVID 19 since your procedure no  4.   Have you had any family members/close contacts diagnosed with the COVID 19 since your procedure?  no   If yes to any of these questions please route to Joylene John, RN and Joella Prince, RN

## 2021-01-23 ENCOUNTER — Other Ambulatory Visit (HOSPITAL_COMMUNITY): Payer: Self-pay

## 2021-01-23 ENCOUNTER — Other Ambulatory Visit: Payer: Self-pay

## 2021-01-23 DIAGNOSIS — A048 Other specified bacterial intestinal infections: Secondary | ICD-10-CM

## 2021-01-23 MED ORDER — METRONIDAZOLE 500 MG PO TABS
500.0000 mg | ORAL_TABLET | Freq: Two times a day (BID) | ORAL | 0 refills | Status: AC
  Filled 2021-01-23: qty 28, 14d supply, fill #0

## 2021-01-23 MED ORDER — AMOXICILLIN 500 MG PO CAPS
1000.0000 mg | ORAL_CAPSULE | Freq: Two times a day (BID) | ORAL | 0 refills | Status: AC
  Filled 2021-01-23: qty 56, 14d supply, fill #0

## 2021-01-23 MED ORDER — CLARITHROMYCIN 500 MG PO TABS
500.0000 mg | ORAL_TABLET | Freq: Two times a day (BID) | ORAL | 0 refills | Status: AC
  Filled 2021-01-23: qty 28, 14d supply, fill #0

## 2021-01-30 ENCOUNTER — Other Ambulatory Visit (HOSPITAL_COMMUNITY): Payer: Self-pay

## 2021-01-30 MED FILL — Atorvastatin Calcium Tab 80 MG (Base Equivalent): ORAL | 30 days supply | Qty: 30 | Fill #1 | Status: AC

## 2021-02-08 ENCOUNTER — Other Ambulatory Visit (HOSPITAL_COMMUNITY): Payer: Self-pay

## 2021-02-14 ENCOUNTER — Encounter: Payer: Self-pay | Admitting: *Deleted

## 2021-02-15 ENCOUNTER — Ambulatory Visit (INDEPENDENT_AMBULATORY_CARE_PROVIDER_SITE_OTHER): Payer: Medicaid Other | Admitting: Obstetrics and Gynecology

## 2021-02-15 ENCOUNTER — Encounter: Payer: Self-pay | Admitting: Obstetrics and Gynecology

## 2021-02-15 ENCOUNTER — Other Ambulatory Visit (HOSPITAL_COMMUNITY): Payer: Self-pay

## 2021-02-15 ENCOUNTER — Other Ambulatory Visit: Payer: Self-pay

## 2021-02-15 VITALS — BP 119/76 | HR 98 | Ht 63.0 in | Wt 180.0 lb

## 2021-02-15 DIAGNOSIS — E669 Obesity, unspecified: Secondary | ICD-10-CM | POA: Diagnosis not present

## 2021-02-15 DIAGNOSIS — Z01419 Encounter for gynecological examination (general) (routine) without abnormal findings: Secondary | ICD-10-CM

## 2021-02-15 MED ORDER — ESTRADIOL 0.1 MG/GM VA CREA
1.0000 | TOPICAL_CREAM | Freq: Every day | VAGINAL | 12 refills | Status: DC
  Filled 2021-02-15 (×2): qty 42.5, 28d supply, fill #0

## 2021-02-15 NOTE — Progress Notes (Signed)
Subjective:     Meghan Wilson is a 60 y.o. female P2 with BMI 31 postmenopausal presenting for a comprehensive physical exam. The patient reports some vaginal dryness and dyspareunia. She is doing well without any other complaints. She denies urinary incontinence. She reports a history of TAH for AUB. Patient also reports an inability to lose weight despite a change in diet and exercise regimen.   Past Medical History:  Diagnosis Date   Anxiety    Arthritis    Bipolar depression ([place]) 09/2015   Dizziness    Elevated blood pressure, situational    GERD (gastroesophageal reflux disease)    History of colon polyps    History of patellar fracture 2016   History of tibial fracture 2016   Hyperlipidemia    Night terrors    Night terrors, adult 09/2015   Sleep apnea    doesnt wear c-pap   Past Surgical History:  Procedure Laterality Date   ABDOMINAL HYSTERECTOMY     CHOLECYSTECTOMY     CLOSED REDUCTION TIBIAL FRACTURE Right 2003   COLONOSCOPY  2016   [place]  2022    Family History  Problem Relation Age of Onset   Hyperlipidemia Mother    Hypertension Mother    Colon polyps Mother    Heart disease Mother    Arthritis/Rheumatoid Father    Hyperlipidemia Maternal Aunt    Hypertension Maternal Aunt    Cancer Maternal Aunt    Colon polyps Maternal Grandmother    Colon cancer Neg Hx    Esophageal cancer Neg Hx    Stomach cancer Neg Hx    Rectal cancer Neg Hx      Social History   Socioeconomic History   Marital status: Single    Spouse name: Not on file   Number of children: 2   Years of education: 2 years of college   Highest education level: Not on file  Occupational History   Not on file  Tobacco Use   Smoking status: Former    Packs/day: 0.25    Years: 4.00    Pack years: 1.00    Types: Cigarettes    Quit date: 12/21/2017    Years since quitting: 3.1   Smokeless tobacco: Never  Vaping Use   Vaping Use: Never used  Substance and  Sexual Activity   Alcohol use: Yes    Comment: special occasions   Drug use: No   Sexual activity: Yes    Birth control/protection: None    Comment: Celibate  Other Topics Concern   Not on file  Social History Narrative   Lives at home with her fiance.   Right-handed.   No daily use of caffeine.   Social Determinants of Health   Financial Resource Strain: Not on file  Food Insecurity: Not on file  Transportation Needs: Not on file  Physical Activity: Not on file  Stress: Not on file  Social Connections: Not on file  Intimate Partner Violence: Not on file   Health Maintenance  Topic Date Due   PNEUMOCOCCAL POLYSACCHARIDE VACCINE AGE 26-64 HIGH RISK  Never done   PAP SMEAR-Modifier  12/15/1980   Zoster Vaccines- Shingrix (1 of 2) Never done   COVID-19 Vaccine (4 - Booster for Pfizer series) 12/29/2020   INFLUENZA VACCINE  04/01/2021   FOOT EXAM  04/11/2021   HEMOGLOBIN A1C  04/11/2021   OPHTHALMOLOGY EXAM  06/20/2021   MAMMOGRAM  05/04/2022   COLONOSCOPY (Pts 45-64yrs Insurance coverage  will need to be confirmed)  01/12/2028   TETANUS/TDAP  09/19/2030   Hepatitis C Screening  Completed   HIV Screening  Completed   Pneumococcal Vaccine 31-11 Years old  Aged Out   HPV VACCINES  Aged Out       Review of Systems Pertinent items noted in HPI and remainder of comprehensive ROS otherwise negative.   Objective:  Blood pressure 119/76, pulse 98, height 5\' 3"  (1.6 m), weight 180 lb (81.6 kg).   GENERAL: Well-developed, well-nourished female in no acute distress.  HEENT: Normocephalic, atraumatic. Sclerae anicteric.  NECK: Supple. Normal thyroid.  LUNGS: Clear to auscultation bilaterally.  HEART: Regular rate and rhythm. BREASTS: Symmetric in size. No palpable masses or lymphadenopathy, skin changes, or nipple drainage. ABDOMEN: Soft, nontender, nondistended. No organomegaly. PELVIC: Normal external female genitalia. Vagina is pale and atrophic.  Normal discharge. Normal  appearing vaginal cuff.  No adnexal mass or tenderness. EXTREMITIES: No cyanosis, clubbing, or edema, 2+ distal pulses.    Assessment:    Healthy female exam.      Plan:    Vaginal atrophy Rx estrace cream provided Patient referred to nutritionist to assist with weight loss Patient with normal mammogram 05/2020 Patient reports having a colonoscopy RTC in 1 year or prn See After Visit Summary for Counseling Recommendations

## 2021-02-15 NOTE — Progress Notes (Signed)
NEW GYN presents for AEX.  C/o vaginal dryness, weight gain.  Had Hysterectomy in 1988

## 2021-02-20 ENCOUNTER — Other Ambulatory Visit (HOSPITAL_COMMUNITY): Payer: Self-pay

## 2021-02-22 ENCOUNTER — Telehealth: Payer: Self-pay

## 2021-02-22 ENCOUNTER — Other Ambulatory Visit: Payer: Self-pay | Admitting: Obstetrics & Gynecology

## 2021-02-22 ENCOUNTER — Other Ambulatory Visit (HOSPITAL_COMMUNITY): Payer: Self-pay

## 2021-02-22 DIAGNOSIS — N952 Postmenopausal atrophic vaginitis: Secondary | ICD-10-CM

## 2021-02-22 MED ORDER — PREMARIN 0.625 MG/GM VA CREA
1.0000 | TOPICAL_CREAM | Freq: Every day | VAGINAL | 12 refills | Status: DC
  Filled 2021-02-22: qty 30, 20d supply, fill #0

## 2021-02-22 NOTE — Telephone Encounter (Signed)
Lm on vm for patient to return call 

## 2021-02-22 NOTE — Progress Notes (Signed)
Patient was prescribed estrace cream for vulvovaginal atrophy; but she requested Premarin due to insurance formulary preference. This was prescribed instead for patient.   Verita Schneiders, MD

## 2021-02-22 NOTE — Telephone Encounter (Signed)
-----   Message from Yevette Edwards, RN sent at 01/23/2021  2:13 PM EDT ----- Regarding: Hold Pantoprazole Hold Protonix, due for H. Pylori stool antigen on or after 7/8

## 2021-02-25 NOTE — Telephone Encounter (Signed)
Spoke with patient to remind her to hold her Pantoprazole at this time. Patient will begin holding medication tomorrow, she is aware that we will contact her when she is due for the stool study. Patient verbalized understanding and had no concerns at the end of the call.

## 2021-02-26 ENCOUNTER — Other Ambulatory Visit (HOSPITAL_COMMUNITY): Payer: Self-pay

## 2021-03-05 ENCOUNTER — Encounter: Payer: Self-pay | Admitting: *Deleted

## 2021-03-06 ENCOUNTER — Other Ambulatory Visit (HOSPITAL_COMMUNITY): Payer: Self-pay

## 2021-03-06 ENCOUNTER — Encounter: Payer: Self-pay | Admitting: Internal Medicine

## 2021-03-06 ENCOUNTER — Ambulatory Visit (INDEPENDENT_AMBULATORY_CARE_PROVIDER_SITE_OTHER): Payer: Medicaid Other | Admitting: Internal Medicine

## 2021-03-06 ENCOUNTER — Other Ambulatory Visit: Payer: Self-pay

## 2021-03-06 ENCOUNTER — Other Ambulatory Visit: Payer: Self-pay | Admitting: Student

## 2021-03-06 VITALS — BP 130/79 | HR 94 | Temp 98.2°F | Ht 63.0 in | Wt 180.7 lb

## 2021-03-06 DIAGNOSIS — E119 Type 2 diabetes mellitus without complications: Secondary | ICD-10-CM

## 2021-03-06 DIAGNOSIS — K2970 Gastritis, unspecified, without bleeding: Secondary | ICD-10-CM

## 2021-03-06 DIAGNOSIS — N952 Postmenopausal atrophic vaginitis: Secondary | ICD-10-CM

## 2021-03-06 DIAGNOSIS — S0031XA Abrasion of nose, initial encounter: Secondary | ICD-10-CM | POA: Insufficient documentation

## 2021-03-06 DIAGNOSIS — E785 Hyperlipidemia, unspecified: Secondary | ICD-10-CM | POA: Diagnosis not present

## 2021-03-06 DIAGNOSIS — F3131 Bipolar disorder, current episode depressed, mild: Secondary | ICD-10-CM

## 2021-03-06 DIAGNOSIS — R1010 Upper abdominal pain, unspecified: Secondary | ICD-10-CM

## 2021-03-06 DIAGNOSIS — M47816 Spondylosis without myelopathy or radiculopathy, lumbar region: Secondary | ICD-10-CM

## 2021-03-06 DIAGNOSIS — M25561 Pain in right knee: Secondary | ICD-10-CM | POA: Diagnosis not present

## 2021-03-06 DIAGNOSIS — J302 Other seasonal allergic rhinitis: Secondary | ICD-10-CM | POA: Diagnosis not present

## 2021-03-06 MED ORDER — HYDROCODONE-ACETAMINOPHEN 10-325 MG PO TABS
1.0000 | ORAL_TABLET | Freq: Every day | ORAL | 0 refills | Status: DC | PRN
  Filled 2021-03-06: qty 30, 30d supply, fill #0

## 2021-03-06 MED ORDER — HYDROCODONE-ACETAMINOPHEN 10-300 MG PO TABS
1.0000 | ORAL_TABLET | Freq: Every day | ORAL | 0 refills | Status: DC | PRN
  Filled 2021-03-06: qty 30, 30d supply, fill #0

## 2021-03-06 NOTE — Progress Notes (Signed)
CC: "bump in my nose"  HPI:  Meghan Wilson is a 60 y.o. with medical history as below presenting to [facility] for a bump in her nose and right knee pain.   Please see problem-based list for further details, assessments, and plans.  Past Medical History:  Diagnosis Date   Anxiety    Arthritis    Bipolar depression ([place]) 09/2015   Dizziness    Elevated blood pressure, situational    GERD (gastroesophageal reflux disease)    History of colon polyps    History of patellar fracture 2016   History of tibial fracture 2016   Hyperlipidemia    Night terrors    Night terrors, adult 09/2015   Sleep apnea    doesnt wear c-pap   Review of Systems:   Review of Systems  Constitutional:  Negative for chills and fever.  HENT:  Negative for hearing loss and tinnitus.   Eyes:  Negative for blurred vision and double vision.  Respiratory:  Negative for cough and hemoptysis.   Cardiovascular:  Negative for chest pain and palpitations.  Gastrointestinal:  Positive for heartburn. Negative for nausea and vomiting.  Genitourinary:  Negative for dysuria and urgency.  Musculoskeletal:  Positive for joint pain (right knee pain). Negative for myalgias and neck pain.  Skin:  Negative for itching and rash.  Neurological:  Negative for dizziness and headaches.  Endo/Heme/Allergies:  Negative for environmental allergies. Does not bruise/bleed easily.  Psychiatric/Behavioral:  Negative for depression and suicidal ideas.     Physical Exam:  Vitals:   03/06/21 1507  BP: 130/79  Pulse: 94  Temp: 98.2 F (36.8 C)  TempSrc: Oral  SpO2: 100%  Weight: 180 lb 11.2 oz (82 kg)  Height: 5\' 3"  (1.6 m)   Physical Exam Constitutional:      General: She is not in acute distress.    Appearance: Normal appearance. She is not ill-appearing.  HENT:     Head: Normocephalic and atraumatic.     Right Ear: External ear normal.     Left Ear: External ear normal.     Nose: Signs of injury and nasal tenderness (in  the left nostril) present.     Mouth/Throat:     Mouth: Mucous membranes are moist.     Pharynx: Oropharynx is clear.  Eyes:     Extraocular Movements: Extraocular movements intact.     Conjunctiva/sclera: Conjunctivae normal.     Pupils: Pupils are equal, round, and reactive to light.  Cardiovascular:     Rate and Rhythm: Normal rate and regular rhythm.     Pulses: Normal pulses.     Heart sounds: Normal heart sounds. No murmur heard.   No friction rub. No gallop.  Pulmonary:     Effort: Pulmonary effort is normal. No respiratory distress.     Breath sounds: Normal breath sounds. No stridor. No wheezing or rhonchi.  Abdominal:     General: Bowel sounds are normal. There is no distension.     Palpations: Abdomen is soft. There is no mass.     Tenderness: There is abdominal tenderness (epigastric area).  Musculoskeletal:        General: Swelling (right knee mildly swollen), tenderness (right knee along the joint line) and signs of injury present.     Cervical back: Normal range of motion and neck supple.  Skin:    Capillary Refill: Capillary refill takes less than 2 seconds.     Coloration: Skin is not jaundiced.  Findings: Erythema (skin over right knee) present. No lesion or rash.  Neurological:     General: No focal deficit present.     Mental Status: She is alert and oriented to person, place, and time. Mental status is at baseline.  Psychiatric:        Mood and Affect: Mood normal.        Behavior: Behavior normal.     Assessment & Plan:   See Encounters Tab for problem based charting.  Patient seen with Dr.  Letha Cape, MD

## 2021-03-06 NOTE — Assessment & Plan Note (Signed)
Assessment: Patient had vaginal dryness that responded to Premarin cream. She requested the cream after she ran out of it but later decided not to use it due to the side effects listed. The patient wanted a safer alternative to Premarin.  Plan:  -Recommended patient use Replens

## 2021-03-06 NOTE — Assessment & Plan Note (Addendum)
Assessment:  Patient has hyperlipidemia controlled with atorvastatin 80 mg qd.  Plan: -Continue current regimen of atorvastatin 80 mg qd -Discussed healthy lifestyle modifications such as balanced diet and exercise  -Repeat lipid panel on next visit

## 2021-03-06 NOTE — Assessment & Plan Note (Signed)
Assessment: Patient has upper abdominal pain that has been present for long term. Currently being treated for H. Pylori infection. Will see if pain persists after H. Pylori eradicated. Patient follows GI.  Plan: -Continue to follow GI -Give stool sample to test for H. Pylori -Advised to take Align and fiber tablets.

## 2021-03-06 NOTE — Assessment & Plan Note (Signed)
Assessment: Patient has seasonal allergies and takes generic Claritin and Flonase for her allergies.   Plan: -Continue both Flonase and Claritin

## 2021-03-06 NOTE — Assessment & Plan Note (Signed)
Assessment: Patient has bipolar disorder well controlled Lamotrigine 1000 mg.  Plan: -Continue taking Lamotrigine 1000 mg daily.

## 2021-03-06 NOTE — Assessment & Plan Note (Signed)
Assessment: Patient has long standing abdominal discomfort. She has had multiple workups involving endoscopy and colonoscopy. In her most recent endoscopy, H. Pylori was discovered and the patient underwent appropriate therapy. She is due to have a test of cure on 03/08/21.  Plan: -Follow GI recommendations -Complete stool testing to check for bacterial presence. -Advised patient to take fiber tablets instead of the powder. -Advised patient on taking Align to increase good bacteria in her gut.

## 2021-03-06 NOTE — Assessment & Plan Note (Signed)
Assessment:  Patient has right knee pain that persists still today. The patient stated she wants to get knee replacement surgery but is waiting for short term work disability before undergoing the procedure. The knee had swelling and tenderness along the joint line.  Plan: -Refilled patients percocet -Advised patient to use Voltaren Gel  -Stop if you develop rash or redness at application site.

## 2021-03-06 NOTE — Patient Instructions (Addendum)
[name], it was a pleasure seeing you today!  Today we discussed: Nose Bump-The left nostril is irritated which may be due to picking as endorsed by the patient. Recommend no irritating the nostril and applying OTC hydrocortisone cream in the right nostril.  Right Knee Pain- Patient has chronic right knee pain and is working to have the knee replaced. Refilled her Percocet 10mg -325mg  that she takes as needed. Also advised to use Voltaren gel over the counter as stated on the packaging.  Abdominal Discomfort-Advised following up with GI. Complete the stool test that checks for cure of the H. Pylori infection. Also advised using fiber tablets instead of the powder for easier dosing.  Vaginal Dryness- Use Replens OTC for vaginal dryness instead of the Premarin.  I have ordered the following labs today:  Lab Orders  No laboratory test(s) ordered today     Tests ordered today:  None  Referrals ordered today:   Referral Orders  No referral(s) requested today     I have ordered the following medication/changed the following medications:   Stop the following medications: There are no discontinued medications.   Start the following medications: -Hydrocortisone Cream- OTC for nasal irritation -Replense OTC- For vaginal Dryness  -Fiber tablets-OTC for abdominal discomfort -Voltaren Gel-OTC for knee pain   Follow-up: 3-4 months   Please make sure to arrive 15 minutes prior to your next appointment. If you arrive late, you may be asked to reschedule.   We look forward to seeing you next time. Please call our clinic at [phone] if you have any questions or concerns. The best time to call is Monday-Friday from 9am-4pm, but there is someone available 24/7. If after hours or the weekend, call the main hospital number and ask for the Internal Medicine Resident On-Call. If you need medication refills, please notify your pharmacy one week in advance and they will send [place] a  request.  Thank you for letting us take part in your care. Wishing you the best!  Thank you, Idamae Schuller, MD

## 2021-03-06 NOTE — Assessment & Plan Note (Signed)
Assessment: Patient has T2DM that is well controlled with metformin 500 mg qd. A1c on 10/12/20 was 5.9.  Plan: -Continue metformin 500 mg qd

## 2021-03-06 NOTE — Assessment & Plan Note (Signed)
Assessment: Patient had nasal bump for 3 months. DDx include nasal abrasion 2/2 to trauma vs nasal polyps vs nasal septal deviation vs nasal polyps. Most likely seems 2/2 to trauma as patients endorses repeatedly picking her nose. After performing physical exam, other causes are less likely as no septal deviation was noted and no polyps were present.   Plan: -Advised patient to use hydrocortisone cream in the left nostril -Asked patient to avoid irritating her nostril as patient admitted to removing scabs that form in the left nostril.  -ENT referral if no improvement in 2 weeks.

## 2021-03-07 ENCOUNTER — Other Ambulatory Visit (HOSPITAL_COMMUNITY): Payer: Self-pay

## 2021-03-07 MED ORDER — HYDROCODONE-ACETAMINOPHEN 10-325 MG PO TABS
1.0000 | ORAL_TABLET | Freq: Every day | ORAL | 0 refills | Status: AC | PRN
  Filled 2021-03-07 – 2021-03-12 (×4): qty 30, 30d supply, fill #0

## 2021-03-07 NOTE — Addendum Note (Signed)
Addended by: Gilles Chiquito B on: 03/07/2021 09:16 AM   Modules accepted: Orders

## 2021-03-08 ENCOUNTER — Telehealth: Payer: Self-pay | Admitting: *Deleted

## 2021-03-08 ENCOUNTER — Other Ambulatory Visit (HOSPITAL_COMMUNITY): Payer: Self-pay

## 2021-03-08 ENCOUNTER — Telehealth: Payer: Self-pay

## 2021-03-08 MED FILL — Metformin HCl Tab ER 24HR 500 MG: ORAL | 90 days supply | Qty: 90 | Fill #0 | Status: AC

## 2021-03-08 NOTE — Telephone Encounter (Signed)
Information was sent through [employer] for PA for Hydrocodone Acetaminophen.  Awaiting determination.  Sander Nephew, RN 03/08/2021 9:40 AM.  PA for Hydrocodone Acetaminophen was approved 03/08/2021  thru 06/06/2021.  [place] [id number].  I [id number]  Sander Nephew, RN 03/11/2021 3:06 PM.

## 2021-03-08 NOTE — Telephone Encounter (Signed)
Rx for hydrocodone was written by an Attending, not Dr. Humphrey Rolls. Spoke with Trish at [facility]. States this med needs a PA and she has sent the request through Cover My Meds. Will forward to PA staff. Left detailed message on patient's self-identified VM with this information.

## 2021-03-08 NOTE — Telephone Encounter (Signed)
Per patient the pharmacy is requesting another doctor to write the Rx. Dr. Humphrey Rolls name will not go through with Medicaid. Pt would like a call back.

## 2021-03-11 ENCOUNTER — Telehealth: Payer: Self-pay

## 2021-03-11 NOTE — Telephone Encounter (Signed)
-----   Message from Yevette Edwards, RN sent at 03/06/2021  4:25 PM EDT ----- Regarding: FW: H. pylori  ----- Message ----- From: Yevette Edwards, RN Sent: 03/06/2021  12:00 AM EDT To: Yevette Edwards, RN Subject: H. pylori                                      H. Pylori stool antigen due on or after 7/8, order in epic.

## 2021-03-11 NOTE — Telephone Encounter (Signed)
Spoke with patient to remind her that she is due for H. Pylori stool test at this time. No appointment is necessary. Patient is aware that she can stop by the lab in the basement at her convenience between 7:30 AM - 5 PM, Monday through Friday to pick up the stool kit. Patient verbalized understanding and had no concerns at the end of the call.   

## 2021-03-12 ENCOUNTER — Other Ambulatory Visit (HOSPITAL_COMMUNITY): Payer: Self-pay

## 2021-03-21 ENCOUNTER — Other Ambulatory Visit (HOSPITAL_COMMUNITY): Payer: Self-pay

## 2021-03-21 MED FILL — Pantoprazole Sodium EC Tab 40 MG (Base Equiv): ORAL | 30 days supply | Qty: 60 | Fill #1 | Status: AC

## 2021-03-21 MED FILL — Atorvastatin Calcium Tab 80 MG (Base Equivalent): ORAL | 30 days supply | Qty: 30 | Fill #2 | Status: AC

## 2021-03-22 ENCOUNTER — Other Ambulatory Visit: Payer: Medicaid Other

## 2021-03-22 DIAGNOSIS — A048 Other specified bacterial intestinal infections: Secondary | ICD-10-CM

## 2021-03-24 LAB — H. PYLORI ANTIGEN, STOOL: H pylori Ag, Stl: NEGATIVE

## 2021-03-26 ENCOUNTER — Other Ambulatory Visit (HOSPITAL_COMMUNITY): Payer: Self-pay

## 2021-04-02 ENCOUNTER — Other Ambulatory Visit: Payer: Self-pay | Admitting: Internal Medicine

## 2021-04-02 DIAGNOSIS — Z1231 Encounter for screening mammogram for malignant neoplasm of breast: Secondary | ICD-10-CM

## 2021-04-05 ENCOUNTER — Ambulatory Visit: Payer: Medicaid Other | Admitting: Registered"

## 2021-04-29 ENCOUNTER — Emergency Department (HOSPITAL_COMMUNITY)
Admission: EM | Admit: 2021-04-29 | Discharge: 2021-04-29 | Disposition: A | Discharge status: Home / Self Care | Payer: Medicaid Other | Attending: Emergency Medicine | Admitting: Emergency Medicine

## 2021-04-29 ENCOUNTER — Encounter (HOSPITAL_COMMUNITY): Payer: Self-pay

## 2021-04-29 ENCOUNTER — Other Ambulatory Visit: Payer: Self-pay

## 2021-04-29 ENCOUNTER — Emergency Department (HOSPITAL_COMMUNITY): Payer: Medicaid Other

## 2021-04-29 ENCOUNTER — Other Ambulatory Visit (HOSPITAL_COMMUNITY): Payer: Self-pay

## 2021-04-29 DIAGNOSIS — Z91040 Latex allergy status: Secondary | ICD-10-CM | POA: Diagnosis not present

## 2021-04-29 DIAGNOSIS — E119 Type 2 diabetes mellitus without complications: Secondary | ICD-10-CM | POA: Diagnosis not present

## 2021-04-29 DIAGNOSIS — Z7984 Long term (current) use of oral hypoglycemic drugs: Secondary | ICD-10-CM | POA: Diagnosis not present

## 2021-04-29 DIAGNOSIS — N3001 Acute cystitis with hematuria: Secondary | ICD-10-CM | POA: Insufficient documentation

## 2021-04-29 DIAGNOSIS — R319 Hematuria, unspecified: Secondary | ICD-10-CM | POA: Diagnosis present

## 2021-04-29 DIAGNOSIS — Z87891 Personal history of nicotine dependence: Secondary | ICD-10-CM | POA: Insufficient documentation

## 2021-04-29 LAB — COMPREHENSIVE METABOLIC PANEL
ALT: 23 U/L (ref 0–44)
AST: 25 U/L (ref 15–41)
Albumin: 4 g/dL (ref 3.5–5.0)
Alkaline Phosphatase: 67 U/L (ref 38–126)
Anion gap: 7 (ref 5–15)
BUN: 11 mg/dL (ref 8–23)
CO2: 27 mmol/L (ref 22–32)
Calcium: 9.7 mg/dL (ref 8.9–10.3)
Chloride: 107 mmol/L (ref 98–111)
Creatinine, Ser: 0.64 mg/dL (ref 0.44–1.00)
GFR, Estimated: >60 mL/min (ref >60)
Glucose, Bld: 144 mg/dL — ABNORMAL HIGH (ref 70–99)
Potassium: 4.4 mmol/L (ref 3.5–5.1)
Sodium: 141 mmol/L (ref 135–145)
Total Bilirubin: 0.6 mg/dL (ref 0.3–1.2)
Total Protein: 7.6 g/dL (ref 6.5–8.1)

## 2021-04-29 LAB — CBC WITH DIFFERENTIAL/PLATELET
Abs Immature Granulocytes: 0.06 10*3/uL (ref 0.00–0.07)
Basophils Absolute: 0.1 10*3/uL (ref 0.0–0.1)
Basophils Relative: 1 %
Eosinophils Absolute: 0.1 10*3/uL (ref 0.0–0.5)
Eosinophils Relative: 1 %
HCT: 41 % (ref 36.0–46.0)
Hemoglobin: 12.7 g/dL (ref 12.0–15.0)
Immature Granulocytes: 1 %
Lymphocytes Relative: 25 %
Lymphs Abs: 2.6 10*3/uL (ref 0.7–4.0)
MCH: 26.9 pg (ref 26.0–34.0)
MCHC: 31 g/dL (ref 30.0–36.0)
MCV: 86.9 fL (ref 80.0–100.0)
Monocytes Absolute: 1 10*3/uL (ref 0.1–1.0)
Monocytes Relative: 10 %
Neutro Abs: 6.6 10*3/uL (ref 1.7–7.7)
Neutrophils Relative %: 62 %
Platelets: 360 10*3/uL (ref 150–400)
RBC: 4.72 MIL/uL (ref 3.87–5.11)
RDW: 14.3 % (ref 11.5–15.5)
WBC: 10.5 10*3/uL (ref 4.0–10.5)
nRBC: 0 % (ref 0.0–0.2)

## 2021-04-29 LAB — URINALYSIS, ROUTINE W REFLEX MICROSCOPIC
Bilirubin Urine: NEGATIVE
Glucose, UA: NEGATIVE mg/dL
Ketones, ur: NEGATIVE mg/dL
Nitrite: NEGATIVE
Protein, ur: 100 mg/dL — AB
RBC / HPF: >50 RBC/hpf — ABNORMAL HIGH (ref 0–5)
Specific Gravity, Urine: 1.01 (ref 1.005–1.030)
WBC, UA: >50 WBC/hpf — ABNORMAL HIGH (ref 0–5)
pH: 7 (ref 5.0–8.0)

## 2021-04-29 MED ORDER — CEPHALEXIN 500 MG PO CAPS
500.0000 mg | ORAL_CAPSULE | Freq: Two times a day (BID) | ORAL | 0 refills | Status: AC
  Filled 2021-04-29: qty 14, 7d supply, fill #0

## 2021-04-29 NOTE — ED Provider Notes (Signed)
[facility] DEPT Provider Note   CSN: [id number] Arrival date & time: 04/29/21  [id number]     History Chief Complaint  Patient presents with   Abdominal Pain   Hematuria    Meghan Wilson is a 60 y.o. female.  60 y.o female with a PMH of Bipolar depression, Hyperlipidemia, Anxiety presents to the ED with a chief complaint of abdominal pain and dysuria.  Patient reports her symptoms have been going for the past 4 days.  Describing it as pain to her left lower abdomen along with radiation into the left flank.  Does have a prior history of an appendectomy, feels that most of her pain also radiates along her umbilicus.  She recently had an endoscopy along with a colonoscopy by Dr. Molli Knock at [facility], was treated for H. Pylori and states she completed the antibiotic regimen without any mild improvement in his symptoms.  Scribes the pain on her stomach as "woozy in my stomach ", reports since the past 4 days she has been experiencing urinary frequency, dysuria and worsening hematuria.  Seen " out of pink blood ".  She has been taken hydrocodone, without much improvement in her symptoms.  There has been some nausea but no vomiting.  No fever, Hartness of breath, chest pain.  The history is provided by the patient and medical records.  Abdominal Pain Pain location:  L flank Pain quality: aching and shooting   Pain radiates to:  Periumbilical region and L flank Pain severity:  Moderate Onset quality:  Gradual Duration:  4 days Timing:  Constant Progression:  Waxing and waning Chronicity:  New Relieved by:  Nothing Worsened by:  Nothing Associated symptoms: hematuria and nausea   Associated symptoms: no chest pain, no chills, no dysuria, no fever, no shortness of breath and no vomiting   Hematuria Associated symptoms include abdominal pain. Pertinent negatives include no chest pain, no headaches and no shortness of breath.      Past Medical History:   Diagnosis Date   Anxiety    Arthritis    Bipolar depression ([place]) 09/2015   Dizziness    Elevated blood pressure, situational    GERD (gastroesophageal reflux disease)    History of colon polyps    History of patellar fracture 2016   History of tibial fracture 2016   Hyperlipidemia    Night terrors    Night terrors, adult 09/2015   Post concussion syndrome 12/20/2019   Sleep apnea    doesnt wear c-pap    Patient Active Problem List   Diagnosis Date Noted   Nasal abrasion 03/06/2021   Upper abdominal pain 11/10/2020   Seasonal allergies 11/09/2020   Vaginal atrophy 11/09/2020   Type II diabetes mellitus ([place]) 02/16/2019   Pain of right knee after injury 03/10/2018   Healthcare maintenance 01/02/2018   Hyperlipidemia 01/01/2018   Peptic gastritis 01/01/2018   Bipolar disorder ([place]) 08/03/2015    Past Surgical History:  Procedure Laterality Date   ABDOMINAL HYSTERECTOMY     CHOLECYSTECTOMY     CLOSED REDUCTION TIBIAL FRACTURE Right 2003   COLONOSCOPY  2016   [place]  2022     OB History     Gravida  3   Para  2   Term  2   Preterm      AB  1   Living  2      SAB  1   IAB  Ectopic      Multiple      Live Births  2           Family History  Problem Relation Age of Onset   Hyperlipidemia Mother    Hypertension Mother    Colon polyps Mother    Heart disease Mother    Arthritis/Rheumatoid Father    Hyperlipidemia Maternal Aunt    Hypertension Maternal Aunt    Cancer Maternal Aunt    Colon polyps Maternal Grandmother    Colon cancer Neg Hx    Esophageal cancer Neg Hx    Stomach cancer Neg Hx    Rectal cancer Neg Hx     Social History   Tobacco Use   Smoking status: Former    Packs/day: 0.25    Years: 4.00    Pack years: 1.00    Types: Cigarettes    Quit date: 12/21/2017    Years since quitting: 3.3   Smokeless tobacco: Never  Vaping Use   Vaping Use: Never used  Substance Use Topics   Alcohol  use: Yes    Comment: special occasions   Drug use: No    Home Medications Prior to Admission medications   Medication Sig Start Date End Date Taking? Authorizing Provider  cephALEXin (KEFLEX) 500 MG capsule Take 1 capsule (500 mg total) by mouth 2 (two) times daily for 7 days. 04/29/21 05/06/21 Yes Saidi Santacroce, Beverley Fiedler, PA-C  Accu-Chek FastClix Lancets MISC USE 1 TO CHECK BLOOD SUGAR UP TO 7 TIMES A WEEK. 09/19/20 09/19/21  Marianna Payment, MD  acetaminophen (TYLENOL) 325 MG tablet TAKE 1 TO 2 TABLETS EVERY 6 HOURS AS NEEDED. 11/14/20 11/14/21  Hortencia Pilar, DMD  atorvastatin (LIPITOR) 80 MG tablet TAKE 1 TABLET (80 MG TOTAL) BY MOUTH DAILY. 09/03/20 09/03/21  Marianna Payment, MD  calcium-vitamin D (OSCAL WITH D) 500-200 MG-UNIT tablet Take 1 tablet by mouth.    [provider]  chlorhexidine (PERIDEX) 0.12 % solution RINSE MOUTH WITH 15ML (1 CAPFUL) FOR 30 SECONDS AM AND PM AFTER TOOTHBRUSHING. EXPECTORATE AFTER RINSING, DO NOT SWALLOW 11/14/20 11/14/21  Hortencia Pilar, DMD  conjugated estrogens (PREMARIN) vaginal cream Place 1 applicatorful vaginally at bedtime for two weeks, then use three times a week as directed 02/22/21   Anyanwu, Sallyanne Havers, MD  dicyclomine (BENTYL) 10 MG capsule TAKE 1 CAPSULE BY MOUTH THREE TIMES DAILY AS NEEDED FOR SPASMS. Patient not taking: No sig reported 11/09/20 11/09/21  Noralyn Pick, NP  FIBER ADULT GUMMIES PO Take by mouth.    [provider]  fluticasone (FLONASE) 50 MCG/ACT nasal spray PLACE 1 SPRAY INTO BOTH NOSTRILS DAILY AS NEEDED FOR ALLERGIES OR RHINITIS. 11/09/20 11/09/21  Seawell, Jaimie A, DO  glucose blood test strip USE TO CHECK BLOOD GLUCOSE UP TO 7 TIMES A WEEK. Patient taking differently: USE TO CHECK BLOOD GLUCOSE UP TO 7 TIMES A WEEK. 09/19/20 09/19/21  Marianna Payment, MD  lamoTRIgine (LAMICTAL) 100 MG tablet TAKE 1 TABLET BY MOUTH ONCE A DAY 03/30/20 03/30/21    lamoTRIgine (LAMICTAL) 100 MG tablet Take 1 tablet (100 mg total) by mouth  daily. 01/04/21     lidocaine (LIDODERM) 5 % Place 1 patch onto the skin daily. May wear up to 12 hours (12 hours on and 12 hours off) Patient not taking: Reported on 01/11/2021 12/10/20     metFORMIN (GLUCOPHAGE-XR) 500 MG 24 hr tablet TAKE 1 TABLET (500 MG TOTAL) BY MOUTH DAILY WITH BREAKFAST. 10/12/20 10/12/21  Cato Mulligan, MD  ondansetron (  ZOFRAN-ODT) 4 MG disintegrating tablet DISSOLVE 1 TABLET UNDER THE TONGUE EVERY 8 HOURS AS NEEDED FOR NAUSEA AND VOMITING. 11/09/20 11/09/21  Noralyn Pick, NP  pantoprazole (PROTONIX) 40 MG tablet TAKE 1 TABLET (40 MG TOTAL) BY MOUTH 2 (TWO) TIMES DAILY BEFORE A MEAL. 10/12/20   Cato Mulligan, MD  prazosin (MINIPRESS) 1 MG capsule TAKE 1 CAPSULE BY MOUTH AT BEDTIME 03/30/20 03/30/21    prazosin (MINIPRESS) 1 MG capsule Take 2 capsules (2 mg total) by mouth at bedtime. 01/04/21       Allergies    Depakote [divalproex sodium], Ibuprofen, Prednisone, and Latex  Review of Systems   Review of Systems  Constitutional:  Negative for chills and fever.  HENT:  Negative for sinus pressure.   Respiratory:  Negative for shortness of breath.   Cardiovascular:  Negative for chest pain.  Gastrointestinal:  Positive for abdominal pain and nausea. Negative for vomiting.  Genitourinary:  Positive for flank pain and hematuria. Negative for dysuria.  Musculoskeletal:  Negative for back pain.  Neurological:  Negative for light-headedness and headaches.  All other systems reviewed and are negative.  Physical Exam Updated Vital Signs BP 107/64   Pulse 98   Temp 98.4 F (36.9 C) (Oral)   Resp 19   SpO2 99%   Physical Exam Vitals and nursing note reviewed.  Constitutional:      Appearance: She is well-developed. She is not ill-appearing.  HENT:     Head: Normocephalic and atraumatic.     Mouth/Throat:     Mouth: Mucous membranes are moist.  Cardiovascular:     Rate and Rhythm: Normal rate.  Pulmonary:     Effort: Pulmonary effort is normal.      Breath sounds: No wheezing or rales.  Abdominal:     General: Abdomen is flat. Bowel sounds are normal.     Palpations: Abdomen is soft.     Tenderness: There is abdominal tenderness in the right lower quadrant, suprapubic area and left lower quadrant. There is left CVA tenderness.  Skin:    General: Skin is warm and dry.  Neurological:     Mental Status: She is alert.    ED Results / Procedures / Treatments   Labs (all labs ordered are listed, but only abnormal results are displayed) Labs Reviewed  COMPREHENSIVE METABOLIC PANEL - Abnormal; Notable for the following components:      Result Value   Glucose, Bld 144 (*)    All other components within normal limits  URINALYSIS, ROUTINE W REFLEX MICROSCOPIC - Abnormal; Notable for the following components:   APPearance CLOUDY (*)    Hgb urine dipstick LARGE (*)    Protein, ur 100 (*)    Leukocytes,Ua LARGE (*)    RBC / HPF >50 (*)    WBC, UA >50 (*)    Bacteria, UA RARE (*)    All other components within normal limits  URINE CULTURE  CBC WITH DIFFERENTIAL/PLATELET    EKG None  Radiology CT Renal Stone Study  Result Date: 04/29/2021 CLINICAL DATA:  Flank pain side not specified, abdominal pain, hematuria, suspected kidney stone. History kidney stones, cholecystectomy, hysterectomy, type II diabetes mellitus, former smoker EXAM: CT ABDOMEN AND PELVIS WITHOUT CONTRAST TECHNIQUE: Multidetector CT imaging of the abdomen and pelvis was performed following the standard protocol without IV contrast. Sagittal and coronal MPR images reconstructed from axial data set. No oral contrast administered. COMPARISON:  11/16/2020 FINDINGS: Lower chest: Minimal linear subsegmental atelectasis at LEFT lung base. Hepatobiliary:  Gallbladder surgically absent. Liver normal appearance. Pancreas: Normal appearance Spleen: Normal appearance Adrenals/Urinary Tract: Adrenal glands normal appearance. Kidneys, ureters, and bladder normal appearance. No urinary  tract calcification or dilatation. Stomach/Bowel: Normal appendix. Extensive colonic diverticulosis diffusely without evidence of diverticulitis. Stomach and bowel loops otherwise normal appearance. Vascular/Lymphatic: Atherosclerotic calcifications aorta and iliac arteries without aneurysm. Reproductive: Uterus surgically absent. Nonvisualization of ovaries. Other: No free air or free fluid. No hernia or inflammatory process. Musculoskeletal: Bones demineralized. IMPRESSION: Extensive colonic diverticulosis without evidence of diverticulitis. No acute intra-abdominal or intrapelvic abnormalities. Aortic Atherosclerosis (ICD10-I70.0). Electronically Signed   By: Lavonia Dana M.D.   On: 04/29/2021 11:03    Procedures Procedures   Medications Ordered in ED Medications - No data to display  ED Course  I have reviewed the triage vital signs and the nursing notes.  Pertinent labs & imaging results that were available during my care of the patient were reviewed by me and considered in my medical decision making (see chart for details).  Clinical Course as of 04/29/21 1125  Mon Apr 29, 2021  1109 WBC, UA(!): >50 [JS]  1109 Bacteria, UA(!): RARE [JS]  1109 Leukocytes,Ua(!): LARGE [JS]    Clinical Course User Index [JS] Janeece Fitting, PA-C   MDM Rules/Calculators/A&P    Patient with a past medical history of H. pylori presents to the ED with a chief complaint of abdominal pain that has been ongoing for the past 4 days.  Also endorses some urinary symptoms of dysuria, frequency, gross hematuria.  She has not taken any medication for improvement in symptoms after hydrocodone that she is prescribed for her "bad knee ".  During evaluation patient is overall nontoxic, non-ill-appearing.  Thus not reporting nausea at this time, no vomiting.  No diarrhea or blood in her stool.  Previously followed by [facility] with a recent endoscopy along with colonoscopy.  Sam is benign with some tenderness along the  left flank.  Some suspicion for kidney stones as he reports similar symptoms in etiology with the previous kidney stone diagnoses.  Interpretation of her labs reveal a CBC with no leukocytosis, hemoglobin is within normal limits.  CMP without any electrolyte derangement, creatinine levels within normal limits.  LFTs are unremarkable.  UA is currently pending.  CT Renal showed:  Extensive colonic diverticulosis without evidence of diverticulitis.     No acute intra-abdominal or intrapelvic abnormalities.     Aortic Atherosclerosis (ICD10-I70.0).      UA with large leukocytes, greater than 50 white blood cell count, no nitrites large amount of hemoglobin.  We discussed treatment with antibiotics at this time, 1 tablet twice daily for the next 7 days.  Strict return precautions were given, I do not suspect pyelonephritis at this time, no white blood cell count.  She is afebrile tolerating p.o.  She understands and agrees to management, return precautions discussed at length.   Portions of this note were generated with Lobbyist. Dictation errors may occur despite best attempts at proofreading.  Final Clinical Impression(s) / ED Diagnoses Final diagnoses:  Acute cystitis with hematuria    Rx / DC Orders ED Discharge Orders          Ordered    cephALEXin (KEFLEX) 500 MG capsule  2 times daily        04/29/21 1113             Janeece Fitting, PA-C 04/29/21 1125    Dorie Rank, MD 04/30/21 1444

## 2021-04-29 NOTE — Discharge Instructions (Addendum)
You were prescribed antibiotics for your urinary tract infection.  Please take 1 tablet twice a day for the next 7 days.  If you experience any back pain, fever, nausea or vomiting you will need to return to the emergency department.

## 2021-04-29 NOTE — ED Triage Notes (Signed)
Pt c/o abdominal pain and blood in her urine

## 2021-05-01 LAB — URINE CULTURE: Culture: 70000 — AB

## 2021-05-02 ENCOUNTER — Ambulatory Visit: Payer: Medicaid Other | Admitting: Internal Medicine

## 2021-05-02 ENCOUNTER — Telehealth: Payer: Self-pay | Admitting: *Deleted

## 2021-05-02 ENCOUNTER — Other Ambulatory Visit (HOSPITAL_COMMUNITY): Payer: Self-pay

## 2021-05-02 ENCOUNTER — Ambulatory Visit (HOSPITAL_COMMUNITY)
Admission: RE | Admit: 2021-05-02 | Discharge: 2021-05-02 | Disposition: A | Discharge status: Home / Self Care | Payer: Medicaid Other | Source: Ambulatory Visit | Attending: Internal Medicine | Admitting: Internal Medicine

## 2021-05-02 VITALS — BP 127/80 | HR 100 | Wt 194.5 lb

## 2021-05-02 DIAGNOSIS — N390 Urinary tract infection, site not specified: Secondary | ICD-10-CM | POA: Diagnosis not present

## 2021-05-02 DIAGNOSIS — R0789 Other chest pain: Secondary | ICD-10-CM | POA: Diagnosis not present

## 2021-05-02 DIAGNOSIS — E119 Type 2 diabetes mellitus without complications: Secondary | ICD-10-CM

## 2021-05-02 DIAGNOSIS — G4730 Sleep apnea, unspecified: Secondary | ICD-10-CM

## 2021-05-02 DIAGNOSIS — K2970 Gastritis, unspecified, without bleeding: Secondary | ICD-10-CM | POA: Diagnosis not present

## 2021-05-02 DIAGNOSIS — N952 Postmenopausal atrophic vaginitis: Secondary | ICD-10-CM

## 2021-05-02 DIAGNOSIS — R1084 Generalized abdominal pain: Secondary | ICD-10-CM

## 2021-05-02 DIAGNOSIS — E785 Hyperlipidemia, unspecified: Secondary | ICD-10-CM | POA: Diagnosis not present

## 2021-05-02 DIAGNOSIS — R079 Chest pain, unspecified: Secondary | ICD-10-CM | POA: Diagnosis not present

## 2021-05-02 LAB — GLUCOSE, CAPILLARY: Glucose-Capillary: 110 mg/dL — ABNORMAL HIGH (ref 70–99)

## 2021-05-02 LAB — POCT GLYCOSYLATED HEMOGLOBIN (HGB A1C): Hemoglobin A1C: 6.3 % — AB (ref 4.0–5.6)

## 2021-05-02 MED ORDER — PRAZOSIN HCL 1 MG PO CAPS
2.0000 mg | ORAL_CAPSULE | Freq: Every day | ORAL | 2 refills | Status: DC
  Filled 2021-05-02: qty 60, 30d supply, fill #0
  Filled 2021-07-08: qty 60, 30d supply, fill #1

## 2021-05-02 MED ORDER — LAMOTRIGINE 100 MG PO TABS
100.0000 mg | ORAL_TABLET | Freq: Every day | ORAL | 2 refills | Status: DC
  Filled 2021-05-02: qty 30, 30d supply, fill #0
  Filled 2021-05-31: qty 30, 30d supply, fill #1
  Filled 2021-07-08: qty 30, 30d supply, fill #2

## 2021-05-02 MED ORDER — PRAZOSIN HCL 1 MG PO CAPS
2.0000 mg | ORAL_CAPSULE | Freq: Every day | ORAL | 2 refills | Status: DC
  Filled 2021-05-02: qty 60, 30d supply, fill #0

## 2021-05-02 NOTE — Telephone Encounter (Signed)
Post ED Visit - Positive Culture Follow-up  Culture report reviewed by antimicrobial stewardship pharmacist: [place] [facility] Team '[]'$  Elenor Quinones, Pharm.D. '[]'$  Heide Guile, Pharm.D., BCPS AQ-ID '[]'$  Parks Neptune, Pharm.D., BCPS '[]'$  Alycia Rossetti, Pharm.D., BCPS '[]'$  Reserve, Pharm.D., BCPS, AAHIVP '[]'$  Legrand Como, Pharm.D., BCPS, AAHIVP '[]'$  Salome Arnt, PharmD, BCPS '[]'$  Johnnette Gourd, PharmD, BCPS '[]'$  Hughes Better, PharmD, BCPS '[]'$  Leeroy Cha, PharmD '[]'$  Laqueta Linden, PharmD, BCPS '[]'$  Albertina Parr, PharmD  [place] Team '[]'$  Leodis Sias, PharmD '[]'$  Lindell Spar, PharmD '[]'$  Royetta Asal, PharmD '[]'$  Graylin Shiver, Rph '[]'$  Rema Fendt) Glennon Mac, PharmD '[]'$  Arlyn Dunning, PharmD '[]'$  Netta Cedars, PharmD '[]'$  Dia Sitter, PharmD '[]'$  Leone Haven, PharmD '[]'$  Gretta Arab, PharmD '[]'$  Theodis Shove, PharmD '[]'$  Peggyann Juba, PharmD '[]'$  Reuel Boom, PharmD   Positive urine culture Treated with Cephalexin, organism sensitive to the same and no further patient follow-up is required at this time.  Elenor Quinones, PharmD  Harlon Flor Talley 05/02/2021, 10:19 AM

## 2021-05-02 NOTE — Patient Instructions (Addendum)
[name], it was a pleasure seeing you today!  Today we discussed: Abdominal Pain and urinary pain: You had some blood in the urine and pain during urination that prompted you to go the ED. They performed CT and urine testing. CT was negative for any issues but urine testing found a urine infection and you were started on an antibiotic. You endorsed an improvement in your symptoms after starting antibiotics. Please ensure you complete this antibiotic. Your chronic abdominal pain appears to be consistent with IBS which is irritable bowel syndrome as you get cramping and relieve with bowel movement. I recommend you try a Probiotic and Prebiotic which is OTC. You are already taking fiber supplement which helps as well. Work on keeping your stress level low as it can cause worsening of symptoms.  Fatigue: You complained about being drained and low of energy even during morning. I will reorder a sleep study as you had CPAP before.  Chest pain/arm numbness: You complained about chest pain that is non-exertional, and reproducible. The numbness can be reproduced as well. You had normal range of motion and normal strength. Your EKG was normal. I believe your symptoms are due to MSK etiology. I recommend using IcyHot with lidocaine and Voltaren gel on the affected areas. You can take tylenol for pain but don't take NSAID as you have a history of H. Pylori. If chest pain worsens or you get it while exerting yourself, please seek emergent care.   I have ordered the following labs today:  Lab Orders         Glucose, capillary         Lipid Profile         POC Hbg A1C      Tests ordered today:  EKG: Your EKG was normal Sleep Study  Referrals ordered today:   Referral Orders  No referral(s) requested today     I have ordered the following medication/changed the following medications:   Stop the following medications: There are no discontinued medications.   Start the following medications: No  orders of the defined types were placed in this encounter.    Follow-up: 3 Months  Please make sure to arrive 15 minutes prior to your next appointment. If you arrive late, you may be asked to reschedule.   We look forward to seeing you next time. Please call our clinic at [phone] if you have any questions or concerns. The best time to call is Monday-Friday from 9am-4pm, but there is someone available 24/7. If after hours or the weekend, call the main hospital number and ask for the Internal Medicine Resident On-Call. If you need medication refills, please notify your pharmacy one week in advance and they will send [place] a request.  Thank you for letting us take part in your care. Wishing you the best!  Thank you, Idamae Schuller, MD

## 2021-05-03 LAB — LIPID PANEL
Chol/HDL Ratio: 2.7 ratio (ref 0.0–4.4)
Cholesterol, Total: 165 mg/dL (ref 100–199)
HDL: 62 mg/dL (ref >39)
LDL Chol Calc (NIH): 87 mg/dL (ref 0–99)
Triglycerides: 89 mg/dL (ref 0–149)
VLDL Cholesterol Cal: 16 mg/dL (ref 5–40)

## 2021-05-03 NOTE — Progress Notes (Signed)
   CC: ED follow up  HPI:  [name] is a 60 y.o. with medical history as below presenting to [facility] for ED follow up.  Please see problem-based list for further details, assessments, and plans.  Past Medical History:  Diagnosis Date   Anxiety    Arthritis    Bipolar depression ([place]) 09/2015   Dizziness    Elevated blood pressure, situational    GERD (gastroesophageal reflux disease)    History of colon polyps    History of patellar fracture 2016   History of tibial fracture 2016   Hyperlipidemia    Night terrors    Night terrors, adult 09/2015   Post concussion syndrome 12/20/2019   Sleep apnea    doesnt wear c-pap   Review of Systems:  Negative unless stated in problem list.     Physical Exam:  Vitals:   05/02/21 0941  BP: 127/80  Pulse: 100  SpO2: 100%  Weight: 194 lb 8 oz (88.2 kg)    Physical Exam Constitutional:      Appearance: She is well-developed.  HENT:     Head: Normocephalic and atraumatic.     Mouth/Throat:     Mouth: Mucous membranes are moist.     Pharynx: Oropharynx is clear.  Cardiovascular:     Rate and Rhythm: Normal rate and regular rhythm.     Heart sounds: Normal heart sounds.  Pulmonary:     Effort: Pulmonary effort is normal.     Breath sounds: Normal breath sounds.  Abdominal:     General: Abdomen is flat. Bowel sounds are normal. There is no distension.     Palpations: Abdomen is soft.     Tenderness: There is abdominal tenderness in the right lower quadrant and left lower quadrant.  Musculoskeletal:        General: Tenderness (Tenderness to palpation of upper left chest and left upper back with numbness in the left arm) present.  Skin:    General: Skin is warm and dry.     Capillary Refill: Capillary refill takes less than 2 seconds.  Neurological:     General: No focal deficit present.     Mental Status: She is alert.  Psychiatric:        Mood and Affect: Mood normal.        Behavior: Behavior normal.    Assessment  & Plan:   See Encounters Tab for problem based charting.  Patient seen with Dr. Odella Aquas, MD

## 2021-05-06 ENCOUNTER — Encounter: Payer: Self-pay | Admitting: Internal Medicine

## 2021-05-06 DIAGNOSIS — R5383 Other fatigue: Secondary | ICD-10-CM | POA: Insufficient documentation

## 2021-05-06 DIAGNOSIS — R1084 Generalized abdominal pain: Secondary | ICD-10-CM

## 2021-05-06 HISTORY — DX: Generalized abdominal pain: R10.84

## 2021-05-06 NOTE — Assessment & Plan Note (Signed)
Assessment: Patient has T2DM that is well controlled with metformin 500 mg qd. A1c on 05/02/21 was 6.3.  Plan:  -Continue metformin 500 mg qd

## 2021-05-06 NOTE — Assessment & Plan Note (Signed)
Assessment: Patient had hematuria along with pain with urination and went to the ED. UA and urine culture showed E. Coli growth. She was prescribed Keflex. Advised patient to finish antibiotics completely.   Plan: -Complete antibiotic course to eradicate the bacteria.  -Use Replens for vaginal dryness.

## 2021-05-06 NOTE — Assessment & Plan Note (Signed)
Assessment: Patient complained of chest pain and left arm numbness. She also had back pain in the same area. Pain was reproducible with palpation. EKG was obtained to rule out cardiac causes and showed NSR. Patient stated she may have slept on it since it occurred after waking up. I advised her Voltaren gel along with IcyHot. I educated her about causes for concern in chest pain and when to seek emergency care.   Plan: -Voltaren gel and IcyHot for pain control -Shoulder exercises

## 2021-05-06 NOTE — Assessment & Plan Note (Signed)
Assessment: Patient had vaginal dryness and we recommended Replens as patient didn't like the side effects of Premarin cream. She stated she had not tried it yet. Advised patient to use Replens as vaginal dryness can lead to increased UTIs.   Plan: -Recommended using Replens.

## 2021-05-06 NOTE — Assessment & Plan Note (Signed)
Assessment: Patient endorsed fatigue and stated she feels drained as soon as waking up. She also endorsed hypersomnolence throughout her day and stated she used to have CPAP before. Advised a repeat study to see if sleep apnea is cause of her fatigue.   Plan: -Sleep study referral.

## 2021-05-06 NOTE — Assessment & Plan Note (Signed)
Assessment: Patient's has hyperlipidemia that is controlled. Lipid panel on 05/02/21 showed cholesterol 165, LDL 87, and 62. Current medications include atorvastatin 80 mg daily. Patient endorsed adherence.   Plan: -Continue current medications -Continue to make lifestyle modifications.

## 2021-05-06 NOTE — Assessment & Plan Note (Addendum)
Assessment: Patient finished her treatment for H. Pylori and her stool test was negative for bacteria. Advised patient to continue taking Protonix 40 mg BID. Patient noted improvement in her epigastric pain and denied symptoms of heartburn.   Plan: -Continue Protonix 40 mg BID

## 2021-05-06 NOTE — Assessment & Plan Note (Addendum)
Assessment: Patient has generalized abdominal discomfort that resolves after a bowel movement. She stated it was worse if she was under more stress and she endorsed currently being under stress due to job demands. Told patient these symptoms were consistent with IBS as her imaging from couple days ago was negative for any acute findings. She endorsed constipation as a chronic issue as well.    Plan: -Continue fiber supplementation with adequate hydration -Advised patient to start pre and probiotics -Work on stress reduction activities

## 2021-05-07 ENCOUNTER — Other Ambulatory Visit (HOSPITAL_COMMUNITY): Payer: Self-pay

## 2021-05-07 NOTE — Progress Notes (Signed)
Internal Medicine Clinic Attending  I saw and evaluated the patient.  I personally confirmed the key portions of the history and exam documented by Dr. Khan and I reviewed pertinent patient test results.  The assessment, diagnosis, and plan were formulated together and I agree with the documentation in the resident's note.  

## 2021-05-15 ENCOUNTER — Telehealth: Payer: Self-pay | Admitting: *Deleted

## 2021-05-15 NOTE — Telephone Encounter (Signed)
Call from pt  - c/o lower abd pain also lower back pain for almost a month; esp when she urinates/has a BM. She went to the ED on 8/29 - dx with UTI. Also states she has been having migraine h/a's x 2 weeks, which is new for her; and she has been taking Tylenol. Requesting an appt but it has to early in the morning b/c she has to go to work and she prefers with her PCP. Offer 1045 AM tomorrow but states she has to be at work by [place] office has schedule an appt Monday 9/16  @ 0845 AM with Dr Gilford Rile (Dr Humphrey Rolls will not be here). Pt is aware to go to ED/UC if condition worsens.

## 2021-05-20 ENCOUNTER — Encounter: Payer: Medicaid Other | Admitting: Internal Medicine

## 2021-05-26 ENCOUNTER — Encounter (HOSPITAL_COMMUNITY): Payer: Self-pay | Admitting: Emergency Medicine

## 2021-05-26 ENCOUNTER — Emergency Department (HOSPITAL_COMMUNITY)
Admission: EM | Admit: 2021-05-26 | Discharge: 2021-05-26 | Disposition: A | Discharge status: Home / Self Care | Payer: Medicaid Other | Attending: Emergency Medicine | Admitting: Emergency Medicine

## 2021-05-26 ENCOUNTER — Emergency Department (HOSPITAL_COMMUNITY): Payer: Medicaid Other

## 2021-05-26 DIAGNOSIS — E785 Hyperlipidemia, unspecified: Secondary | ICD-10-CM | POA: Diagnosis not present

## 2021-05-26 DIAGNOSIS — R1032 Left lower quadrant pain: Secondary | ICD-10-CM | POA: Diagnosis not present

## 2021-05-26 DIAGNOSIS — Z7984 Long term (current) use of oral hypoglycemic drugs: Secondary | ICD-10-CM | POA: Insufficient documentation

## 2021-05-26 DIAGNOSIS — Z87891 Personal history of nicotine dependence: Secondary | ICD-10-CM | POA: Diagnosis not present

## 2021-05-26 DIAGNOSIS — E1169 Type 2 diabetes mellitus with other specified complication: Secondary | ICD-10-CM | POA: Diagnosis not present

## 2021-05-26 DIAGNOSIS — K219 Gastro-esophageal reflux disease without esophagitis: Secondary | ICD-10-CM | POA: Diagnosis not present

## 2021-05-26 DIAGNOSIS — N9489 Other specified conditions associated with female genital organs and menstrual cycle: Secondary | ICD-10-CM | POA: Diagnosis not present

## 2021-05-26 DIAGNOSIS — Z91040 Latex allergy status: Secondary | ICD-10-CM | POA: Insufficient documentation

## 2021-05-26 LAB — COMPREHENSIVE METABOLIC PANEL WITH GFR
ALT: 31 U/L (ref 0–44)
AST: 23 U/L (ref 15–41)
Albumin: 4.1 g/dL (ref 3.5–5.0)
Alkaline Phosphatase: 69 U/L (ref 38–126)
Anion gap: 10 (ref 5–15)
BUN: 13 mg/dL (ref 8–23)
CO2: 26 mmol/L (ref 22–32)
Calcium: 9.1 mg/dL (ref 8.9–10.3)
Chloride: 103 mmol/L (ref 98–111)
Creatinine, Ser: 0.52 mg/dL (ref 0.44–1.00)
GFR, Estimated: >60 mL/min
Glucose, Bld: 119 mg/dL — ABNORMAL HIGH (ref 70–99)
Potassium: 4 mmol/L (ref 3.5–5.1)
Sodium: 139 mmol/L (ref 135–145)
Total Bilirubin: 0.5 mg/dL (ref 0.3–1.2)
Total Protein: 7 g/dL (ref 6.5–8.1)

## 2021-05-26 LAB — CBC WITH DIFFERENTIAL/PLATELET
Abs Immature Granulocytes: 0.02 10*3/uL (ref 0.00–0.07)
Basophils Absolute: 0 10*3/uL (ref 0.0–0.1)
Basophils Relative: 1 %
Eosinophils Absolute: 0.1 10*3/uL (ref 0.0–0.5)
Eosinophils Relative: 2 %
HCT: 39.1 % (ref 36.0–46.0)
Hemoglobin: 12.3 g/dL (ref 12.0–15.0)
Immature Granulocytes: 0 %
Lymphocytes Relative: 37 %
Lymphs Abs: 2.5 10*3/uL (ref 0.7–4.0)
MCH: 26.7 pg (ref 26.0–34.0)
MCHC: 31.5 g/dL (ref 30.0–36.0)
MCV: 84.8 fL (ref 80.0–100.0)
Monocytes Absolute: 0.6 10*3/uL (ref 0.1–1.0)
Monocytes Relative: 9 %
Neutro Abs: 3.5 10*3/uL (ref 1.7–7.7)
Neutrophils Relative %: 51 %
Platelets: 311 10*3/uL (ref 150–400)
RBC: 4.61 MIL/uL (ref 3.87–5.11)
RDW: 14.6 % (ref 11.5–15.5)
WBC: 6.8 10*3/uL (ref 4.0–10.5)
nRBC: 0 % (ref 0.0–0.2)

## 2021-05-26 LAB — URINALYSIS, ROUTINE W REFLEX MICROSCOPIC
Bacteria, UA: NONE SEEN
Bilirubin Urine: NEGATIVE
Glucose, UA: NEGATIVE mg/dL
Hgb urine dipstick: NEGATIVE
Ketones, ur: NEGATIVE mg/dL
Leukocytes,Ua: NEGATIVE
Nitrite: NEGATIVE
Protein, ur: NEGATIVE mg/dL
Specific Gravity, Urine: 1.02 (ref 1.005–1.030)
pH: 6 (ref 5.0–8.0)

## 2021-05-26 LAB — I-STAT BETA HCG BLOOD, ED (MC, WL, AP ONLY): I-stat hCG, quantitative: <5 m[IU]/mL

## 2021-05-26 LAB — LIPASE, BLOOD: Lipase: 34 U/L (ref 11–51)

## 2021-05-26 MED ORDER — IOHEXOL 350 MG/ML SOLN
100.0000 mL | Freq: Once | INTRAVENOUS | Status: AC | PRN
  Administered 2021-05-26: 80 mL via INTRAVENOUS

## 2021-05-26 MED ORDER — HYOSCYAMINE SULFATE SL 0.125 MG SL SUBL
1.0000 | SUBLINGUAL_TABLET | Freq: Three times a day (TID) | SUBLINGUAL | 0 refills | Status: DC | PRN
  Filled 2021-05-29: qty 30, 10d supply, fill #0

## 2021-05-26 MED ORDER — SODIUM CHLORIDE (PF) 0.9 % IJ SOLN
INTRAMUSCULAR | Status: AC
  Filled 2021-05-26: qty 50

## 2021-05-26 MED ORDER — DOCUSATE SODIUM 100 MG PO CAPS: 100.0000 mg | ORAL_CAPSULE | Freq: Two times a day (BID) | ORAL | 0 refills | Status: DC

## 2021-05-26 NOTE — ED Provider Notes (Signed)
Emergency Medicine Provider Triage Evaluation Note  Syrena Burges , a 60 y.o. female  was evaluated in triage.  Pt complains of lower quadrant abdominal pain.  Patient reports that pain has been present over the last 6 months rotation today.  Patient states that pain is worse and now is described as a burning sensation.  Pain is now also radiating to right lower quadrant.  Review of Systems  Positive: Abdominal pain Negative: Fever, chills, nausea, vomiting, dysuria, hematuria, diarrhea, constipation, blood in stool, melena  Physical Exam  BP 139/86 (BP Location: Left Arm)   Pulse 78   Temp 98.1 F (36.7 C) (Oral)   Resp 16   SpO2 99%  Gen:   Awake, no distress   Resp:  Normal effort  MSK:   Moves extremities without difficulty  Other:  Abdomen soft, nondistended, mild tenderness to lower left and lower right quadrant  Medical Decision Making  Medically screening exam initiated at 9:18 AM.  Appropriate orders placed.  [name] was informed that the remainder of the evaluation will be completed by another provider, this initial triage assessment does not replace that evaluation, and the importance of remaining in the ED until their evaluation is complete.  The patient appears stable so that the remainder of the work up may be completed by another provider.      Loni Beckwith, PA-C 05/26/21 2957    Charlesetta Shanks, MD 06/19/21 1606

## 2021-05-26 NOTE — ED Triage Notes (Signed)
Patient here from home reporting left sided abd pain radiating into back. Seen for same last month. States symptoms started 6 months ago with no relief.

## 2021-05-26 NOTE — Discharge Instructions (Addendum)
1.  Follow-up with your family doctor and discuss further evaluation for your persistent pain.  You might benefit from reevaluation by gastroenterology or general surgery. 2.  Try taking Colace twice daily as prescribed to make sure you do not become constipated.  For cramping pain episodes you may try Hyoscyamine (Levsin) as prescribed.  Dissolves under the tongue.  3.  Return to emergency department for get fever, vomiting, worsening pain or other concerning symptoms.

## 2021-05-26 NOTE — ED Provider Notes (Signed)
[facility] DEPT Provider Note   CSN: [id number] Arrival date & time: 05/26/21  9417     History Chief Complaint  Patient presents with   Abdominal Pain    Meghan Wilson is a 60 y.o. female.  HPI Patient reports has had persistent fairly longstanding problems with pain in her left lower abdomen.  Is been going on for a number of months.  Patient reports that pains will be uncomfortable cramping and sharp with burning quality.  She reports it seems to be getting progressively worse.  No vomiting.  Patient denies diarrhea but reports that about twice a week she takes MiraLAX to have a bowel movement.  No pain or burning with urination.  No vomiting. patient reports she is had a colonoscopy and upper endoscopy to evaluate for pain.  She takes Tylenol pretty regularly but nothing really helps.    Past Medical History:  Diagnosis Date   Anxiety    Arthritis    Bipolar depression ([place]) 09/2015   Dizziness    Elevated blood pressure, situational    GERD (gastroesophageal reflux disease)    History of colon polyps    History of patellar fracture 2016   History of tibial fracture 2016   Hyperlipidemia    Night terrors    Night terrors, adult 09/2015   Pain of right knee after injury 03/10/2018   Post concussion syndrome 12/20/2019   Seasonal allergies 11/09/2020   Sleep apnea    doesnt wear c-pap    Patient Active Problem List   Diagnosis Date Noted   Generalized abdominal discomfort 05/06/2021   Fatigue 05/06/2021   UTI (urinary tract infection) 05/02/2021   Chest pain, musculoskeletal 05/02/2021   Vaginal atrophy 11/09/2020   Type II diabetes mellitus ([place]) 02/16/2019   Healthcare maintenance 01/02/2018   Hyperlipidemia 01/01/2018   Peptic gastritis 01/01/2018   Bipolar disorder ([place]) 08/03/2015    Past Surgical History:  Procedure Laterality Date   ABDOMINAL HYSTERECTOMY     CHOLECYSTECTOMY     CLOSED REDUCTION TIBIAL FRACTURE Right 2003    COLONOSCOPY  2016   [place]  2022     OB History     Gravida  3   Para  2   Term  2   Preterm      AB  1   Living  2      SAB  1   IAB      Ectopic      Multiple      Live Births  2           Family History  Problem Relation Age of Onset   Hyperlipidemia Mother    Hypertension Mother    Colon polyps Mother    Heart disease Mother    Arthritis/Rheumatoid Father    Hyperlipidemia Maternal Aunt    Hypertension Maternal Aunt    Cancer Maternal Aunt    Colon polyps Maternal Grandmother    Colon cancer Neg Hx    Esophageal cancer Neg Hx    Stomach cancer Neg Hx    Rectal cancer Neg Hx     Social History   Tobacco Use   Smoking status: Former    Packs/day: 0.25    Years: 4.00    Pack years: 1.00    Types: Cigarettes    Quit date: 12/21/2017    Years since quitting: 3.4   Smokeless tobacco: Never  Vaping Use   Vaping Use:  Never used  Substance Use Topics   Alcohol use: Yes    Comment: special occasions   Drug use: No    Home Medications Prior to Admission medications   Medication Sig Start Date End Date Taking? Authorizing Provider  docusate sodium (COLACE) 100 MG capsule Take 1 capsule (100 mg total) by mouth every 12 (twelve) hours. 05/26/21  Yes Aarron Wierzbicki, Jeannie Done, MD  Hyoscyamine Sulfate SL (LEVSIN/SL) 0.125 MG SUBL Place 1 tablet under the tongue every 8 (eight) hours as needed. 05/26/21  Yes Charlesetta Shanks, MD  Accu-Chek FastClix Lancets MISC USE 1 TO CHECK BLOOD SUGAR UP TO 7 TIMES A WEEK. 09/19/20 09/19/21  Marianna Payment, MD  acetaminophen (TYLENOL) 325 MG tablet TAKE 1 TO 2 TABLETS EVERY 6 HOURS AS NEEDED. 11/14/20 11/14/21  Hortencia Pilar, DMD  atorvastatin (LIPITOR) 80 MG tablet TAKE 1 TABLET (80 MG TOTAL) BY MOUTH DAILY. 09/03/20 09/03/21  Marianna Payment, MD  calcium-vitamin D (OSCAL WITH D) 500-200 MG-UNIT tablet Take 1 tablet by mouth.    [provider]  chlorhexidine (PERIDEX) 0.12 % solution RINSE  MOUTH WITH 15ML (1 CAPFUL) FOR 30 SECONDS AM AND PM AFTER TOOTHBRUSHING. EXPECTORATE AFTER RINSING, DO NOT SWALLOW 11/14/20 11/14/21  Hortencia Pilar, DMD  conjugated estrogens (PREMARIN) vaginal cream Place 1 applicatorful vaginally at bedtime for two weeks, then use three times a week as directed 02/22/21   Anyanwu, Sallyanne Havers, MD  dicyclomine (BENTYL) 10 MG capsule TAKE 1 CAPSULE BY MOUTH THREE TIMES DAILY AS NEEDED FOR SPASMS. Patient not taking: No sig reported 11/09/20 11/09/21  Noralyn Pick, NP  FIBER ADULT GUMMIES PO Take by mouth.    [provider]  fluticasone (FLONASE) 50 MCG/ACT nasal spray PLACE 1 SPRAY INTO BOTH NOSTRILS DAILY AS NEEDED FOR ALLERGIES OR RHINITIS. 11/09/20 11/09/21  Seawell, Jaimie A, DO  glucose blood test strip USE TO CHECK BLOOD GLUCOSE UP TO 7 TIMES A WEEK. Patient taking differently: USE TO CHECK BLOOD GLUCOSE UP TO 7 TIMES A WEEK. 09/19/20 09/19/21  Marianna Payment, MD  lamoTRIgine (LAMICTAL) 100 MG tablet TAKE 1 TABLET BY MOUTH ONCE A DAY 03/30/20 03/30/21    lamoTRIgine (LAMICTAL) 100 MG tablet Take 1 tablet (100 mg total) by mouth daily. 05/02/21     lidocaine (LIDODERM) 5 % Place 1 patch onto the skin daily. May wear up to 12 hours (12 hours on and 12 hours off) Patient not taking: Reported on 01/11/2021 12/10/20     metFORMIN (GLUCOPHAGE-XR) 500 MG 24 hr tablet TAKE 1 TABLET (500 MG TOTAL) BY MOUTH DAILY WITH BREAKFAST. 10/12/20 10/12/21  Cato Mulligan, MD  ondansetron (ZOFRAN-ODT) 4 MG disintegrating tablet DISSOLVE 1 TABLET UNDER THE TONGUE EVERY 8 HOURS AS NEEDED FOR NAUSEA AND VOMITING. 11/09/20 11/09/21  Noralyn Pick, NP  pantoprazole (PROTONIX) 40 MG tablet TAKE 1 TABLET (40 MG TOTAL) BY MOUTH 2 (TWO) TIMES DAILY BEFORE A MEAL. 10/12/20   Cato Mulligan, MD  prazosin (MINIPRESS) 1 MG capsule TAKE 1 CAPSULE BY MOUTH AT BEDTIME 03/30/20 03/30/21    prazosin (MINIPRESS) 1 MG capsule Take 2 capsules (2 mg total) by mouth at bedtime. 01/04/21      prazosin (MINIPRESS) 1 MG capsule Take 2 capsules (2 mg total) by mouth at bedtime. 05/02/21     prazosin (MINIPRESS) 1 MG capsule Take 2 capsules (2 mg total) by mouth at bedtime. 05/02/21       Allergies    Depakote [divalproex sodium], Ibuprofen, Prednisone, and Latex  Review of Systems  Review of Systems 10 systems reviewed and negative except as per HPI Physical Exam Updated Vital Signs BP 116/88   Pulse 85   Temp 98.1 F (36.7 C) (Oral)   Resp 18   SpO2 100%   Physical Exam Constitutional:      Comments: Alert nontoxic clinically well in appearance.  HENT:     Mouth/Throat:     Pharynx: Oropharynx is clear.  Eyes:     Extraocular Movements: Extraocular movements intact.  Cardiovascular:     Rate and Rhythm: Normal rate and regular rhythm.  Pulmonary:     Effort: Pulmonary effort is normal.     Breath sounds: Normal breath sounds.  Abdominal:     General: There is no distension.     Palpations: Abdomen is soft.     Tenderness: There is no abdominal tenderness. There is no guarding.     Comments: Mild discomfort to palpation in the left lower abdomen.  No palpable masses or hernias.  Soft tissues are normal.  No CVA tenderness.  Musculoskeletal:        General: No swelling or tenderness. Normal range of motion.  Skin:    General: Skin is warm and dry.  Neurological:     General: No focal deficit present.     Mental Status: She is oriented to person, place, and time.  Psychiatric:        Mood and Affect: Mood normal.    ED Results / Procedures / Treatments   Labs (all labs ordered are listed, but only abnormal results are displayed) Labs Reviewed  COMPREHENSIVE METABOLIC PANEL - Abnormal; Notable for the following components:      Result Value   Glucose, Bld 119 (*)    All other components within normal limits  CBC WITH DIFFERENTIAL/PLATELET  LIPASE, BLOOD  URINALYSIS, ROUTINE W REFLEX MICROSCOPIC  I-STAT BETA HCG BLOOD, ED ([facility], [facility], [facility] ONLY)     EKG None  Radiology CT ABDOMEN PELVIS W CONTRAST  Result Date: 05/26/2021 CLINICAL DATA:  Diverticulitis suspected; LEFT flank pain EXAM: CT ABDOMEN AND PELVIS WITH CONTRAST TECHNIQUE: Multidetector CT imaging of the abdomen and pelvis was performed using the standard protocol following bolus administration of intravenous contrast. CONTRAST:  59mL OMNIPAQUE IOHEXOL 350 MG/ML SOLN COMPARISON:  April 29, 2021 FINDINGS: Lower chest: No acute abnormality. Hepatobiliary: Status post cholecystectomy. Focal fatty deposition adjacent to the falciform ligament. No extrahepatic or intrahepatic biliary ductal dilation. Pancreas: Unremarkable. No pancreatic ductal dilatation or surrounding inflammatory changes. Spleen: Normal in size without focal abnormality. Adrenals/Urinary Tract: Adrenal glands are unremarkable. Kidneys are normal, without renal calculi, focal lesion, or hydronephrosis. Bladder is unremarkable. Stomach/Bowel: No evidence of bowel obstruction. Moderate to large colonic stool burden diffusely throughout the colon. Diverticulosis without evidence of acute diverticulitis. Appendix is normal. Likely tiny hiatal hernia. Vascular/Lymphatic: Atherosclerotic calcifications. No new suspicious lymphadenopathy. Reproductive: Status post hysterectomy. No adnexal masses. Other: No significant abdominal wall hernia or abnormality. No abdominopelvic ascites. Musculoskeletal: LEFT greater than RIGHT facet arthropathy at L5-S1. IMPRESSION: 1. No suspicious CT etiology for LEFT flank pain identified. 2. Moderate to large colonic stool burden. Aortic Atherosclerosis (ICD10-I70.0). Electronically Signed   By: Valentino Saxon M.D.   On: 05/26/2021 13:04    Procedures Procedures   Medications Ordered in ED Medications  sodium chloride (PF) 0.9 % injection (has no administration in time range)  iohexol (OMNIPAQUE) 350 MG/ML injection 100 mL (80 mLs Intravenous Contrast Given 05/26/21 1234)    ED Course   I  have reviewed the triage vital signs and the nursing notes.  Pertinent labs & imaging results that were available during my care of the patient were reviewed by me and considered in my medical decision making (see chart for details).    MDM Rules/Calculators/A&P                           Patient has had pretty recurrent and chronic lower abdominal pain.  She has had a number of evaluations and different therapies tried.  Patient describes having had both upper and lower endoscopies.  CT scan today does not show acute findings but suggests a lot of stool.  We discussed possible differential diagnosis.  We also discussed possibility of adhesions given patient's history of several abdominal surgeries.  Plan will be to take Colace twice daily to avoid constipation and as needed Levsin.  Patient is to discuss further evaluation with either gastroenterology or general surgery regarding possible causes and solutions for the patient's pain.  At this time no acute surgical or infectious etiologies identified.  Patient is clinically well and stable to continue outpatient evaluation Final Clinical Impression(s) / ED Diagnoses Final diagnoses:  Left lower quadrant abdominal pain    Rx / DC Orders ED Discharge Orders          Ordered    Hyoscyamine Sulfate SL (LEVSIN/SL) 0.125 MG SUBL  Every 8 hours PRN        05/26/21 1433    docusate sodium (COLACE) 100 MG capsule  Every 12 hours        05/26/21 1433             Charlesetta Shanks, MD 05/26/21 1440

## 2021-05-26 NOTE — ED Notes (Signed)
Ambulatory to bathroom. No signs of distress.

## 2021-05-27 ENCOUNTER — Encounter: Payer: Medicaid Other | Admitting: Internal Medicine

## 2021-05-28 ENCOUNTER — Other Ambulatory Visit (HOSPITAL_COMMUNITY): Payer: Self-pay

## 2021-05-28 MED FILL — Metformin HCl Tab ER 24HR 500 MG: ORAL | 90 days supply | Qty: 90 | Fill #1 | Status: AC

## 2021-05-28 MED FILL — Atorvastatin Calcium Tab 80 MG (Base Equivalent): ORAL | 30 days supply | Qty: 30 | Fill #3 | Status: AC

## 2021-05-29 ENCOUNTER — Ambulatory Visit: Payer: Medicaid Other

## 2021-05-29 ENCOUNTER — Other Ambulatory Visit: Payer: Self-pay

## 2021-05-29 ENCOUNTER — Ambulatory Visit: Payer: Medicaid Other | Admitting: Internal Medicine

## 2021-05-29 ENCOUNTER — Ambulatory Visit
Admission: RE | Admit: 2021-05-29 | Discharge: 2021-05-29 | Disposition: A | Discharge status: Home / Self Care | Payer: Medicaid Other | Source: Ambulatory Visit | Attending: Family Medicine | Admitting: Family Medicine

## 2021-05-29 ENCOUNTER — Encounter: Payer: Self-pay | Admitting: Internal Medicine

## 2021-05-29 ENCOUNTER — Other Ambulatory Visit (HOSPITAL_COMMUNITY): Payer: Self-pay

## 2021-05-29 VITALS — Ht 63.0 in | Wt 198.8 lb

## 2021-05-29 DIAGNOSIS — T7840XD Allergy, unspecified, subsequent encounter: Secondary | ICD-10-CM

## 2021-05-29 DIAGNOSIS — S0031XA Abrasion of nose, initial encounter: Secondary | ICD-10-CM

## 2021-05-29 DIAGNOSIS — Z1231 Encounter for screening mammogram for malignant neoplasm of breast: Secondary | ICD-10-CM

## 2021-05-29 DIAGNOSIS — R1084 Generalized abdominal pain: Secondary | ICD-10-CM

## 2021-05-29 NOTE — Patient Instructions (Signed)
[name], it was a pleasure seeing you today!  Today we discussed: You came in with left lower quadrant abdominal pain after an ED visit. ED visit didn't show any abnormalities. It did show you had large amount of stool in your colon. Please continue using Miralax daily. Add fiber capsules as shown to you. Also add Sennakot until you can easily have bowel movement but then discontinue the sennakot. Please continue Miralax and fiber capsules. Also Start using prebioitic/probiotic once you stop sennakot to help normalize your GI flora. I will place a referral to GI. Please follow up with them.   I have ordered the following labs today:  Lab Orders  No laboratory test(s) ordered today      Referrals ordered today:    Referral Orders         Ambulatory referral to Gastroenterology      I have ordered the following medication/changed the following medications:   Stop the following medications: Medications Discontinued During This Encounter  Medication Reason   calcium-vitamin D (OSCAL WITH D) 500-200 MG-UNIT tablet Discontinued by provider   lamoTRIgine (LAMICTAL) 100 MG tablet Expired Prescription     Start the following medications: No orders of the defined types were placed in this encounter.    Follow-up: 1 month   Please make sure to arrive 15 minutes prior to your next appointment. If you arrive late, you may be asked to reschedule.   We look forward to seeing you next time. Please call our clinic at [phone] if you have any questions or concerns. The best time to call is Monday-Friday from 9am-4pm, but there is someone available 24/7. If after hours or the weekend, call the main hospital number and ask for the Internal Medicine Resident On-Call. If you need medication refills, please notify your pharmacy one week in advance and they will send [place] a request.  Thank you for letting us take part in your care. Wishing you the best!  Thank you, Idamae Schuller, MD

## 2021-05-30 ENCOUNTER — Other Ambulatory Visit (HOSPITAL_COMMUNITY): Payer: Self-pay

## 2021-05-30 MED ORDER — FLUTICASONE PROPIONATE 50 MCG/ACT NA SUSP
1.0000 | NASAL | 2 refills | Status: DC
  Filled 2021-05-30: qty 16, 60d supply, fill #0

## 2021-05-30 NOTE — Assessment & Plan Note (Signed)
Assessment: Patient presented with ED follow up for her abdominal pain. Pain is present primarily in LLQ. In the ED no cause was found as lab work and imaging were negative for any acute findings. The imaging did show she had large stool burden in the colon. Her colonoscopy showed she had diverticula throughout her colon. She endorsed one bowel movement daily but states she has to strain to have a bowel movement. Her symptoms improve after having a bowel movement and worsen with stress. She was worried about her kidneys being affected. She endorsed taking Miralax daily. I advised her to increase her bowel regimen, until she is able to produce a bowel movement easily. Referral made to GI as they wanted to see her if her pain persisted.   Plan: -Sennakot otc, Miralax daily, and fiber capsules -Once BM regular, start pre/probiotic as she has hx of extensive abx recently. -Referral to GI for further workup

## 2021-05-30 NOTE — Progress Notes (Signed)
   CC: ED follow up  HPI:  Meghan Wilson is a 60 y.o. with medical history as below presenting to [facility] for ED follow up.   Please see problem-based list for further details, assessments, and plans.  Past Medical History:  Diagnosis Date   Anxiety    Arthritis    Bipolar depression ([place]) 09/2015   Dizziness    Elevated blood pressure, situational    GERD (gastroesophageal reflux disease)    History of colon polyps    History of patellar fracture 2016   History of tibial fracture 2016   Hyperlipidemia    Night terrors    Night terrors, adult 09/2015   Pain of right knee after injury 03/10/2018   Post concussion syndrome 12/20/2019   Seasonal allergies 11/09/2020   Sleep apnea    doesnt wear c-pap   Review of Systems:  Review of system negative unless stated in the problem list or HPI.    Physical Exam:  Vitals:   05/29/21 1539  Weight: 198 lb 12.8 oz (90.2 kg)  Height: 5\' 3"  (1.6 m)    Physical Exam Constitutional:      General: She is not in acute distress.    Appearance: Normal appearance. She is obese. She is not ill-appearing.  HENT:     Head: Normocephalic and atraumatic.     Right Ear: External ear normal.     Left Ear: External ear normal.     Nose: Nose normal.     Mouth/Throat:     Mouth: Mucous membranes are moist.     Pharynx: Oropharynx is clear.  Eyes:     Extraocular Movements: Extraocular movements intact.     Conjunctiva/sclera: Conjunctivae normal.     Pupils: Pupils are equal, round, and reactive to light.  Cardiovascular:     Rate and Rhythm: Normal rate and regular rhythm.     Pulses: Normal pulses.     Heart sounds: Normal heart sounds. No murmur heard.   No friction rub. No gallop.  Pulmonary:     Effort: Pulmonary effort is normal. No respiratory distress.     Breath sounds: Normal breath sounds. No stridor. No wheezing or rhonchi.  Abdominal:     General: Bowel sounds are normal. There is no distension.     Palpations: Abdomen is  soft. There is no mass.     Tenderness: There is abdominal tenderness (mid lower and left lower quadrant pain). There is no right CVA tenderness or left CVA tenderness.  Musculoskeletal:        General: No swelling or tenderness. Normal range of motion.     Cervical back: Normal range of motion and neck supple.  Skin:    Capillary Refill: Capillary refill takes less than 2 seconds.     Coloration: Skin is not jaundiced.     Findings: No erythema, lesion or rash.  Neurological:     General: No focal deficit present.     Mental Status: She is alert and oriented to person, place, and time. Mental status is at baseline.  Psychiatric:        Mood and Affect: Mood normal.        Behavior: Behavior normal.    Assessment & Plan:   See Encounters Tab for problem based charting.  Patient seen with Dr. Letha Cape, MD

## 2021-05-31 ENCOUNTER — Other Ambulatory Visit (HOSPITAL_COMMUNITY): Payer: Self-pay

## 2021-05-31 ENCOUNTER — Telehealth: Payer: Self-pay | Admitting: *Deleted

## 2021-05-31 NOTE — Telephone Encounter (Signed)
Patient called in stating she does not know why Vit D and Lamictal were discontinued on her AVS from [place] on 9/28. States she takes both these meds and would like Vit D added back to her med list.. Spoke with Manuela Schwartz at [facility]. States patient has 2 refills on lamictal and will get one ready for her today.  Also, patient requesting a letter for work for "stool accomodation." States she works at [employer] and needs to be able to sit on a stool. States she had a letter for this in past but work is requiring a new one.

## 2021-06-04 ENCOUNTER — Other Ambulatory Visit (HOSPITAL_COMMUNITY): Payer: Self-pay

## 2021-06-04 MED FILL — Pantoprazole Sodium EC Tab 40 MG (Base Equiv): ORAL | 30 days supply | Qty: 60 | Fill #2 | Status: AC

## 2021-06-17 ENCOUNTER — Other Ambulatory Visit (HOSPITAL_COMMUNITY): Payer: Self-pay

## 2021-06-17 ENCOUNTER — Other Ambulatory Visit: Payer: Self-pay | Admitting: Student

## 2021-06-17 ENCOUNTER — Telehealth: Payer: Self-pay

## 2021-06-17 MED ORDER — TRIAMCINOLONE ACETONIDE 0.5 % EX OINT
1.0000 | TOPICAL_OINTMENT | Freq: Every day | CUTANEOUS | 5 refills | Status: DC
Start: 2021-06-17 — End: 2022-05-07
  Filled 2021-06-17: qty 30, 10d supply, fill #0

## 2021-06-17 MED ORDER — TRIAMCINOLONE ACETONIDE 0.1 % EX CREA
1.0000 "application " | TOPICAL_CREAM | Freq: Every day | CUTANEOUS | 3 refills | Status: DC
  Filled 2021-06-17: qty 45, 20d supply, fill #0

## 2021-06-17 NOTE — Telephone Encounter (Signed)
Please call pt back for cream.

## 2021-06-17 NOTE — Telephone Encounter (Signed)
Return pt's call - she's requesting a refill on  triamcinolone acetonide cream 0.1 %. States she uses this cream underneath her breasts. And Dr Sharon Seller was the last doctor to order this med about a year ago - states she ordered 2 tubes so it last a long time. Not on current med list. She uses [facility]. Thanks

## 2021-06-17 NOTE — Telephone Encounter (Signed)
Refilled patient's triamcinolone cream and ointment.

## 2021-06-17 NOTE — Telephone Encounter (Signed)
Thank you.  I spoke with patient and sent her refills to [facility].

## 2021-06-17 NOTE — Progress Notes (Signed)
Internal Medicine Clinic Attending  I saw and evaluated the patient.  I personally confirmed the key portions of the history and exam documented by Dr. Khan and I reviewed pertinent patient test results.  The assessment, diagnosis, and plan were formulated together and I agree with the documentation in the resident's note.  

## 2021-06-18 ENCOUNTER — Other Ambulatory Visit (HOSPITAL_COMMUNITY): Payer: Self-pay

## 2021-06-20 LAB — HM DIABETES EYE EXAM

## 2021-06-26 ENCOUNTER — Other Ambulatory Visit: Payer: Self-pay

## 2021-06-26 ENCOUNTER — Encounter: Payer: Self-pay | Admitting: Student

## 2021-06-26 ENCOUNTER — Encounter: Payer: Medicaid Other | Admitting: Student

## 2021-06-26 ENCOUNTER — Other Ambulatory Visit (HOSPITAL_COMMUNITY): Payer: Self-pay

## 2021-06-26 ENCOUNTER — Ambulatory Visit: Payer: Medicaid Other | Admitting: Student

## 2021-06-26 VITALS — BP 136/68 | HR 99 | Resp 20 | Ht 63.0 in | Wt 206.2 lb

## 2021-06-26 DIAGNOSIS — E669 Obesity, unspecified: Secondary | ICD-10-CM

## 2021-06-26 DIAGNOSIS — K2970 Gastritis, unspecified, without bleeding: Secondary | ICD-10-CM

## 2021-06-26 DIAGNOSIS — R5382 Chronic fatigue, unspecified: Secondary | ICD-10-CM

## 2021-06-26 DIAGNOSIS — Z23 Encounter for immunization: Secondary | ICD-10-CM | POA: Diagnosis not present

## 2021-06-26 DIAGNOSIS — R1084 Generalized abdominal pain: Secondary | ICD-10-CM | POA: Diagnosis not present

## 2021-06-26 DIAGNOSIS — F332 Major depressive disorder, recurrent severe without psychotic features: Secondary | ICD-10-CM

## 2021-06-26 DIAGNOSIS — Z0000 Encounter for general adult medical examination without abnormal findings: Secondary | ICD-10-CM | POA: Diagnosis not present

## 2021-06-26 DIAGNOSIS — K219 Gastro-esophageal reflux disease without esophagitis: Secondary | ICD-10-CM | POA: Diagnosis not present

## 2021-06-26 MED ORDER — PANTOPRAZOLE SODIUM 40 MG PO TBEC
40.0000 mg | DELAYED_RELEASE_TABLET | Freq: Two times a day (BID) | ORAL | 3 refills | Status: DC
  Filled 2021-06-26: qty 90, 45d supply, fill #0

## 2021-06-26 NOTE — Progress Notes (Signed)
   CC: Abdominal pain  HPI:  [name] is a 60 y.o. female with PMH as below who presents to clinic for follow-up on her abdominal pain. Please see problem based charting for evaluation, assessment and plan.  Past Medical History:  Diagnosis Date   Anxiety    Arthritis    Bipolar depression ([place]) 09/2015   Dizziness    Elevated blood pressure, situational    GERD (gastroesophageal reflux disease)    History of colon polyps    History of patellar fracture 2016   History of tibial fracture 2016   Hyperlipidemia    Night terrors    Night terrors, adult 09/2015   Pain of right knee after injury 03/10/2018   Post concussion syndrome 12/20/2019   Seasonal allergies 11/09/2020   Sleep apnea    doesnt wear c-pap    Review of Systems:  Constitutional: Negative for fever or fatigue Eyes: Negative for visual changes Respiratory: Negative for shortness of breath Cardiac: Negative for chest pain MSK: Negative for back pain Abdomen: Positive for abdominal pain and occasional constipation. Negative for nausea, vomiting, diarrhea. Psych: Positive for depressed mood  Physical Exam: General: Pleasant, well developed, obese elderly woman.  No acute distress. Cardiac: RRR. No murmurs, rubs or gallops. No LE edema Respiratory: Lungs CTAB. No wheezing or crackles. Abdominal: Obese. Soft. Nondistended. Mild tenderness to palpation of the lower abdomen bilaterally. No guarding. Normal bowel sounds. Skin: Warm, dry and intact without rashes or lesions Extremities: Atraumatic. Full ROM. Palpable radial and DP pulses. Neuro: A&O x 3. Moves all extremities Psych: Appropriate mood and affect.  Vitals:   06/26/21 1330  BP: 136/68  Pulse: 99  Resp: 20  SpO2: 98%  Weight: 206 lb 3.2 oz (93.5 kg)  Height: 5\' 3"  (1.6 m)     Assessment & Plan:   See Encounters Tab for problem based charting.  Patient discussed with Dr. Lockie Pares, MD, MPH

## 2021-06-26 NOTE — Patient Instructions (Addendum)
Thank you, Ms.Shilah Vines for allowing [place] to provide your care today. Today we discussed stomach pain, depression and weight gain.  I have restarted you on the Protonix to help with your gastritis.  Make sure to call GI for follow-up.  And continue to work with your psychiatrist and counselor to manage her depression.  I have referred you to the weight management clinic.  They will call you to schedule an appointment.  I have ordered the following medication/changed the following medications:  Start Protonix 40 mg twice daily before meals Continue MiraLAX and Senokot as needed for constipation.  My Chart Access: [email or web address]  Please follow-up in 3 months or as needed  Please make sure to arrive 15 minutes prior to your next appointment. If you arrive late, you may be asked to reschedule.    We look forward to seeing you next time. Please call our clinic at [phone] if you have any questions or concerns. The best time to call is Monday-Friday from 9am-4pm, but there is someone available 24/7. If after hours or the weekend, call the main hospital number and ask for the Internal Medicine Resident On-Call. If you need medication refills, please notify your pharmacy one week in advance and they will send [place] a request.   Thank you for letting us take part in your care. Wishing you the best!  Lacinda Axon, MD 06/26/2021, 2:13 PM IM Resident, PGY-2 [facility]al[facility] Milian 41:10

## 2021-06-27 ENCOUNTER — Encounter: Payer: Self-pay | Admitting: Student

## 2021-06-27 DIAGNOSIS — F32A Depression, unspecified: Secondary | ICD-10-CM

## 2021-06-27 DIAGNOSIS — E669 Obesity, unspecified: Secondary | ICD-10-CM | POA: Insufficient documentation

## 2021-06-27 DIAGNOSIS — E66811 Obesity, class 1: Secondary | ICD-10-CM | POA: Insufficient documentation

## 2021-06-27 HISTORY — DX: Depression, unspecified: F32.A

## 2021-06-27 NOTE — Assessment & Plan Note (Signed)
Recent EGD showed chronic gastritis on biopsy positive for H. pylori. Patient has completed treatment for H. pylori and has cleared the infection due to negative stool antigen test. She continues to have lower abdominal pain that is slightly improved with defecation.  Patient states she did not know she was supposed to be on a PPI. -- Restarted patient on Protonix 40 mg twice daily -- Patient advised to follow-up with GI

## 2021-06-27 NOTE — Assessment & Plan Note (Addendum)
This is likely a combination of her depression and sleep apnea.  Pending sleep study and patient on medication and CBT for her depression.  Patient works at [employer] as a Administrator, sports, passing out food samples.  She had a letter few years ago stating that she needs a stool to sit on her employer has been accommodating but now she needs a renewal of that letter without an end date. -- I will type letter stating that patient needs a stool to sit on at work indefinitely. Patient will come pick up letter in clinic.

## 2021-06-27 NOTE — Assessment & Plan Note (Addendum)
Weight is 206 pounds today. She has gained 8 pounds in the last month.  Patient reports that she has continued to gain weight over the last few months. States that her depression has contributed to this as she does not have the energy to do exercises. She has worked with [name] in the past but feels like she did not get much benefit from it. She is interested in referral to weight loss clinic. -- Referral to medical weight management clinic -- Advised to continue lifestyle modification with dietary changes and exercise

## 2021-06-27 NOTE — Assessment & Plan Note (Signed)
>>  ASSESSMENT AND PLAN FOR GENERALIZED OBESITY WRITTEN ON 06/27/2021  9:51 AM BY AMPONSAH, Charisse March, MD  Weight is 206 pounds today. She has gained 8 pounds in the last month.  Patient reports that she has continued to gain weight over the last few months. States that her depression has contributed to this as she does not have the energy to do exercises. She has worked with [name] in the past but feels like she did not get much benefit from it. She is interested in referral to weight loss clinic. -- Referral to medical weight management clinic -- Advised to continue lifestyle modification with dietary changes and exercise

## 2021-06-27 NOTE — Assessment & Plan Note (Signed)
Patient here to follow-up on her abdominal pain.  Patient has had a CT negative for abdominal pain for many months.  Pain is described as dull and worse in her left lower quadrant. She has tried some laxatives without significant relief. The pain improved slightly after she defecates. She has a history of chronic gastritis, diverticulosis and internal hemorrhoids. She reports occasional constipation and nausea but denies any vomiting, bloody stools, back pain or rectal pain. Patient reports she had endoscopy and colonoscopy with GI but has not followed up with them.  Patient's multiple abdominal complaints likely secondary to IBS versus her chronic gastritis. She will need a formal GI evaluation.   Plan: -- Restarted Protonix 40 mg twice daily --Continue on Senokot, MiraLAX as needed for constipation -- Advised to follow-up with GI

## 2021-06-27 NOTE — Assessment & Plan Note (Signed)
Received a flu shot today 

## 2021-06-27 NOTE — Assessment & Plan Note (Signed)
Patient reports she has been very depressed lately. States that her abdominal pain has been making her depressed because she has not received adequate answers about her pain. She thinks she might have some type of cancer that has not been diagnosed yet. Patient was reassured that based on abdominal imaging findings, endoscopy and colonoscopy, there is no evidence of GI malignancy. She does not have any symptoms of malignancy. The patient has been gaining weight which is also be a concern of hers. States she is followed by Malachi Paradise, psychiatry NP who prescribes her Lamictal and sees a counselor every few weeks. PHQ-9 today is 14.  Plan: -- Continue following psychiatry provider -- Continue Lamictal 100 mg daily -- Continue CBT with therapist

## 2021-06-28 NOTE — Progress Notes (Signed)
Internal Medicine Clinic Attending  Case discussed with Dr. Amponsah  At the time of the visit.  We reviewed the resident's history and exam and pertinent patient test results.  I agree with the assessment, diagnosis, and plan of care documented in the resident's note.  

## 2021-07-03 ENCOUNTER — Telehealth: Payer: Self-pay

## 2021-07-03 DIAGNOSIS — R300 Dysuria: Secondary | ICD-10-CM

## 2021-07-03 NOTE — Telephone Encounter (Signed)
Received TC from patient who c/o dysuria, urinary frequency, and noticed blood-tinged colored toilet paper when wiping after voiding.  She is wanting an appt for tomorrow to r/o UTI.  No appt's available.  Will send request to red team to see if patient can be added to lab schedule tomorrow for U/A collection and culture if indicated.  Patient instructed to call back tomorrow about UA, informed she can also ask if there were any cancellations.    If approved, please place orders and respond to triage. Thank you, SChaplin, RN,BSN

## 2021-07-04 ENCOUNTER — Encounter: Payer: Self-pay | Admitting: Dietician

## 2021-07-04 NOTE — Telephone Encounter (Signed)
Call to patient  No answer HIPPA compliant message left on recorder for return call

## 2021-07-05 ENCOUNTER — Encounter: Payer: Self-pay | Admitting: Internal Medicine

## 2021-07-05 NOTE — Telephone Encounter (Signed)
Pt was unable to rtc to provide urine specimen But also wanted to see MD Appt given for 11/7 0845 Dr Howie Ill.

## 2021-07-08 ENCOUNTER — Ambulatory Visit: Payer: Medicaid Other | Admitting: Internal Medicine

## 2021-07-08 ENCOUNTER — Other Ambulatory Visit (HOSPITAL_COMMUNITY): Payer: Self-pay

## 2021-07-08 VITALS — BP 120/79 | HR 93 | Temp 98.4°F | Wt 204.9 lb

## 2021-07-08 DIAGNOSIS — R300 Dysuria: Secondary | ICD-10-CM

## 2021-07-08 DIAGNOSIS — N390 Urinary tract infection, site not specified: Secondary | ICD-10-CM | POA: Diagnosis not present

## 2021-07-08 DIAGNOSIS — I700 Atherosclerosis of aorta: Secondary | ICD-10-CM | POA: Diagnosis not present

## 2021-07-08 LAB — POCT URINALYSIS DIPSTICK
Bilirubin, UA: NEGATIVE
Blood, UA: NEGATIVE
Glucose, UA: NEGATIVE
Ketones, UA: NEGATIVE
Nitrite, UA: POSITIVE
Protein, UA: NEGATIVE
Spec Grav, UA: 1.025 (ref 1.010–1.025)
Urobilinogen, UA: 0.2 E.U./dL
pH, UA: 6.5 (ref 5.0–8.0)

## 2021-07-08 MED ORDER — NITROFURANTOIN MONOHYD MACRO 100 MG PO CAPS
100.0000 mg | ORAL_CAPSULE | Freq: Two times a day (BID) | ORAL | 0 refills | Status: AC
Start: 2021-07-08 — End: 2021-07-13
  Filled 2021-07-08: qty 10, 5d supply, fill #0

## 2021-07-08 MED FILL — Atorvastatin Calcium Tab 80 MG (Base Equivalent): ORAL | 30 days supply | Qty: 30 | Fill #4 | Status: AC

## 2021-07-08 NOTE — Assessment & Plan Note (Addendum)
Seen on CT imaging 05/2021. Patient is currently taking atorvastatin 80 mg. ASCVD of 12.7%. LDL at goal for primary prevention. Lab Results  Component Value Date   CHOL 165 05/02/2021   HDL 62 05/02/2021   LDLCALC 87 05/02/2021   TRIG 89 05/02/2021   CHOLHDL 2.7 05/02/2021

## 2021-07-08 NOTE — Progress Notes (Signed)
Subjective:  CC: pain with urination  HPI:  [name] is a 60 y.o. female with a past medical history stated below and presents today for pain with urination.  She states that this started Nov 2nd.  Please see problem based assessment and plan for additional details.  Past Medical History:  Diagnosis Date   Anxiety    Arthritis    Bipolar depression ([place]) 09/2015   Depression 06/27/2021   Dizziness    Elevated blood pressure, situational    GERD (gastroesophageal reflux disease)    History of colon polyps    History of patellar fracture 2016   History of tibial fracture 2016   Hyperlipidemia    Night terrors    Night terrors, adult 09/2015   Pain of right knee after injury 03/10/2018   Post concussion syndrome 12/20/2019   Seasonal allergies 11/09/2020   Sleep apnea    doesnt wear c-pap    Current Outpatient Medications on File Prior to Visit  Medication Sig Dispense Refill   Accu-Chek FastClix Lancets MISC USE 1 TO CHECK BLOOD SUGAR UP TO 7 TIMES A WEEK. 102 each 3   acetaminophen (TYLENOL) 325 MG tablet TAKE 1 TO 2 TABLETS EVERY 6 HOURS AS NEEDED. 30 tablet 0   atorvastatin (LIPITOR) 80 MG tablet TAKE 1 TABLET (80 MG TOTAL) BY MOUTH DAILY. 30 tablet 11   chlorhexidine (PERIDEX) 0.12 % solution RINSE MOUTH WITH 15ML (1 CAPFUL) FOR 30 SECONDS AM AND PM AFTER TOOTHBRUSHING. EXPECTORATE AFTER RINSING, DO NOT SWALLOW 473 mL 1   dicyclomine (BENTYL) 10 MG capsule TAKE 1 CAPSULE BY MOUTH THREE TIMES DAILY AS NEEDED FOR SPASMS. (Patient not taking: No sig reported) 30 capsule 0   fluticasone (FLONASE) 50 MCG/ACT nasal spray Place 1 spray into both nostrils daily as needed for allergies or rhinitis 16 g 2   glucose blood test strip USE TO CHECK BLOOD GLUCOSE UP TO 7 TIMES A WEEK. (Patient taking differently: USE TO CHECK BLOOD GLUCOSE UP TO 7 TIMES A WEEK.) 100 strip 3   Hyoscyamine Sulfate SL (LEVSIN/SL) 0.125 MG SUBL Place 1 tablet under the tongue every 8 (eight) hours as  needed. 30 tablet 0   lamoTRIgine (LAMICTAL) 100 MG tablet Take 1 tablet (100 mg total) by mouth daily. 30 tablet 2   metFORMIN (GLUCOPHAGE-XR) 500 MG 24 hr tablet TAKE 1 TABLET (500 MG TOTAL) BY MOUTH DAILY WITH BREAKFAST. 90 tablet 3   ondansetron (ZOFRAN-ODT) 4 MG disintegrating tablet DISSOLVE 1 TABLET UNDER THE TONGUE EVERY 8 HOURS AS NEEDED FOR NAUSEA AND VOMITING. 30 tablet 1   pantoprazole (PROTONIX) 40 MG tablet Take 1 tablet (40 mg total) by mouth 2 (two) times daily before a meal. 90 tablet 3   prazosin (MINIPRESS) 1 MG capsule Take 2 capsules (2 mg total) by mouth at bedtime. 60 capsule 2   triamcinolone cream (KENALOG) 0.1 % Apply 1 application topically daily. Apply underneath breast as needed 45 g 3   triamcinolone ointment (KENALOG) 0.5 % Apply 1 application topically daily. Apply to hands as needed daily 30 g 5   No current facility-administered medications on file prior to visit.    Family History  Problem Relation Age of Onset   Hyperlipidemia Mother    Hypertension Mother    Colon polyps Mother    Heart disease Mother    Arthritis/Rheumatoid Father    Hyperlipidemia Maternal Aunt    Hypertension Maternal Aunt    Cancer Maternal Aunt  Colon polyps Maternal Grandmother    Colon cancer Neg Hx    Esophageal cancer Neg Hx    Stomach cancer Neg Hx    Rectal cancer Neg Hx     Social History   Socioeconomic History   Marital status: Single    Spouse name: Not on file   Number of children: 2   Years of education: 2 years of college   Highest education level: Not on file  Occupational History   Not on file  Tobacco Use   Smoking status: Former    Packs/day: 0.25    Years: 4.00    Pack years: 1.00    Types: Cigarettes    Quit date: 12/21/2017    Years since quitting: 3.5   Smokeless tobacco: Never  Vaping Use   Vaping Use: Never used  Substance and Sexual Activity   Alcohol use: Yes    Comment: special occasions   Drug use: No   Sexual activity: Yes     Birth control/protection: None    Comment: Celibate  Other Topics Concern   Not on file  Social History Narrative   Lives at home with her fiance.   Right-handed.   No daily use of caffeine.   Social Determinants of Health   Financial Resource Strain: Not on file  Food Insecurity: Not on file  Transportation Needs: Not on file  Physical Activity: Not on file  Stress: Not on file  Social Connections: Not on file  Intimate Partner Violence: Not on file    Review of Systems: ROS negative except for what is noted on the assessment and plan.  Objective:   Vitals:   07/08/21 1029  BP: 120/79  Pulse: 93  Temp: 98.4 F (36.9 C)  TempSrc: Oral  SpO2: 100%  Weight: 204 lb 14.4 oz (92.9 kg)    Physical Exam: Gen: A&O x3 and in no apparent distress, well appearing and nourished. HEENT:    Head - normocephalic, atraumatic.    Eye - visual acuity grossly intact, conjunctiva clear, sclera non-icteric, EOM intact.    Mouth - No obvious caries or periodontal disease. Neck: no masses or nodules, AROM intact. CV: RRR, no murmurs, S1/S2 presents  Resp: Clear to ascultation bilaterally  Abd: BS (+) x4, soft, non-tender abdomen, without hepatosplenomegaly or masses MSK: Grossly normal AROM and strength x4 extremities. Skin: good skin turgor, no rashes, unusual bruising, or prominent lesions.  Neuro: No focal deficits, grossly normal sensation and coordination.  Psych: Oriented x3 and responding appropriately. Intact memory, normal mood, judgement, affect, and insight.    Assessment & Plan:  See Encounters Tab for problem based charting.  Patient seen with Dr. Jay Schlichter Treyvone Chelf, D.O. [facility]  PGY-1 Pager: [phone]  Phone: [phone] Date 07/08/2021  Time 11:14 AM

## 2021-07-08 NOTE — Addendum Note (Signed)
Addended by: Lalla Brothers T on: 07/08/2021 02:40 PM   Modules accepted: Level of Service

## 2021-07-08 NOTE — Assessment & Plan Note (Addendum)
Patient presents due to pain with urination.  UA in clinic positive for leukocytes and nitrites.  Previous history of UTI in September that was diagnosed in the ED.  She presented due to dysuria and abdominal pain and was treated with course of Keflex and her symptoms resolved.  Denies fever, chills, or urinary frequency at this time.  Plan: UA dipstick completed  Macrobid 100 mg BID for 5 days Use Replens for vaginal dryness May consider referral to GYN if she continues to have UTIs.

## 2021-07-08 NOTE — Progress Notes (Signed)
Internal Medicine Clinic Attending  I saw and evaluated the patient.  I personally confirmed the key portions of the history and exam documented by Dr. Masters and I reviewed pertinent patient test results.  The assessment, diagnosis, and plan were formulated together and I agree with the documentation in the resident's note.  

## 2021-07-08 NOTE — Patient Instructions (Addendum)
[name], it was a pleasure seeing you today!  Today we discussed: Burning with urination- you have a bladder infection.  This happens when bacteria travels up into your bladder.  I am sending in an antibiotic called Macrobid to treat this.  You will take this twice a day for the next 5 days.   Weight management clinic-  the wait time for this is unfortunately 5-6 months.    I have ordered the following labs today:  Lab Orders  No laboratory test(s) ordered today     Tests ordered today:  We tested your urine today and it showed you have bacteria in it that caused a bladder infection.  Referrals ordered today:   Referral Orders  No referral(s) requested today     I have ordered the following medication/changed the following medications:   Stop the following medications: There are no discontinued medications.   Start the following medications: No orders of the defined types were placed in this encounter.    Follow-up: 2 months for lab work for diabetes.  Please make sure to arrive 15 minutes prior to your next appointment. If you arrive late, you may be asked to reschedule.   We look forward to seeing you next time. Please call our clinic at [phone] if you have any questions or concerns. The best time to call is Monday-Friday from 9am-4pm, but there is someone available 24/7. If after hours or the weekend, call the main hospital number and ask for the Internal Medicine Resident On-Call. If you need medication refills, please notify your pharmacy one week in advance and they will send [place] a request.  Thank you for letting us take part in your care. Wishing you the best!  Thank you, Dr. Hel[facility]

## 2021-07-17 ENCOUNTER — Other Ambulatory Visit: Payer: Medicaid Other

## 2021-07-17 ENCOUNTER — Encounter: Payer: Self-pay | Admitting: Gastroenterology

## 2021-07-17 ENCOUNTER — Ambulatory Visit (INDEPENDENT_AMBULATORY_CARE_PROVIDER_SITE_OTHER): Payer: Medicaid Other | Admitting: Gastroenterology

## 2021-07-17 VITALS — BP 120/70 | HR 88 | Ht 61.75 in | Wt 204.2 lb

## 2021-07-17 DIAGNOSIS — K9049 Malabsorption due to intolerance, not elsewhere classified: Secondary | ICD-10-CM

## 2021-07-17 DIAGNOSIS — K219 Gastro-esophageal reflux disease without esophagitis: Secondary | ICD-10-CM | POA: Diagnosis not present

## 2021-07-17 DIAGNOSIS — Z8619 Personal history of other infectious and parasitic diseases: Secondary | ICD-10-CM

## 2021-07-17 DIAGNOSIS — K581 Irritable bowel syndrome with constipation: Secondary | ICD-10-CM

## 2021-07-17 MED ORDER — IBGARD 90 MG PO CPCR: ORAL_CAPSULE | ORAL | Status: DC

## 2021-07-17 MED ORDER — PANTOPRAZOLE SODIUM 40 MG PO TBEC: 40.0000 mg | DELAYED_RELEASE_TABLET | Freq: Every day | ORAL | Status: DC

## 2021-07-17 MED ORDER — LINACLOTIDE 72 MCG PO CAPS: 72.0000 ug | ORAL_CAPSULE | Freq: Every day | ORAL | 0 refills | Status: DC

## 2021-07-17 NOTE — Patient Instructions (Addendum)
If you are age 61 or older, your body mass index should be between 23-30. Your Body mass index is 37.66 kg/m. If this is out of the aforementioned range listed, please consider follow up with your Primary Care Provider.  If you are age 44 or younger, your body mass index should be between 19-25. Your Body mass index is 37.66 kg/m. If this is out of the aformentioned range listed, please consider follow up with your Primary Care Provider.   ________________________________________________________  The  GI providers would like to encourage you to use [facility] to communicate with providers for non-urgent requests or questions.  Due to long hold times on the telephone, sending your provider a message by [facility] may be a faster and more efficient way to get a response.  Please allow 48 business hours for a response.  Please remember that this is for non-urgent requests.  _______________________________________________________  Please go to the lab in the basement of our building to have lab work done as you leave today. Hit "B" for basement when you get on the elevator.  When the doors open the lab is on your left.  We will call you with the results. Thank you.  We have given you samples of the following medication to take: Linzess 72 mcg - Take daily before breakfast.  If this is not effective, try Linzess 145 mcg - daily before breakfast. Hold your other laxatives while trying Linzess.  As an alternative you could take Miralax once to twice daily  Continue Levsin.  Please purchase the following medications over the counter and take as directed: IBGard - use as directed as needed  Decrease Protonix to once daily and then stop or take as needed.  Thank you for entrusting me with your care and for choosing [facility], Dr. Bracey Cellar

## 2021-07-17 NOTE — Progress Notes (Signed)
HPI :  60 year old female here for a follow-up visit for constipation, abdominal pain, GERD, history of H. pylori.  Recall that she was seen in March of this year by Carl Best for abdominal pain, postprandial discomfort, nausea vomiting.  She also complained of lower abdominal pain.  She underwent an EGD and colonoscopy with me on May 13 as outlined:  EGD 01/11/21 -  - The exam of the esophagus was otherwise normal. - The entire examined stomach was normal. Biopsies were taken with a cold forceps for Helicobacter pylori testing. - Diffuse nodular mucosa was found in the duodenal bulb. Biopsies were taken with a cold forceps for histology. - The exam of the duodenum was otherwise normal.  Colonoscopy 01/11/21 - The perianal and digital rectal examinations were normal. - The terminal ileum appeared normal. - Many medium-mouthed diverticula were found in the entire colon (highest burden in ascending colon). - A diminutive polyp was found in the recto-sigmoid colon. The polyp was sessile. The polyp was removed with a cold snare. Resection and retrieval were complete. - A 3 mm polyp was found in the rectum. The polyp was sessile. The polyp was removed with a cold snare. Resection and retrieval were complete. - Internal hemorrhoids were found during retroflexion. - The exam was otherwise without abnormality.  1. Surgical [P], duodenal nodules - PEPTIC DUODENITIS. - BRUNNER GLAND HYPERPLASIA. 2. Surgical [P], gastric antrum and gastric body - CHRONIC ACTIVE GASTRITIS WITH HELICOBACTER PYLORI [WARTHIN-STARRY STAIN PERFORMED]. 3. Surgical [P], colon, rectum, recto-sigmoid, polyp (2) - TUBULAR ADENOMA, NEGATIVE FOR HIGH GRADE DYSPLASIA (X1). - HYPERPLASTIC POLYP (X1).   She was treated for H. pylori with 14 days worth of Amoxicillin 1gm BID, Clarithromycin 500mg  BID Flagyl 500mg  BID, too her protonix 40mg  BID for 14 days.  She states she tolerated this but it was very difficult  on her stomach but she did complete the full therapy.  She had a H pylori stool antigen 03/22/21 - negative.  Intervally since have last seen her she has had 3 different CT scans for mid and lower abdominal pain, none of which have shown clear cause for her symptoms.  She states at baseline she has constipation that has bothered her.  She is taking what she thinks is a stool softener twice daily, but is not sure the name of what is called.  She also takes MiraLAX as needed on top of this.  On this regimen she states she is moving her bowels usually once a day.  She continues to have some lower abdominal discomfort that comes and goes.  She does think that a bowel movement can reliably relieve the symptoms and she feels much better after she passes her stool.  She is concerned that she has clear food triggers that bother her.  Specifically eating steak, beef, pork, she states she has abdominal discomfort, sweats, feels like she wants to pass out and has abdominal discomfort.  She has no problems with chicken or fish.  She denies any blood in her stools.  She does have a history of multiple tick bites in the past.  She has been using hyoscyamine as needed for pain which she states helps.  She has been taking Protonix 40 mg twice daily since she had her upper tract symptoms at her last visit.  She has continued to take this but states she has no heartburn, is eating okay otherwise, a lot of her upper tract symptoms otherwise abated.  Otherwise she  seems to be doing better than the last time we saw her several months ago in regards to these issues.  Prior work-up: CT abdomen / pelvis 11/16/20 - IMPRESSION: 1. No acute findings in the abdomen or pelvis. Specifically, no findings to explain the patient's history of left lower quadrant pain with nausea and vomiting. 2. Diffuse colonic diverticulosis without diverticulitis. 3. Aortic Atherosclerosis (ICD10-I70.0).  CT renal stone study  04/29/21 IMPRESSION: Extensive colonic diverticulosis without evidence of diverticulitis. No acute intra-abdominal or intrapelvic abnormalities. Aortic Atherosclerosis (ICD10-I70.0).  CT abdomen / pelvis with contrast 05/26/21: IMPRESSION: 1. No suspicious CT etiology for LEFT flank pain identified. 2. Moderate to large colonic stool burden.  Past Medical History:  Diagnosis Date   Anxiety    Arthritis    Bipolar depression ([place]) 09/2015   Depression 06/27/2021   Dizziness    Elevated blood pressure, situational    GERD (gastroesophageal reflux disease)    History of colon polyps    History of patellar fracture 2016   History of tibial fracture 2016   Hyperlipidemia    Night terrors    Night terrors, adult 09/2015   Pain of right knee after injury 03/10/2018   Post concussion syndrome 12/20/2019   Seasonal allergies 11/09/2020   Sleep apnea    doesnt wear c-pap     Past Surgical History:  Procedure Laterality Date   ABDOMINAL HYSTERECTOMY     CHOLECYSTECTOMY     CLOSED REDUCTION TIBIAL FRACTURE Right 2003   COLONOSCOPY  2016   [place]  2022   Family History  Problem Relation Age of Onset   Hyperlipidemia Mother    Hypertension Mother    Colon polyps Mother    Heart disease Mother    Arthritis/Rheumatoid Father    Hyperlipidemia Maternal Aunt    Hypertension Maternal Aunt    Cancer Maternal Aunt    Colon polyps Maternal Grandmother    Colon cancer Neg Hx    Esophageal cancer Neg Hx    Stomach cancer Neg Hx    Rectal cancer Neg Hx    Social History   Tobacco Use   Smoking status: Former    Packs/day: 0.25    Years: 4.00    Pack years: 1.00    Types: Cigarettes    Quit date: 12/21/2017    Years since quitting: 3.5   Smokeless tobacco: Never  Vaping Use   Vaping Use: Never used  Substance Use Topics   Alcohol use: Yes    Comment: special occasions   Drug use: No   Current Outpatient Medications  Medication Sig Dispense  Refill   Accu-Chek FastClix Lancets MISC USE 1 TO CHECK BLOOD SUGAR UP TO 7 TIMES A WEEK. 102 each 3   acetaminophen (TYLENOL) 325 MG tablet TAKE 1 TO 2 TABLETS EVERY 6 HOURS AS NEEDED. 30 tablet 0   atorvastatin (LIPITOR) 80 MG tablet TAKE 1 TABLET (80 MG TOTAL) BY MOUTH DAILY. 30 tablet 11   fluticasone (FLONASE) 50 MCG/ACT nasal spray Place 1 spray into both nostrils daily as needed for allergies or rhinitis 16 g 2   glucose blood test strip USE TO CHECK BLOOD GLUCOSE UP TO 7 TIMES A WEEK. (Patient taking differently: USE TO CHECK BLOOD GLUCOSE UP TO 7 TIMES A WEEK.) 100 strip 3   lamoTRIgine (LAMICTAL) 100 MG tablet Take 1 tablet (100 mg total) by mouth daily. 30 tablet 2   metFORMIN (GLUCOPHAGE-XR) 500 MG 24 hr tablet TAKE  1 TABLET (500 MG TOTAL) BY MOUTH DAILY WITH BREAKFAST. 90 tablet 3   pantoprazole (PROTONIX) 40 MG tablet Take 1 tablet (40 mg total) by mouth 2 (two) times daily before a meal. 90 tablet 3   prazosin (MINIPRESS) 1 MG capsule Take 2 capsules (2 mg total) by mouth at bedtime. 60 capsule 2   triamcinolone cream (KENALOG) 0.1 % Apply 1 application topically daily. Apply underneath breast as needed 45 g 3   triamcinolone ointment (KENALOG) 0.5 % Apply 1 application topically daily. Apply to hands as needed daily 30 g 5   Hyoscyamine Sulfate SL (LEVSIN/SL) 0.125 MG SUBL Place 1 tablet under the tongue every 8 (eight) hours as needed. (Patient not taking: Reported on 07/17/2021) 30 tablet 0   ondansetron (ZOFRAN-ODT) 4 MG disintegrating tablet DISSOLVE 1 TABLET UNDER THE TONGUE EVERY 8 HOURS AS NEEDED FOR NAUSEA AND VOMITING. (Patient not taking: Reported on 07/17/2021) 30 tablet 1   No current facility-administered medications for this visit.   Allergies  Allergen Reactions   Depakote [Divalproex Sodium] Other (See Comments)    Hair fell out   Ibuprofen Nausea And Vomiting   Prednisone     Constipation, nervousness   Latex Rash     Review of Systems: All systems  reviewed and negative except where noted in HPI.   Lab Results  Component Value Date   WBC 6.8 05/26/2021   HGB 12.3 05/26/2021   HCT 39.1 05/26/2021   MCV 84.8 05/26/2021   PLT 311 05/26/2021    Lab Results  Component Value Date   CREATININE 0.52 05/26/2021   BUN 13 05/26/2021   NA 139 05/26/2021   K 4.0 05/26/2021   CL 103 05/26/2021   CO2 26 05/26/2021     Lab Results  Component Value Date   ALT 31 05/26/2021   AST 23 05/26/2021   ALKPHOS 69 05/26/2021   BILITOT 0.5 05/26/2021     Physical Exam: BP 120/70 (BP Location: Left Arm, Patient Position: Sitting, Cuff Size: Normal)   Pulse 88   Ht 5' 1.75" (1.568 m)   Wt 204 lb 4 oz (92.6 kg)   BMI 37.66 kg/m  Constitutional: Pleasant,well-developed, female in no acute distress. Abdominal: Soft, nondistended, nontender. There are no masses palpable.  Extremities: no edema Neurological: Alert and oriented to person place and time. Skin: Skin is warm and dry. No rashes noted. Psychiatric: Normal mood and affect. Behavior is normal.   ASSESSMENT AND PLAN: 60 year old female here for reassessment of following:  IBS C Food intolerance History H. Pylori GERD  I reassured the patient in regards to her symptoms as she is concerned she has cancer or other concerning process ongoing.  She has had an EGD and colonoscopy without any high risk lesions.  She has had 3 CT scans this year showing no concerning findings.  She has had H. pylori treated and has been eradicated.  I think she more than likely has a functional bowel disorder with IBS-C.  We discussed this for a bit.  It is unclear what she is taking for her baseline bowel regimen, she thinks over-the-counter stool softener but needing to combine it with MiraLAX.  We discussed other options, gave her some free samples of Linzess, both 72.5 mcg and 145 mcg dosing to see if she would prefer either of those.  If she does not like taking the Linzess she can use MiraLAX every  day or increase to twice daily to see if that helps.  She can continue Levsin if that helps her stomach but counseled it could make her constipation worse.  She could also try some IBgard as needed for relief of her abdominal discomfort.  She definitely has clear food intolerance to meat and pork, she also has a history of tick bites another odd symptoms when she eats meat, I do think reasonable to test for alpha gal and we discussed what that meant.  She is agreeable to proceed for testing for that.  Otherwise we discussed that her upper tract symptoms have improved.  We will try to reduce her Protonix to once daily dosing for few weeks and see how she does with that.  If she continues to feel well she can stop it and use Protonix as needed moving forward.  I otherwise provided reassurance regarding her diagnosis and imaging thus far.  Plan: - Alpha gal IgE test - Linzess trial - both 72.8mcg and 113mcg to see if this works - hold her other laxatives while trying this. Other option would be to use miralax daily to BID - continue Levsin if that helps but can make contipation worse - try some IB gard OTC PRN - reduce protonix to once daily for a few weeks and then stop if tolerated and use PRN - provided reassurance about her diagnosis and reassuring imaging  Jolly Mango, MD [facility]

## 2021-07-23 LAB — O215-IGE ALPHA-GAL: O215-IgE Alpha-Gal: <0.1 kU/L

## 2021-07-29 ENCOUNTER — Other Ambulatory Visit (HOSPITAL_COMMUNITY): Payer: Self-pay

## 2021-07-29 MED ORDER — LAMOTRIGINE 100 MG PO TABS
100.0000 mg | ORAL_TABLET | Freq: Every day | ORAL | 3 refills | Status: DC
Start: 2021-07-29 — End: 2021-10-17
  Filled 2021-07-29 – 2021-08-16 (×2): qty 30, 30d supply, fill #0
  Filled 2021-09-24: qty 30, 30d supply, fill #1

## 2021-07-29 MED ORDER — PRAZOSIN HCL 1 MG PO CAPS
2.0000 mg | ORAL_CAPSULE | Freq: Every day | ORAL | 3 refills | Status: DC
  Filled 2021-07-29: qty 60, 30d supply, fill #0

## 2021-07-31 ENCOUNTER — Telehealth: Payer: Self-pay | Admitting: Gastroenterology

## 2021-07-31 ENCOUNTER — Institutional Professional Consult (permissible substitution): Payer: Medicaid Other | Admitting: Neurology

## 2021-07-31 ENCOUNTER — Other Ambulatory Visit (HOSPITAL_COMMUNITY): Payer: Self-pay

## 2021-07-31 ENCOUNTER — Encounter: Payer: Self-pay | Admitting: Neurology

## 2021-07-31 ENCOUNTER — Ambulatory Visit: Payer: Medicaid Other | Admitting: Neurology

## 2021-07-31 VITALS — BP 105/71 | HR 94 | Ht 62.0 in | Wt 206.0 lb

## 2021-07-31 DIAGNOSIS — R519 Headache, unspecified: Secondary | ICD-10-CM | POA: Diagnosis not present

## 2021-07-31 DIAGNOSIS — G4733 Obstructive sleep apnea (adult) (pediatric): Secondary | ICD-10-CM

## 2021-07-31 DIAGNOSIS — E669 Obesity, unspecified: Secondary | ICD-10-CM

## 2021-07-31 DIAGNOSIS — G4719 Other hypersomnia: Secondary | ICD-10-CM | POA: Diagnosis not present

## 2021-07-31 MED ORDER — LINACLOTIDE 145 MCG PO CAPS
145.0000 ug | ORAL_CAPSULE | Freq: Every day | ORAL | 3 refills | Status: DC
  Filled 2021-07-31: qty 30, 30d supply, fill #0
  Filled 2021-10-23: qty 30, 30d supply, fill #1
  Filled 2021-12-13: qty 30, 30d supply, fill #2
  Filled 2022-01-22: qty 30, 30d supply, fill #3

## 2021-07-31 NOTE — Telephone Encounter (Signed)
Linzess 145 mcg sent to pharmacy.

## 2021-07-31 NOTE — Progress Notes (Signed)
Subjective:    Patient ID: Meghan Wilson is a 60 y.o. female.  HPI    Star Age, MD, PhD [facility] [address], [address] [place], [place] [address]  Dear Drs. Humphrey Rolls and Gilbert,     I saw your patient, [name], upon your kind request, in my sleep clinic today for initial consultation of her sleep disorder, in particular, evaluation of her prior diagnosis of obstructive sleep apnea.  The patient is unaccompanied today.  As you know, [name] is a 60 year old right-handed woman with an underlying medical history of reflux disease, arthritis, right leg injury with hardware in place, diabetes, hyperlipidemia, allergies, mood disorder, and obesity, who reports snoring and excessive daytime somnolence.  I reviewed your office note from 05/06/2021.  She was previously diagnosed with obstructive sleep apnea and has CPAP in the past.  She is currently not on CPAP therapy.  Epworth sleepiness score is 19, fatigue severity score is 56 out of 63.  She was diagnosed with what sounds like severe obstructive sleep apnea over 20 years ago.  She no longer has a CPAP machine and was not able to tolerate it back then, reports difficulty with the mask in particular as it was a fullface mask and she felt claustrophobic.  She is willing to get retested and consider treatment again.  She reports feeling sleepy when sedentary, even at work.  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  She has been followed by psychiatry for her mood disorder and is currently on Lamictal and prazosin.  She has a TV in her bedroom and does watch it at night and has it on a timer.  She lives with her son who is 45 years old.  She also has a 25 year old daughter.  She has grandchildren and 3 great-grandchildren.  She does not have any pets at the house.   Bedtime is generally somewhere between 9 and midnight, it takes her a while to fall asleep and she likes to play games on her  phone.  Rise time is around 6 AM.  She does not have night to night nocturia but has woken up with a headache occasionally.  She is followed by orthopedics for her arthritis and will need a knee replacement on the right side, which is planned for January 2023, with under Dr. Rex Kras.  She does not drink caffeine daily.  She drinks alcohol occasionally, she quit smoking some 3 years ago.   Her Past Medical History Is Significant For: Past Medical History:  Diagnosis Date   Anxiety    Arthritis    Bipolar depression ([place]) 09/2015   Depression 06/27/2021   Dizziness    Elevated blood pressure, situational    GERD (gastroesophageal reflux disease)    History of colon polyps    History of patellar fracture 2016   History of tibial fracture 2016   Hyperlipidemia    Night terrors    Night terrors, adult 09/2015   Pain of right knee after injury 03/10/2018   Post concussion syndrome 12/20/2019   Seasonal allergies 11/09/2020   Sleep apnea    doesnt wear c-pap    Her Past Surgical History Is Significant For: Past Surgical History:  Procedure Laterality Date   ABDOMINAL HYSTERECTOMY     CHOLECYSTECTOMY     CLOSED REDUCTION TIBIAL FRACTURE Right 2003   COLONOSCOPY  2016   [place]  2022    Her  Family History Is Significant For: Family History  Problem Relation Age of Onset   Hyperlipidemia Mother    Hypertension Mother    Colon polyps Mother    Heart disease Mother    Arthritis/Rheumatoid Father    Hyperlipidemia Maternal Aunt    Hypertension Maternal Aunt    Cancer Maternal Aunt    Colon polyps Maternal Grandmother    Colon cancer Neg Hx    Esophageal cancer Neg Hx    Stomach cancer Neg Hx    Rectal cancer Neg Hx    Sleep apnea Neg Hx     Her Social History Is Significant For: Social History   Socioeconomic History   Marital status: Single    Spouse name: Not on file   Number of children: 2   Years of education: 2 years of college   Highest  education level: Not on file  Occupational History   Not on file  Tobacco Use   Smoking status: Former    Packs/day: 0.25    Years: 4.00    Pack years: 1.00    Types: Cigarettes    Quit date: 12/21/2017    Years since quitting: 3.6   Smokeless tobacco: Never  Vaping Use   Vaping Use: Never used  Substance and Sexual Activity   Alcohol use: Yes    Comment: special occasions   Drug use: No   Sexual activity: Yes    Birth control/protection: None    Comment: Celibate  Other Topics Concern   Not on file  Social History Narrative   Lives at home with her fiance.   Right-handed.   No daily use of caffeine.   Social Determinants of Health   Financial Resource Strain: Not on file  Food Insecurity: Not on file  Transportation Needs: Not on file  Physical Activity: Not on file  Stress: Not on file  Social Connections: Not on file    Her Allergies Are:  Allergies  Allergen Reactions   Depakote [Divalproex Sodium] Other (See Comments)    Hair fell out   Ibuprofen Nausea And Vomiting   Prednisone     Constipation, nervousness   Latex Rash  :   Her Current Medications Are:  Outpatient Encounter Medications as of 07/31/2021  Medication Sig   Accu-Chek FastClix Lancets MISC USE 1 TO CHECK BLOOD SUGAR UP TO 7 TIMES A WEEK.   acetaminophen (TYLENOL) 325 MG tablet TAKE 1 TO 2 TABLETS EVERY 6 HOURS AS NEEDED.   atorvastatin (LIPITOR) 80 MG tablet TAKE 1 TABLET (80 MG TOTAL) BY MOUTH DAILY.   Cholecalciferol (VITAMIN D3 PO) Take by mouth daily.   Cyanocobalamin (B-12 PO) Take by mouth daily.   fluticasone (FLONASE) 50 MCG/ACT nasal spray Place 1 spray into both nostrils daily as needed for allergies or rhinitis   glucose blood test strip USE TO CHECK BLOOD GLUCOSE UP TO 7 TIMES A WEEK. (Patient taking differently: USE TO CHECK BLOOD GLUCOSE UP TO 7 TIMES A WEEK.)   Hyoscyamine Sulfate SL (LEVSIN/SL) 0.125 MG SUBL Place 1 tablet under the tongue every 8 (eight) hours as needed.    lamoTRIgine (LAMICTAL) 100 MG tablet Take 1 tablet by mouth once a day   linaclotide (LINZESS) 72 MCG capsule Take 1-2 capsules (72-145 mcg total) by mouth daily before breakfast. 58mcg: Lot: [id number], Exp: 12-23; 187mcg: Lot [id number] Exp:01-2022   metFORMIN (GLUCOPHAGE-XR) 500 MG 24 hr tablet TAKE 1 TABLET (500 MG TOTAL) BY MOUTH DAILY WITH BREAKFAST.   ondansetron (ZOFRAN-ODT) 4  MG disintegrating tablet DISSOLVE 1 TABLET UNDER THE TONGUE EVERY 8 HOURS AS NEEDED FOR NAUSEA AND VOMITING. (Patient taking differently: as needed.)   pantoprazole (PROTONIX) 40 MG tablet Take 1 tablet (40 mg total) by mouth daily. Try to taper off and then take only as needed   Peppermint Oil (IBGARD) 90 MG CPCR Take as directed   prazosin (MINIPRESS) 1 MG capsule Take 2 capsules (2 mg total) by mouth at bedtime.   prazosin (MINIPRESS) 1 MG capsule Take 2 capsules by mouth at bedtime   triamcinolone cream (KENALOG) 0.1 % Apply 1 application topically daily. Apply underneath breast as needed   triamcinolone ointment (KENALOG) 0.5 % Apply 1 application topically daily. Apply to hands as needed daily   UNABLE TO FIND daily. Med Name:Allergy Pill Generic  (OTC) (3 PILLS ) daily   No facility-administered encounter medications on file as of 07/31/2021.  :   Review of Systems:  Out of a complete 14 point review of systems, all are reviewed and negative with the exception of these symptoms as listed below:  Review of Systems  Neurological:        Pt is here for sleep consult . Pt had a Sleep study done before 20+ years ago. Pt states report said she stopped breathing 60 times during the night. Pt did have a CPAP machine but lost it because she has moved a lot . Pt states she snores, has fatigue during the day, have headaches some mornings, and denies Hypertension  FSS:56 ESS:19   Objective:  Neurological Exam  Physical Exam Physical Examination:   Vitals:   07/31/21 1044  BP: 105/71  Pulse: 94    General  Examination: The patient is a very pleasant 60 y.o. female in no acute distress. She appears well-developed and well-nourished and well groomed.   HEENT: Normocephalic, atraumatic, pupils are equal, round and reactive to light, extraocular tracking is good without limitation to gaze excursion or nystagmus noted. Hearing is grossly intact. Face is symmetric with normal facial animation. Speech is clear with no dysarthria noted. There is no hypophonia. There is no lip, neck/head, jaw or voice tremor. Neck is supple with full range of passive and active motion. There are no carotid bruits on auscultation. Oropharynx exam reveals: mild mouth dryness, good dental hygiene and small airway management, tonsils on the smaller side, Mallampati class III.  Neck circumference of 14 1/4 inches.  She has a minimal to mild overbite.  Tongue protrudes centrally and palate elevates symmetrically.  Chest: Clear to auscultation without wheezing, rhonchi or crackles noted.  Heart: S1+S2+0, regular and normal without murmurs, rubs or gallops noted.   Abdomen: Soft, non-tender and non-distended.  Extremities: There is trace pitting edema in the distal lower extremities bilaterally.   Skin: Warm and dry without trophic changes noted.   Musculoskeletal: exam reveals no obvious joint deformities, with the exception of brace around the right knee.  She has decreased range of motion in the right knee.   Neurologically:  Mental status: The patient is awake, alert and oriented in all 4 spheres. Her immediate and remote memory, attention, language skills and fund of knowledge are appropriate. There is no evidence of aphasia, agnosia, apraxia or anomia. Speech is clear with normal prosody and enunciation. Thought process is linear. Mood is normal and affect is normal.  Cranial nerves II - XII are as described above under HEENT exam.  Motor exam: Normal bulk, strength and tone is noted. There is no tremor, fine  motor skills and  coordination: grossly intact.  Cerebellar testing: No dysmetria or intention tremor. There is no truncal or gait ataxia.  Sensory exam: intact to light touch in the upper and lower extremities.  Gait, station and balance: She stands without difficulty and walks without gait.   Assessment and Plan:   In summary, Safiatou Islam is a very pleasant 60 y.o.-year old female with an underlying medical history of reflux disease, diabetes, hyperlipidemia, allergies, arthritis, mood disorder, and obesity, whose history and physical exam are concerning for obstructive sleep apnea (OSA).  She carries a prior diagnosis of obstructive sleep apnea, possibly in the severe range.  She has not been on CPAP therapy in years and could not tolerate it in the past but is willing to get rechecked and consider positive airway pressure treatment again. I had a long chat with the patient about my findings and the diagnosis of OSA, its prognosis and treatment options. We talked about medical treatments, surgical interventions and non-pharmacological approaches. I explained in particular the risks and ramifications of untreated moderate to severe OSA, especially with respect to developing cardiovascular disease down the Road, including congestive heart failure, difficult to treat hypertension, cardiac arrhythmias, or stroke. Even type 2 diabetes has, in part, been linked to untreated OSA. Symptoms of untreated OSA include daytime sleepiness, memory problems, mood irritability and mood disorder such as depression and anxiety, lack of energy, as well as recurrent headaches, especially morning headaches. We talked about trying to maintain a healthy lifestyle in general, as well as the importance of weight control. We also talked about the importance of good sleep hygiene. I recommended the following at this time: sleep study.  I have outlined the difference between a laboratory attended sleep study versus home sleep test.  I explained  the sleep test procedure to the patient and also outlined possible surgical and non-surgical treatment options of OSA, including the use of a custom-made dental device (which would require a referral to a specialist dentist or oral surgeon), upper airway surgical options, such as traditional UPPP or a novel less invasive surgical option in the form of Inspire hypoglossal nerve stimulation (which would involve a referral to an ENT surgeon). I also explained the CPAP treatment option to the patient, who indicated that she would be willing to try CPAP if the need arises. I explained the importance of being compliant with PAP treatment, not only for insurance purposes but primarily to improve Her symptoms, and for the patient's long term health benefit, including to reduce Her cardiovascular risks. I answered all her questions today and the patient was in agreement. I plan to see her back after the sleep study is completed and encouraged her to call with any interim questions, concerns, problems or updates.   Thank you very much for allowing me to participate in the care of this nice patient. If I can be of any further assistance to you please do not hesitate to call me at [phone].  Sincerely,   Star Age, MD, PhD

## 2021-07-31 NOTE — Patient Instructions (Signed)
It was nice to meet you today. Based on your symptoms and your exam I believe you are at risk for obstructive sleep apnea (aka OSA), and I think we should proceed with a sleep study to determine whether you do or do not have OSA and how severe it is. Even, if you have mild OSA, I may want you to consider treatment with CPAP, as treatment of even borderline or mild sleep apnea can result and improvement of symptoms such as sleep disruption, daytime sleepiness, nighttime bathroom breaks, restless leg symptoms, improvement of headache syndromes, even improved mood disorder.   As explained, an attended sleep study meaning you get to stay overnight in the sleep lab, lets us monitor sleep-related behaviors such as sleep talking and leg movements in sleep, in addition to monitoring for sleep apnea.  A home sleep test is a screening tool for sleep apnea only, and unfortunately does not help with any other sleep-related diagnoses.  Please remember, the long-term risks and ramifications of untreated moderate to severe obstructive sleep apnea are: increased Cardiovascular disease, including congestive heart failure, stroke, difficult to control hypertension, treatment resistant obesity, arrhythmias, especially irregular heartbeat commonly known as A. Fib. (atrial fibrillation); even type 2 diabetes has been linked to untreated OSA.   Sleep apnea can cause disruption of sleep and sleep deprivation in most cases, which, in turn, can cause recurrent headaches, problems with memory, mood, concentration, focus, and vigilance. Most people with untreated sleep apnea report excessive daytime sleepiness, which can affect their ability to drive. Please do not drive if you feel sleepy. Patients with sleep apnea can also develop difficulty initiating and maintaining sleep (aka insomnia).   Having sleep apnea may increase your risk for other sleep disorders, including involuntary behaviors sleep such as sleep terrors, sleep  talking, sleepwalking.    Having sleep apnea can also increase your risk for restless leg syndrome and leg movements at night.   Please note that untreated obstructive sleep apnea may carry additional perioperative morbidity. Patients with significant obstructive sleep apnea (typically, in the moderate to severe degree) should receive, if possible, perioperative PAP (positive airway pressure) therapy and the surgeons and particularly the anesthesiologists should be informed of the diagnosis and the severity of the sleep disordered breathing.   I will likely see you back after your sleep study to go over the test results and where to go from there. We will call you after your sleep study to advise about the results (most likely, you will hear from Megan, my nurse) and to set up an appointment at the time, as necessary.    Our sleep lab administrative assistant will call you to schedule your sleep study and give you further instructions, regarding the check in process for the sleep study, arrival time, what to bring, when you can expect to leave after the study, etc., and to answer any other logistical questions you may have. If you don't hear back from her by about 2 weeks from now, please feel free to call her direct line at [phone] or you can call our general clinic number, or email us through My Chart.    

## 2021-08-06 ENCOUNTER — Other Ambulatory Visit (HOSPITAL_COMMUNITY): Payer: Self-pay

## 2021-08-06 MED FILL — Atorvastatin Calcium Tab 80 MG (Base Equivalent): ORAL | 30 days supply | Qty: 30 | Fill #5 | Status: AC

## 2021-08-08 ENCOUNTER — Other Ambulatory Visit (HOSPITAL_COMMUNITY): Payer: Self-pay

## 2021-08-16 ENCOUNTER — Other Ambulatory Visit (HOSPITAL_COMMUNITY): Payer: Self-pay

## 2021-08-19 ENCOUNTER — Encounter: Payer: Medicaid Other | Admitting: Internal Medicine

## 2021-08-22 ENCOUNTER — Ambulatory Visit: Payer: Medicaid Other | Admitting: Internal Medicine

## 2021-08-22 ENCOUNTER — Encounter: Payer: Self-pay | Admitting: Internal Medicine

## 2021-08-22 ENCOUNTER — Other Ambulatory Visit: Payer: Self-pay

## 2021-08-22 VITALS — BP 114/73 | Temp 98.1°F | Ht 62.0 in | Wt 207.7 lb

## 2021-08-22 DIAGNOSIS — Z01818 Encounter for other preprocedural examination: Secondary | ICD-10-CM | POA: Diagnosis not present

## 2021-08-22 DIAGNOSIS — E119 Type 2 diabetes mellitus without complications: Secondary | ICD-10-CM

## 2021-08-22 LAB — POCT GLYCOSYLATED HEMOGLOBIN (HGB A1C): Hemoglobin A1C: 6.2 % — AB (ref 4.0–5.6)

## 2021-08-22 LAB — GLUCOSE, CAPILLARY: Glucose-Capillary: 113 mg/dL — ABNORMAL HIGH (ref 70–99)

## 2021-08-22 NOTE — Progress Notes (Signed)
° °  CC:  Meghan Wilson is a 60 y.o. with medical history as below presenting to [facility] for pre-surgical evaluation.   Please see problem-based list for further details, assessments, and plans.   Review of Systems:  Review of system negative unless stated in the problem list or HPI.    Physical Exam:  Vitals:   08/22/21 0858  BP: 114/73  Temp: 98.1 F (36.7 C)  SpO2: 97%  Weight: 207 lb 11.2 oz (94.2 kg)  Height: 5\' 2"  (1.575 m)   Physical Exam General: Well-developed, NAD Head: Normocephalic without scalp lesions.  Eyes: PERRLA, Conjunctivae pink, sclerae white, without icterus.  Mouth and Throat: Lips normal color, without lesions. Moist mucus membrane. Neck: Neck supple with full range of motion (ROM). No masses or tenderness. Trachea midline.  Lungs: CTAB, no wheeze, rhonchi or rales.  Cardiovascular: Normal heart sounds, no r/m/g, 2+ pulses in all extremities. No LE edema Abdomen: No TTP, normal bowel sounds MSK: No asymmetry or muscle atrophy. Full range of motion (ROM) of all joints. Mild erythema and swelling of right knee (unchanged). Right knee is TTP. Strength 5/5 in all extremities.  Skin: warm, dry good skin turgor, no lesions noted Neuro: Alert and oriented. CN grossly intact Psych: Normal mood and normal affect   Assessment & Plan:   See Encounters Tab for problem based charting.  Patient seen with Dr.  Idamae Schuller, MD   Surgery Clearance Right knee replacement due to pain from arthritis. Has hx of right tibial fraction s/p screw placement. January 17th for removal of hardware and 02/16 total knee arthroplasty  HLD/aortic atherosclerosis: Chol 165 LDL 87 in 05/2021 January 17th for removal of hardware and 02/16 total knee arthroplasty  Diabetes 6.3 in 05/2021 HR: metformin 500 mg XR Revised Cardiac Index for Preoperative Risk: 4% without preop insulin and 6% with insulin

## 2021-08-22 NOTE — Patient Instructions (Signed)
Meghan Wilson, it was a pleasure seeing you today! You endorsed feeling well today. Below are some of the things we talked about this visit. We look forward to seeing you in the follow up appointment!  Today we discussed: You presented for pre-operation evaluation. We will order lab work. From our evaluation, you are at low risk for complications from this surgery. This does not mean you will not have any complications but you are at a low risk for the complications.   Please try to schedule your sleep study as soon as possible.   We wish you the best in your recovery! We are always here if you need [place]!  I have ordered the following labs today:   Lab Orders         Glucose, capillary         CBC with Diff         CMP14 + Anion Gap         Protime-INR         Urinalysis, Reflex Microscopic         POC Hbg A1C       Referrals ordered today:   Referral Orders  No referral(s) requested today     I have ordered the following medication/changed the following medications:   Stop the following medications: There are no discontinued medications.   Start the following medications: No orders of the defined types were placed in this encounter.    Follow-up: 3 month follow up  Please make sure to arrive 15 minutes prior to your next appointment. If you arrive late, you may be asked to reschedule.   We look forward to seeing you next time. Please call our clinic at [phone] if you have any questions or concerns. The best time to call is Monday-Friday from 9am-4pm, but there is someone available 24/7. If after hours or the weekend, call the main hospital number and ask for the Internal Medicine Resident On-Call. If you need medication refills, please notify your pharmacy one week in advance and they will send [place] a request.  Thank you for letting us take part in your care. Wishing you the best!  Thank you, Idamae Schuller, MD

## 2021-08-23 LAB — CBC WITH DIFFERENTIAL/PLATELET
Basophils Absolute: 0.1 10*3/uL (ref 0.0–0.2)
Basos: 1 %
EOS (ABSOLUTE): 0.1 10*3/uL (ref 0.0–0.4)
Eos: 2 %
Hematocrit: 40.9 % (ref 34.0–46.6)
Hemoglobin: 12.8 g/dL (ref 11.1–15.9)
Immature Grans (Abs): 0 10*3/uL (ref 0.0–0.1)
Immature Granulocytes: 0 %
Lymphocytes Absolute: 2.9 10*3/uL (ref 0.7–3.1)
Lymphs: 39 %
MCH: 25.7 pg — ABNORMAL LOW (ref 26.6–33.0)
MCHC: 31.3 g/dL — ABNORMAL LOW (ref 31.5–35.7)
MCV: 82 fL (ref 79–97)
Monocytes Absolute: 0.6 10*3/uL (ref 0.1–0.9)
Monocytes: 8 %
Neutrophils Absolute: 3.7 10*3/uL (ref 1.4–7.0)
Neutrophils: 50 %
Platelets: 318 10*3/uL (ref 150–450)
RBC: 4.98 x10E6/uL (ref 3.77–5.28)
RDW: 13.7 % (ref 11.7–15.4)
WBC: 7.4 10*3/uL (ref 3.4–10.8)

## 2021-08-23 LAB — CMP14 + ANION GAP
ALT: 18 IU/L (ref 0–32)
AST: 14 IU/L (ref 0–40)
Albumin/Globulin Ratio: 2 (ref 1.2–2.2)
Albumin: 4.6 g/dL (ref 3.8–4.8)
Alkaline Phosphatase: 90 IU/L (ref 44–121)
Anion Gap: 15 mmol/L (ref 10.0–18.0)
BUN/Creatinine Ratio: 26 (ref 12–28)
BUN: 15 mg/dL (ref 8–27)
Bilirubin Total: <0.2 mg/dL (ref 0.0–1.2)
CO2: 23 mmol/L (ref 20–29)
Calcium: 9.4 mg/dL (ref 8.7–10.3)
Chloride: 101 mmol/L (ref 96–106)
Creatinine, Ser: 0.58 mg/dL (ref 0.57–1.00)
Globulin, Total: 2.3 g/dL (ref 1.5–4.5)
Glucose: 94 mg/dL (ref 70–99)
Potassium: 4.2 mmol/L (ref 3.5–5.2)
Sodium: 139 mmol/L (ref 134–144)
Total Protein: 6.9 g/dL (ref 6.0–8.5)
eGFR: 103 mL/min/{1.73_m2} (ref >59)

## 2021-08-23 LAB — URINALYSIS, ROUTINE W REFLEX MICROSCOPIC
Bilirubin, UA: NEGATIVE
Glucose, UA: NEGATIVE
Ketones, UA: NEGATIVE
Leukocytes,UA: NEGATIVE
Nitrite, UA: NEGATIVE
Protein,UA: NEGATIVE
RBC, UA: NEGATIVE
Specific Gravity, UA: 1.009 (ref 1.005–1.030)
Urobilinogen, Ur: 0.2 mg/dL (ref 0.2–1.0)
pH, UA: 5.5 (ref 5.0–7.5)

## 2021-08-24 DIAGNOSIS — Z01818 Encounter for other preprocedural examination: Secondary | ICD-10-CM | POA: Insufficient documentation

## 2021-08-24 NOTE — Assessment & Plan Note (Addendum)
Currently patient does not have a contraindication for surgery. The contraindications by the orthopedist  included BMI>40, Hgb<11, Alb<3, A1c>7.5 and no recent smoking. Patient's BMI at 38, Alb 4.6, Hgb 12.8, A1c 6.2 and does not currently smoke satisfies all of these conditions. Patient is not currently on anticoagulation. A PT/INR was ordered but the sample was hemolyzed. Will let orthopedist office know of this and recommend repeating of this lab as patient has an appointment coming up on 09/06/2021. Her Revised Cardiac Index for Preoperative Risk was calculated at 4% indicating patient is at low risk for cardiovascular event during this low risk surgery. Patient has the ability to perform > 4 MET levels of activity and has no active cardiac conditions. °

## 2021-08-24 NOTE — Assessment & Plan Note (Signed)
Patient has diabetes that is well controlled. Her A1c this visit was 6.2. Her current medication includes Metformin 500 mg daily. -Continue current medication

## 2021-08-28 NOTE — Patient Instructions (Addendum)
DUE TO COVID-19 ONLY ONE VISITOR IS ALLOWED TO COME WITH YOU AND STAY IN THE WAITING ROOM ONLY DURING PRE OP AND PROCEDURE.   **NO VISITORS ARE ALLOWED IN THE SHORT STAY AREA OR RECOVERY ROOM!!**  IF YOU WILL BE ADMITTED INTO THE HOSPITAL YOU ARE ALLOWED ONLY TWO SUPPORT PEOPLE DURING VISITATION HOURS ONLY (7 AM -8PM)    Up to two visitors ages 7+ are allowed at one time in a patient's room.  The visitors may rotate out with other people throughout the day.  Additionally, up to two children between the ages of 19 and 65 are allowed and do not count toward the number of allowed visitors.  Children within this age range must be accompanied by an adult visitor.  One adult visitor may remain with the patient overnight and must be in the room by 8 PM.  COVID SWAB TESTING MUST BE COMPLETED ON:  09-13-21 @ 8:15 AM  COME IN THROUGH MAIN ENTRANCE of [facility].  Take a seat in the lobby area to the right as you come in the main entrance.  Call [phone]  and give your name and let them know you are here for COVID testing  You are not required to quarantine, however you are required to wear a well-fitted mask when you are out and around people not in your household.  Hand Hygiene often Do NOT share personal items Notify your provider if you are in close contact with someone who has COVID or you develop fever 100.4 or greater, new onset of sneezing, cough, sore throat, shortness of breath or body aches.       Your procedure is scheduled on:  Tuesday, 09-17-21   Report to [facility] Main  Entrance     Report to admitting at 1:45 PM   Call this number if you have problems the morning of surgery [phone]   Do not eat food :After Midnight.   May have liquids until 12:50 PM day of surgery  CLEAR LIQUID DIET  Foods Allowed                                                                     Foods Excluded  Water, Black Coffee (no milk/no creamer) and tea, regular and decaf                               liquids that you cannot  Plain Jell-O in any flavor  (No red)                         see through such as: Fruit ices (not with fruit pulp)                                 milk, soups, orange juice  Iced Popsicles (No red)                                    All solid food  Apple juices Sports drinks like Gatorade (No red) Lightly seasoned clear broth or consume(fat free) Sugar    Complete one G2 drink the morning of surgery at  12:50 PM the day of surgery.     The day of surgery:  Drink ONE (1) Pre-Surgery G2 the morning of surgery. Drink in one sitting. Do not sip.  This drink was given to you during your hospital  pre-op appointment visit. Nothing else to drink after completing the Pre-Surgery G2.          If you have questions, please contact your surgeons office.     Oral Hygiene is also important to reduce your risk of infection.                                    Remember - BRUSH YOUR TEETH THE MORNING OF SURGERY WITH YOUR REGULAR TOOTHPASTE   Do NOT smoke after Midnight  Take these medicines the morning of surgery with A SIP OF WATER:  Atorvastatin, Lamotrigine, Ondansetron  How to Manage Your Diabetes Before and After Surgery  Why is it important to control my blood sugar before and after surgery? Improving blood sugar levels before and after surgery helps healing and can limit problems. A way of improving blood sugar control is eating a healthy diet by:  Eating less sugar and carbohydrates  Increasing activity/exercise  Talking with your doctor about reaching your blood sugar goals High blood sugars (greater than 180 mg/dL) can raise your risk of infections and slow your recovery, so you will need to focus on controlling your diabetes during the weeks before surgery. Make sure that the doctor who takes care of your diabetes knows about your planned surgery including the date and location.  How do I manage my blood sugar  before surgery? Check your blood sugar at least 4 times a day, starting 2 days before surgery, to make sure that the level is not too high or low. Check your blood sugar the morning of your surgery when you wake up and every 2 hours until you get to the Short Stay unit. If your blood sugar is less than 70 mg/dL, you will need to treat for low blood sugar: Do not take insulin. Treat a low blood sugar (less than 70 mg/dL) with  cup of clear juice (cranberry or apple), 4 glucose tablets, OR glucose gel. Recheck blood sugar in 15 minutes after treatment (to make sure it is greater than 70 mg/dL). If your blood sugar is not greater than 70 mg/dL on recheck, call [phone] for further instructions. Report your blood sugar to the short stay nurse when you get to Short Stay.  If you are admitted to the hospital after surgery: Your blood sugar will be checked by the staff and you will probably be given insulin after surgery (instead of oral diabetes medicines) to make sure you have good blood sugar levels. The goal for blood sugar control after surgery is 80-180 mg/dL.   WHAT DO I DO ABOUT MY DIABETES MEDICATION?  Do not take oral diabetes medicines (pills) the morning of surgery.  THE DAY BEFORE SURGERY:  Take Metformin as prescribed.  THE MORNING OF SURGERY: Do not take Metformin.  Reviewed and Endorsed by [facility] Patient Education Committee, August 2015    Stop all vitamins and herbal supplements a week before surgery  You may not have any metal on your body including hair pins, jewelry, and body piercing             Do not wear make-up, lotions, powders, perfumes or deodorant  Do not wear nail polish including gel and S&S, artificial/acrylic nails, or any other type of covering on natural nails including finger and toenails. If you have artificial nails, gel coating, etc. that needs to be removed by a nail salon please have this removed prior to surgery or surgery may need  to be canceled/ delayed if the surgeon/ anesthesia feels like they are unable to be safely monitored.   Do not shave  48 hours prior to surgery.   Do not bring valuables to the hospital. [place].   Contacts, dentures or bridgework may not be worn into surgery.   Bring small overnight bag day of surgery.  Special Instructions: Bring a copy of your healthcare power of attorney and living will documents the day of surgery if you haven't scanned them in before.  Please read over the following fact sheets you were given: IF YOU HAVE QUESTIONS ABOUT YOUR PRE OP INSTRUCTIONS PLEASE CALL [place] - Preparing for Surgery Before surgery, you can play an important role.  Because skin is not sterile, your skin needs to be as free of germs as possible.  You can reduce the number of germs on your skin by washing with CHG (chlorahexidine gluconate) soap before surgery.  CHG is an antiseptic cleaner which kills germs and bonds with the skin to continue killing germs even after washing. Please DO NOT use if you have an allergy to CHG or antibacterial soaps.  If your skin becomes reddened/irritated stop using the CHG and inform your nurse when you arrive at Short Stay. Do not shave (including legs and underarms) for at least 48 hours prior to the first CHG shower.  You may shave your face/neck.  Please follow these instructions carefully:  1.  Shower with CHG Soap the night before surgery and the  morning of surgery.  2.  If you choose to wash your hair, wash your hair first as usual with your normal  shampoo.  3.  After you shampoo, rinse your hair and body thoroughly to remove the shampoo.                             4.  Use CHG as you would any other liquid soap.  You can apply chg directly to the skin and wash.  Gently with a scrungie or clean washcloth.  5.  Apply the CHG Soap to your body ONLY FROM THE NECK DOWN.   Do   not use on face/ open                            Wound or open sores. Avoid contact with eyes, ears mouth and   genitals (private parts).                       Wash face,  Genitals (private parts) with your normal soap.             6.  Wash thoroughly, paying special attention to the area where your    surgery  will be performed.  7.  Thoroughly rinse your body with warm water from  the neck down.  8.  DO NOT shower/wash with your normal soap after using and rinsing off the CHG Soap.                9.  Pat yourself dry with a clean towel.            10.  Wear clean pajamas.            11.  Place clean sheets on your bed the night of your first shower and do not  sleep with pets. Day of Surgery : Do not apply any lotions/deodorants the morning of surgery.  Please wear clean clothes to the hospital/surgery center.  FAILURE TO FOLLOW THESE INSTRUCTIONS MAY RESULT IN THE CANCELLATION OF YOUR SURGERY  PATIENT SIGNATURE_________________________________  NURSE SIGNATURE__________________________________  ________________________________________________________________________   Meghan Wilson  An incentive spirometer is a tool that can help keep your lungs clear and active. This tool measures how well you are filling your lungs with each breath. Taking long deep breaths may help reverse or decrease the chance of developing breathing (pulmonary) problems (especially infection) following: A long period of time when you are unable to move or be active. BEFORE THE PROCEDURE  If the spirometer includes an indicator to show your best effort, your nurse or respiratory therapist will set it to a desired goal. If possible, sit up straight or lean slightly forward. Try not to slouch. Hold the incentive spirometer in an upright position. INSTRUCTIONS FOR USE  Sit on the edge of your bed if possible, or sit up as far as you can in bed or on a chair. Hold the incentive spirometer in an upright position. Breathe out  normally. Place the mouthpiece in your mouth and seal your lips tightly around it. Breathe in slowly and as deeply as possible, raising the piston or the ball toward the top of the column. Hold your breath for 3-5 seconds or for as long as possible. Allow the piston or ball to fall to the bottom of the column. Remove the mouthpiece from your mouth and breathe out normally. Rest for a few seconds and repeat Steps 1 through 7 at least 10 times every 1-2 hours when you are awake. Take your time and take a few normal breaths between deep breaths. The spirometer may include an indicator to show your best effort. Use the indicator as a goal to work toward during each repetition. After each set of 10 deep breaths, practice coughing to be sure your lungs are clear. If you have an incision (the cut made at the time of surgery), support your incision when coughing by placing a pillow or rolled up towels firmly against it. Once you are able to get out of bed, walk around indoors and cough well. You may stop using the incentive spirometer when instructed by your caregiver.  RISKS AND COMPLICATIONS Take your time so you do not get dizzy or light-headed. If you are in pain, you may need to take or ask for pain medication before doing incentive spirometry. It is harder to take a deep breath if you are having pain. AFTER USE Rest and breathe slowly and easily. It can be helpful to keep track of a log of your progress. Your caregiver can provide you with a simple table to help with this. If you are using the spirometer at home, follow these instructions: [place] IF:  You are having difficultly using the spirometer. You have trouble using the spirometer as often as  instructed. Your pain medication is not giving enough relief while using the spirometer. You develop fever of 100.5 F (38.1 C) or higher. SEEK IMMEDIATE MEDICAL CARE IF:  You cough up bloody sputum that had not been present before. You  develop fever of 102 F (38.9 C) or greater. You develop worsening pain at or near the incision site. MAKE SURE YOU:  Understand these instructions. Will watch your condition. Will get help right away if you are not doing well or get worse. Document Released: 12/29/2006 Document Revised: 11/10/2011 Document Reviewed: 03/01/2007 Exit[employer] Patient Information 2014 [employer], [place].   ________________________________________________________________________  WHAT IS A BLOOD TRANSFUSION? Blood Transfusion Information  A transfusion is the replacement of blood or some of its parts. Blood is made up of multiple cells which provide different functions. Red blood cells carry oxygen and are used for blood loss replacement. White blood cells fight against infection. Platelets control bleeding. Plasma helps clot blood. Other blood products are available for specialized needs, such as hemophilia or other clotting disorders. BEFORE THE TRANSFUSION  Who gives blood for transfusions?  Healthy volunteers who are fully evaluated to make sure their blood is safe. This is blood bank blood. Transfusion therapy is the safest it has ever been in the practice of medicine. Before blood is taken from a donor, a complete history is taken to make sure that person has no history of diseases nor engages in risky social behavior (examples are intravenous drug use or sexual activity with multiple partners). The donor's travel history is screened to minimize risk of transmitting infections, such as malaria. The donated blood is tested for signs of infectious diseases, such as HIV and hepatitis. The blood is then tested to be sure it is compatible with you in order to minimize the chance of a transfusion reaction. If you or a relative donates blood, this is often done in anticipation of surgery and is not appropriate for emergency situations. It takes many days to process the donated blood. RISKS AND COMPLICATIONS Although  transfusion therapy is very safe and saves many lives, the main dangers of transfusion include:  Getting an infectious disease. Developing a transfusion reaction. This is an allergic reaction to something in the blood you were given. Every precaution is taken to prevent this. The decision to have a blood transfusion has been considered carefully by your caregiver before blood is given. Blood is not given unless the benefits outweigh the risks. AFTER THE TRANSFUSION Right after receiving a blood transfusion, you will usually feel much better and more energetic. This is especially true if your red blood cells have gotten low (anemic). The transfusion raises the level of the red blood cells which carry oxygen, and this usually causes an energy increase. The nurse administering the transfusion will monitor you carefully for complications. HOME CARE INSTRUCTIONS  No special instructions are needed after a transfusion. You may find your energy is better. Speak with your caregiver about any limitations on activity for underlying diseases you may have. SEEK MEDICAL CARE IF:  Your condition is not improving after your transfusion. You develop redness or irritation at the intravenous (IV) site. SEEK IMMEDIATE MEDICAL CARE IF:  Any of the following symptoms occur over the next 12 hours: Shaking chills. You have a temperature by mouth above 102 F (38.9 C), not controlled by medicine. Chest, back, or muscle pain. People around you feel you are not acting correctly or are confused. Shortness of breath or difficulty breathing. Dizziness and fainting. You get  a rash or develop hives. You have a decrease in urine output. Your urine turns a dark color or changes to pink, red, or brown. Any of the following symptoms occur over the next 10 days: You have a temperature by mouth above 102 F (38.9 C), not controlled by medicine. Shortness of breath. Weakness after normal activity. The white part of the eye  turns yellow (jaundice). You have a decrease in the amount of urine or are urinating less often. Your urine turns a dark color or changes to pink, red, or brown. Document Released: 08/15/2000 Document Revised: 11/10/2011 Document Reviewed: 04/03/2008 [facility] Patient Information 2014 [place], [place].  _______________________________________________________________________

## 2021-08-28 NOTE — Progress Notes (Addendum)
COVID swab appointment:  09-13-21 @  COVID Vaccine Completed:  Yes x3 Date COVID Vaccine completed: 11-10-19 12-01-19 Has received booster:  Yes x1 08-31-20 COVID vaccine manufacturer: [place]      Date of COVID positive in last 90 days:  No  PCP - Idamae Schuller, MD Cardiologist - N/A  Chest x-ray - N/A EKG - 05-02-21 Epic Stress Test - greater than 2 years [facility] ECHO - N/A Cardiac Cath - N/A Pacemaker/ICD device last checked: Spinal Cord Stimulator:  Sleep Study - 2022, +sleep apnea CPAP - No  Fasting Blood Sugar - 98 to 119 Checks Blood Sugar  - every other day  Blood Thinner Instructions:  N/A Aspirin Instructions: Last Dose:  Activity level:   Can go up a flight of stairs and perform activities of daily living without stopping and without symptoms of chest pain or shortness of breath.    Anesthesia review:  Patient seen in ED 12-03-15 for chest pain and stress test done (notes [facility]).  Patient stated all testing normal and felt to be due to stress.  Patient states that she has pretty severe anxiety.  DM, OSA (no CPAP)  Patient denies shortness of breath, fever, cough and chest pain at PAT appointment   Patient verbalized understanding of instructions that were given to them at the PAT appointment. Patient was also instructed that they will need to review over the PAT instructions again at home before surgery.

## 2021-08-30 ENCOUNTER — Other Ambulatory Visit (HOSPITAL_COMMUNITY): Payer: Self-pay

## 2021-09-03 NOTE — Progress Notes (Addendum)
Internal Medicine Clinic Attending  I saw and evaluated the patient.  I personally confirmed the key portions of the history and exam documented by Dr. Humphrey Rolls and I reviewed pertinent patient test results.  The assessment, diagnosis, and plan were formulated together and I agree with the documentation in the residents note. RCRI 3.9%, low cardiac risk for surgery. No chest pain, shortness of breath or other symptoms when performing >/= 4 METs. Of note, patient does likely have OSA but sleep study is pending. Patient saw sleep medicine on 07/31/21 who noted that patients with significant OSA should receive, if possible, perioperative PAP therapy. We would recommend extubation to positive pressure. Severity in this patient unknown given pending sleep study. Note and message sent to Dr. Alvan Dame for review.

## 2021-09-04 ENCOUNTER — Other Ambulatory Visit: Payer: Self-pay

## 2021-09-04 ENCOUNTER — Encounter (HOSPITAL_COMMUNITY): Payer: Self-pay

## 2021-09-04 ENCOUNTER — Encounter (HOSPITAL_COMMUNITY)
Admission: RE | Admit: 2021-09-04 | Discharge: 2021-09-04 | Disposition: A | Discharge status: Home / Self Care | Payer: Medicaid Other | Source: Ambulatory Visit | Attending: Orthopedic Surgery | Admitting: Orthopedic Surgery

## 2021-09-04 VITALS — BP 130/76 | HR 93 | Temp 98.4°F | Resp 18 | Ht 63.0 in | Wt 203.6 lb

## 2021-09-04 DIAGNOSIS — Z01818 Encounter for other preprocedural examination: Secondary | ICD-10-CM

## 2021-09-04 DIAGNOSIS — E119 Type 2 diabetes mellitus without complications: Secondary | ICD-10-CM | POA: Insufficient documentation

## 2021-09-04 DIAGNOSIS — Z967 Presence of other bone and tendon implants: Secondary | ICD-10-CM | POA: Diagnosis not present

## 2021-09-04 DIAGNOSIS — G4730 Sleep apnea, unspecified: Secondary | ICD-10-CM | POA: Diagnosis not present

## 2021-09-04 DIAGNOSIS — M1711 Unilateral primary osteoarthritis, right knee: Secondary | ICD-10-CM | POA: Insufficient documentation

## 2021-09-04 DIAGNOSIS — K219 Gastro-esophageal reflux disease without esophagitis: Secondary | ICD-10-CM | POA: Diagnosis not present

## 2021-09-04 DIAGNOSIS — Z01812 Encounter for preprocedural laboratory examination: Secondary | ICD-10-CM | POA: Diagnosis present

## 2021-09-04 DIAGNOSIS — Z87891 Personal history of nicotine dependence: Secondary | ICD-10-CM | POA: Diagnosis not present

## 2021-09-04 HISTORY — DX: Personal history of urinary calculi: Z87.442

## 2021-09-04 HISTORY — DX: Prediabetes: R73.03

## 2021-09-04 LAB — TYPE AND SCREEN
ABO/RH(D): A POS
Antibody Screen: NEGATIVE

## 2021-09-04 LAB — GLUCOSE, CAPILLARY: Glucose-Capillary: 126 mg/dL — ABNORMAL HIGH (ref 70–99)

## 2021-09-06 ENCOUNTER — Other Ambulatory Visit (HOSPITAL_COMMUNITY): Payer: Self-pay

## 2021-09-06 ENCOUNTER — Telehealth: Payer: Self-pay | Admitting: Gastroenterology

## 2021-09-06 DIAGNOSIS — K9049 Malabsorption due to intolerance, not elsewhere classified: Secondary | ICD-10-CM

## 2021-09-06 MED FILL — Metformin HCl Tab ER 24HR 500 MG: ORAL | 90 days supply | Qty: 90 | Fill #2 | Status: AC

## 2021-09-06 NOTE — Telephone Encounter (Signed)
Attempted to reach patient twice. Her vm is not set up. Unable to leave a vm at this time. Will attempt to reach pt again at a later time.

## 2021-09-06 NOTE — Telephone Encounter (Signed)
Inbound call from patient states stomach sore and severe bloating when eat meat. States maybe she need to possible speak with a nutritionist to help with diet.

## 2021-09-06 NOTE — Progress Notes (Signed)
Anesthesia Chart Review   Case: [id number] Date/Time: 09/17/21 1543   Procedure: Removal of hardware from right tibia (Right)   Anesthesia type: Spinal   Pre-op diagnosis: Status post right tibial plateau open reduction internal fixation and right knee osteoarthritis   Location: [facility] ROOM 09 / [facility]   Surgeons: Paralee Cancel, MD       DISCUSSION:61 y.o. former smoker with h/o GERD, sleep apnea, DM II (A1C 6.3), status post right tibial plateau ORIF scheduled for above procedure 09/17/2021 with Dr. Paralee Cancel.   Pt seen by PCP 08/22/2022 for preoperative evaluation.  Per OV note, "RCRI 3.9%. No chest pain, shortness of breath or other symptoms when performing >/= 4 METs. Of note, patient does likely have OSA but sleep study is pending. Patient saw sleep medicine on 07/31/21 who noted that patients with significant OSA should receive, if possible, perioperative PAP therapy. Severity in this patient unknown given pending sleep study."  Anticipate pt can proceed with planned procedure barring acute status change.   VS: BP 130/76    Pulse 93    Temp 36.9 C (Oral)    Resp 18    Ht 5\' 3"  (1.6 m)    Wt 92.4 kg    SpO2 100%    BMI 36.07 kg/m   PROVIDERS: Idamae Schuller, MD is PCP    LABS: Labs reviewed: Acceptable for surgery. (all labs ordered are listed, but only abnormal results are displayed)  Labs Reviewed  GLUCOSE, CAPILLARY - Abnormal; Notable for the following components:      Result Value   Glucose-Capillary 126 (*)    All other components within normal limits  TYPE AND SCREEN     IMAGES:   EKG: 05/02/2021 Rate 88 bpm  NSR  CV:  Past Medical History:  Diagnosis Date   Anxiety    Arthritis    Bipolar depression ([place]) 09/2015   Depression 06/27/2021   Dizziness    GERD (gastroesophageal reflux disease)    History of colon polyps    History of kidney stones    History of patellar fracture 2016   History of tibial fracture 2016   Hyperlipidemia    Night terrors     Night terrors, adult 09/2015   Pain of right knee after injury 03/10/2018   Post concussion syndrome 12/20/2019   Pre-diabetes    Seasonal allergies 11/09/2020   Sleep apnea    doesnt wear c-pap    Past Surgical History:  Procedure Laterality Date   ABDOMINAL HYSTERECTOMY     CHOLECYSTECTOMY     CLOSED REDUCTION TIBIAL FRACTURE Right 2003   COLONOSCOPY  2016   [place]  2022   WISDOM TOOTH EXTRACTION      MEDICATIONS:  Accu-Chek FastClix Lancets MISC   acetaminophen (TYLENOL) 325 MG tablet   atorvastatin (LIPITOR) 80 MG tablet   Cholecalciferol (VITAMIN D3) 125 MCG (5000 UT) TABS   Cyanocobalamin (B-12 PO)   docusate sodium (COLACE) 100 MG capsule   Flaxseed, Linseed, (FLAX SEEDS PO)   fluticasone (FLONASE) 50 MCG/ACT nasal spray   glucose blood test strip   Glycerin-Hypromellose-PEG 400 (DRY EYE RELIEF DROPS) 0.2-0.2-1 % SOLN   Hyoscyamine Sulfate SL (LEVSIN/SL) 0.125 MG SUBL   lamoTRIgine (LAMICTAL) 100 MG tablet   linaclotide (LINZESS) 145 MCG CAPS capsule   metFORMIN (GLUCOPHAGE-XR) 500 MG 24 hr tablet   ondansetron (ZOFRAN-ODT) 4 MG disintegrating tablet   pantoprazole (PROTONIX) 40 MG tablet   Peppermint Oil (IBGARD)  90 MG CPCR   prazosin (MINIPRESS) 1 MG capsule   triamcinolone cream (KENALOG) 0.1 %   triamcinolone ointment (KENALOG) 0.5 %   No current facility-administered medications for this encounter.   Konrad Felix Ward, PA-C [facility] [phone]

## 2021-09-09 NOTE — Telephone Encounter (Signed)
Called and spoke with patient. She states that she was wanting to know her results of the Alpha Gal test. I have reviewed Dr. Doyne Keel note to patient and recommendations.  Pt states that she knows pork agitates her and causes her to have bloating and diarrhea. Pt would like a referral to a nutritionist to help with her diet. I advised patient that she should begin keeping a food diary to help determine what triggers her and the symptoms that she has with certain foods. I told pt that I will check with Dr. Havery Moros about the referral and get back with her. She is aware that he is out of the office today and will return tomorrow. Pt verbalized understanding and had no concerns at the end of the call.

## 2021-09-10 NOTE — Telephone Encounter (Signed)
Called and spoke with patient in regards to recommendations. Pt states that the Linzess has been helping. She states that she may take one pill every other day, sometimes she will only need it twice a week. Pt is aware that we placed the referral to nutrition today. She knows to expect a call from the nutrition office to set up her appt. Pt verbalized understanding and had no concerns at the end of the call.  Ambulatory referral to nutrition in epic.

## 2021-09-10 NOTE — Telephone Encounter (Signed)
Thanks Tok, please let her know her results we on Mychart if she can login in there to see all of her labs. I sent her a message with that result at the time.  I suspect she has a sensitivity / intolerance to pork and would avoid moving forward if eating it reliably makes her feel bad. I had also given her a trial of Linzess to see if that would help her constipation. If she would like to see a nutritionist to discuss dietary options with her that is fine, we can do that, I generally recommend otherwise avoiding foods that are likely to trigger her symptoms. She can follow up with me as needed in the office.

## 2021-09-11 ENCOUNTER — Encounter: Payer: Self-pay | Admitting: Registered"

## 2021-09-11 ENCOUNTER — Encounter: Payer: Medicaid Other | Attending: Gastroenterology | Admitting: Registered"

## 2021-09-11 ENCOUNTER — Other Ambulatory Visit (HOSPITAL_COMMUNITY): Payer: Self-pay

## 2021-09-11 ENCOUNTER — Other Ambulatory Visit: Payer: Self-pay

## 2021-09-11 ENCOUNTER — Other Ambulatory Visit: Payer: Self-pay | Admitting: Internal Medicine

## 2021-09-11 DIAGNOSIS — E785 Hyperlipidemia, unspecified: Secondary | ICD-10-CM

## 2021-09-11 DIAGNOSIS — Z713 Dietary counseling and surveillance: Secondary | ICD-10-CM | POA: Insufficient documentation

## 2021-09-11 NOTE — Patient Instructions (Addendum)
-   Aim to have 1/2 plate of non-starchy vegetables with lunch and dinner.   - Balance breakfast with carbohydrates and protein such as oatmeal + boiled egg.   - Continue to drink water with meals and throughout the day. Great job with this!

## 2021-09-11 NOTE — Progress Notes (Signed)
Medical Nutrition Therapy  Appointment Start time:  10:31  Appointment End time:  11:46  Primary concerns today: knowing what she can start to eat to feel about herself and confidence to lose weight  Referral diagnosis: food intolerance (to pork) Preferred learning style: no preference indicated Learning readiness: ready, change in progress   NUTRITION ASSESSMENT   Pt arrives stating she wants to lose weight because she is not healthy at this weight. 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 States she was 207 lbs and lost 35 lbs. Reports weighing 172 lbs and then pandemic came. Reports she was depressed due to having to work during pandemic, increasing rent for her home, and financial struggles. States her depression increased. Reports having a therapist and currently working with him. States she then had stomach challenges and blood in urine. States she feels like she has failed herself due to weight gain and wants to lose weight down to 145-150 lbs.   States she has been misplacing things more often lately. States weight gain is depressing her and certain things are upsetting her stomach.   States she is trying to eat healthy but food is so expensive. Reports she has food at home but concerned about the health of it. States she was told not to eat oatmeal, canned foods, corn, and potatoes. States she didn't know what to eat so she started eating everything.   States she plans to switch PCPs. States she has sleep apnea and doesn't sleep well at night. States she will snack on sugar-free candy throughout the night; 3-4 pieces. States she has right knee pain and limited physical activity. States she will have knee replacement in 10/2021. Has membership at [employer]. States she works 6 hr shifts, 3-4 days/week. Reports receiving EBT benefits $372/month.   Clinical Medical Hx: sleep apnea,  Medications: See list Labs: elevated A1c (6.3) Notable Signs/Symptoms:  not moving like she should, feels heavy and bloated  Lifestyle & Dietary Hx  Estimated daily fluid intake: 96 oz Supplements: See list Sleep: challenges Stress / self-care: none reported Current average weekly physical activity: none reported  24-Hr Dietary Recall First Meal: 2 slices of garlic butter toast Snack:  Second Meal: 8 chicken nuggets  Snack:  Third Meal: [employer] - 3 pieces of chicken + fries + biscuit Snack:  Beverages: water (6*16 oz; 96 oz)   NUTRITION DIAGNOSIS  NB-1.1 Food and nutrition-related knowledge deficit As related to prediabetes.  As evidenced by pt verbalizes incomplete knowledge.   NUTRITION INTERVENTION  Nutrition education (E-1) on the following topics: Pt was educated and counseled on importance of not skipping meals, how carbohydrates work in the body, prediabetes/diabetes risk factors, A1c ranges for prediabetes/diabetes, role of fiber in eating regimen, and importance of physical activity. Discussed meal/snack planning and how to balance meals/snacks using MyPlate for prediabetes. Pt agreed with goals listed.  Handouts Provided Include  Type 2 Diabetes Support Group MyPlate for Prediabetes  Learning Style & Readiness for Change Teaching method utilized: Visual & Auditory  Demonstrated degree of understanding via: Teach Back  Barriers to learning/adherence to lifestyle change: financial insecurity  Goals Established by Pt Aim to have 1/2 plate of non-starchy vegetables with lunch and dinner.  Balance breakfast with carbohydrates and protein such as oatmeal + boiled egg.  Continue to drink water with meals and throughout the day. Great job with this!   MONITORING & EVALUATION Dietary intake, weekly physical activity.  Next Steps  Patient is to follow-up in 4 weeks.

## 2021-09-13 ENCOUNTER — Encounter (HOSPITAL_COMMUNITY)
Admission: RE | Admit: 2021-09-13 | Discharge: 2021-09-13 | Disposition: A | Discharge status: Home / Self Care | Payer: Medicaid Other | Source: Ambulatory Visit | Attending: Orthopedic Surgery | Admitting: Orthopedic Surgery

## 2021-09-13 ENCOUNTER — Other Ambulatory Visit: Payer: Self-pay

## 2021-09-13 ENCOUNTER — Other Ambulatory Visit (HOSPITAL_COMMUNITY): Payer: Self-pay

## 2021-09-13 DIAGNOSIS — Z01812 Encounter for preprocedural laboratory examination: Secondary | ICD-10-CM | POA: Diagnosis not present

## 2021-09-13 DIAGNOSIS — Z20822 Contact with and (suspected) exposure to covid-19: Secondary | ICD-10-CM | POA: Insufficient documentation

## 2021-09-13 DIAGNOSIS — Z01818 Encounter for other preprocedural examination: Secondary | ICD-10-CM

## 2021-09-13 LAB — SARS CORONAVIRUS 2 (TAT 6-24 HRS): SARS Coronavirus 2: NEGATIVE

## 2021-09-13 MED ORDER — ATORVASTATIN CALCIUM 80 MG PO TABS
80.0000 mg | ORAL_TABLET | Freq: Every day | ORAL | 11 refills | Status: DC
  Filled 2021-09-13: qty 30, 30d supply, fill #0
  Filled 2021-10-17 – 2021-10-23 (×2): qty 30, 30d supply, fill #1
  Filled 2021-11-15: qty 30, 30d supply, fill #2
  Filled 2021-12-16: qty 30, 30d supply, fill #3
  Filled 2022-01-20: qty 30, 30d supply, fill #4
  Filled 2022-02-17: qty 30, 30d supply, fill #5
  Filled 2022-03-21: qty 30, 30d supply, fill #6
  Filled 2022-04-23: qty 30, 30d supply, fill #7
  Filled 2022-05-27: qty 30, 30d supply, fill #8
  Filled 2022-06-30: qty 30, 30d supply, fill #9
  Filled 2022-07-28: qty 30, 30d supply, fill #10
  Filled 2022-09-03: qty 30, 30d supply, fill #11

## 2021-09-13 NOTE — Telephone Encounter (Signed)
Patient calling from [facility] regarding refill request for Atorvastatin. First routed on 1/11. She states she lives a fair distance from pharmacy and is requesting refill to be sent now.

## 2021-09-16 DIAGNOSIS — M25511 Pain in right shoulder: Secondary | ICD-10-CM | POA: Insufficient documentation

## 2021-09-16 HISTORY — DX: Pain in right shoulder: M25.511

## 2021-09-16 NOTE — H&P (Signed)
HARDWARE REMOVAL ADMISSION H&P  Patient is being admitted for right knee hardware removal  Subjective:  Chief Complaint:right knee pain and osteoarthritis with retained hardware  HPI: Meghan Wilson, 61 y.o. female, has a history of pain and functional disability in the right knee due to arthritis and has failed non-surgical conservative treatments for greater than 12 weeks to includeNSAID's and/or analgesics and activity modification.  Onset of symptoms was gradual, starting 3 years ago with gradually worsening course since that time. She has a history of right tibial plateau fracture with ORIF 2003 in [place], [place]. She has had pain since surgery, which is worsening lately. She is planning to have right total knee arthroplasty with Dr. Alvan Dame. Dr. Alvan Dame discussed two stage surgery with hardware removal followed by knee replacement at a separate time.The patient denies an active infection.    Patient Active Problem List   Diagnosis Date Noted   Preoperative evaluation to rule out surgical contraindication 08/24/2021   Aortic atherosclerosis ([place]) 07/08/2021   Depression 06/27/2021   Obesity (BMI 35.0-39.9 without comorbidity) 06/27/2021   Generalized abdominal discomfort 05/06/2021   Fatigue 05/06/2021   UTI (urinary tract infection) 05/02/2021   Chest pain, musculoskeletal 05/02/2021   Vaginal atrophy 11/09/2020   Type II diabetes mellitus ([place]) 02/16/2019   Healthcare maintenance 01/02/2018   Hyperlipidemia 01/01/2018   Peptic gastritis 01/01/2018   Bipolar disorder ([place]) 08/03/2015   Past Medical History:  Diagnosis Date   Anxiety    Arthritis    Bipolar depression ([place]) 09/2015   Depression 06/27/2021   Dizziness    GERD (gastroesophageal reflux disease)    History of colon polyps    History of kidney stones    History of patellar fracture 2016   History of tibial fracture 2016   Hyperlipidemia    Night terrors    Night terrors, adult 09/2015   Pain of right knee  after injury 03/10/2018   Post concussion syndrome 12/20/2019   Pre-diabetes    Seasonal allergies 11/09/2020   Sleep apnea    doesnt wear c-pap    Past Surgical History:  Procedure Laterality Date   ABDOMINAL HYSTERECTOMY     CHOLECYSTECTOMY     CLOSED REDUCTION TIBIAL FRACTURE Right 2003   COLONOSCOPY  2016   [place]  2022   WISDOM TOOTH EXTRACTION      No current facility-administered medications for this encounter.   Current Outpatient Medications  Medication Sig Dispense Refill Last Dose   Cholecalciferol (VITAMIN D3) 125 MCG (5000 UT) TABS Take 5,000 Units by mouth daily.      Cyanocobalamin (B-12 PO) Take 1,000 mcg by mouth 2 (two) times daily.      docusate sodium (COLACE) 100 MG capsule Take 100 mg by mouth See admin instructions. Take twice a day every other day      Flaxseed, Linseed, (FLAX SEEDS PO) Take 500 mg by mouth 2 (two) times daily.      fluticasone (FLONASE) 50 MCG/ACT nasal spray Place 1 spray into both nostrils daily as needed for allergies or rhinitis (Patient taking differently: Place 1 spray into both nostrils daily as needed for allergies or rhinitis.) 16 g 2    Glycerin-Hypromellose-PEG 400 (DRY EYE RELIEF DROPS) 0.2-0.2-1 % SOLN Place 1 drop into both ears 3 (three) times daily.      lamoTRIgine (LAMICTAL) 100 MG tablet Take 1 tablet by mouth once a day 30 tablet 3    linaclotide (LINZESS) 145 MCG CAPS capsule  Take 1 capsule (145 mcg total) by mouth daily before breakfast. 30 capsule 3    metFORMIN (GLUCOPHAGE-XR) 500 MG 24 hr tablet TAKE 1 TABLET (500 MG TOTAL) BY MOUTH DAILY WITH BREAKFAST. 90 tablet 3    ondansetron (ZOFRAN-ODT) 4 MG disintegrating tablet DISSOLVE 1 TABLET UNDER THE TONGUE EVERY 8 HOURS AS NEEDED FOR NAUSEA AND VOMITING. 30 tablet 1    Peppermint Oil (IBGARD) 90 MG CPCR Take as directed (Patient taking differently: Take 90 mg by mouth every 3 (three) days. Take as directed)      prazosin (MINIPRESS) 1 MG capsule  Take 2 capsules by mouth at bedtime (Patient taking differently: Take 2 mg by mouth daily as needed (If can't sleep or having nightmare).) 60 capsule 3    triamcinolone cream (KENALOG) 0.1 % Apply 1 application topically daily. Apply underneath breast as needed (Patient taking differently: Apply 1 application topically daily as needed (Apply underneath breast as needed).) 45 g 3    triamcinolone ointment (KENALOG) 0.5 % Apply 1 application topically daily. Apply to hands as needed daily (Patient taking differently: Apply 1 application topically daily as needed (Apply to hands as needed daily).) 30 g 5    Accu-Chek FastClix Lancets MISC USE 1 TO CHECK BLOOD SUGAR UP TO 7 TIMES A WEEK. 102 each 3    acetaminophen (TYLENOL) 325 MG tablet TAKE 1 TO 2 TABLETS EVERY 6 HOURS AS NEEDED. (Patient not taking: Reported on 08/27/2021) 30 tablet 0 Not Taking   atorvastatin (LIPITOR) 80 MG tablet Take 1 tablet (80 mg total) by mouth daily. 30 tablet 11    glucose blood test strip USE TO CHECK BLOOD GLUCOSE UP TO 7 TIMES A WEEK. 100 strip 3    Hyoscyamine Sulfate SL (LEVSIN/SL) 0.125 MG SUBL Place 1 tablet under the tongue every 8 (eight) hours as needed. (Patient not taking: Reported on 08/27/2021) 30 tablet 0 Not Taking   pantoprazole (PROTONIX) 40 MG tablet Take 1 tablet (40 mg total) by mouth daily. Try to taper off and then take only as needed (Patient not taking: Reported on 08/27/2021)   Not Taking   Allergies  Allergen Reactions   Depakote [Divalproex Sodium] Other (See Comments)    Hair fell out   Ibuprofen Nausea And Vomiting   Prednisone Other (See Comments)    Constipation, nervousness All steroids   Latex Rash    Social History   Tobacco Use   Smoking status: Former    Packs/day: 0.25    Years: 4.00    Pack years: 1.00    Types: Cigarettes    Quit date: 12/21/2017    Years since quitting: 3.7   Smokeless tobacco: Never  Substance Use Topics   Alcohol use: Yes    Comment: special  occasions    Family History  Problem Relation Age of Onset   Hyperlipidemia Mother    Hypertension Mother    Colon polyps Mother    Heart disease Mother    Arthritis/Rheumatoid Father    Hyperlipidemia Maternal Aunt    Hypertension Maternal Aunt    Cancer Maternal Aunt    Colon polyps Maternal Grandmother    Stroke Other    Colon cancer Neg Hx    Esophageal cancer Neg Hx    Stomach cancer Neg Hx    Rectal cancer Neg Hx    Sleep apnea Neg Hx      Review of Systems  Constitutional:  Negative for chills and fever.  Respiratory:  Negative for cough  and shortness of breath.   Cardiovascular:  Negative for chest pain.  Gastrointestinal:  Negative for nausea and vomiting.  Musculoskeletal:  Positive for arthralgias.    Objective:  Physical Exam Well nourished and well developed. General: Alert and oriented x3, cooperative and pleasant, no acute distress. Head: normocephalic, atraumatic, neck supple. Eyes: EOMI.  Musculoskeletal: Right knee exam: Examination of her knee today reveals no signs of infection She has a midline incision where both medial and lateral plates were placed through. There is no signs of infection or effusion in her knee She does have a 5 degree flexion contracture  Calves soft and nontender. Motor function intact in LE. Strength 5/5 LE bilaterally. Neuro: Distal pulses 2+. Sensation to light touch intact in LE.  Vital signs in last 24 hours:    Labs:   Estimated body mass index is 36.07 kg/m as calculated from the following:   Height as of 09/04/21: 5\' 3"  (1.6 m).   Weight as of 09/04/21: 92.4 kg.   Imaging Review Plain radiographs demonstrate severe degenerative joint disease of the right knee(s). She has retained hardware from prior tibial plateau fracture in 2003. The overall alignment isneutral. The bone quality appears to be adequate for age and reported activity level.      Assessment/Plan:  End stage arthritis, right knee   The  patient history, physical examination, clinical judgment of the provider and imaging studies are consistent with end stage degenerative joint disease of the right knee(s) and total knee arthroplasty is deemed medically necessary. The treatment options including medical management, injection therapy arthroscopy and arthroplasty were discussed at length. The risks and benefits of total knee arthroplasty were presented and reviewed. The risks due to aseptic loosening, infection, stiffness, patella tracking problems, thromboembolic complications and other imponderables were discussed. The patient acknowledged the explanation, agreed to proceed with the plan and consent was signed. Patient is being admitted for inpatient treatment for surgery, pain control, PT, OT, prophylactic antibiotics, VTE prophylaxis, progressive ambulation and ADL's and discharge planning. The patient is planning to be discharged  home.  Therapy Plans: No therapy Disposition: Home with fiance Planned DVT Prophylaxis: aspirin 81mg  BID DME needed: walker PCP: Dr. Humphrey Rolls, clearance received TXA: IV Allergies: Ibuprofen - stomach irritation, latex - rash, prednisone - N/V, depakote - hair loss, rash Anesthesia Concerns: none BMI: 36.5 Last HgbA1c: 6.2%  Other: - Oxycodone, tylenol - No NSAIDs - difficulty with stomach pain/reflux - Loves hiking, fishing, swimming  Costella Hatcher, PA-C Orthopedic Surgery [facility] [place] [phone]

## 2021-09-17 ENCOUNTER — Observation Stay (HOSPITAL_COMMUNITY)
Admission: RE | Admit: 2021-09-17 | Discharge: 2021-09-18 | Disposition: A | Discharge status: Home / Self Care | Payer: Medicaid Other | Attending: Orthopedic Surgery | Admitting: Orthopedic Surgery

## 2021-09-17 ENCOUNTER — Other Ambulatory Visit: Payer: Self-pay

## 2021-09-17 ENCOUNTER — Encounter (HOSPITAL_COMMUNITY)
Admission: RE | Disposition: A | Discharge status: Home / Self Care | Payer: Self-pay | Source: Home / Self Care | Attending: Orthopedic Surgery

## 2021-09-17 ENCOUNTER — Ambulatory Visit (HOSPITAL_COMMUNITY): Payer: Medicaid Other | Admitting: Physician Assistant

## 2021-09-17 ENCOUNTER — Encounter (HOSPITAL_COMMUNITY): Payer: Self-pay | Admitting: Orthopedic Surgery

## 2021-09-17 ENCOUNTER — Ambulatory Visit (HOSPITAL_COMMUNITY): Payer: Medicaid Other | Admitting: Certified Registered Nurse Anesthetist

## 2021-09-17 DIAGNOSIS — M1711 Unilateral primary osteoarthritis, right knee: Secondary | ICD-10-CM

## 2021-09-17 DIAGNOSIS — Z7984 Long term (current) use of oral hypoglycemic drugs: Secondary | ICD-10-CM | POA: Insufficient documentation

## 2021-09-17 DIAGNOSIS — Z87891 Personal history of nicotine dependence: Secondary | ICD-10-CM | POA: Insufficient documentation

## 2021-09-17 DIAGNOSIS — E119 Type 2 diabetes mellitus without complications: Secondary | ICD-10-CM | POA: Insufficient documentation

## 2021-09-17 DIAGNOSIS — M1731 Unilateral post-traumatic osteoarthritis, right knee: Secondary | ICD-10-CM | POA: Diagnosis not present

## 2021-09-17 DIAGNOSIS — Z9889 Other specified postprocedural states: Secondary | ICD-10-CM

## 2021-09-17 DIAGNOSIS — Z91040 Latex allergy status: Secondary | ICD-10-CM | POA: Insufficient documentation

## 2021-09-17 DIAGNOSIS — Z472 Encounter for removal of internal fixation device: Secondary | ICD-10-CM | POA: Diagnosis not present

## 2021-09-17 HISTORY — PX: HARDWARE REMOVAL: SHX979

## 2021-09-17 HISTORY — DX: Other specified postprocedural states: Z98.890

## 2021-09-17 LAB — GLUCOSE, CAPILLARY
Glucose-Capillary: 117 mg/dL — ABNORMAL HIGH (ref 70–99)
Glucose-Capillary: 144 mg/dL — ABNORMAL HIGH (ref 70–99)
Glucose-Capillary: 197 mg/dL — ABNORMAL HIGH (ref 70–99)

## 2021-09-17 LAB — ABO/RH: ABO/RH(D): A POS

## 2021-09-17 SURGERY — REMOVAL, HARDWARE
Anesthesia: General | Laterality: Right

## 2021-09-17 MED ORDER — AMISULPRIDE (ANTIEMETIC) 5 MG/2ML IV SOLN: 10.0000 mg | Freq: Once | INTRAVENOUS | Status: DC | PRN

## 2021-09-17 MED ORDER — METOCLOPRAMIDE HCL 5 MG/ML IJ SOLN: 5.0000 mg | Freq: Three times a day (TID) | INTRAMUSCULAR | Status: DC | PRN

## 2021-09-17 MED ORDER — ACETAMINOPHEN 325 MG PO TABS: 325.0000 mg | ORAL_TABLET | Freq: Four times a day (QID) | ORAL | Status: DC | PRN

## 2021-09-17 MED ORDER — LINACLOTIDE 145 MCG PO CAPS
145.0000 ug | ORAL_CAPSULE | Freq: Every day | ORAL | Status: DC
  Administered 2021-09-18: 145 ug via ORAL
  Filled 2021-09-17: qty 1

## 2021-09-17 MED ORDER — FENTANYL CITRATE (PF) 100 MCG/2ML IJ SOLN
INTRAMUSCULAR | Status: AC
  Filled 2021-09-17: qty 2

## 2021-09-17 MED ORDER — METHOCARBAMOL 1000 MG/10ML IJ SOLN
500.0000 mg | Freq: Four times a day (QID) | INTRAVENOUS | Status: DC | PRN
  Filled 2021-09-17: qty 5

## 2021-09-17 MED ORDER — 0.9 % SODIUM CHLORIDE (POUR BTL) OPTIME
TOPICAL | Status: DC | PRN
  Administered 2021-09-17: 1000 mL

## 2021-09-17 MED ORDER — KETOROLAC TROMETHAMINE 15 MG/ML IJ SOLN
7.5000 mg | Freq: Four times a day (QID) | INTRAMUSCULAR | Status: DC
  Administered 2021-09-17 – 2021-09-18 (×3): 7.5 mg via INTRAVENOUS
  Filled 2021-09-17 (×3): qty 1

## 2021-09-17 MED ORDER — ONDANSETRON HCL 4 MG/2ML IJ SOLN
4.0000 mg | Freq: Four times a day (QID) | INTRAMUSCULAR | Status: DC | PRN
  Administered 2021-09-17: 4 mg via INTRAVENOUS
  Filled 2021-09-17: qty 2

## 2021-09-17 MED ORDER — FENTANYL CITRATE PF 50 MCG/ML IJ SOSY: 50.0000 ug | PREFILLED_SYRINGE | INTRAMUSCULAR | Status: DC

## 2021-09-17 MED ORDER — METFORMIN HCL ER 500 MG PO TB24
500.0000 mg | ORAL_TABLET | Freq: Every day | ORAL | Status: DC
  Administered 2021-09-18: 500 mg via ORAL
  Filled 2021-09-17: qty 1

## 2021-09-17 MED ORDER — ONDANSETRON HCL 4 MG/2ML IJ SOLN
INTRAMUSCULAR | Status: DC | PRN
  Administered 2021-09-17: 4 mg via INTRAVENOUS

## 2021-09-17 MED ORDER — DIPHENHYDRAMINE HCL 12.5 MG/5ML PO ELIX: 12.5000 mg | ORAL_SOLUTION | ORAL | Status: DC | PRN

## 2021-09-17 MED ORDER — POVIDONE-IODINE 10 % EX SWAB
2.0000 "application " | Freq: Once | CUTANEOUS | Status: AC
  Administered 2021-09-17: 2 via TOPICAL

## 2021-09-17 MED ORDER — POLYVINYL ALCOHOL 1.4 % OP SOLN
1.0000 [drp] | Freq: Three times a day (TID) | OPHTHALMIC | Status: DC
  Administered 2021-09-18: 1 [drp] via OPHTHALMIC
  Filled 2021-09-17: qty 15

## 2021-09-17 MED ORDER — METHOCARBAMOL 500 MG PO TABS: 500.0000 mg | ORAL_TABLET | Freq: Four times a day (QID) | ORAL | Status: DC | PRN

## 2021-09-17 MED ORDER — MIDAZOLAM HCL 5 MG/5ML IJ SOLN
INTRAMUSCULAR | Status: DC | PRN
  Administered 2021-09-17: 2 mg via INTRAVENOUS

## 2021-09-17 MED ORDER — DEXAMETHASONE SODIUM PHOSPHATE 10 MG/ML IJ SOLN
8.0000 mg | Freq: Once | INTRAMUSCULAR | Status: AC
  Administered 2021-09-17: 4 mg via INTRAVENOUS

## 2021-09-17 MED ORDER — FENTANYL CITRATE PF 50 MCG/ML IJ SOSY
PREFILLED_SYRINGE | INTRAMUSCULAR | Status: AC
  Filled 2021-09-17: qty 2

## 2021-09-17 MED ORDER — LIDOCAINE 2% (20 MG/ML) 5 ML SYRINGE
INTRAMUSCULAR | Status: DC | PRN
  Administered 2021-09-17: 100 mg via INTRAVENOUS

## 2021-09-17 MED ORDER — FENTANYL CITRATE (PF) 100 MCG/2ML IJ SOLN
INTRAMUSCULAR | Status: DC | PRN
Start: 2021-09-17 — End: 2021-09-17
  Administered 2021-09-17 (×6): 50 ug via INTRAVENOUS
  Administered 2021-09-17: 100 ug via INTRAVENOUS

## 2021-09-17 MED ORDER — TRANEXAMIC ACID-NACL 1000-0.7 MG/100ML-% IV SOLN
1000.0000 mg | INTRAVENOUS | Status: AC
  Administered 2021-09-17: 1000 mg via INTRAVENOUS
  Filled 2021-09-17: qty 100

## 2021-09-17 MED ORDER — ONDANSETRON HCL 4 MG PO TABS: 4.0000 mg | ORAL_TABLET | Freq: Four times a day (QID) | ORAL | Status: DC | PRN

## 2021-09-17 MED ORDER — OXYCODONE HCL 5 MG PO TABS
5.0000 mg | ORAL_TABLET | ORAL | Status: DC | PRN
  Administered 2021-09-18: 10 mg via ORAL
  Filled 2021-09-17 (×3): qty 2

## 2021-09-17 MED ORDER — CEFAZOLIN SODIUM-DEXTROSE 2-4 GM/100ML-% IV SOLN
2.0000 g | Freq: Four times a day (QID) | INTRAVENOUS | Status: AC
  Administered 2021-09-17 – 2021-09-18 (×2): 2 g via INTRAVENOUS
  Filled 2021-09-17 (×2): qty 100

## 2021-09-17 MED ORDER — ONDANSETRON HCL 4 MG/2ML IJ SOLN: 4.0000 mg | Freq: Once | INTRAMUSCULAR | Status: DC | PRN

## 2021-09-17 MED ORDER — MENTHOL 3 MG MT LOZG: 1.0000 | LOZENGE | OROMUCOSAL | Status: DC | PRN

## 2021-09-17 MED ORDER — LAMOTRIGINE 100 MG PO TABS
100.0000 mg | ORAL_TABLET | Freq: Every day | ORAL | Status: DC
  Administered 2021-09-18: 100 mg via ORAL
  Filled 2021-09-17: qty 1

## 2021-09-17 MED ORDER — GLYCERIN-HYPROMELLOSE-PEG 400 0.2-0.2-1 % OP SOLN: 1.0000 [drp] | Freq: Three times a day (TID) | OPHTHALMIC | Status: DC

## 2021-09-17 MED ORDER — SODIUM CHLORIDE 0.9 % IV SOLN: INTRAVENOUS | Status: DC

## 2021-09-17 MED ORDER — HYDROMORPHONE HCL 1 MG/ML IJ SOLN
0.5000 mg | INTRAMUSCULAR | Status: DC | PRN
  Filled 2021-09-17: qty 1

## 2021-09-17 MED ORDER — POLYETHYLENE GLYCOL 3350 17 G PO PACK: 17.0000 g | PACK | Freq: Every day | ORAL | Status: DC | PRN

## 2021-09-17 MED ORDER — DEXMEDETOMIDINE (PRECEDEX) IN NS 20 MCG/5ML (4 MCG/ML) IV SYRINGE
PREFILLED_SYRINGE | INTRAVENOUS | Status: DC | PRN
  Administered 2021-09-17: 8 ug via INTRAVENOUS
  Administered 2021-09-17: 12 ug via INTRAVENOUS

## 2021-09-17 MED ORDER — ATORVASTATIN CALCIUM 40 MG PO TABS
80.0000 mg | ORAL_TABLET | Freq: Every day | ORAL | Status: DC
Start: 2021-09-17 — End: 2021-09-18
  Administered 2021-09-17 – 2021-09-18 (×2): 80 mg via ORAL
  Filled 2021-09-17 (×2): qty 2

## 2021-09-17 MED ORDER — SODIUM CHLORIDE 0.9 % IR SOLN
Status: DC | PRN
  Administered 2021-09-17: 1000 mL

## 2021-09-17 MED ORDER — DEXMEDETOMIDINE (PRECEDEX) IN NS 20 MCG/5ML (4 MCG/ML) IV SYRINGE
PREFILLED_SYRINGE | INTRAVENOUS | Status: AC
  Filled 2021-09-17: qty 5

## 2021-09-17 MED ORDER — INSULIN ASPART 100 UNIT/ML IJ SOLN: 0.0000 [IU] | Freq: Three times a day (TID) | INTRAMUSCULAR | Status: DC

## 2021-09-17 MED ORDER — FLUTICASONE PROPIONATE 50 MCG/ACT NA SUSP
1.0000 | Freq: Every day | NASAL | Status: DC | PRN
  Filled 2021-09-17: qty 16

## 2021-09-17 MED ORDER — LACTATED RINGERS IV SOLN: INTRAVENOUS | Status: DC

## 2021-09-17 MED ORDER — PROPOFOL 10 MG/ML IV BOLUS
INTRAVENOUS | Status: DC | PRN
  Administered 2021-09-17: 20 mg via INTRAVENOUS
  Administered 2021-09-17: 200 mg via INTRAVENOUS

## 2021-09-17 MED ORDER — DOCUSATE SODIUM 100 MG PO CAPS
100.0000 mg | ORAL_CAPSULE | Freq: Two times a day (BID) | ORAL | Status: DC
Start: 2021-09-17 — End: 2021-09-18
  Administered 2021-09-17 – 2021-09-18 (×2): 100 mg via ORAL
  Filled 2021-09-17 (×2): qty 1

## 2021-09-17 MED ORDER — CHLORHEXIDINE GLUCONATE 0.12 % MT SOLN
15.0000 mL | Freq: Once | OROMUCOSAL | Status: AC
  Administered 2021-09-17: 15 mL via OROMUCOSAL

## 2021-09-17 MED ORDER — FERROUS SULFATE 325 (65 FE) MG PO TABS
325.0000 mg | ORAL_TABLET | Freq: Three times a day (TID) | ORAL | Status: DC
  Administered 2021-09-18: 325 mg via ORAL
  Filled 2021-09-17: qty 1

## 2021-09-17 MED ORDER — DEXAMETHASONE SODIUM PHOSPHATE 10 MG/ML IJ SOLN
INTRAMUSCULAR | Status: AC
  Filled 2021-09-17: qty 1

## 2021-09-17 MED ORDER — BISACODYL 10 MG RE SUPP: 10.0000 mg | Freq: Every day | RECTAL | Status: DC | PRN

## 2021-09-17 MED ORDER — OXYCODONE HCL 5 MG PO TABS
10.0000 mg | ORAL_TABLET | ORAL | Status: DC | PRN
  Administered 2021-09-18: 10 mg via ORAL

## 2021-09-17 MED ORDER — TRANEXAMIC ACID-NACL 1000-0.7 MG/100ML-% IV SOLN
1000.0000 mg | Freq: Once | INTRAVENOUS | Status: AC
  Administered 2021-09-17: 1000 mg via INTRAVENOUS
  Filled 2021-09-17: qty 100

## 2021-09-17 MED ORDER — ONDANSETRON HCL 4 MG/2ML IJ SOLN
INTRAMUSCULAR | Status: AC
  Filled 2021-09-17: qty 2

## 2021-09-17 MED ORDER — ACETAMINOPHEN 500 MG PO TABS
1000.0000 mg | ORAL_TABLET | Freq: Once | ORAL | Status: AC
  Administered 2021-09-17: 1000 mg via ORAL
  Filled 2021-09-17: qty 2

## 2021-09-17 MED ORDER — PROPOFOL 10 MG/ML IV BOLUS
INTRAVENOUS | Status: AC
  Filled 2021-09-17: qty 20

## 2021-09-17 MED ORDER — PHENOL 1.4 % MT LIQD: 1.0000 | OROMUCOSAL | Status: DC | PRN

## 2021-09-17 MED ORDER — OXYCODONE HCL 5 MG/5ML PO SOLN: 5.0000 mg | Freq: Once | ORAL | Status: DC | PRN

## 2021-09-17 MED ORDER — METOCLOPRAMIDE HCL 5 MG PO TABS: 5.0000 mg | ORAL_TABLET | Freq: Three times a day (TID) | ORAL | Status: DC | PRN

## 2021-09-17 MED ORDER — CEFAZOLIN SODIUM-DEXTROSE 2-4 GM/100ML-% IV SOLN
2.0000 g | INTRAVENOUS | Status: AC
  Administered 2021-09-17: 2 g via INTRAVENOUS
  Filled 2021-09-17: qty 100

## 2021-09-17 MED ORDER — FENTANYL CITRATE PF 50 MCG/ML IJ SOSY
PREFILLED_SYRINGE | INTRAMUSCULAR | Status: AC
  Filled 2021-09-17: qty 1

## 2021-09-17 MED ORDER — OXYCODONE HCL 5 MG PO TABS: 5.0000 mg | ORAL_TABLET | Freq: Once | ORAL | Status: DC | PRN

## 2021-09-17 MED ORDER — ORAL CARE MOUTH RINSE: 15.0000 mL | Freq: Once | OROMUCOSAL | Status: AC

## 2021-09-17 MED ORDER — MIDAZOLAM HCL 2 MG/2ML IJ SOLN
INTRAMUSCULAR | Status: AC
  Filled 2021-09-17: qty 2

## 2021-09-17 MED ORDER — ASPIRIN 81 MG PO CHEW
81.0000 mg | CHEWABLE_TABLET | Freq: Two times a day (BID) | ORAL | Status: DC
  Administered 2021-09-17 – 2021-09-18 (×2): 81 mg via ORAL
  Filled 2021-09-17 (×2): qty 1

## 2021-09-17 MED ORDER — FENTANYL CITRATE PF 50 MCG/ML IJ SOSY
25.0000 ug | PREFILLED_SYRINGE | INTRAMUSCULAR | Status: DC | PRN
  Administered 2021-09-17 (×3): 50 ug via INTRAVENOUS

## 2021-09-17 MED ORDER — MIDAZOLAM HCL 2 MG/2ML IJ SOLN: 1.0000 mg | INTRAMUSCULAR | Status: DC

## 2021-09-17 SURGICAL SUPPLY — 57 items
BAG COUNTER SPONGE SURGICOUNT (BAG) ×1 IMPLANT
BAG ZIPLOCK 12X15 (MISCELLANEOUS) ×2 IMPLANT
BANDAGE ESMARK 6X9 LF (GAUZE/BANDAGES/DRESSINGS) IMPLANT
BLADE SAW SGTL 11.0X1.19X90.0M (BLADE) IMPLANT
BNDG ELASTIC 6X5.8 VLCR STR LF (GAUZE/BANDAGES/DRESSINGS) ×2 IMPLANT
BNDG ESMARK 6X9 LF (GAUZE/BANDAGES/DRESSINGS)
COVER MAYO STAND STRL (DRAPES) IMPLANT
COVER SURGICAL LIGHT HANDLE (MISCELLANEOUS) ×2 IMPLANT
CUFF TOURN SGL QUICK 34 (TOURNIQUET CUFF) ×1
CUFF TRNQT CYL 34X4.125X (TOURNIQUET CUFF) IMPLANT
DERMABOND ADVANCED (GAUZE/BANDAGES/DRESSINGS) ×1
DERMABOND ADVANCED .7 DNX12 (GAUZE/BANDAGES/DRESSINGS) ×1 IMPLANT
DRAPE C-ARM 42X120 X-RAY (DRAPES) IMPLANT
DRAPE OEC MINIVIEW 54X84 (DRAPES) IMPLANT
DRAPE ORTHO SPLIT 77X108 STRL (DRAPES) ×2
DRAPE POUCH INSTRU U-SHP 10X18 (DRAPES) ×2 IMPLANT
DRAPE SHEET LG 3/4 BI-LAMINATE (DRAPES) ×1 IMPLANT
DRAPE STERI IOBAN 125X83 (DRAPES) IMPLANT
DRAPE SURG ORHT 6 SPLT 77X108 (DRAPES) ×2 IMPLANT
DRAPE U-SHAPE 47X51 STRL (DRAPES) ×2 IMPLANT
DRSG AQUACEL AG ADV 3.5X10 (GAUZE/BANDAGES/DRESSINGS) ×1 IMPLANT
DRSG AQUACEL AG ADV 3.5X14 (GAUZE/BANDAGES/DRESSINGS) ×1 IMPLANT
DRSG EMULSION OIL 3X16 NADH (GAUZE/BANDAGES/DRESSINGS) ×2 IMPLANT
DRSG PAD ABDOMINAL 8X10 ST (GAUZE/BANDAGES/DRESSINGS) ×2 IMPLANT
DURAPREP 26ML APPLICATOR (WOUND CARE) ×2 IMPLANT
ELECT REM PT RETURN 15FT ADLT (MISCELLANEOUS) ×2 IMPLANT
FACESHIELD WRAPAROUND (MASK) ×6 IMPLANT
FACESHIELD WRAPAROUND OR TEAM (MASK) ×3 IMPLANT
GAUZE SPONGE 4X4 12PLY STRL (GAUZE/BANDAGES/DRESSINGS) ×2 IMPLANT
GLOVE SURG ENC MOIS LTX SZ6 (GLOVE) ×2 IMPLANT
GLOVE SURG SYN 7.5  E (GLOVE) ×1
GLOVE SURG SYN 7.5 E (GLOVE) ×1 IMPLANT
GLOVE SURG SYN 7.5 PF PI (GLOVE) ×1 IMPLANT
GLOVE SURG UNDER LTX SZ6.5 (GLOVE) IMPLANT
GLOVE SURG UNDER POLY LF SZ7.5 (GLOVE) ×6 IMPLANT
GOWN STRL REUS W/TWL LRG LVL3 (GOWN DISPOSABLE) ×4 IMPLANT
HANDPIECE INTERPULSE COAX TIP (DISPOSABLE) ×1
KIT BASIN OR (CUSTOM PROCEDURE TRAY) ×2 IMPLANT
KIT TURNOVER KIT A (KITS) IMPLANT
MANIFOLD NEPTUNE II (INSTRUMENTS) ×2 IMPLANT
NS IRRIG 1000ML POUR BTL (IV SOLUTION) ×2 IMPLANT
PACK TOTAL JOINT (CUSTOM PROCEDURE TRAY) ×2 IMPLANT
PROTECTOR NERVE ULNAR (MISCELLANEOUS) ×2 IMPLANT
SET HNDPC FAN SPRY TIP SCT (DISPOSABLE) IMPLANT
STAPLER VISISTAT 35W (STAPLE) IMPLANT
STRIP CLOSURE SKIN 1/2X4 (GAUZE/BANDAGES/DRESSINGS) ×4 IMPLANT
SUCTION FRAZIER HANDLE 10FR (MISCELLANEOUS) ×1
SUCTION TUBE FRAZIER 10FR DISP (MISCELLANEOUS) ×1 IMPLANT
SUT MNCRL AB 4-0 PS2 18 (SUTURE) ×2 IMPLANT
SUT STRATAFIX PDS+ 0 24IN (SUTURE) ×2 IMPLANT
SUT VIC AB 1 CT1 36 (SUTURE) ×6 IMPLANT
SUT VIC AB 2-0 CT1 27 (SUTURE) ×2
SUT VIC AB 2-0 CT1 TAPERPNT 27 (SUTURE) ×1 IMPLANT
TOWEL OR 17X26 10 PK STRL BLUE (TOWEL DISPOSABLE) ×4 IMPLANT
TRAY FOLEY MTR SLVR 16FR STAT (SET/KITS/TRAYS/PACK) ×2 IMPLANT
WATER STERILE IRR 1000ML POUR (IV SOLUTION) ×2 IMPLANT
WRAP KNEE MAXI GEL POST OP (GAUZE/BANDAGES/DRESSINGS) ×1 IMPLANT

## 2021-09-17 NOTE — Anesthesia Procedure Notes (Signed)
Procedure Name: LMA Insertion Date/Time: 09/17/2021 12:51 PM Performed by: West Pugh, CRNA Pre-anesthesia Checklist: Patient identified, Emergency Drugs available, Suction available, Patient being monitored and Timeout performed Patient Re-evaluated:Patient Re-evaluated prior to induction Oxygen Delivery Method: Circle system utilized Preoxygenation: Pre-oxygenation with 100% oxygen Induction Type: IV induction LMA: LMA with gastric port inserted LMA Size: 4.5 and 4.0 Number of attempts: 1 Placement Confirmation: positive ETCO2 Tube secured with: Tape Dental Injury: Teeth and Oropharynx as per pre-operative assessment

## 2021-09-17 NOTE — Transfer of Care (Signed)
Immediate Anesthesia Transfer of Care Note  Patient: Meghan Wilson  Procedure(s) Performed: Removal of hardware from right tibia (Right)  Patient Location: PACU  Anesthesia Type:General  Level of Consciousness: awake, drowsy and patient cooperative  Airway & Oxygen Therapy: Patient Spontanous Breathing and Patient connected to face mask oxygen  Post-op Assessment: Report given to RN and Post -op Vital signs reviewed and stable  Post vital signs: Reviewed and stable  Last Vitals:  Vitals Value Taken Time  BP 145/81 09/17/21 1415  Temp    Pulse 98 09/17/21 1416  Resp 16 09/17/21 1416  SpO2 99 % 09/17/21 1416  Vitals shown include unvalidated device data.  Last Pain:  Vitals:   09/17/21 1045  TempSrc: Oral         Complications: No notable events documented.

## 2021-09-17 NOTE — Anesthesia Preprocedure Evaluation (Signed)
Anesthesia Evaluation  Patient identified by MRN, date of birth, ID band Patient awake    Reviewed: Allergy & Precautions, NPO status , Patient's Chart, lab work & pertinent test results  History of Anesthesia Complications Negative for: history of anesthetic complications  Airway Mallampati: II  TM Distance: >3 FB Neck ROM: Full    Dental  (+) Dental Advisory Given, Teeth Intact   Pulmonary sleep apnea , former smoker,    Pulmonary exam normal        Cardiovascular negative cardio ROS Normal cardiovascular exam     Neuro/Psych Anxiety Depression Bipolar Disorder negative neurological ROS     GI/Hepatic Neg liver ROS, GERD  ,  Endo/Other  diabetes, Type 2, Oral Hypoglycemic Agents  Renal/GU negative Renal ROS  negative genitourinary   Musculoskeletal  (+) Arthritis ,   Abdominal   Peds  Hematology negative hematology ROS (+)   Anesthesia Other Findings   Reproductive/Obstetrics                           Anesthesia Physical Anesthesia Plan  ASA: 2  Anesthesia Plan: General   Post-op Pain Management: Tylenol PO (pre-op) and Toradol IV (intra-op)   Induction: Intravenous  PONV Risk Score and Plan: 3 and Ondansetron, Dexamethasone, Midazolam and Treatment may vary due to age or medical condition  Airway Management Planned: LMA  Additional Equipment: None  Intra-op Plan:   Post-operative Plan: Extubation in OR  Informed Consent: I have reviewed the patients History and Physical, chart, labs and discussed the procedure including the risks, benefits and alternatives for the proposed anesthesia with the patient or authorized representative who has indicated his/her understanding and acceptance.     Dental advisory given  Plan Discussed with:   Anesthesia Plan Comments:         Anesthesia Quick Evaluation

## 2021-09-17 NOTE — Plan of Care (Signed)

## 2021-09-17 NOTE — Interval H&P Note (Signed)
History and Physical Interval Note:  09/17/2021 11:26 AM  Meghan Wilson  has presented today for surgery, with the diagnosis of Status post right tibial plateau open reduction internal fixation and right knee osteoarthritis.  The various methods of treatment have been discussed with the patient and family. After consideration of risks, benefits and other options for treatment, the patient has consented to  Procedure(s): Removal of hardware from right tibia (Right) as a surgical intervention.  The patient's history has been reviewed, patient examined, no change in status, stable for surgery.  I have reviewed the patient's chart and labs.  Questions were answered to the patient's satisfaction.     Mauri Pole

## 2021-09-17 NOTE — Op Note (Signed)
NAMESHEREDA, GRAW MEDICAL RECORD NO: [id number] ACCOUNT NO: [id number] DATE OF BIRTH: 1960-12-06 FACILITY: [facility]: [id number] PHYSICIAN: Pietro Cassis. 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  PROCEDURE:  Removal of right proximal tibial hardware including a medial and lateral plate with 10 screws.  SURGEON:  Pietro Cassis. Alvan Dame, MD  ASSISTANT:  Costella Hatcher, PA-C.  Note that Meghan Wilson was present for the entirety of the case for preoperative positioning, perioperative management of the operative extremity and general facilitation of the case and primary wound closure.  ANESTHESIA:  General.  COMPLICATIONS:  None.  Tourniquet was utilized at 300 mmHg due to elevated high blood pressure during the procedure and was up for a total of 26 minutes.  DRAINS:  None.  BLOOD LOSS:  Less than 100 mL.  INDICATION OF THE PROCEDURE: The patient is a 61 year old female referred for evaluation of her right knee posttraumatic osteoarthritis.  She had tried and failed conservative measures with persistent and recurring symptoms and challenges.  She had a  history of a proximal tibia fracture with subsequent development of posttraumatic arthritis.  At the time of our discussion, we reviewed the need to remove hardware with my recommendation to remove the hardware prior to surgery to allow her incisions to  heal and to make certain that hardware comes out without challenge and without complication prior to proceeding with arthroplasty.  We reviewed the risks of infection, DVT being fairly minimal.  We discussed the possibility of fracture through a screw  hole.  We discussed healing.  We discussed timing prior to proceeding with arthroplasty.  Given these discussions, she elects to proceed for hardware  removal in a staged fashion for total knee arthroplasty.  Consent was obtained from the benefit of pain  relief and removal of hardware.  DESCRIPTION OF PROCEDURE:  The patient was brought to the operative theater.  Once adequate anesthesia, preoperative antibiotics, Ancef administered as well as tranexamic acid she was positioned supine with a proximal thigh tourniquet placed.  The right  lower extremity was then prepped and draped in sterile fashion.  A timeout was performed identifying the patient, planned procedure, and extremity.  Initially, the tourniquet was elevated to 225 mmHg with a systolic blood pressure of 105.  During the  procedure and during the onset her blood pressure elevated to 128-786 range systolically.  For that reason, I did have to bump the tourniquet up to 300 mmHg.  We identified her incision and made an incision over the anterior aspect of her leg.  As I  created soft tissue exposure medially some of the screws in this medial plate were readily identifiable.  I exposed the plate further removing all screws without complication.  The plate was then removed after removing a little bit of overgrowth of the  bone on the distal aspect of the plate.  I then used a rongeur to remove any abnormal bone from the screw holes around the edge of the plate.  Once this was done, I focused laterally.  I did open up the lateral compartment over the anterior border of the  plate.  We identified the extent of the plate, which did include some overgrowth of bone over the distal 2 screws.  All of the screws on this lateral plate were removed without difficulty.  I then used an osteotome to  free up the distal aspect of the  plate.  The plate was then removed without difficulty.  Again, a rongeur was used to remove any bone spicules from the screw hole sites and around the edge of the plate.  Once I felt that everything was removed and smooth we irrigated the wound with  normal saline solution.  I  then reapproximated the lateral compartment fascia using #1 Vicryl in an interrupted and running fashion.  The medial compartment did not have a fascial layer to close.  We then let the tourniquet down after 26 minutes.  There  was no significant hemostasis required.  The subcutaneous layer was reapproximated using 2-0 Vicryl.  The skin was closed with running Monocryl.  The skin was cleaned, dry dressed sterilely using surgical glue and Aquacel dressing.  She will be  transitioned to the recovery room with plans for overnight observation for pain control and mobilization with physical therapy.  I will follow up with her in 2 weeks and determine timing for knee arthroplasty.   PUS D: 09/17/2021 2:07:31 pm T: 09/17/2021 4:42:00 pm  JOB: [id number]/ [id number]

## 2021-09-17 NOTE — Anesthesia Postprocedure Evaluation (Signed)
Anesthesia Post Note  Patient: Meghan Wilson  Procedure(s) Performed: Removal of hardware from right tibia (Right)     Patient location during evaluation: PACU Anesthesia Type: General Level of consciousness: awake and alert Pain management: pain level controlled Vital Signs Assessment: post-procedure vital signs reviewed and stable Respiratory status: spontaneous breathing, nonlabored ventilation and respiratory function stable Cardiovascular status: blood pressure returned to baseline and stable Postop Assessment: no apparent nausea or vomiting Anesthetic complications: no   No notable events documented.  Last Vitals:  Vitals:   09/17/21 1530 09/17/21 1545  BP: 131/79 137/78  Pulse: 92 96  Resp: 15 15  Temp:    SpO2: 98% 91%    Last Pain:  Vitals:   09/17/21 1545  TempSrc:   PainSc: Asleep                 Lidia Collum

## 2021-09-17 NOTE — Discharge Instructions (Signed)

## 2021-09-17 NOTE — Brief Op Note (Signed)
09/17/2021  1:59 PM  PATIENT:  [name]  61 y.o. female  PRE-OPERATIVE DIAGNOSIS:  Status post right tibial plateau open reduction internal fixation and right knee osteoarthritis  POST-OPERATIVE DIAGNOSIS:  Status post right tibial plateau open reduction internal fixation and right knee osteoarthritis  PROCEDURE:  Procedure(s): Removal of hardware from right tibia (Right)  SURGEON:  Surgeon(s) and Role:    Paralee Cancel, MD - Primary  PHYSICIAN ASSISTANT: Costella Hatcher, PA-C  ANESTHESIA:   general  EBL:  <100 cc  BLOOD ADMINISTERED:none  DRAINS: none   LOCAL MEDICATIONS USED:  NONE  SPECIMEN:  No Specimen  DISPOSITION OF SPECIMEN:  N/A  COUNTS:  YES  TOURNIQUET:   Total Tourniquet Time Documented: Thigh (Right) - 26 minutes Total: Thigh (Right) - 26 minutes   DICTATION: .Other Dictation: Dictation Number [id number]  PLAN OF CARE: Admit for overnight observation  PATIENT DISPOSITION:  PACU - hemodynamically stable.   Delay start of Pharmacological VTE agent (>24hrs) due to surgical blood loss or risk of bleeding: no

## 2021-09-18 ENCOUNTER — Encounter (HOSPITAL_COMMUNITY): Payer: Self-pay | Admitting: Orthopedic Surgery

## 2021-09-18 ENCOUNTER — Other Ambulatory Visit (HOSPITAL_COMMUNITY): Payer: Self-pay

## 2021-09-18 DIAGNOSIS — Z472 Encounter for removal of internal fixation device: Secondary | ICD-10-CM | POA: Diagnosis not present

## 2021-09-18 LAB — BASIC METABOLIC PANEL
Anion gap: 6 (ref 5–15)
BUN: 16 mg/dL (ref 8–23)
CO2: 23 mmol/L (ref 22–32)
Calcium: 8.7 mg/dL — ABNORMAL LOW (ref 8.9–10.3)
Chloride: 108 mmol/L (ref 98–111)
Creatinine, Ser: 0.59 mg/dL (ref 0.44–1.00)
GFR, Estimated: >60 mL/min (ref >60)
Glucose, Bld: 163 mg/dL — ABNORMAL HIGH (ref 70–99)
Potassium: 3.7 mmol/L (ref 3.5–5.1)
Sodium: 137 mmol/L (ref 135–145)

## 2021-09-18 LAB — CBC
HCT: 35.9 % — ABNORMAL LOW (ref 36.0–46.0)
Hemoglobin: 10.7 g/dL — ABNORMAL LOW (ref 12.0–15.0)
MCH: 26.2 pg (ref 26.0–34.0)
MCHC: 29.8 g/dL — ABNORMAL LOW (ref 30.0–36.0)
MCV: 87.8 fL (ref 80.0–100.0)
Platelets: 290 10*3/uL (ref 150–400)
RBC: 4.09 MIL/uL (ref 3.87–5.11)
RDW: 16.4 % — ABNORMAL HIGH (ref 11.5–15.5)
WBC: 12 10*3/uL — ABNORMAL HIGH (ref 4.0–10.5)
nRBC: 0 % (ref 0.0–0.2)

## 2021-09-18 LAB — GLUCOSE, CAPILLARY: Glucose-Capillary: 116 mg/dL — ABNORMAL HIGH (ref 70–99)

## 2021-09-18 MED ORDER — ONDANSETRON HCL 4 MG PO TABS
4.0000 mg | ORAL_TABLET | Freq: Four times a day (QID) | ORAL | 0 refills | Status: DC | PRN
  Filled 2021-09-18: qty 20, 5d supply, fill #0

## 2021-09-18 MED ORDER — OXYCODONE HCL 5 MG PO TABS
5.0000 mg | ORAL_TABLET | ORAL | 0 refills | Status: DC | PRN
Start: 2021-09-18 — End: 2021-10-02
  Filled 2021-09-18: qty 30, 3d supply, fill #0

## 2021-09-18 MED ORDER — ASPIRIN 81 MG PO CHEW
81.0000 mg | CHEWABLE_TABLET | Freq: Two times a day (BID) | ORAL | 0 refills | Status: AC
  Filled 2021-09-18: qty 56, 28d supply, fill #0

## 2021-09-18 MED ORDER — METHOCARBAMOL 500 MG PO TABS
500.0000 mg | ORAL_TABLET | Freq: Four times a day (QID) | ORAL | 0 refills | Status: DC | PRN
  Filled 2021-09-18: qty 30, 8d supply, fill #0

## 2021-09-18 NOTE — Progress Notes (Signed)
° °  Subjective: 1 Day Post-Op Procedure(s) (LRB): Removal of hardware from right tibia (Right) Patient reports pain as mild.   Patient seen in rounds for Dr. Alvan Dame. Patient is well, and has had no acute complaints or problems. No acute events overnight. Foley catheter removed. Patient ambulated to the bathroom today but has not been up with PT. She reports she has been very happy with her care here.  We will start therapy today.   Objective: Vital signs in last 24 hours: Temp:  [97.6 F (36.4 C)-98.8 F (37.1 C)] 98.3 F (36.8 C) (01/18 0553) Pulse Rate:  [85-104] 85 (01/18 0553) Resp:  [12-20] 18 (01/18 0553) BP: (108-156)/(58-127) 132/89 (01/18 0553) SpO2:  [91 %-100 %] 100 % (01/18 0553) Weight:  [92.4 kg] 92.4 kg (01/17 1658)  Intake/Output from previous day:  Intake/Output Summary (Last 24 hours) at 09/18/2021 0821 Last data filed at 09/18/2021 0600 Gross per 24 hour  Intake 2875.07 ml  Output 1150 ml  Net 1725.07 ml     Intake/Output this shift: No intake/output data recorded.  Labs: Recent Labs    09/18/21 0320  HGB 10.7*   Recent Labs    09/18/21 0320  WBC 12.0*  RBC 4.09  HCT 35.9*  PLT 290   Recent Labs    09/18/21 0320  NA 137  K 3.7  CL 108  CO2 23  BUN 16  CREATININE 0.59  GLUCOSE 163*  CALCIUM 8.7*   No results for input(s): LABPT, INR in the last 72 hours.  Exam: General - Patient is Alert and Oriented Extremity - Neurologically intact Sensation intact distally Intact pulses distally Dorsiflexion/Plantar flexion intact Dressing - dressing C/D/I Motor Function - intact, moving foot and toes well on exam.   Past Medical History:  Diagnosis Date   Anxiety    Arthritis    Bipolar depression ([place]) 09/2015   Depression 06/27/2021   Dizziness    GERD (gastroesophageal reflux disease)    History of colon polyps    History of kidney stones    History of patellar fracture 2016   History of tibial fracture 2016   Hyperlipidemia     Night terrors    Night terrors, adult 09/2015   Pain of right knee after injury 03/10/2018   Post concussion syndrome 12/20/2019   Pre-diabetes    Seasonal allergies 11/09/2020   Sleep apnea    doesnt wear c-pap    Assessment/Plan: 1 Day Post-Op Procedure(s) (LRB): Removal of hardware from right tibia (Right) Principal Problem:   S/P hardware removal, right tibia  Estimated body mass index is 36.08 kg/m as calculated from the following:   Height as of this encounter: 5\' 3"  (1.6 m).   Weight as of this encounter: 92.4 kg. Advance diet Up with therapy D/C IV fluids  DVT Prophylaxis - Aspirin Weight bearing as tolerated.  Plan is to go Home after hospital stay. Plan for discharge today following 1-2 sessions of PT as long as they are meeting their goals. We discussed a goal of using minimal pain medication as tolerated so as to not increase tolerance just prior to her TKA in February. Follow up in the office in 2 weeks.   Griffith Citron, PA-C Orthopedic Surgery [phone] 09/18/2021, 8:21 AM

## 2021-09-18 NOTE — TOC Transition Note (Signed)
Transition of Care [facility]) - CM/SW Discharge Note  Patient Details  Name: Meghan Wilson MRN: [id number] Date of Birth: [birth year]  [facility] CM/SW Contact:  Sherie Don, LCSW Phone Number: 09/18/2021, 9:26 AM  Clinical Narrative: Patient is expected to discharge home after working with PT. CSW met with patient to confirm discharge plan and needs. Patient will not participate in PT after discharge per H&P, but will need a rolling walker. [employer] delivered walker to patient's room. TOC signing off.  Final next level of care: Home/Self Care Barriers to Discharge: No Barriers Identified  Patient Goals and CMS Choice Patient states their goals for this hospitalization and ongoing recovery are:: Discharge home with rolling walker CMS Medicare.gov Compare Post Acute Care list provided to:: Patient Choice offered to / list presented to : Patient  Discharge Plan and Services        DME Arranged: Walker rolling DME Agency: [employer] Representative spoke with at DME Agency: Prearranged in orthopedist's office  Readmission Risk Interventions No flowsheet data found.

## 2021-09-18 NOTE — Progress Notes (Signed)
D/C instructions given to patient. Patient had no questions. NT will wheel patient out once she dressed.

## 2021-09-18 NOTE — Evaluation (Signed)
Physical Therapy Evaluation Patient Details Name: Meghan Wilson MRN: [id number] DOB: 09-14-1960 Today's Date: 09/18/2021  History of Present Illness  61 Y/O S/P retained  hardware right tibia. on 09/18/2021  Clinical Impression  Patient is ambulating in room without AD. Patient did ambulate in hall with RW.   Encouraged  muse of RW. Patient agreed. No further needs from PT     Recommendations for follow up therapy are one component of a multi-disciplinary discharge planning process, led by the attending physician.  Recommendations may be updated based on patient status, additional functional criteria and insurance authorization.  Follow Up Recommendations Follow physician's recommendations for discharge plan and follow up therapies    Assistance Recommended at Discharge Set up Supervision/Assistance  Patient can return home with the following  Assist for transportation    Equipment Recommendations Rolling walker (2 wheels)  Recommendations for Other Services       Functional Status Assessment Patient has had a recent decline in their functional status and demonstrates the ability to make significant improvements in function in a reasonable and predictable amount of time.     Precautions / Restrictions Precautions Precautions: Fall;Knee      Mobility  Bed Mobility               General bed mobility comments: up ad lib in room no device    Transfers Overall transfer level: Modified independent                      Ambulation/Gait Ambulation/Gait assistance: Modified independent (Device/Increase time) Gait Distance (Feet): 200 Feet Assistive device: Rolling walker (2 wheels) Gait Pattern/deviations: Step-through pattern   Gait velocity interpretation: <1.31 ft/sec, indicative of household ambulator   General Gait Details: patient up in room without Rw, Provided Rw for ambulation in  hall.  Stairs            Wheelchair Mobility    Modified Rankin  (Stroke Patients Only)       Balance Overall balance assessment: No apparent balance deficits (not formally assessed)                                           Pertinent Vitals/Pain Pain Assessment Pain Assessment: 0-10 Pain Score: 4  Pain Location: right leg Pain Descriptors / Indicators: Discomfort Pain Intervention(s): Monitored during session, Premedicated before session    Home Living Family/patient expects to be discharged to:: Private residence Living Arrangements: Spouse/significant other Available Help at Discharge: Family;Available 24 hours/day Type of Home: Apartment Home Access: Ramped entrance       Home Layout: One level Home Equipment: Conservation officer, nature (2 wheels) Additional Comments: RW delivered    Prior Function Prior Level of Function : Independent/Modified Independent                     Hand Dominance        Extremity/Trunk Assessment   Upper Extremity Assessment Upper Extremity Assessment: Overall WFL for tasks assessed    Lower Extremity Assessment Lower Extremity Assessment: RLE deficits/detail RLE Deficits / Details: WBAT, knee flexion ~ 60    Cervical / Trunk Assessment Cervical / Trunk Assessment: Normal  Communication   Communication: No difficulties  Cognition Arousal/Alertness: Awake/alert Behavior During Therapy: WFL for tasks assessed/performed Overall Cognitive Status: Within Functional Limits for tasks assessed  General Comments      Exercises     Assessment/Plan    PT Assessment Patient does not need any further PT services  PT Problem List         PT Treatment Interventions      PT Goals (Current goals can be found in the Care Plan section)  Acute Rehab PT Goals Patient Stated Goal: go home PT Goal Formulation: All assessment and education complete, DC therapy    Frequency       Co-evaluation               AM-PAC  PT "6 Clicks" Mobility  Outcome Measure Help needed turning from your back to your side while in a flat bed without using bedrails?: None Help needed moving from lying on your back to sitting on the side of a flat bed without using bedrails?: None Help needed moving to and from a bed to a chair (including a wheelchair)?: None Help needed standing up from a chair using your arms (e.g., wheelchair or bedside chair)?: None Help needed to walk in hospital room?: None Help needed climbing 3-5 steps with a railing? : A Little 6 Click Score: 23    End of Session   Activity Tolerance: Patient tolerated treatment well Patient left: with family/visitor present (in room,) Nurse Communication: Mobility status PT Visit Diagnosis: Difficulty in walking, not elsewhere classified (R26.2);Pain Pain - Right/Left: Right Pain - part of body: Knee    Time: 1050-1100 PT Time Calculation (min) (ACUTE ONLY): 10 min   Charges:   PT Evaluation $PT Eval Low Complexity: 1 Low          Tresa Endo PT Acute Rehabilitation Services Pager [phone] Office [phone]   Claretha Cooper 09/18/2021, 11:05 AM

## 2021-09-24 ENCOUNTER — Other Ambulatory Visit (HOSPITAL_COMMUNITY): Payer: Self-pay

## 2021-09-26 NOTE — Progress Notes (Signed)
Sent message, via epic in basket, requesting orders in epic from surgeon.  

## 2021-09-27 NOTE — Discharge Summary (Signed)
Patient ID: Meghan Wilson MRN: [id number] DOB/AGE: [birth year] 61 y.o.  Admit date: 09/17/2021 Discharge date: 09/18/2021  Admission Diagnoses:  Retained hardware, right tibia  Discharge Diagnoses:  Principal Problem:   S/P hardware removal, right tibia   Past Medical History:  Diagnosis Date   Anxiety    Arthritis    Bipolar depression ([place]) 09/2015   Depression 06/27/2021   Dizziness    GERD (gastroesophageal reflux disease)    History of colon polyps    History of kidney stones    History of patellar fracture 2016   History of tibial fracture 2016   Hyperlipidemia    Night terrors    Night terrors, adult 09/2015   Pain of right knee after injury 03/10/2018   Post concussion syndrome 12/20/2019   Pre-diabetes    Seasonal allergies 11/09/2020   Sleep apnea    doesnt wear c-pap    Surgeries: Procedure(s): Removal of hardware from right tibia on 09/17/2021   Consultants:   Discharged Condition: Improved  Hospital Course: Meghan Wilson is an 61 y.o. female who was admitted 09/17/2021 for operative treatment ofS/P hardware removal. Patient has severe unremitting pain that affects sleep, daily activities, and work/hobbies. After pre-op clearance the patient was taken to the operating room on 09/17/2021 and underwent  Procedure(s): Removal of hardware from right tibia.    Patient was given perioperative antibiotics:  Anti-infectives (From admission, onward)    Start     Dose/Rate Route Frequency Ordered Stop   09/17/21 1900  ceFAZolin (ANCEF) IVPB 2g/100 mL premix        2 g 200 mL/hr over 30 Minutes Intravenous Every 6 hours 09/17/21 1633 09/18/21 0215   09/17/21 1045  ceFAZolin (ANCEF) IVPB 2g/100 mL premix        2 g 200 mL/hr over 30 Minutes Intravenous On call to O.R. 09/17/21 1035 09/17/21 1322        Patient was given sequential compression devices, early ambulation, and chemoprophylaxis to prevent DVT. Patient worked with PT and was meeting their goals  regarding safe ambulation and transfers.  Patient benefited maximally from hospital stay and there were no complications.    Recent vital signs: No data found.   Recent laboratory studies: No results for input(s): WBC, HGB, HCT, PLT, NA, K, CL, CO2, BUN, CREATININE, GLUCOSE, INR, CALCIUM in the last 72 hours.  Invalid input(s): PT, 2   Discharge Medications:   Allergies as of 09/18/2021       Reactions   Depakote [divalproex Sodium] Other (See Comments)   Hair fell out   Ibuprofen Nausea And Vomiting   Prednisone Other (See Comments)   Constipation, nervousness All steroids   Latex Rash        Medication List     TAKE these medications    acetaminophen 325 MG tablet Commonly known as: TYLENOL TAKE 1 TO 2 TABLETS EVERY 6 HOURS AS NEEDED.   aspirin 81 MG chewable tablet Chew 1 tablet (81 mg total) by mouth 2 (two) times daily for 28 days.   atorvastatin 80 MG tablet Commonly known as: LIPITOR Take 1 tablet (80 mg total) by mouth daily.   B-12 PO Take 1,000 mcg by mouth 2 (two) times daily.   docusate sodium 100 MG capsule Commonly known as: COLACE Take 100 mg by mouth See admin instructions. Take twice a day every other day   Dry Eye Relief Drops 0.2-0.2-1 % Soln Generic drug: Glycerin-Hypromellose-PEG 400 Place 1 drop into both eyes 3 (three) times  daily.   FLAX SEEDS PO Take 500 mg by mouth 2 (two) times daily.   fluticasone 50 MCG/ACT nasal spray Commonly known as: FLONASE Place 1 spray into both nostrils daily as needed for allergies or rhinitis What changed:  when to take this reasons to take this   IBgard 90 MG Cpcr Generic drug: Peppermint Oil Take as directed What changed:  how much to take how to take this when to take this   lamoTRIgine 100 MG tablet Commonly known as: LaMICtal Take 1 tablet by mouth once a day   Linzess 145 MCG Caps capsule Generic drug: linaclotide Take 1 capsule (145 mcg total) by mouth daily before breakfast.    metFORMIN 500 MG 24 hr tablet Commonly known as: GLUCOPHAGE-XR TAKE 1 TABLET (500 MG TOTAL) BY MOUTH DAILY WITH BREAKFAST.   methocarbamol 500 MG tablet Commonly known as: ROBAXIN Take 1 tablet (500 mg total) by mouth every 6 (six) hours as needed for muscle spasms.   ondansetron 4 MG disintegrating tablet Commonly known as: ZOFRAN-ODT DISSOLVE 1 TABLET UNDER THE TONGUE EVERY 8 HOURS AS NEEDED FOR NAUSEA AND VOMITING.   ondansetron 4 MG tablet Commonly known as: ZOFRAN Take 1 tablet (4 mg total) by mouth every 6 (six) hours as needed for nausea.   Oscimin 0.125 MG Subl Generic drug: Hyoscyamine Sulfate SL Place 1 tablet under the tongue every 8 (eight) hours as needed.   oxyCODONE 5 MG immediate release tablet Commonly known as: Oxy IR/ROXICODONE Take 1-2 tablets (5-10 mg total) by mouth every 4 (four) hours as needed for severe pain.   pantoprazole 40 MG tablet Commonly known as: PROTONIX Take 1 tablet (40 mg total) by mouth daily. Try to taper off and then take only as needed   prazosin 1 MG capsule Commonly known as: MINIPRESS Take 2 capsules by mouth at bedtime What changed:  when to take this reasons to take this   triamcinolone cream 0.1 % Commonly known as: KENALOG Apply 1 application topically daily. Apply underneath breast as needed What changed:  when to take this reasons to take this additional instructions   triamcinolone ointment 0.5 % Commonly known as: KENALOG Apply 1 application topically daily. Apply to hands as needed daily What changed:  when to take this reasons to take this additional instructions   Vitamin D3 125 MCG (5000 UT) Tabs Take 5,000 Units by mouth daily.       ASK your doctor about these medications    Accu-Chek FastClix Lancets Misc USE 1 TO CHECK BLOOD SUGAR UP TO 7 TIMES A WEEK. Ask about: Should I take this medication?   Accu-Chek Guide test strip Generic drug: glucose blood USE TO CHECK BLOOD GLUCOSE UP TO 7 TIMES  A WEEK. Ask about: Should I take this medication?               Discharge Care Instructions  (From admission, onward)           Start     Ordered   09/18/21 0000  Change dressing       Comments: Maintain surgical dressing until follow up in the clinic. If the edges start to pull up, may reinforce with tape. If the dressing is no longer working, may remove and cover with gauze and tape, but must keep the area dry and clean.  Call with any questions or concerns.   09/18/21 0826            Diagnostic Studies: No results found.  Disposition: Discharge disposition: 01-Home or Self Care       Discharge Instructions     Call MD / Call 911   Complete by: As directed    If you experience chest pain or shortness of breath, CALL 911 and be transported to the hospital emergency room.  If you develope a fever above 101 F, pus (white drainage) or increased drainage or redness at the wound, or calf pain, call your surgeon's office.   Change dressing   Complete by: As directed    Maintain surgical dressing until follow up in the clinic. If the edges start to pull up, may reinforce with tape. If the dressing is no longer working, may remove and cover with gauze and tape, but must keep the area dry and clean.  Call with any questions or concerns.   Constipation Prevention   Complete by: As directed    Drink plenty of fluids.  Prune juice may be helpful.  You may use a stool softener, such as Colace (over the counter) 100 mg twice a day.  Use MiraLax (over the counter) for constipation as needed.   Diet - low sodium heart healthy   Complete by: As directed    Increase activity slowly as tolerated   Complete by: As directed    Weight bearing as tolerated with assist device (walker, cane, etc) as directed, use it as long as suggested by your surgeon or therapist, typically at least 4-6 weeks.   Post-operative opioid taper instructions:   Complete by: As directed    POST-OPERATIVE  OPIOID TAPER INSTRUCTIONS: It is important to wean off of your opioid medication as soon as possible. If you do not need pain medication after your surgery it is ok to stop day one. Opioids include: Codeine, Hydrocodone(Norco, Vicodin), Oxycodone(Percocet, oxycontin) and hydromorphone amongst others.  Long term and even short term use of opiods can cause: Increased pain response Dependence Constipation Depression Respiratory depression And more.  Withdrawal symptoms can include Flu like symptoms Nausea, vomiting And more Techniques to manage these symptoms Hydrate well Eat regular healthy meals Stay active Use relaxation techniques(deep breathing, meditating, yoga) Do Not substitute Alcohol to help with tapering If you have been on opioids for less than two weeks and do not have pain than it is ok to stop all together.  Plan to wean off of opioids This plan should start within one week post op of your joint replacement. Maintain the same interval or time between taking each dose and first decrease the dose.  Cut the total daily intake of opioids by one tablet each day Next start to increase the time between doses. The last dose that should be eliminated is the evening dose.      TED hose   Complete by: As directed    Use stockings (TED hose) for 2 weeks on both leg(s).  You may remove them at night for sleeping.        Follow-up Information     Paralee Cancel, MD. Schedule an appointment as soon as possible for a visit in 2 week(s).   Specialty: Orthopedic Surgery Contact information: [address] [place] [address] [phone]                  Signed: Irving Copas 09/27/2021, 8:16 AM

## 2021-10-02 ENCOUNTER — Other Ambulatory Visit (HOSPITAL_COMMUNITY): Payer: Self-pay

## 2021-10-02 DIAGNOSIS — G8929 Other chronic pain: Secondary | ICD-10-CM

## 2021-10-02 HISTORY — DX: Other chronic pain: G89.29

## 2021-10-02 MED ORDER — OXYCODONE HCL 5 MG PO TABS
5.0000 mg | ORAL_TABLET | Freq: Every day | ORAL | 0 refills | Status: DC | PRN
Start: 2021-10-02 — End: 2021-10-18
  Filled 2021-10-02: qty 20, 10d supply, fill #0
  Filled 2021-10-02: qty 14, 7d supply, fill #0

## 2021-10-03 ENCOUNTER — Other Ambulatory Visit (HOSPITAL_COMMUNITY): Payer: Self-pay

## 2021-10-03 NOTE — Progress Notes (Signed)
PCP -  Cardiologist -   PPM/ICD -  Device Orders -  Rep Notified -   Chest x-ray -  EKG -  Stress Test -  ECHO -  Cardiac Cath -   Sleep Study -  CPAP -   Fasting Blood Sugar -  Checks Blood Sugar _____ times a day  Blood Thinner Instructions: Aspirin Instructions:  ERAS Protcol - PRE-SURGERY Ensure or G2-   COVID TEST-  COVID vaccine -  Activity-- Anesthesia review:   Patient denies shortness of breath, fever, cough and chest pain at PAT appointment   All instructions explained to the patient, with a verbal understanding of the material. Patient agrees to go over the instructions while at home for a better understanding. Patient also instructed to self quarantine after being tested for COVID-19. The opportunity to ask questions was provided.   

## 2021-10-04 ENCOUNTER — Other Ambulatory Visit: Payer: Self-pay

## 2021-10-04 ENCOUNTER — Encounter (HOSPITAL_COMMUNITY): Payer: Self-pay

## 2021-10-04 ENCOUNTER — Encounter (HOSPITAL_COMMUNITY)
Admission: RE | Admit: 2021-10-04 | Discharge: 2021-10-04 | Disposition: A | Discharge status: Home / Self Care | Payer: Medicaid Other | Source: Ambulatory Visit | Attending: Orthopedic Surgery | Admitting: Orthopedic Surgery

## 2021-10-04 DIAGNOSIS — Z20822 Contact with and (suspected) exposure to covid-19: Secondary | ICD-10-CM | POA: Insufficient documentation

## 2021-10-04 DIAGNOSIS — M1731 Unilateral post-traumatic osteoarthritis, right knee: Secondary | ICD-10-CM | POA: Diagnosis not present

## 2021-10-04 DIAGNOSIS — Z01812 Encounter for preprocedural laboratory examination: Secondary | ICD-10-CM | POA: Diagnosis present

## 2021-10-04 DIAGNOSIS — Z01818 Encounter for other preprocedural examination: Secondary | ICD-10-CM

## 2021-10-04 LAB — SURGICAL PCR SCREEN
MRSA, PCR: NEGATIVE
Staphylococcus aureus: NEGATIVE

## 2021-10-04 LAB — COMPREHENSIVE METABOLIC PANEL
ALT: 18 U/L (ref 0–44)
AST: 15 U/L (ref 15–41)
Albumin: 3.7 g/dL (ref 3.5–5.0)
Alkaline Phosphatase: 85 U/L (ref 38–126)
Anion gap: 9 (ref 5–15)
BUN: 13 mg/dL (ref 8–23)
CO2: 26 mmol/L (ref 22–32)
Calcium: 9.2 mg/dL (ref 8.9–10.3)
Chloride: 105 mmol/L (ref 98–111)
Creatinine, Ser: 0.67 mg/dL (ref 0.44–1.00)
GFR, Estimated: >60 mL/min (ref >60)
Glucose, Bld: 220 mg/dL — ABNORMAL HIGH (ref 70–99)
Potassium: 3.6 mmol/L (ref 3.5–5.1)
Sodium: 140 mmol/L (ref 135–145)
Total Bilirubin: 0.1 mg/dL — ABNORMAL LOW (ref 0.3–1.2)
Total Protein: 6.7 g/dL (ref 6.5–8.1)

## 2021-10-04 LAB — CBC
HCT: 39.7 % (ref 36.0–46.0)
Hemoglobin: 12.2 g/dL (ref 12.0–15.0)
MCH: 25.9 pg — ABNORMAL LOW (ref 26.0–34.0)
MCHC: 30.7 g/dL (ref 30.0–36.0)
MCV: 84.3 fL (ref 80.0–100.0)
Platelets: 354 10*3/uL (ref 150–400)
RBC: 4.71 MIL/uL (ref 3.87–5.11)
RDW: 15.5 % (ref 11.5–15.5)
WBC: 8.6 10*3/uL (ref 4.0–10.5)
nRBC: 0 % (ref 0.0–0.2)

## 2021-10-04 LAB — TYPE AND SCREEN
ABO/RH(D): A POS
Antibody Screen: NEGATIVE

## 2021-10-04 LAB — GLUCOSE, CAPILLARY: Glucose-Capillary: 225 mg/dL — ABNORMAL HIGH (ref 70–99)

## 2021-10-04 NOTE — Progress Notes (Addendum)
PCP - Dr. Humphrey Rolls  Cardiologist - no  PPM/ICD -  Device Orders -  Rep Notified -   Chest x-ray -  EKG - 05-02-21 Stress Test -  ECHO -  Cardiac Cath -    Sleep Study -  CPAP - no  Fasting Blood Sugar - 98-120 Checks Blood Sugar ___3__ times a week  Blood Thinner Instructions: Aspirin Instructions:  ERAS Protcol - PRE-SURGERY Ensure or G2-   COVID TEST- 10-15-21 COVID vaccine -pfizer x3  Activity--Able to walk a flight of stairs without SOB Anesthesia review: OSA no cpap, Pre DM  Patient denies shortness of breath, fever, cough and chest pain at PAT appointment   All instructions explained to the patient, with a verbal understanding of the material. Patient agrees to go over the instructions while at home for a better understanding. Patient also instructed to self quarantine after being tested for COVID-19. The opportunity to ask questions was provided.

## 2021-10-04 NOTE — Patient Instructions (Signed)
DUE TO COVID-19 ONLY ONE VISITOR IS ALLOWED TO COME WITH YOU AND STAY IN THE WAITING ROOM ONLY DURING PRE OP AND PROCEDURE DAY OF SURGERY.   Up to two visitors ages 16+ are allowed at one time in a patient's room.  The visitors may rotate out with other people throughout the day.  Additionally, up to two children between the ages of 75 and 18 are allowed and do not count toward the number of allowed visitors.  Children within this age range must be accompanied by an adult visitor.  One adult visitor may remain with the patient overnight and must be in the room by 8 PM.  YOU NEED TO HAVE A COVID 19 TEST ON_2-14-23 @_    _____ THIS TEST MUST BE DONE BEFORE SURGERY,     COVID TESTING SITE    [place] COME IN THROUGH MAIN ENTRANCE BE SEATED INT THE LOBBY AREA TO THE RIGHT AS YOU COME IN THE MAIN ENTRANCE DIAL [phone] GIVE THEM YOUR NAME AND LET THEM KNOW YOU ARE HERE FOR COVID TESTING    ONCE YOUR COVID TEST IS COMPLETED,  PLEASE Wear a mask when in [place]           Your procedure is scheduled on: 10-17-21   Report to [facility] Main  Entrance   Report to admitting at       [place] AM     Call this number if you have problems the morning of surgery [phone]   Remember: NO SOLID FOOD AFTER MIDNIGHT THE NIGHT PRIOR TO SURGERY. NOTHING BY MOUTH EXCEPT CLEAR LIQUIDS UNTIL    0415 am  .   PLEASE FINISH g2 DRINK PER SURGEON ORDER  WHICH NEEDS TO BE COMPLETED AT        0415 am then nothing by mouth.     BRUSH YOUR TEETH MORNING OF SURGERY AND RINSE YOUR MOUTH OUT, NO CHEWING GUM CANDY OR MINTS.     Take these medicines the morning of surgery with A SIP OF WATER: oxycodone if needed, eye drops, atorvastatin  DO NOT TAKE ANY DIABETIC MEDICATIONS DAY OF YOUR SURGERY                               You may not have any metal on your body including hair pins and              piercings  Do not wear jewelry, make-up, lotions, powders,perfumes,         deodorant             Do not wear nail polish on your fingernails or toenails .  Do not shave  48 hours prior to surgery.             Do not bring valuables to the hospital. IXL.  Contacts, dentures or bridgework may not be worn into surgery.  You may bring a small overnight bag with you     Patients discharged the day of surgery will not be allowed to drive home. IF YOU ARE HAVING SURGERY AND GOING HOME THE SAME DAY, YOU MUST HAVE AN ADULT TO DRIVE YOU HOME AND BE WITH YOU FOR 24 HOURS. YOU MAY GO HOME BY TAXI OR UBER OR ORTHERWISE, BUT AN ADULT MUST ACCOMPANY YOU  HOME AND STAY WITH YOU FOR 24 HOURS.  Name and phone number of your driver:  Special Instructions: N/A              Please read over the following fact sheets you were given: _____________________________________________________________________             [facility] - Preparing for Surgery Before surgery, you can play an important role.  Because skin is not sterile, your skin needs to be as free of germs as possible.  You can reduce the number of germs on your skin by washing with CHG (chlorahexidine gluconate) soap before surgery.  CHG is an antiseptic cleaner which kills germs and bonds with the skin to continue killing germs even after washing. Please DO NOT use if you have an allergy to CHG or antibacterial soaps.  If your skin becomes reddened/irritated stop using the CHG and inform your nurse when you arrive at Short Stay. Do not shave (including legs and underarms) for at least 48 hours prior to the first CHG shower.  You may shave your face/neck. Please follow these instructions carefully:  1.  Shower with CHG Soap the night before surgery and the  morning of Surgery.  2.  If you choose to wash your hair, wash your hair first as usual with your  normal  shampoo.  3.  After you shampoo, rinse your hair and body thoroughly to remove the  shampoo.                            4.  Use CHG as you would any other liquid soap.  You can apply chg directly  to the skin and wash                       Gently with a scrungie or clean washcloth.  5.  Apply the CHG Soap to your body ONLY FROM THE NECK DOWN.   Do not use on face/ open                           Wound or open sores. Avoid contact with eyes, ears mouth and genitals (private parts).                       Wash face,  Genitals (private parts) with your normal soap.             6.  Wash thoroughly, paying special attention to the area where your surgery  will be performed.  7.  Thoroughly rinse your body with warm water from the neck down.  8.  DO NOT shower/wash with your normal soap after using and rinsing off  the CHG Soap.                9.  Pat yourself dry with a clean towel.            10.  Wear clean pajamas.            11.  Place clean sheets on your bed the night of your first shower and do not  sleep with pets. Day of Surgery : Do not apply any lotions/deodorants the morning of surgery.  Please wear clean clothes to the hospital/surgery center.  FAILURE TO FOLLOW THESE INSTRUCTIONS MAY RESULT IN THE CANCELLATION OF YOUR SURGERY PATIENT SIGNATURE_________________________________  NURSE SIGNATURE__________________________________  ________________________________________________________________________  Incentive Spirometer  An incentive spirometer is a tool that can help keep your lungs clear and active. This tool measures how well you are filling your lungs with each breath. Taking long deep breaths may help reverse or decrease the chance of developing breathing (pulmonary) problems (especially infection) following: A long period of time when you are unable to move or be active. BEFORE THE PROCEDURE  If the spirometer includes an indicator to show your best effort, your nurse or respiratory therapist will set it to a desired goal. If possible, sit up straight or lean slightly forward. Try  not to slouch. Hold the incentive spirometer in an upright position. INSTRUCTIONS FOR USE  Sit on the edge of your bed if possible, or sit up as far as you can in bed or on a chair. Hold the incentive spirometer in an upright position. Breathe out normally. Place the mouthpiece in your mouth and seal your lips tightly around it. Breathe in slowly and as deeply as possible, raising the piston or the ball toward the top of the column. Hold your breath for 3-5 seconds or for as long as possible. Allow the piston or ball to fall to the bottom of the column. Remove the mouthpiece from your mouth and breathe out normally. Rest for a few seconds and repeat Steps 1 through 7 at least 10 times every 1-2 hours when you are awake. Take your time and take a few normal breaths between deep breaths. The spirometer may include an indicator to show your best effort. Use the indicator as a goal to work toward during each repetition. After each set of 10 deep breaths, practice coughing to be sure your lungs are clear. If you have an incision (the cut made at the time of surgery), support your incision when coughing by placing a pillow or rolled up towels firmly against it. Once you are able to get out of bed, walk around indoors and cough well. You may stop using the incentive spirometer when instructed by your caregiver.  RISKS AND COMPLICATIONS Take your time so you do not get dizzy or light-headed. If you are in pain, you may need to take or ask for pain medication before doing incentive spirometry. It is harder to take a deep breath if you are having pain. AFTER USE Rest and breathe slowly and easily. It can be helpful to keep track of a log of your progress. Your caregiver can provide you with a simple table to help with this. If you are using the spirometer at home, follow these instructions: [place] IF:  You are having difficultly using the spirometer. You have trouble using the spirometer as  often as instructed. Your pain medication is not giving enough relief while using the spirometer. You develop fever of 100.5 F (38.1 C) or higher. SEEK IMMEDIATE MEDICAL CARE IF:  You cough up bloody sputum that had not been present before. You develop fever of 102 F (38.9 C) or greater. You develop worsening pain at or near the incision site. MAKE SURE YOU:  Understand these instructions. Will watch your condition. Will get help right away if you are not doing well or get worse. Document Released: 12/29/2006 Document Revised: 11/10/2011 Document Reviewed: 03/01/2007 [facility] Patient Information 2014 [place], [place].   ________________________________________________________________________

## 2021-10-09 ENCOUNTER — Encounter: Payer: Self-pay | Admitting: Registered"

## 2021-10-09 ENCOUNTER — Other Ambulatory Visit (HOSPITAL_COMMUNITY): Payer: Self-pay

## 2021-10-09 ENCOUNTER — Other Ambulatory Visit: Payer: Self-pay

## 2021-10-09 ENCOUNTER — Encounter: Payer: Medicaid Other | Attending: Gastroenterology | Admitting: Registered"

## 2021-10-09 DIAGNOSIS — Z713 Dietary counseling and surveillance: Secondary | ICD-10-CM | POA: Diagnosis present

## 2021-10-09 MED ORDER — PRAZOSIN HCL 1 MG PO CAPS
2.0000 mg | ORAL_CAPSULE | Freq: Every day | ORAL | 3 refills | Status: DC
  Filled 2021-10-09: qty 60, 30d supply, fill #0

## 2021-10-09 MED ORDER — LAMOTRIGINE 100 MG PO TABS
100.0000 mg | ORAL_TABLET | Freq: Every day | ORAL | 3 refills | Status: DC
  Filled 2021-10-23: qty 30, 30d supply, fill #0
  Filled 2021-11-27: qty 30, 30d supply, fill #1
  Filled 2021-12-27: qty 30, 30d supply, fill #2
  Filled 2022-01-22: qty 30, 30d supply, fill #3

## 2021-10-09 NOTE — Patient Instructions (Addendum)
Aim to have 1/2 plate of non-starchy vegetables with lunch and dinner.   Balance breakfast with carbohydrates and protein such as oatmeal + boiled egg.

## 2021-10-09 NOTE — Progress Notes (Signed)
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 States she moved into a new home with a friend and has been tired lately. States she has lost 4 lbs  since previous visit. States she has been grabbing (food) and going; had to grab [place] meat and hawaiian roll on the way out of the door this morning. Reports yesterday she was cleaning her old apartment and turned in keys. States she was busy and didn't eat much. States she has had a headache for the past 3 days.    Clinical Medical Hx: sleep apnea,  Medications: See list Labs: elevated A1c (6.3) Notable Signs/Symptoms: not moving like she should, feels heavy and bloated  Lifestyle & Dietary Hx  Estimated daily fluid intake: 80 oz Supplements: See list Sleep: challenges Stress / self-care: none reported Current average weekly physical activity: none reported  24-Hr Dietary Recall First Meal: skipped Snack:  Second Meal: [employer] - 2 pieces of fish or 8 chicken nuggets  Snack: 3 oranges + handful of pistachios Third Meal: skipped or [employer] - 3 pieces of chicken + fries + biscuit Snack: 3-4 pieces of sugar-free chewing gum Beverages: water (5*16 oz; 80 oz), coffee    NUTRITION DIAGNOSIS  NB-1.1 Food and nutrition-related knowledge deficit As related to prediabetes.  As evidenced by pt verbalizes incomplete knowledge.   NUTRITION INTERVENTION  Nutrition education (E-1) on the following topics: Pt was reminded of goals from previous appointment and discussed correlation of inadequate intake  and headaches. Pt agreed with goals listed.  Handouts Provided Include  none  Learning Style & Readiness for Change Teaching method utilized: Visual & Auditory  Demonstrated degree of understanding via: Teach Back  Barriers to learning/adherence to lifestyle change: financial insecurity  Goals Established by Pt Aim to have 1/2 plate of non-starchy vegetables with lunch and dinner.  Balance breakfast with carbohydrates and protein such as oatmeal + boiled egg.  Continue to drink water with meals and throughout the day. Great job with this!   MONITORING & EVALUATION Dietary intake, weekly physical activity.  Next Steps  Patient is to follow-up in 6 weeks.

## 2021-10-15 ENCOUNTER — Encounter (HOSPITAL_COMMUNITY)
Admission: RE | Admit: 2021-10-15 | Discharge: 2021-10-15 | Disposition: A | Discharge status: Home / Self Care | Payer: Medicaid Other | Source: Ambulatory Visit | Attending: Orthopedic Surgery | Admitting: Orthopedic Surgery

## 2021-10-15 DIAGNOSIS — Z01818 Encounter for other preprocedural examination: Secondary | ICD-10-CM

## 2021-10-15 DIAGNOSIS — Z20822 Contact with and (suspected) exposure to covid-19: Secondary | ICD-10-CM | POA: Diagnosis not present

## 2021-10-15 DIAGNOSIS — Z01812 Encounter for preprocedural laboratory examination: Secondary | ICD-10-CM | POA: Insufficient documentation

## 2021-10-15 LAB — SARS CORONAVIRUS 2 (TAT 6-24 HRS): SARS Coronavirus 2: NEGATIVE

## 2021-10-16 ENCOUNTER — Telehealth: Payer: Self-pay

## 2021-10-16 NOTE — Telephone Encounter (Signed)
I called pt.  She stated that she is checking on the status of her Sleep Study that was ordered in Nov 2022.  She stated that Dr. Rexene Alberts ordered the in sleep lab test.  She had called once time previously and was told that someone would call her and she had not heard back again.  I relayed that I see the orders (in place) the authorization per Insurance needs to be done but I do not do that.  I will have to touch base with sleep lab to see what is going on.  I was able to speak to Meagan and pull up from [place], the authorization (it stated no auth needed).   Meagan to call and check on this as she has not ever seen a PA come back not needing authorization.

## 2021-10-16 NOTE — Telephone Encounter (Signed)
Pt called to check on authorization status but during VM she stated she is experiencing choking in her sleep which is causing her to get very little rest

## 2021-10-16 NOTE — Telephone Encounter (Signed)
Alexis to call pt to schedule for inlab sleep test, per Meagan.

## 2021-10-16 NOTE — Telephone Encounter (Signed)
I called pt and LMVM for her that she should receive call to schedule her in sleep lab hopefully tomorrow.  She is to call back if needed.

## 2021-10-16 NOTE — Anesthesia Preprocedure Evaluation (Addendum)
Anesthesia Evaluation  Patient identified by MRN, date of birth, ID band Patient awake    Reviewed: Allergy & Precautions, NPO status , Patient's Chart, lab work & pertinent test results  History of Anesthesia Complications Negative for: history of anesthetic complications  Airway Mallampati: II  TM Distance: >3 FB Neck ROM: Full    Dental no notable dental hx. (+) Dental Advisory Given, Teeth Intact   Pulmonary neg pulmonary ROS, sleep apnea , former smoker,    Pulmonary exam normal breath sounds clear to auscultation       Cardiovascular negative cardio ROS Normal cardiovascular exam Rhythm:Regular Rate:Normal     Neuro/Psych Anxiety Depression Bipolar Disorder negative neurological ROS  negative psych ROS   GI/Hepatic negative GI ROS, Neg liver ROS, GERD  ,  Endo/Other  negative endocrine ROSdiabetes, Type 2, Oral Hypoglycemic Agents  Renal/GU negative Renal ROS  negative genitourinary   Musculoskeletal negative musculoskeletal ROS (+) Arthritis ,   Abdominal   Peds negative pediatric ROS (+)  Hematology negative hematology ROS (+)   Anesthesia Other Findings   Reproductive/Obstetrics negative OB ROS                            Anesthesia Physical  Anesthesia Plan  ASA: 3  Anesthesia Plan: General   Post-op Pain Management: Regional block*   Induction: Intravenous  PONV Risk Score and Plan: 3 and Ondansetron, Dexamethasone, Midazolam and Treatment may vary due to age or medical condition  Airway Management Planned: Nasal Cannula, Natural Airway, Simple Face Mask and Mask  Additional Equipment: None  Intra-op Plan:   Post-operative Plan: Extubation in OR  Informed Consent: I have reviewed the patients History and Physical, chart, labs and discussed the procedure including the risks, benefits and alternatives for the proposed anesthesia with the patient or authorized  representative who has indicated his/her understanding and acceptance.     Dental advisory given  Plan Discussed with: Anesthesiologist and CRNA  Anesthesia Plan Comments: ( )       Anesthesia Quick Evaluation

## 2021-10-17 ENCOUNTER — Other Ambulatory Visit: Payer: Self-pay

## 2021-10-17 ENCOUNTER — Observation Stay (HOSPITAL_COMMUNITY)
Admission: RE | Admit: 2021-10-17 | Discharge: 2021-10-18 | Disposition: A | Discharge status: Home / Self Care | Payer: Medicaid Other | Source: Ambulatory Visit | Attending: Orthopedic Surgery | Admitting: Orthopedic Surgery

## 2021-10-17 ENCOUNTER — Other Ambulatory Visit (HOSPITAL_COMMUNITY): Payer: Self-pay

## 2021-10-17 ENCOUNTER — Encounter (HOSPITAL_COMMUNITY): Payer: Self-pay | Admitting: Orthopedic Surgery

## 2021-10-17 ENCOUNTER — Encounter (HOSPITAL_COMMUNITY)
Admission: RE | Disposition: A | Discharge status: Home / Self Care | Payer: Self-pay | Source: Ambulatory Visit | Attending: Orthopedic Surgery

## 2021-10-17 ENCOUNTER — Ambulatory Visit (HOSPITAL_BASED_OUTPATIENT_CLINIC_OR_DEPARTMENT_OTHER): Payer: Medicaid Other | Admitting: Anesthesiology

## 2021-10-17 ENCOUNTER — Ambulatory Visit (HOSPITAL_COMMUNITY): Payer: Medicaid Other | Admitting: Anesthesiology

## 2021-10-17 DIAGNOSIS — Z87891 Personal history of nicotine dependence: Secondary | ICD-10-CM | POA: Diagnosis not present

## 2021-10-17 DIAGNOSIS — M25461 Effusion, right knee: Secondary | ICD-10-CM | POA: Diagnosis not present

## 2021-10-17 DIAGNOSIS — M659 Synovitis and tenosynovitis, unspecified: Secondary | ICD-10-CM

## 2021-10-17 DIAGNOSIS — Z91040 Latex allergy status: Secondary | ICD-10-CM | POA: Diagnosis not present

## 2021-10-17 DIAGNOSIS — Z96651 Presence of right artificial knee joint: Secondary | ICD-10-CM

## 2021-10-17 DIAGNOSIS — M25761 Osteophyte, right knee: Secondary | ICD-10-CM

## 2021-10-17 DIAGNOSIS — M1711 Unilateral primary osteoarthritis, right knee: Secondary | ICD-10-CM

## 2021-10-17 DIAGNOSIS — E119 Type 2 diabetes mellitus without complications: Secondary | ICD-10-CM | POA: Insufficient documentation

## 2021-10-17 DIAGNOSIS — M1731 Unilateral post-traumatic osteoarthritis, right knee: Secondary | ICD-10-CM | POA: Diagnosis present

## 2021-10-17 DIAGNOSIS — Z01818 Encounter for other preprocedural examination: Secondary | ICD-10-CM

## 2021-10-17 HISTORY — PX: TOTAL KNEE ARTHROPLASTY: SHX125

## 2021-10-17 HISTORY — DX: Presence of right artificial knee joint: Z96.651

## 2021-10-17 LAB — GLUCOSE, CAPILLARY
Glucose-Capillary: 95 mg/dL (ref 70–99)
Glucose-Capillary: 188 mg/dL — ABNORMAL HIGH (ref 70–99)
Glucose-Capillary: 145 mg/dL — ABNORMAL HIGH (ref 70–99)
Glucose-Capillary: 169 mg/dL — ABNORMAL HIGH (ref 70–99)
Glucose-Capillary: 150 mg/dL — ABNORMAL HIGH (ref 70–99)

## 2021-10-17 SURGERY — ARTHROPLASTY, KNEE, TOTAL
Anesthesia: General | Site: Knee | Laterality: Right

## 2021-10-17 MED ORDER — PROPOFOL 10 MG/ML IV BOLUS
INTRAVENOUS | Status: DC | PRN
  Administered 2021-10-17: 170 mg via INTRAVENOUS

## 2021-10-17 MED ORDER — BUPIVACAINE-EPINEPHRINE 0.25% -1:200000 IJ SOLN
INTRAMUSCULAR | Status: AC
  Filled 2021-10-17: qty 1

## 2021-10-17 MED ORDER — ONDANSETRON HCL 4 MG/2ML IJ SOLN
4.0000 mg | Freq: Four times a day (QID) | INTRAMUSCULAR | Status: DC | PRN
  Administered 2021-10-17: 4 mg via INTRAVENOUS
  Filled 2021-10-17: qty 2

## 2021-10-17 MED ORDER — LIDOCAINE HCL (PF) 2 % IJ SOLN
INTRAMUSCULAR | Status: DC | PRN
  Administered 2021-10-17: 60 mg via INTRADERMAL

## 2021-10-17 MED ORDER — ACETAMINOPHEN 10 MG/ML IV SOLN
INTRAVENOUS | Status: AC
  Filled 2021-10-17: qty 100

## 2021-10-17 MED ORDER — MIDAZOLAM HCL 2 MG/2ML IJ SOLN
INTRAMUSCULAR | Status: AC
Start: 2021-10-17 — End: ?
  Filled 2021-10-17: qty 2

## 2021-10-17 MED ORDER — ACETAMINOPHEN 160 MG/5ML PO SOLN: 325.0000 mg | ORAL | Status: DC | PRN

## 2021-10-17 MED ORDER — MIDAZOLAM HCL 2 MG/2ML IJ SOLN
INTRAMUSCULAR | Status: DC | PRN
Start: 2021-10-17 — End: 2021-10-17
  Administered 2021-10-17: 2 mg via INTRAVENOUS

## 2021-10-17 MED ORDER — METOCLOPRAMIDE HCL 5 MG PO TABS: 5.0000 mg | ORAL_TABLET | Freq: Three times a day (TID) | ORAL | Status: DC | PRN

## 2021-10-17 MED ORDER — METFORMIN HCL ER 500 MG PO TB24
500.0000 mg | ORAL_TABLET | Freq: Every day | ORAL | Status: DC
  Administered 2021-10-18: 500 mg via ORAL
  Filled 2021-10-17: qty 1

## 2021-10-17 MED ORDER — FENTANYL CITRATE (PF) 250 MCG/5ML IJ SOLN
INTRAMUSCULAR | Status: DC | PRN
  Administered 2021-10-17 (×3): 50 ug via INTRAVENOUS
  Administered 2021-10-17 (×2): 25 ug via INTRAVENOUS
  Administered 2021-10-17 (×2): 50 ug via INTRAVENOUS
  Administered 2021-10-17 (×2): 25 ug via INTRAVENOUS

## 2021-10-17 MED ORDER — KETOROLAC TROMETHAMINE 30 MG/ML IJ SOLN
INTRAMUSCULAR | Status: DC | PRN
  Administered 2021-10-17: 30 mg

## 2021-10-17 MED ORDER — MENTHOL 3 MG MT LOZG: 1.0000 | LOZENGE | OROMUCOSAL | Status: DC | PRN

## 2021-10-17 MED ORDER — LAMOTRIGINE 100 MG PO TABS
100.0000 mg | ORAL_TABLET | Freq: Every day | ORAL | Status: DC
  Administered 2021-10-18: 100 mg via ORAL
  Filled 2021-10-17 (×2): qty 1

## 2021-10-17 MED ORDER — DEXAMETHASONE SODIUM PHOSPHATE 10 MG/ML IJ SOLN
INTRAMUSCULAR | Status: AC
  Filled 2021-10-17: qty 1

## 2021-10-17 MED ORDER — SODIUM CHLORIDE (PF) 0.9 % IJ SOLN
INTRAMUSCULAR | Status: AC
  Filled 2021-10-17: qty 30

## 2021-10-17 MED ORDER — DEXMEDETOMIDINE (PRECEDEX) IN NS 20 MCG/5ML (4 MCG/ML) IV SYRINGE
PREFILLED_SYRINGE | INTRAVENOUS | Status: DC | PRN
  Administered 2021-10-17: 12 ug via INTRAVENOUS

## 2021-10-17 MED ORDER — PHENOL 1.4 % MT LIQD: 1.0000 | OROMUCOSAL | Status: DC | PRN

## 2021-10-17 MED ORDER — BUPIVACAINE-EPINEPHRINE (PF) 0.25% -1:200000 IJ SOLN
INTRAMUSCULAR | Status: DC | PRN
Start: 2021-10-17 — End: 2021-10-17
  Administered 2021-10-17: 30 mL

## 2021-10-17 MED ORDER — FENTANYL CITRATE (PF) 100 MCG/2ML IJ SOLN
INTRAMUSCULAR | Status: DC | PRN
Start: 2021-10-17 — End: 2021-10-17
  Administered 2021-10-17 (×2): 50 ug via INTRAVENOUS

## 2021-10-17 MED ORDER — KETOROLAC TROMETHAMINE 30 MG/ML IJ SOLN
INTRAMUSCULAR | Status: AC
  Filled 2021-10-17: qty 1

## 2021-10-17 MED ORDER — CHLORHEXIDINE GLUCONATE 0.12 % MT SOLN
15.0000 mL | Freq: Once | OROMUCOSAL | Status: AC
  Administered 2021-10-17: 15 mL via OROMUCOSAL

## 2021-10-17 MED ORDER — ACETAMINOPHEN 325 MG PO TABS: 325.0000 mg | ORAL_TABLET | ORAL | Status: DC | PRN

## 2021-10-17 MED ORDER — FENTANYL CITRATE (PF) 100 MCG/2ML IJ SOLN
INTRAMUSCULAR | Status: AC
  Filled 2021-10-17: qty 2

## 2021-10-17 MED ORDER — SODIUM CHLORIDE (PF) 0.9 % IJ SOLN
INTRAMUSCULAR | Status: DC | PRN
Start: 2021-10-17 — End: 2021-10-17
  Administered 2021-10-17: 30 mL

## 2021-10-17 MED ORDER — PROPOFOL 500 MG/50ML IV EMUL
INTRAVENOUS | Status: DC | PRN
  Administered 2021-10-17: 25 ug/kg/min via INTRAVENOUS

## 2021-10-17 MED ORDER — HYDROMORPHONE HCL 1 MG/ML IJ SOLN
0.5000 mg | INTRAMUSCULAR | Status: DC | PRN
  Administered 2021-10-17 – 2021-10-18 (×2): 1 mg via INTRAVENOUS
  Filled 2021-10-17 (×2): qty 1

## 2021-10-17 MED ORDER — METOCLOPRAMIDE HCL 5 MG/ML IJ SOLN: 5.0000 mg | Freq: Three times a day (TID) | INTRAMUSCULAR | Status: DC | PRN

## 2021-10-17 MED ORDER — ONDANSETRON HCL 4 MG PO TABS: 4.0000 mg | ORAL_TABLET | Freq: Four times a day (QID) | ORAL | Status: DC | PRN

## 2021-10-17 MED ORDER — KETOROLAC TROMETHAMINE 15 MG/ML IJ SOLN
7.5000 mg | Freq: Four times a day (QID) | INTRAMUSCULAR | Status: AC
  Administered 2021-10-17 (×2): 7.5 mg via INTRAVENOUS
  Filled 2021-10-17 (×2): qty 1

## 2021-10-17 MED ORDER — SODIUM CHLORIDE 0.9 % IR SOLN
Status: DC | PRN
  Administered 2021-10-17: 1000 mL

## 2021-10-17 MED ORDER — OXYCODONE HCL 5 MG/5ML PO SOLN: 5.0000 mg | Freq: Once | ORAL | Status: AC | PRN

## 2021-10-17 MED ORDER — ATORVASTATIN CALCIUM 40 MG PO TABS
80.0000 mg | ORAL_TABLET | Freq: Every day | ORAL | Status: DC
  Administered 2021-10-18: 80 mg via ORAL
  Filled 2021-10-17: qty 2

## 2021-10-17 MED ORDER — FENTANYL CITRATE (PF) 250 MCG/5ML IJ SOLN
INTRAMUSCULAR | Status: AC
  Filled 2021-10-17: qty 5

## 2021-10-17 MED ORDER — LACTATED RINGERS IV SOLN: INTRAVENOUS | Status: DC

## 2021-10-17 MED ORDER — FENTANYL CITRATE PF 50 MCG/ML IJ SOSY
PREFILLED_SYRINGE | INTRAMUSCULAR | Status: AC
  Administered 2021-10-17: 50 ug via INTRAVENOUS
  Filled 2021-10-17: qty 1

## 2021-10-17 MED ORDER — METHOCARBAMOL 500 MG IVPB - SIMPLE MED
INTRAVENOUS | Status: AC
  Administered 2021-10-17: 500 mg via INTRAVENOUS
  Filled 2021-10-17: qty 50

## 2021-10-17 MED ORDER — POVIDONE-IODINE 10 % EX SWAB
2.0000 "application " | Freq: Once | CUTANEOUS | Status: AC
  Administered 2021-10-17: 2 via TOPICAL

## 2021-10-17 MED ORDER — TRANEXAMIC ACID-NACL 1000-0.7 MG/100ML-% IV SOLN
1000.0000 mg | Freq: Once | INTRAVENOUS | Status: AC
  Administered 2021-10-17: 1000 mg via INTRAVENOUS
  Filled 2021-10-17: qty 100

## 2021-10-17 MED ORDER — FENTANYL CITRATE PF 50 MCG/ML IJ SOSY
25.0000 ug | PREFILLED_SYRINGE | INTRAMUSCULAR | Status: DC | PRN
  Administered 2021-10-17: 50 ug via INTRAVENOUS

## 2021-10-17 MED ORDER — POLYETHYLENE GLYCOL 3350 17 G PO PACK: 17.0000 g | PACK | Freq: Every day | ORAL | Status: DC | PRN

## 2021-10-17 MED ORDER — ONDANSETRON HCL 4 MG/2ML IJ SOLN: 4.0000 mg | Freq: Once | INTRAMUSCULAR | Status: DC | PRN

## 2021-10-17 MED ORDER — TOBRAMYCIN SULFATE 1.2 G IJ SOLR
INTRAMUSCULAR | Status: AC
  Filled 2021-10-17: qty 1.2

## 2021-10-17 MED ORDER — BISACODYL 10 MG RE SUPP: 10.0000 mg | Freq: Every day | RECTAL | Status: DC | PRN

## 2021-10-17 MED ORDER — LINACLOTIDE 145 MCG PO CAPS
145.0000 ug | ORAL_CAPSULE | Freq: Every day | ORAL | Status: DC
  Administered 2021-10-18: 145 ug via ORAL
  Filled 2021-10-17: qty 1

## 2021-10-17 MED ORDER — CEFAZOLIN SODIUM-DEXTROSE 2-4 GM/100ML-% IV SOLN
2.0000 g | INTRAVENOUS | Status: AC
  Administered 2021-10-17: 2 g via INTRAVENOUS
  Filled 2021-10-17: qty 100

## 2021-10-17 MED ORDER — ORAL CARE MOUTH RINSE: 15.0000 mL | Freq: Once | OROMUCOSAL | Status: AC

## 2021-10-17 MED ORDER — ROPIVACAINE HCL 7.5 MG/ML IJ SOLN
INTRAMUSCULAR | Status: DC | PRN
  Administered 2021-10-17: 30 mL via PERINEURAL

## 2021-10-17 MED ORDER — OXYCODONE HCL 5 MG PO TABS
ORAL_TABLET | ORAL | Status: AC
  Administered 2021-10-17: 5 mg via ORAL
  Filled 2021-10-17: qty 1

## 2021-10-17 MED ORDER — TRANEXAMIC ACID-NACL 1000-0.7 MG/100ML-% IV SOLN
1000.0000 mg | INTRAVENOUS | Status: AC
  Administered 2021-10-17: 1000 mg via INTRAVENOUS
  Filled 2021-10-17: qty 100

## 2021-10-17 MED ORDER — ONDANSETRON HCL 4 MG/2ML IJ SOLN
INTRAMUSCULAR | Status: DC | PRN
  Administered 2021-10-17: 4 mg via INTRAVENOUS

## 2021-10-17 MED ORDER — DOCUSATE SODIUM 100 MG PO CAPS
100.0000 mg | ORAL_CAPSULE | Freq: Two times a day (BID) | ORAL | Status: DC
  Administered 2021-10-17 – 2021-10-18 (×3): 100 mg via ORAL
  Filled 2021-10-17 (×3): qty 1

## 2021-10-17 MED ORDER — DIPHENHYDRAMINE HCL 12.5 MG/5ML PO ELIX: 12.5000 mg | ORAL_SOLUTION | ORAL | Status: DC | PRN

## 2021-10-17 MED ORDER — CEFAZOLIN SODIUM-DEXTROSE 2-4 GM/100ML-% IV SOLN
2.0000 g | Freq: Four times a day (QID) | INTRAVENOUS | Status: AC
  Administered 2021-10-17 (×2): 2 g via INTRAVENOUS
  Filled 2021-10-17 (×2): qty 100

## 2021-10-17 MED ORDER — VANCOMYCIN HCL 1000 MG IV SOLR
INTRAVENOUS | Status: AC
  Filled 2021-10-17: qty 20

## 2021-10-17 MED ORDER — INSULIN ASPART 100 UNIT/ML IJ SOLN
0.0000 [IU] | Freq: Three times a day (TID) | INTRAMUSCULAR | Status: DC
  Administered 2021-10-17: 3 [IU] via SUBCUTANEOUS
  Administered 2021-10-18 (×2): 2 [IU] via SUBCUTANEOUS

## 2021-10-17 MED ORDER — PHENYLEPHRINE HCL (PRESSORS) 10 MG/ML IV SOLN
INTRAVENOUS | Status: AC
  Filled 2021-10-17: qty 1

## 2021-10-17 MED ORDER — SODIUM CHLORIDE 0.9 % IV SOLN: INTRAVENOUS | Status: DC

## 2021-10-17 MED ORDER — METHOCARBAMOL 500 MG PO TABS
500.0000 mg | ORAL_TABLET | Freq: Four times a day (QID) | ORAL | Status: DC | PRN
  Administered 2021-10-17 – 2021-10-18 (×3): 500 mg via ORAL
  Filled 2021-10-17 (×3): qty 1

## 2021-10-17 MED ORDER — ACETAMINOPHEN 10 MG/ML IV SOLN
INTRAVENOUS | Status: DC | PRN
  Administered 2021-10-17: 1000 mg via INTRAVENOUS

## 2021-10-17 MED ORDER — PHENYLEPHRINE HCL (PRESSORS) 10 MG/ML IV SOLN
INTRAVENOUS | Status: AC
Start: 2021-10-17 — End: ?
  Filled 2021-10-17: qty 1

## 2021-10-17 MED ORDER — DEXAMETHASONE SODIUM PHOSPHATE 10 MG/ML IJ SOLN
INTRAMUSCULAR | Status: DC | PRN
  Administered 2021-10-17: 10 mg

## 2021-10-17 MED ORDER — OXYCODONE HCL 5 MG PO TABS
10.0000 mg | ORAL_TABLET | ORAL | Status: DC | PRN
  Administered 2021-10-17: 15 mg via ORAL
  Administered 2021-10-17: 10 mg via ORAL
  Administered 2021-10-18 (×2): 15 mg via ORAL
  Filled 2021-10-17 (×4): qty 3

## 2021-10-17 MED ORDER — METHOCARBAMOL 500 MG IVPB - SIMPLE MED
500.0000 mg | Freq: Four times a day (QID) | INTRAVENOUS | Status: DC | PRN
  Filled 2021-10-17: qty 50

## 2021-10-17 MED ORDER — FERROUS SULFATE 325 (65 FE) MG PO TABS: 325.0000 mg | ORAL_TABLET | Freq: Three times a day (TID) | ORAL | Status: DC

## 2021-10-17 MED ORDER — OXYCODONE HCL 5 MG PO TABS: 5.0000 mg | ORAL_TABLET | Freq: Once | ORAL | Status: AC | PRN

## 2021-10-17 MED ORDER — ACETAMINOPHEN 325 MG PO TABS
325.0000 mg | ORAL_TABLET | Freq: Four times a day (QID) | ORAL | Status: DC | PRN
  Administered 2021-10-18: 650 mg via ORAL
  Filled 2021-10-17: qty 2

## 2021-10-17 MED ORDER — FENTANYL CITRATE PF 50 MCG/ML IJ SOSY
PREFILLED_SYRINGE | INTRAMUSCULAR | Status: AC
  Administered 2021-10-17: 50 ug via INTRAVENOUS
  Filled 2021-10-17: qty 2

## 2021-10-17 MED ORDER — OXYCODONE HCL 5 MG PO TABS
5.0000 mg | ORAL_TABLET | ORAL | Status: DC | PRN
  Administered 2021-10-17: 5 mg via ORAL
  Administered 2021-10-18: 10 mg via ORAL
  Filled 2021-10-17: qty 2
  Filled 2021-10-17: qty 1

## 2021-10-17 MED ORDER — ONDANSETRON HCL 4 MG/2ML IJ SOLN
INTRAMUSCULAR | Status: AC
  Filled 2021-10-17: qty 2

## 2021-10-17 MED ORDER — MEPERIDINE HCL 50 MG/ML IJ SOLN: 6.2500 mg | INTRAMUSCULAR | Status: DC | PRN

## 2021-10-17 MED ORDER — ASPIRIN 81 MG PO CHEW
81.0000 mg | CHEWABLE_TABLET | Freq: Two times a day (BID) | ORAL | Status: DC
  Administered 2021-10-17 – 2021-10-18 (×2): 81 mg via ORAL
  Filled 2021-10-17 (×2): qty 1

## 2021-10-17 SURGICAL SUPPLY — 51 items
ATTUNE MED ANAT PAT 32 KNEE (Knees) ×1 IMPLANT
ATTUNE PS FEM RT SZ 4 CEM KNEE (Femur) ×1 IMPLANT
ATTUNE PSRP INSR SZ4 6 KNEE (Insert) ×1 IMPLANT
BAG COUNTER SPONGE SURGICOUNT (BAG) ×2 IMPLANT
BAG ZIPLOCK 12X15 (MISCELLANEOUS) ×1 IMPLANT
BASE TIBIAL ROT PLAT SZ 5 KNEE (Knees) IMPLANT
BLADE SAW SGTL 11.0X1.19X90.0M (BLADE) ×1 IMPLANT
BLADE SAW SGTL 13.0X1.19X90.0M (BLADE) ×2 IMPLANT
BLADE SURG SZ10 CARB STEEL (BLADE) ×2 IMPLANT
BNDG ELASTIC 6X5.8 VLCR STR LF (GAUZE/BANDAGES/DRESSINGS) ×2 IMPLANT
BOWL SMART MIX CTS (DISPOSABLE) ×2 IMPLANT
CEMENT HV SMART SET (Cement) ×2 IMPLANT
CUFF TOURN SGL QUICK 34 (TOURNIQUET CUFF) ×1
CUFF TRNQT CYL 34X4.125X (TOURNIQUET CUFF) ×1 IMPLANT
DERMABOND ADVANCED (GAUZE/BANDAGES/DRESSINGS) ×1
DERMABOND ADVANCED .7 DNX12 (GAUZE/BANDAGES/DRESSINGS) ×1 IMPLANT
DRAPE INCISE IOBAN 66X45 STRL (DRAPES) ×1 IMPLANT
DRAPE U-SHAPE 47X51 STRL (DRAPES) ×2 IMPLANT
DRESSING AQUACEL AG SP 3.5X10 (GAUZE/BANDAGES/DRESSINGS) ×1 IMPLANT
DRSG AQUACEL AG ADV 3.5X10 (GAUZE/BANDAGES/DRESSINGS) ×1 IMPLANT
DRSG AQUACEL AG SP 3.5X10 (GAUZE/BANDAGES/DRESSINGS) ×2
DURAPREP 26ML APPLICATOR (WOUND CARE) ×4 IMPLANT
ELECT REM PT RETURN 15FT ADLT (MISCELLANEOUS) ×2 IMPLANT
GLOVE SURG ENC MOIS LTX SZ6 (GLOVE) ×2 IMPLANT
GLOVE SURG ENC MOIS LTX SZ7 (GLOVE) ×2 IMPLANT
GLOVE SURG UNDER POLY LF SZ7.5 (GLOVE) ×2 IMPLANT
GOWN STRL REUS W/TWL LRG LVL3 (GOWN DISPOSABLE) ×2 IMPLANT
HANDPIECE INTERPULSE COAX TIP (DISPOSABLE) ×1
HOLDER FOLEY CATH W/STRAP (MISCELLANEOUS) IMPLANT
KIT TURNOVER KIT A (KITS) IMPLANT
MANIFOLD NEPTUNE II (INSTRUMENTS) ×2 IMPLANT
NDL SAFETY ECLIPSE 18X1.5 (NEEDLE) IMPLANT
NEEDLE HYPO 18GX1.5 SHARP (NEEDLE) ×1
NS IRRIG 1000ML POUR BTL (IV SOLUTION) ×2 IMPLANT
PACK TOTAL KNEE CUSTOM (KITS) ×2 IMPLANT
PROTECTOR NERVE ULNAR (MISCELLANEOUS) ×2 IMPLANT
SET HNDPC FAN SPRY TIP SCT (DISPOSABLE) ×1 IMPLANT
SET PAD KNEE POSITIONER (MISCELLANEOUS) ×2 IMPLANT
SPIKE FLUID TRANSFER (MISCELLANEOUS) ×4 IMPLANT
SPONGE T-LAP 18X18 ~~LOC~~+RFID (SPONGE) ×8 IMPLANT
SUT MNCRL AB 4-0 PS2 18 (SUTURE) ×2 IMPLANT
SUT STRATAFIX PDS+ 0 24IN (SUTURE) ×2 IMPLANT
SUT VIC AB 1 CT1 36 (SUTURE) ×2 IMPLANT
SUT VIC AB 2-0 CT1 27 (SUTURE) ×3
SUT VIC AB 2-0 CT1 TAPERPNT 27 (SUTURE) ×3 IMPLANT
SYR 3ML LL SCALE MARK (SYRINGE) ×2 IMPLANT
TIBIAL BASE ROT PLAT SZ 5 KNEE (Knees) ×2 IMPLANT
TRAY FOLEY MTR SLVR 16FR STAT (SET/KITS/TRAYS/PACK) ×2 IMPLANT
TUBE SUCTION HIGH CAP CLEAR NV (SUCTIONS) ×2 IMPLANT
WATER STERILE IRR 1000ML POUR (IV SOLUTION) ×4 IMPLANT
WRAP KNEE MAXI GEL POST OP (GAUZE/BANDAGES/DRESSINGS) ×2 IMPLANT

## 2021-10-17 NOTE — Anesthesia Postprocedure Evaluation (Signed)
Anesthesia Post Note  Patient: Meghan Wilson  Procedure(s) Performed: TOTAL KNEE ARTHROPLASTY (Right: Knee)     Patient location during evaluation: PACU Anesthesia Type: General Level of consciousness: awake and alert Pain management: pain level controlled Vital Signs Assessment: post-procedure vital signs reviewed and stable Respiratory status: spontaneous breathing, nonlabored ventilation, respiratory function stable and patient connected to nasal cannula oxygen Cardiovascular status: blood pressure returned to baseline and stable Postop Assessment: no apparent nausea or vomiting Anesthetic complications: no   No notable events documented.  Last Vitals:  Vitals:   10/17/21 0930 10/17/21 0945  BP: (!) 141/80 (!) 133/98  Pulse: (!) 106 (!) 103  Resp: 15 17  Temp:    SpO2: 98% 96%    Last Pain:  Vitals:   10/17/21 0945  TempSrc:   PainSc: 10-Worst pain ever                 [name]

## 2021-10-17 NOTE — Anesthesia Procedure Notes (Signed)
Anesthesia Regional Block: Adductor canal block   Pre-Anesthetic Checklist: , timeout performed,  Correct Patient, Correct Site, Correct Laterality,  Correct Procedure, Correct Position, site marked,  Risks and benefits discussed,  Surgical consent,  Pre-op evaluation,  At surgeon's request and post-op pain management  Laterality: Right  Prep: chloraprep       Needles:  Injection technique: Single-shot  Needle Type: Echogenic Stimulator Needle     Needle Length: 5cm  Needle Gauge: 22     Additional Needles:   Procedures:, nerve stimulator,,, ultrasound used (permanent image in chart),,    Narrative:  Start time: 10/17/2021 6:56 AM End time: 10/17/2021 7:03 AM Injection made incrementally with aspirations every 5 mL.  Performed by: Personally  Anesthesiologist: Janeece Riggers, MD  Additional Notes: Functioning IV was confirmed and monitors were applied.  A 34mm 22ga Arrow echogenic stimulator needle was used. Sterile prep and drape,hand hygiene and sterile gloves were used. Ultrasound guidance: relevant anatomy identified, needle position confirmed, local anesthetic spread visualized around nerve(s)., vascular puncture avoided.  Image printed for medical record. Negative aspiration and negative test dose prior to incremental administration of local anesthetic. The patient tolerated the procedure well.

## 2021-10-17 NOTE — H&P (Signed)
TOTAL KNEE ADMISSION H&P  Patient is being admitted for right total knee arthroplasty.  Subjective:  Chief Complaint:right knee pain.  HPI: Meghan Wilson, 61 y.o. female, has a history of pain and functional disability in the right knee due to arthritis and posttraumatic arthritis  and has failed non-surgical conservative treatments for greater than 12 weeks to includeNSAID's and/or analgesics, corticosteriod injections, and activity modification.  Onset of symptoms was gradual, starting 2 years ago with gradually worsening course since that time. The patient noted prior procedures on the knee to include  tibial plateau fracture with ORIF and subsequent hardware removal  on the right knee(s).  Patient currently rates pain in the right knee(s) at 8 out of 10 with activity. Patient has worsening of pain with activity and weight bearing, pain that interferes with activities of daily living, and pain with passive range of motion.  Patient has evidence of joint space narrowing by imaging studies. There is no active infection.  Patient Active Problem List   Diagnosis Date Noted   S/P hardware removal, right tibia 09/17/2021   Preoperative evaluation to rule out surgical contraindication 08/24/2021   Aortic atherosclerosis ([place]) 07/08/2021   Depression 06/27/2021   Obesity (BMI 35.0-39.9 without comorbidity) 06/27/2021   Generalized abdominal discomfort 05/06/2021   Fatigue 05/06/2021   UTI (urinary tract infection) 05/02/2021   Chest pain, musculoskeletal 05/02/2021   Vaginal atrophy 11/09/2020   Type II diabetes mellitus ([place]) 02/16/2019   Healthcare maintenance 01/02/2018   Hyperlipidemia 01/01/2018   Peptic gastritis 01/01/2018   Bipolar disorder ([place]) 08/03/2015   Past Medical History:  Diagnosis Date   Anxiety    Arthritis    Bipolar depression ([place]) 09/2015   Depression 06/27/2021   Dizziness    GERD (gastroesophageal reflux disease)    History of colon polyps    History of kidney  stones    History of patellar fracture 2016   History of tibial fracture 2016   Hyperlipidemia    Night terrors    Night terrors, adult 09/2015   Pain of right knee after injury 03/10/2018   Post concussion syndrome 12/20/2019   Pre-diabetes    Seasonal allergies 11/09/2020   Sleep apnea    doesnt wear c-pap    Past Surgical History:  Procedure Laterality Date   ABDOMINAL HYSTERECTOMY     CHOLECYSTECTOMY     CLOSED REDUCTION TIBIAL FRACTURE Right 2003   COLONOSCOPY  2016   [place] Right 09/17/2021   Procedure: Removal of hardware from right tibia;  Surgeon: Paralee Cancel, MD;  Location: [facility];  Service: Orthopedics;  Laterality: Right;   MANDIBLE SURGERY  2022   WISDOM TOOTH EXTRACTION      Current Facility-Administered Medications  Medication Dose Route Frequency Provider Last Rate Last Admin   ceFAZolin (ANCEF) IVPB 2g/100 mL premix  2 g Intravenous On Call to OR Irving Copas, PA-C       lactated ringers infusion   Intravenous Continuous Irving Copas, PA-C       lactated ringers infusion   Intravenous Continuous Brennan Bailey, MD 10 mL/hr at 10/17/21 0556 Restarted at 10/17/21 5093   tranexamic acid (CYKLOKAPRON) IVPB 1,000 mg  1,000 mg Intravenous To OR Irving Copas, PA-C       Allergies  Allergen Reactions   Depakote [Divalproex Sodium] Other (See Comments)    Hair fell out   Ibuprofen Nausea And Vomiting   Prednisone Other (See Comments)  Constipation, nervousness All steroids   Latex Rash    Social History   Tobacco Use   Smoking status: Former    Packs/day: 0.25    Years: 4.00    Pack years: 1.00    Types: Cigarettes    Quit date: 12/21/2017    Years since quitting: 3.8   Smokeless tobacco: Never  Substance Use Topics   Alcohol use: Yes    Comment: special occasions    Family History  Problem Relation Age of Onset   Hyperlipidemia Mother    Hypertension Mother    Colon polyps Mother    Heart disease  Mother    Arthritis/Rheumatoid Father    Hyperlipidemia Maternal Aunt    Hypertension Maternal Aunt    Cancer Maternal Aunt    Colon polyps Maternal Grandmother    Stroke Other    Colon cancer Neg Hx    Esophageal cancer Neg Hx    Stomach cancer Neg Hx    Rectal cancer Neg Hx    Sleep apnea Neg Hx      Review of Systems  Constitutional:  Negative for chills and fever.  Respiratory:  Negative for cough and shortness of breath.   Cardiovascular:  Negative for chest pain.  Gastrointestinal:  Negative for nausea and vomiting.  Musculoskeletal:  Positive for arthralgias.    Objective:  Physical Exam Well nourished and well developed. General: Alert and oriented x3, cooperative and pleasant, no acute distress. Head: normocephalic, atraumatic, neck supple. Eyes: EOMI.  Musculoskeletal: Right Knee: Well healed anterior knee incision with dermabond present.  Calves soft and nontender. Motor function intact in LE. Strength 5/5 LE bilaterally. Neuro: Distal pulses 2+. Sensation to light touch intact in LE.   Vital signs in last 24 hours: Temp:  [98.1 F (36.7 C)] 98.1 F (36.7 C) (02/16 0537) Pulse Rate:  [85] 85 (02/16 0537) Resp:  [18] 18 (02/16 0537) BP: (138)/(83) 138/83 (02/16 0537) SpO2:  [99 %] 99 % (02/16 0537) Weight:  [92.1 kg] 92.1 kg (02/16 0537)  Labs:   Estimated body mass index is 35.96 kg/m as calculated from the following:   Height as of this encounter: 5\' 3"  (1.6 m).   Weight as of this encounter: 92.1 kg.   Imaging Review Plain radiographs demonstrate severe degenerative joint disease of the right knee(s). The overall alignment isneutral. The bone quality appears to be adequate for age and reported activity level.      Assessment/Plan:  End stage arthritis, right knee   The patient history, physical examination, clinical judgment of the provider and imaging studies are consistent with end stage degenerative joint disease of the right knee(s)  and total knee arthroplasty is deemed medically necessary. The treatment options including medical management, injection therapy arthroscopy and arthroplasty were discussed at length. The risks and benefits of total knee arthroplasty were presented and reviewed. The risks due to aseptic loosening, infection, stiffness, patella tracking problems, thromboembolic complications and other imponderables were discussed. The patient acknowledged the explanation, agreed to proceed with the plan and consent was signed. Patient is being admitted for inpatient treatment for surgery, pain control, PT, OT, prophylactic antibiotics, VTE prophylaxis, progressive ambulation and ADL's and discharge planning. The patient is planning to be discharged  home.  Therapy Plans: outpatient therapy at [facility] Disposition: Home with Sonia Side (fiance) Planned DVT Prophylaxis: aspirin 81mg  BID DME needed: none PCP: Dr. Humphrey Rolls, clearance received TXA: IV Allergies: Ibuprofen - stomach irritation, latex - rash, prednisone - N/V, depakote - hair  loss, rash Anesthesia Concerns: none BMI: 36.5 Last HgbA1c: 6.2%  Other: - Oxycodone, tylenol - No NSAIDs - difficulty with stomach pain/reflux - Loves hiking, fishing, swimming    Patient's anticipated LOS is less than 2 midnights, meeting these requirements: - Younger than 45 - Lives within 1 hour of care - Has a competent adult at home to recover with post-op recover - NO history of  - Chronic pain requiring opiods  - Diabetes  - Coronary Artery Disease  - Heart failure  - Heart attack  - Stroke  - DVT/VTE  - Cardiac arrhythmia  - Respiratory Failure/COPD  - Renal failure  - Anemia  - Advanced Liver disease  Costella Hatcher, PA-C Orthopedic Surgery [facility] [place] [phone]

## 2021-10-17 NOTE — Anesthesia Procedure Notes (Signed)
Procedure Name: LMA Insertion Date/Time: 10/17/2021 7:29 AM Performed by: Cleda Daub, CRNA Pre-anesthesia Checklist: Patient identified, Emergency Drugs available, Suction available and Patient being monitored Patient Re-evaluated:Patient Re-evaluated prior to induction Oxygen Delivery Method: Circle system utilized Preoxygenation: Pre-oxygenation with 100% oxygen Induction Type: IV induction Ventilation: Mask ventilation without difficulty LMA: LMA inserted LMA Size: 4.0 Number of attempts: 1 Airway Equipment and Method: Oral airway Placement Confirmation: positive ETCO2 and breath sounds checked- equal and bilateral Tube secured with: Tape Dental Injury: Teeth and Oropharynx as per pre-operative assessment

## 2021-10-17 NOTE — Transfer of Care (Signed)
Immediate Anesthesia Transfer of Care Note  Patient: Meghan Wilson  Procedure(s) Performed: TOTAL KNEE ARTHROPLASTY (Right: Knee)  Patient Location: PACU  Anesthesia Type:GA combined with regional for post-op pain  Level of Consciousness: awake, alert , oriented and patient cooperative  Airway & Oxygen Therapy: Patient Spontanous Breathing and Patient connected to face mask oxygen  Post-op Assessment: Report given to RN and Post -op Vital signs reviewed and stable  Post vital signs: Reviewed and stable  Last Vitals:  Vitals Value Taken Time  BP 144/79 10/17/21 0923  Temp    Pulse 105 10/17/21 0925  Resp 14 10/17/21 0925  SpO2 97 % 10/17/21 0925  Vitals shown include unvalidated device data.  Last Pain:  Vitals:   10/17/21 0541  TempSrc:   PainSc: 6       Patients Stated Pain Goal: 5 (33/82/50 5397)  Complications: No notable events documented.

## 2021-10-17 NOTE — Op Note (Signed)
NAME:  Meghan Wilson                      MEDICAL RECORD NO.:  [id number]                             FACILITY:  [facility]      PHYSICIAN:  Pietro Cassis. Alvan Dame, M.D.  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  The patient had failed months of conservative treatment including medications, injection therapy, activity modification.     PROCEDURE:  Right total knee replacement.      COMPONENTS USED:  DePuy Attune rotating platform posterior stabilized knee   system, a size 4 femur, 5 tibia, size 6 mm PS AOX insert, and 32 anatomic patellar   button.      SURGEON:  Pietro Cassis. Alvan Dame, M.D.      ASSISTANT:  Costella Hatcher, PA-C.      ANESTHESIA:  General and Regional.      SPECIMENS:  None.      COMPLICATION:  None.      DRAINS:  None.  EBL: 400cc      TOURNIQUET TIME:   Total Tourniquet Time Documented: Thigh (Right) - 2 minutes Thigh (Right) - 23 minutes Total: Thigh (Right) - 25 minutes  .      The patient was stable to the recovery room.      INDICATION FOR PROCEDURE:  Meghan Wilson is a 61 y.o. female patient of   mine.  The patient had been seen, evaluated, and treated for months conservatively in the   office with medication, activity modification, and injections.  The patient had   radiographic changes of bone-on-bone arthritis with endplate sclerosis and osteophytes noted.  Based on the radiographic changes and failed conservative measures, the patient   decided to proceed with  definitive treatment, total knee replacement.  Risks of infection, DVT, component failure, need for revision surgery, neurovascular injury were reviewed in the office setting.  The postop course was reviewed stressing the efforts to maximize post-operative satisfaction and function.  Consent was obtained for benefit of pain   relief.      PROCEDURE IN DETAIL:  The patient was brought to the operative theater.   Once adequate anesthesia, preoperative antibiotics, 2 gm of Ancef,1 gm of Tranexamic Acid, and 10 mg of Decadron administered, the patient was positioned supine with a right thigh tourniquet placed.  The  right lower extremity was prepped and draped in sterile fashion.  A time-   out was performed identifying the patient, planned procedure, and the appropriate extremity.      The right lower extremity was placed in the [facility] [facility] leg holder.  The leg was   exsanguinated, tourniquet  elevated to 250 mmHg.  A midline incision was   made followed by median parapatellar arthrotomy.  Following initial   exposure, attention was first directed to the patella.  Precut   measurement was noted to be 22 mm.  I resected down to 14 mm and used a   32 anatomic patellar button to restore patellar height as well as cover the cut surface.      The lug holes were drilled and a metal shim was placed to protect the   patella from retractors and saw blade during the procedure.      At this point, attention was now directed to the femur.  The femoral   canal was opened with a drill, irrigated to try to prevent fat emboli.  An   intramedullary rod was passed at 3 degrees valgus, 9 mm of bone was   resected off the distal femur.  Following this resection, the tibia was   subluxated anteriorly.  Using the extramedullary guide, 4 mm of bone was resected off   the proximal medial tibia.  We confirmed the gap would be   stable medially and laterally with a size 5 spacer block as well as confirmed that the tibial cut  was perpendicular in the coronal plane, checking with an alignment rod.      Once this was done, I sized the femur to be a size 4 in the anterior-   posterior dimension, chose a standard component based on medial and   lateral dimension.  The size 4 rotation block was then pinned in   position anterior referenced using the C-clamp to set rotation.  The   anterior, posterior, and  chamfer cuts were made without difficulty nor   notching making certain that I was along the anterior cortex to help   with flexion gap stability.      The final box cut was made off the lateral aspect of distal femur.      At this point, the tibia was sized to be a size 5.  The size 5 tray was   then pinned in position through the medial third of the tubercle,   drilled, and keel punched.  Trial reduction was now carried with a 4 femur,  5 tibia, a size 6 mm PS insert, and the 32 anatomic patella botton.  The knee was brought to full extension with good flexion stability with the patella   tracking through the trochlea without application of pressure.  Given   all these findings the trial components removed.  Final components were   opened and cement was mixed.  The knee was irrigated with normal saline solution and pulse lavage.  The synovial lining was   then injected with 30 cc of 0.25% Marcaine with epinephrine, 1 cc of Toradol and 30 cc of NS for a total of 61 cc.     Final implants were then cemented onto cleaned and dried cut surfaces of bone with the knee brought to extension with a size 6 mm PS trial insert.      Once the cement had fully cured, excess cement was removed   throughout the knee.  I confirmed that I was satisfied with the range of   motion and stability, and the final size 6 mm PS AOX insert was chosen.  It was   placed into the knee.      The tourniquet had been let down at 34 minutes.  The tourniquet did not work very  well during case resulting in an increased blood loss.  Additionally,  we put it down and re-inflated a couple times during the procedure.  No significant   hemostasis was required.  The extensor mechanism was then reapproximated using #1 Vicryl and #1 Stratafix sutures with the knee   in flexion.  The   remaining wound was closed with 2-0 Vicryl and running 4-0 Monocryl.   The knee was cleaned, dried, dressed sterilely using Dermabond and   Aquacel dressing.  The patient was then   brought to recovery room in stable condition, tolerating the procedure   well.   Please note that Physician Assistant, Costella Hatcher, PA-C was present for the entirety of the case, and was utilized for pre-operative positioning, peri-operative retractor management, general facilitation of the procedure and for primary wound closure at the end of the case.              Pietro Cassis Alvan Dame, M.D.    10/17/2021 9:06 AM

## 2021-10-17 NOTE — Evaluation (Signed)
Physical Therapy Evaluation Patient Details Name: Meghan Wilson MRN: [id number] DOB: 1961-03-29 Today's Date: 10/17/2021  History of Present Illness  Pt s/p R TKR and with hx of hardware removal 09/18/21, bipolar, and tibial fx in 2003  Clinical Impression  Pt s/p R TKR and presents with decreased R LE strength/ROM and post op pain limiting functional mobility.  Pt should progress to dc home with family assist. Pt states she has follow up OP PT at [facility] but not sure when or if first appt has been made.     Recommendations for follow up therapy are one component of a multi-disciplinary discharge planning process, led by the attending physician.  Recommendations may be updated based on patient status, additional functional criteria and insurance authorization.  Follow Up Recommendations Follow physician's recommendations for discharge plan and follow up therapies    Assistance Recommended at Discharge Intermittent Supervision/Assistance  Patient can return home with the following  A little help with walking and/or transfers;A little help with bathing/dressing/bathroom;Assistance with cooking/housework;Assist for transportation;Help with stairs or ramp for entrance    Equipment Recommendations None recommended by PT  Recommendations for Other Services       Functional Status Assessment Patient has had a recent decline in their functional status and demonstrates the ability to make significant improvements in function in a reasonable and predictable amount of time.     Precautions / Restrictions Precautions Precautions: Fall;Knee Restrictions Weight Bearing Restrictions: No Other Position/Activity Restrictions: WBAT      Mobility  Bed Mobility Overal bed mobility: Needs Assistance Bed Mobility: Sit to Supine       Sit to supine: Min guard   General bed mobility comments: min guard for safety    Transfers Overall transfer level: Needs assistance Equipment used: Rolling  walker (2 wheels) Transfers: Sit to/from Stand Sit to Stand: Min guard           General transfer comment: steady assist only    Ambulation/Gait Ambulation/Gait assistance: Min assist, Min guard Gait Distance (Feet): 70 Feet Assistive device: Rolling walker (2 wheels) Gait Pattern/deviations: Step-to pattern, Decreased step length - right, Decreased step length - left, Shuffle, Trunk flexed Gait velocity: decr     General Gait Details: cues for posture, position from RW and sequence  Stairs            Wheelchair Mobility    Modified Rankin (Stroke Patients Only)       Balance Overall balance assessment: Mild deficits observed, not formally tested                                           Pertinent Vitals/Pain Pain Assessment Pain Assessment: Faces Faces Pain Scale: Hurts little more Pain Location: R knee Pain Descriptors / Indicators: Aching, Sore Pain Intervention(s): Limited activity within patient's tolerance, Monitored during session, Premedicated before session, Ice applied    Home Living Family/patient expects to be discharged to:: Private residence Living Arrangements: Spouse/significant other Available Help at Discharge: Family;Available 24 hours/day Type of Home: House Home Access: Ramped entrance       Home Layout: One level Home Equipment: Conservation officer, nature (2 wheels);Other (comment) (frame around one comode)      Prior Function Prior Level of Function : Independent/Modified Independent                     Hand Dominance  Extremity/Trunk Assessment   Upper Extremity Assessment Upper Extremity Assessment: Overall WFL for tasks assessed    Lower Extremity Assessment Lower Extremity Assessment: RLE deficits/detail    Cervical / Trunk Assessment Cervical / Trunk Assessment: Normal  Communication   Communication: No difficulties  Cognition Arousal/Alertness: Awake/alert Behavior During Therapy: WFL  for tasks assessed/performed Overall Cognitive Status: Within Functional Limits for tasks assessed                                          General Comments      Exercises Total Joint Exercises Ankle Circles/Pumps: AROM, Both, 15 reps, Supine   Assessment/Plan    PT Assessment Patient needs continued PT services  PT Problem List Decreased strength;Decreased range of motion;Decreased activity tolerance;Decreased mobility;Decreased knowledge of use of DME;Pain       PT Treatment Interventions DME instruction;Gait training;Stair training;Functional mobility training;Therapeutic activities;Therapeutic exercise;Patient/family education    PT Goals (Current goals can be found in the Care Plan section)  Acute Rehab PT Goals Patient Stated Goal: regain iND PT Goal Formulation: With patient Time For Goal Achievement: 10/24/21 Potential to Achieve Goals: Good    Frequency 7X/week     Co-evaluation               AM-PAC PT "6 Clicks" Mobility  Outcome Measure Help needed turning from your back to your side while in a flat bed without using bedrails?: A Little Help needed moving from lying on your back to sitting on the side of a flat bed without using bedrails?: A Little Help needed moving to and from a bed to a chair (including a wheelchair)?: A Little Help needed standing up from a chair using your arms (e.g., wheelchair or bedside chair)?: A Little Help needed to walk in hospital room?: A Little Help needed climbing 3-5 steps with a railing? : A Lot 6 Click Score: 17    End of Session Equipment Utilized During Treatment: Gait belt Activity Tolerance: Patient tolerated treatment well Patient left: in bed;with call bell/phone within reach;with chair alarm set Nurse Communication: Mobility status PT Visit Diagnosis: Difficulty in walking, not elsewhere classified (R26.2);Pain Pain - Right/Left: Right Pain - part of body: Knee    Time: 1440-1502 PT  Time Calculation (min) (ACUTE ONLY): 22 min   Charges:   PT Evaluation $PT Eval Low Complexity: 1 Low          [place] Pager [phone] Office [phone]   Johnpaul Gillentine 10/17/2021, 3:17 PM

## 2021-10-18 ENCOUNTER — Other Ambulatory Visit (HOSPITAL_COMMUNITY): Payer: Self-pay

## 2021-10-18 DIAGNOSIS — M1731 Unilateral post-traumatic osteoarthritis, right knee: Secondary | ICD-10-CM | POA: Diagnosis not present

## 2021-10-18 LAB — CBC
HCT: 25.7 % — ABNORMAL LOW (ref 36.0–46.0)
Hemoglobin: 7.9 g/dL — ABNORMAL LOW (ref 12.0–15.0)
MCH: 26.2 pg (ref 26.0–34.0)
MCHC: 30.7 g/dL (ref 30.0–36.0)
MCV: 85.1 fL (ref 80.0–100.0)
Platelets: 264 10*3/uL (ref 150–400)
RBC: 3.02 MIL/uL — ABNORMAL LOW (ref 3.87–5.11)
RDW: 15.4 % (ref 11.5–15.5)
WBC: 11.7 10*3/uL — ABNORMAL HIGH (ref 4.0–10.5)
nRBC: 0 % (ref 0.0–0.2)

## 2021-10-18 LAB — BASIC METABOLIC PANEL
Anion gap: 5 (ref 5–15)
BUN: 10 mg/dL (ref 8–23)
CO2: 26 mmol/L (ref 22–32)
Calcium: 8 mg/dL — ABNORMAL LOW (ref 8.9–10.3)
Chloride: 106 mmol/L (ref 98–111)
Creatinine, Ser: 0.6 mg/dL (ref 0.44–1.00)
GFR, Estimated: >60 mL/min (ref >60)
Glucose, Bld: 129 mg/dL — ABNORMAL HIGH (ref 70–99)
Potassium: 3.8 mmol/L (ref 3.5–5.1)
Sodium: 137 mmol/L (ref 135–145)

## 2021-10-18 LAB — GLUCOSE, CAPILLARY
Glucose-Capillary: 131 mg/dL — ABNORMAL HIGH (ref 70–99)
Glucose-Capillary: 129 mg/dL — ABNORMAL HIGH (ref 70–99)

## 2021-10-18 MED ORDER — POLYETHYLENE GLYCOL 3350 17 G PO PACK
17.0000 g | PACK | Freq: Every day | ORAL | 0 refills | Status: AC | PRN
  Filled 2021-10-18: qty 14, 14d supply, fill #0

## 2021-10-18 MED ORDER — FERROUS SULFATE 325 (65 FE) MG PO TABS
325.0000 mg | ORAL_TABLET | Freq: Two times a day (BID) | ORAL | 0 refills | Status: DC
  Filled 2021-10-18: qty 28, 14d supply, fill #0

## 2021-10-18 MED ORDER — ACETAMINOPHEN 325 MG PO TABS: 1000.0000 mg | ORAL_TABLET | Freq: Four times a day (QID) | ORAL | Status: DC

## 2021-10-18 MED ORDER — OXYCODONE HCL 5 MG PO TABS
5.0000 mg | ORAL_TABLET | ORAL | 0 refills | Status: DC | PRN
  Filled 2021-10-18: qty 42, 4d supply, fill #0

## 2021-10-18 MED ORDER — TRANEXAMIC ACID 650 MG PO TABS
1950.0000 mg | ORAL_TABLET | Freq: Every day | ORAL | Status: DC
  Administered 2021-10-18: 1950 mg via ORAL
  Filled 2021-10-18: qty 3

## 2021-10-18 MED ORDER — ASPIRIN 81 MG PO CHEW
81.0000 mg | CHEWABLE_TABLET | Freq: Two times a day (BID) | ORAL | 0 refills | Status: AC
  Filled 2021-10-18: qty 56, 28d supply, fill #0

## 2021-10-18 MED ORDER — METHOCARBAMOL 500 MG PO TABS
500.0000 mg | ORAL_TABLET | Freq: Four times a day (QID) | ORAL | 0 refills | Status: DC | PRN
  Filled 2021-10-18: qty 40, 10d supply, fill #0

## 2021-10-18 MED ORDER — FERROUS SULFATE 325 (65 FE) MG PO TABS
325.0000 mg | ORAL_TABLET | Freq: Two times a day (BID) | ORAL | Status: DC
  Administered 2021-10-18: 325 mg via ORAL
  Filled 2021-10-18: qty 1

## 2021-10-18 NOTE — Progress Notes (Signed)
The patient is alert and oriented and has been seen by her physician. The orders for discharge were written. IV has been removed. Went over discharge instructions with patient. She is being discharged via wheelchair with all of her belongings.   

## 2021-10-18 NOTE — TOC Transition Note (Signed)
Transition of Care [facility]) - CM/SW Discharge Note   Patient Details  Name: Meghan Wilson MRN: [id number] Date of Birth: 1961-01-06  [facility]) CM/SW Contact:  Lennart Pall, LCSW Phone Number: 10/18/2021, 2:55 PM   Clinical Narrative:    Met with pt and confirming she has all needed DME at home. Planning for OPPT at [facility].  No TOC needs.   Final next level of care: OP Rehab Barriers to Discharge: No Barriers Identified   Patient Goals and CMS Choice Patient states their goals for this hospitalization and ongoing recovery are:: return home      Discharge Placement                       Discharge Plan and Services                DME Arranged: N/A DME Agency: NA                  Social Determinants of Health (SDOH) Interventions     Readmission Risk Interventions No flowsheet data found.

## 2021-10-18 NOTE — Progress Notes (Signed)
Patient ID: Meghan Wilson, female   DOB: 09-28-1960, 61 y.o.   MRN: [id number] Subjective: 1 Day Post-Op Procedure(s) (LRB): TOTAL KNEE ARTHROPLASTY (Right)    Patient reports pain as moderate.  No events overnight  Great motivation  Objective:   VITALS:   Vitals:   10/18/21 0142 10/18/21 0455  BP: 106/73 (!) 141/71  Pulse: 90 95  Resp: 17 17  Temp: 98.4 F (36.9 C) 98.3 F (36.8 C)  SpO2: 100% 99%    Neurovascular intact Incision: dressing C/D/I  LABS Recent Labs    10/18/21 0308  HGB 7.9*  HCT 25.7*  WBC 11.7*  PLT 264    Recent Labs    10/18/21 0308  NA 137  K 3.8  BUN 10  CREATININE 0.60  GLUCOSE 129*    No results for input(s): LABPT, INR in the last 72 hours.   Assessment/Plan: 1 Day Post-Op Procedure(s) (LRB): TOTAL KNEE ARTHROPLASTY (Right)   Advance diet Up with therapy  ABAL - will give a dose of TXA today Iron supplement for 2 weeks  Still likely home today after therapy

## 2021-10-18 NOTE — Plan of Care (Signed)
  Problem: Education: Goal: Knowledge of General Education information will improve Description: Including pain rating scale, medication(s)/side effects and non-pharmacologic comfort measures Outcome: Progressing   Problem: Health Behavior/Discharge Planning: Goal: Ability to manage health-related needs will improve Outcome: Progressing   Problem: Pain Managment: Goal: General experience of comfort will improve Outcome: Progressing   

## 2021-10-18 NOTE — Progress Notes (Signed)
Physical Therapy Treatment Patient Details Name: Meghan Wilson MRN: [id number] DOB: 05/16/1961 Today's Date: 10/18/2021   History of Present Illness Pt s/p R TKR and with hx of hardware removal 09/18/21, bipolar, and tibial fx in 2003    PT Comments    POD # 1 General Comments: AxO x 3 very pleasant Lady eager to go home.  Meghan Wilson (sig other) present during session and very attentive/helpful.  Assisted OOB to amb.  General bed mobility comments: pt self able to raise R LE and get in/OOB.  General transfer comment: good safety cognition and use of hands to steady self.  Had Meghan Wilson "hands on" assist with amb a functional distance in hallway.  Pain 5/10.  No other issues just feeling a little tired.  No stairs to enter home, pt has a RAMP. Then returned to room to perform some TE's following HEP handout.  Instructed on proper tech, freq as well as use of ICE.   Addressed all mobility questions, discussed appropriate activity, educated on use of ICE.  Pt ready for D/C to home.   Recommendations for follow up therapy are one component of a multi-disciplinary discharge planning process, led by the attending physician.  Recommendations may be updated based on patient status, additional functional criteria and insurance authorization.  Follow Up Recommendations  Follow physician's recommendations for discharge plan and follow up therapies     Assistance Recommended at Discharge Intermittent Supervision/Assistance  Patient can return home with the following A little help with walking and/or transfers;A little help with bathing/dressing/bathroom;Assistance with cooking/housework;Assist for transportation;Help with stairs or ramp for entrance   Equipment Recommendations  None recommended by PT    Recommendations for Other Services       Precautions / Restrictions Precautions Precautions: Fall;Knee Precaution Comments: instructed no pillow under knee Restrictions Weight Bearing Restrictions:  No Other Position/Activity Restrictions: WBAT     Mobility  Bed Mobility Overal bed mobility: Needs Assistance Bed Mobility: Supine to Sit, Sit to Supine     Supine to sit: Supervision Sit to supine: Supervision   General bed mobility comments: pt self able to raise R LE and get in/OOB    Transfers Overall transfer level: Needs assistance Equipment used: Rolling walker (2 wheels) Transfers: Sit to/from Stand Sit to Stand: Supervision           General transfer comment: good safety cognition and use of hands to steady self    Ambulation/Gait Ambulation/Gait assistance: Supervision Gait Distance (Feet): 115 Feet Assistive device: Rolling walker (2 wheels) Gait Pattern/deviations: Step-to pattern, Decreased step length - right, Decreased step length - left, Shuffle, Trunk flexed Gait velocity: decreased     General Gait Details: had Meghan Wilson  "hands on" assist with amb pt a functional distance in hallway.   Stairs Stairs:  (no stairs/has a RAMP)           Wheelchair Mobility    Modified Rankin (Stroke Patients Only)       Balance                                            Cognition Arousal/Alertness: Awake/alert Behavior During Therapy: WFL for tasks assessed/performed Overall Cognitive Status: Within Functional Limits for tasks assessed  General Comments: AxO x 3 very pleasant Lady eager to go home.        Exercises  Total Knee Replacement TE's following HEP handout 10 reps B LE ankle pumps 05 reps towel squeezes 05 reps knee presses 05 reps heel slides  05 reps SAQ's 05 reps SLR's 05 reps ABD Educated on use of gait belt to assist with TE's Followed by ICE     General Comments        Pertinent Vitals/Pain Pain Assessment Pain Assessment: 0-10 Pain Score: 5  Pain Location: R knee Pain Descriptors / Indicators: Aching, Sore, Operative site guarding Pain Intervention(s):  Monitored during session, Premedicated before session, Repositioned, Ice applied    Home Living                          Prior Function            PT Goals (current goals can now be found in the care plan section) Progress towards PT goals: Progressing toward goals    Frequency    7X/week      PT Plan Current plan remains appropriate    Co-evaluation              AM-PAC PT "6 Clicks" Mobility   Outcome Measure  Help needed turning from your back to your Wilson while in a flat bed without using bedrails?: None Help needed moving from lying on your back to sitting on the Wilson of a flat bed without using bedrails?: None Help needed moving to and from a bed to a chair (including a wheelchair)?: None Help needed standing up from a chair using your arms (e.g., wheelchair or bedside chair)?: None Help needed to walk in hospital room?: A Little Help needed climbing 3-5 steps with a railing? : A Little 6 Click Score: 22    End of Session Equipment Utilized During Treatment: Gait belt   Patient left: in bed;with call bell/phone within reach;with chair alarm set Nurse Communication: Mobility status PT Visit Diagnosis: Difficulty in walking, not elsewhere classified (R26.2);Pain Pain - Right/Left: Right Pain - part of body: Knee     Time: 1455-1520 PT Time Calculation (min) (ACUTE ONLY): 25 min  Charges:  $Gait Training: 8-22 mins $Therapeutic Exercise: 8-22 mins                     Rica Koyanagi  PTA Acute  Rehabilitation Services Pager      [phone] Office      [phone]

## 2021-10-21 ENCOUNTER — Encounter (HOSPITAL_COMMUNITY): Payer: Self-pay | Admitting: Orthopedic Surgery

## 2021-10-23 ENCOUNTER — Other Ambulatory Visit (HOSPITAL_COMMUNITY): Payer: Self-pay

## 2021-10-23 MED ORDER — OXYCODONE HCL 5 MG PO TABS
5.0000 mg | ORAL_TABLET | ORAL | 0 refills | Status: DC | PRN
  Filled 2021-10-23: qty 42, 4d supply, fill #0

## 2021-10-28 ENCOUNTER — Other Ambulatory Visit (HOSPITAL_COMMUNITY): Payer: Self-pay

## 2021-10-28 MED ORDER — OXYCODONE HCL 5 MG PO TABS
5.0000 mg | ORAL_TABLET | ORAL | 0 refills | Status: DC | PRN
  Filled 2021-10-28: qty 42, 4d supply, fill #0

## 2021-10-30 ENCOUNTER — Other Ambulatory Visit (HOSPITAL_COMMUNITY): Payer: Self-pay

## 2021-10-30 NOTE — Discharge Summary (Signed)
Patient ID: Meghan Wilson MRN: [id number] DOB/AGE: 62-Dec-1962 37 y.o.  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  Discharge Diagnoses:  Principal Problem:   S/P total knee arthroplasty, right   Past Medical History:  Diagnosis Date   Anxiety    Arthritis    Bipolar depression ([place]) 09/2015   Depression 06/27/2021   Dizziness    GERD (gastroesophageal reflux disease)    History of colon polyps    History of kidney stones    History of patellar fracture 2016   History of tibial fracture 2016   Hyperlipidemia    Night terrors    Night terrors, adult 09/2015   Pain of right knee after injury 03/10/2018   Post concussion syndrome 12/20/2019   Pre-diabetes    Seasonal allergies 11/09/2020   Sleep apnea    doesnt wear c-pap    Surgeries: Procedure(s): TOTAL KNEE ARTHROPLASTY on 10/17/2021   Consultants:   Discharged Condition: Improved  Hospital Course: Meghan Wilson is an 61 y.o. female who was admitted 10/17/2021 for operative treatment ofS/P total knee arthroplasty, right. Patient has severe unremitting pain that affects sleep, daily activities, and work/hobbies. After pre-op clearance the patient was taken to the operating room on 10/17/2021 and underwent  Procedure(s): TOTAL KNEE ARTHROPLASTY.    Patient was given perioperative antibiotics:  Anti-infectives (From admission, onward)    Start     Dose/Rate Route Frequency Ordered Stop   10/17/21 1330  ceFAZolin (ANCEF) IVPB 2g/100 mL premix        2 g 200 mL/hr over 30 Minutes Intravenous Every 6 hours 10/17/21 1117 10/17/21 2011   10/17/21 0600  ceFAZolin (ANCEF) IVPB 2g/100 mL premix        2 g 200 mL/hr over 30 Minutes Intravenous On call to O.R. 10/17/21 0516 10/17/21 0730        Patient was given sequential compression devices, early ambulation, and chemoprophylaxis to prevent DVT. Patient worked with PT and was  meeting their goals regarding safe ambulation and transfers.  Patient benefited maximally from hospital stay and there were no complications.    Recent vital signs: No data found.   Recent laboratory studies: No results for input(s): WBC, HGB, HCT, PLT, NA, K, CL, CO2, BUN, CREATININE, GLUCOSE, INR, CALCIUM in the last 72 hours.  Invalid input(s): PT, 2   Discharge Medications:   Allergies as of 10/18/2021       Reactions   Depakote [divalproex Sodium] Other (See Comments)   Hair fell out   Ibuprofen Nausea And Vomiting   Prednisone Other (See Comments)   Constipation, nervousness All steroids   Latex Rash        Medication List     STOP taking these medications    ondansetron 4 MG tablet Commonly known as: ZOFRAN   oxyCODONE 5 MG immediate release tablet Commonly known as: Oxy IR/ROXICODONE       TAKE these medications    acetaminophen 325 MG tablet Commonly known as: TYLENOL Take 3 tablets (975 mg total) by mouth every 6 (six) hours.   aspirin 81 MG chewable tablet Chew 1 tablet (81 mg total) by mouth 2 (two) times daily for 28 days.   atorvastatin 80 MG tablet Commonly known as: LIPITOR Take 1 tablet (80 mg total) by mouth daily.   B-12 PO Take 1,000 mcg by mouth 2 (two) times daily.   docusate sodium 100 MG capsule Commonly known as: COLACE  Take 100 mg by mouth See admin instructions. Take twice a day every other day   Dry Eye Relief Drops 0.2-0.2-1 % Soln Generic drug: Glycerin-Hypromellose-PEG 400 Place 1 drop into both eyes 3 (three) times daily.   ferrous sulfate 325 (65 FE) MG tablet Take 1 tablet (325 mg total) by mouth 2 (two) times daily with a meal for 14 days.   FLAX SEEDS PO Take 500 mg by mouth 2 (two) times daily.   fluticasone 50 MCG/ACT nasal spray Commonly known as: FLONASE Place 1 spray into both nostrils daily as needed for allergies or rhinitis What changed:  when to take this reasons to take this   IBgard 90 MG  Cpcr Generic drug: Peppermint Oil Take as directed What changed:  how much to take how to take this when to take this   lamoTRIgine 100 MG tablet Commonly known as: LaMICtal Take 1 tablet (100 mg total) by mouth daily.   Linzess 145 MCG Caps capsule Generic drug: linaclotide Take 1 capsule (145 mcg total) by mouth daily before breakfast.   metFORMIN 500 MG 24 hr tablet Commonly known as: GLUCOPHAGE-XR TAKE 1 TABLET (500 MG TOTAL) BY MOUTH DAILY WITH BREAKFAST.   methocarbamol 500 MG tablet Commonly known as: ROBAXIN Take 1 tablet (500 mg total) by mouth every 6 (six) hours as needed for muscle spasms.   ondansetron 4 MG disintegrating tablet Commonly known as: ZOFRAN-ODT DISSOLVE 1 TABLET UNDER THE TONGUE EVERY 8 HOURS AS NEEDED FOR NAUSEA AND VOMITING.   Oscimin 0.125 MG Subl Generic drug: Hyoscyamine Sulfate SL Place 1 tablet under the tongue every 8 (eight) hours as needed.   pantoprazole 40 MG tablet Commonly known as: PROTONIX Take 1 tablet (40 mg total) by mouth daily. Try to taper off and then take only as needed   polyethylene glycol 17 g packet Commonly known as: MIRALAX / GLYCOLAX Mix 1 packet (17 g total) in 8 oz clear liquid and drink by mouth daily as needed for mild constipation.   prazosin 1 MG capsule Commonly known as: MINIPRESS Take 2 capsules by mouth at bedtime What changed:  when to take this reasons to take this Another medication with the same name was removed. Continue taking this medication, and follow the directions you see here.   triamcinolone cream 0.1 % Commonly known as: KENALOG Apply 1 application topically daily. Apply underneath breast as needed What changed:  when to take this reasons to take this additional instructions   triamcinolone ointment 0.5 % Commonly known as: KENALOG Apply 1 application topically daily. Apply to hands as needed daily What changed:  when to take this reasons to take this additional  instructions   Vitamin D3 125 MCG (5000 UT) Tabs Take 5,000 Units by mouth daily.               Discharge Care Instructions  (From admission, onward)           Start     Ordered   10/18/21 0000  Change dressing       Comments: Maintain surgical dressing until follow up in the clinic. If the edges start to pull up, may reinforce with tape. If the dressing is no longer working, may remove and cover with gauze and tape, but must keep the area dry and clean.  Call with any questions or concerns.   10/18/21 0742            Diagnostic Studies: No results found.  Disposition: Discharge disposition:  01-Home or Self Care       Discharge Instructions     Call MD / Call 911   Complete by: As directed    If you experience chest pain or shortness of breath, CALL 911 and be transported to the hospital emergency room.  If you develope a fever above 101 F, pus (white drainage) or increased drainage or redness at the wound, or calf pain, call your surgeon's office.   Change dressing   Complete by: As directed    Maintain surgical dressing until follow up in the clinic. If the edges start to pull up, may reinforce with tape. If the dressing is no longer working, may remove and cover with gauze and tape, but must keep the area dry and clean.  Call with any questions or concerns.   Constipation Prevention   Complete by: As directed    Drink plenty of fluids.  Prune juice may be helpful.  You may use a stool softener, such as Colace (over the counter) 100 mg twice a day.  Use MiraLax (over the counter) for constipation as needed.   Diet - low sodium heart healthy   Complete by: As directed    Increase activity slowly as tolerated   Complete by: As directed    Weight bearing as tolerated with assist device (walker, cane, etc) as directed, use it as long as suggested by your surgeon or therapist, typically at least 4-6 weeks.   Post-operative opioid taper instructions:   Complete by:  As directed    POST-OPERATIVE OPIOID TAPER INSTRUCTIONS: It is important to wean off of your opioid medication as soon as possible. If you do not need pain medication after your surgery it is ok to stop day one. Opioids include: Codeine, Hydrocodone(Norco, Vicodin), Oxycodone(Percocet, oxycontin) and hydromorphone amongst others.  Long term and even short term use of opiods can cause: Increased pain response Dependence Constipation Depression Respiratory depression And more.  Withdrawal symptoms can include Flu like symptoms Nausea, vomiting And more Techniques to manage these symptoms Hydrate well Eat regular healthy meals Stay active Use relaxation techniques(deep breathing, meditating, yoga) Do Not substitute Alcohol to help with tapering If you have been on opioids for less than two weeks and do not have pain than it is ok to stop all together.  Plan to wean off of opioids This plan should start within one week post op of your joint replacement. Maintain the same interval or time between taking each dose and first decrease the dose.  Cut the total daily intake of opioids by one tablet each day Next start to increase the time between doses. The last dose that should be eliminated is the evening dose.      TED hose   Complete by: As directed    Use stockings (TED hose) for 2 weeks on both leg(s).  You may remove them at night for sleeping.          Signed: Irving Copas 10/30/2021, 7:42 AM

## 2021-10-31 ENCOUNTER — Other Ambulatory Visit (HOSPITAL_COMMUNITY): Payer: Self-pay

## 2021-10-31 MED ORDER — OXYCODONE HCL 5 MG PO TABS
5.0000 mg | ORAL_TABLET | ORAL | 0 refills | Status: DC | PRN
  Filled 2021-10-31: qty 42, 4d supply, fill #0

## 2021-11-01 ENCOUNTER — Other Ambulatory Visit (HOSPITAL_COMMUNITY): Payer: Self-pay

## 2021-11-06 ENCOUNTER — Other Ambulatory Visit (HOSPITAL_COMMUNITY): Payer: Self-pay

## 2021-11-06 MED ORDER — OXYCODONE HCL 5 MG PO TABS
5.0000 mg | ORAL_TABLET | Freq: Four times a day (QID) | ORAL | 0 refills | Status: DC | PRN
  Filled 2021-11-06: qty 42, 5d supply, fill #0

## 2021-11-07 ENCOUNTER — Other Ambulatory Visit: Payer: Self-pay

## 2021-11-07 ENCOUNTER — Ambulatory Visit (INDEPENDENT_AMBULATORY_CARE_PROVIDER_SITE_OTHER): Payer: Medicaid Other | Admitting: Neurology

## 2021-11-07 DIAGNOSIS — E669 Obesity, unspecified: Secondary | ICD-10-CM

## 2021-11-07 DIAGNOSIS — R519 Headache, unspecified: Secondary | ICD-10-CM

## 2021-11-07 DIAGNOSIS — G4720 Circadian rhythm sleep disorder, unspecified type: Secondary | ICD-10-CM

## 2021-11-07 DIAGNOSIS — G4733 Obstructive sleep apnea (adult) (pediatric): Secondary | ICD-10-CM | POA: Diagnosis not present

## 2021-11-07 DIAGNOSIS — G4719 Other hypersomnia: Secondary | ICD-10-CM

## 2021-11-11 ENCOUNTER — Other Ambulatory Visit (HOSPITAL_COMMUNITY): Payer: Self-pay

## 2021-11-11 MED ORDER — OXYCODONE HCL 5 MG PO TABS
5.0000 mg | ORAL_TABLET | Freq: Four times a day (QID) | ORAL | 0 refills | Status: DC | PRN
  Filled 2021-11-11: qty 42, 6d supply, fill #0

## 2021-11-11 MED ORDER — PREGABALIN 75 MG PO CAPS
75.0000 mg | ORAL_CAPSULE | Freq: Every evening | ORAL | 0 refills | Status: DC | PRN
  Filled 2021-11-11: qty 10, 10d supply, fill #0

## 2021-11-15 ENCOUNTER — Other Ambulatory Visit (HOSPITAL_COMMUNITY): Payer: Self-pay

## 2021-11-15 MED ORDER — CELECOXIB 200 MG PO CAPS
200.0000 mg | ORAL_CAPSULE | Freq: Every day | ORAL | 1 refills | Status: DC
  Filled 2021-11-15: qty 30, 30d supply, fill #0

## 2021-11-15 MED ORDER — PREGABALIN 75 MG PO CAPS
75.0000 mg | ORAL_CAPSULE | Freq: Two times a day (BID) | ORAL | 0 refills | Status: DC
  Filled 2021-11-15: qty 60, 30d supply, fill #0

## 2021-11-15 MED ORDER — OXYCODONE HCL 5 MG PO TABS
5.0000 mg | ORAL_TABLET | Freq: Four times a day (QID) | ORAL | 0 refills | Status: DC | PRN
  Filled 2021-11-15 – 2021-11-18 (×2): qty 42, 6d supply, fill #0

## 2021-11-15 MED ORDER — METHOCARBAMOL 500 MG PO TABS
500.0000 mg | ORAL_TABLET | Freq: Four times a day (QID) | ORAL | 1 refills | Status: DC | PRN
  Filled 2021-11-15: qty 60, 15d supply, fill #0

## 2021-11-15 NOTE — Procedures (Signed)
PATIENT'S NAME:  Meghan Wilson ?DOB:      1961-08-10      ?MR#:    [id number]     ?DATE OF RECORDING: 11/07/2021 ?REFERRING M.D.:  Gilles Chiquito, MD ?Study Performed:   Baseline Polysomnogram ?HISTORY: 61 year old woman with a history of reflux disease, arthritis, right leg injury with hardware in place, diabetes, hyperlipidemia, allergies, mood disorder, and obesity, who reports snoring and excessive daytime somnolence. She was previously diagnosed with obstructive sleep apnea and has CPAP in the past.  She is currently not on CPAP therapy. The patient endorsed the Epworth Sleepiness Scale at 19 points. The patient's weight 206 pounds with a height of 62 (inches), resulting in a BMI of 37.7 kg/m2. The patient's neck circumference measured 14.25 inches. ? ?CURRENT MEDICATIONS: Tylenol, Lipitor, Vitamin D3, B-12, Flonase, Levsin, Lamictal, Linzess, Glucophage-XR, Zofran, Protonix, Ibgard, Mimipress, Kenalog ?  ?PROCEDURE:  This is a multichannel digital polysomnogram utilizing the Somnostar 11.2 system.  Electrodes and sensors were applied and monitored per AASM Specifications.   EEG, EOG, Chin and Limb EMG, were sampled at 200 Hz.  ECG, Snore and Nasal Pressure, Thermal Airflow, Respiratory Effort, CPAP Flow and Pressure, Oximetry was sampled at 50 Hz. Digital video and audio were recorded.     ? ?BASELINE STUDY ? ?Lights Out was at 22:02 and Lights On at 05:34.  Total recording time (TRT) was 452.5 minutes, with a total sleep time (TST) of 358.5 minutes.   The patient's sleep latency was 41 minutes, which is delayed. REM latency was 56 minutes, which is mildly reduced. The sleep efficiency was 79.2%.  ?   ?SLEEP ARCHITECTURE: WASO (Wake after sleep onset) was 52.5 minutes with mild to moderate sleep fragmentation noted. There were 16.5 minutes in Stage N1, 258 minutes Stage N2, 17 minutes Stage N3 and 67 minutes in Stage REM.  The percentage of Stage N1 was 4.6%, Stage N2 was 72%, which is increased, Stage N3 was 4.7%  and Stage R (REM sleep) was 18.7%, which is near-normal. The arousals were noted as: 105 were spontaneous, 0 were associated with PLMs, 30 were associated with respiratory events. ? ?RESPIRATORY ANALYSIS:  There were a total of 111 respiratory events:  59 obstructive apneas, 5 central apneas and 3 mixed apneas with a total of 67 apneas and an apnea index (AI) of 11.2 /hour. There were 44 hypopneas with a hypopnea index of 7.4 /hour. The patient also had 0 respiratory event related arousals (RERAs).  ?    ?The total APNEA/HYPOPNEA INDEX (AHI) was 18.6/hour and the total RESPIRATORY DISTURBANCE INDEX was  18.6 /hour.  37 events occurred in REM sleep and 92 events in NREM. The REM AHI was  33.1 /hour, versus a non-REM AHI of 15.2. The patient spent 189.5 minutes of total sleep time in the supine position and 169 minutes in non-supine.. The supine AHI was 24.1 versus a non-supine AHI of 12.4. ? ?OXYGEN SATURATION & C02:  The Wake baseline 02 saturation was 98%, with the lowest being 75%. Time spent below 89% saturation equaled 41 minutes. ?PERIODIC LIMB MOVEMENTS: The patient had a total of 0 Periodic Limb Movements.  The Periodic Limb Movement (PLM) index was 0 and the PLM Arousal index was 0/hour. ? ?Audio and video analysis did not show any abnormal or unusual movements, behaviors, phonations or vocalizations. The patient took 1 bathroom break. Mild to moderate snoring was noted. The EKG was in keeping with normal sinus rhythm (NSR). ? ?Post-study, the patient indicated that sleep  was worse than usual.  ? ?IMPRESSION: ? ?Obstructive Sleep Apnea (OSA) ?Dysfunctions associated with sleep stages or arousal from sleep ? ?RECOMMENDATIONS: ? ?This study demonstrates moderate to severe obstructive sleep apnea, with a total AHI of 18.6/hour, REM AHI of 33.1/hour, supine AHI of 24.1/hour and O2 nadir of 75%. Treatment with positive airway pressure in the form of CPAP is recommended. This will require a full night titration  study to optimize and individualize therapy. Other treatment options may include the use of an oral appliance or surgical placement of a hypoglossal nerve stimulator in selective patients. Please note that untreated obstructive sleep apnea may carry additional perioperative morbidity. Patients with significant obstructive sleep apnea should receive perioperative PAP therapy and the surgeons and particularly the anesthesiologist should be informed of the diagnosis and the severity of the sleep disordered breathing. ?This study shows sleep fragmentation and abnormal sleep stage percentages; these are nonspecific findings and per se do not signify an intrinsic sleep disorder or a cause for the patient's sleep-related symptoms. Causes include (but are not limited to) the first night effect of the sleep study, circadian rhythm disturbances, medication effect or an underlying mood disorder or medical problem.  ?The patient should be cautioned not to drive, work at heights, or operate dangerous or heavy equipment when tired or sleepy. Review and reiteration of good sleep hygiene measures should be pursued with any patient. ?The patient will be seen in follow-up in the sleep clinic at [facility] for discussion of the test results, symptom and treatment compliance review, further management strategies, etc. The referring provider will be notified of the test results. ? ?I certify that I have reviewed the entire raw data recording prior to the issuance of this report in accordance with the Standards of Accreditation of the [place] [employer] (AASM) ? ? ?Star Age, MD, PhD ?Diplomat, American Board of Neurology and Sleep Medicine (Neurology and Sleep Medicine) ?

## 2021-11-15 NOTE — Addendum Note (Signed)
Addended by: Star Age on: 11/15/2021 02:52 PM ? ? Modules accepted: Orders ? ?

## 2021-11-18 ENCOUNTER — Other Ambulatory Visit (HOSPITAL_COMMUNITY): Payer: Self-pay

## 2021-11-18 ENCOUNTER — Telehealth: Payer: Self-pay | Admitting: *Deleted

## 2021-11-18 DIAGNOSIS — G4733 Obstructive sleep apnea (adult) (pediatric): Secondary | ICD-10-CM

## 2021-11-18 NOTE — Telephone Encounter (Signed)
Spoke to patient gave sleep study results .Did inform patient she will have to come for a repeat sleep study . Did explain to patient that Dr.Athar already put order in already and informed patient be looking out for call from sleep lab to set up appointment for repeat sleep study .   Patient expressed understanding  ?

## 2021-11-18 NOTE — Telephone Encounter (Signed)
-----   Message from Star Age, MD sent at 11/15/2021  2:52 PM EDT ----- ?Patient referred by PCP for re-eval of her OSA, seen by me on 07/31/21, diagnostic PSG on 11/07/21.   ?Please call and notify the patient that the recent sleep study showed moderate to severe obstructive sleep apnea. I recommend treatment for this in the form of CPAP. This will require a repeat sleep study for proper titration and mask fitting and correct monitoring of the oxygen saturations. Please explain to patient. I have placed an order in the chart. Thanks. ? ?Star Age, MD, PhD ?[facility]) ? ? ? ?

## 2021-11-19 ENCOUNTER — Ambulatory Visit: Payer: Medicaid Other | Admitting: Registered"

## 2021-11-21 ENCOUNTER — Ambulatory Visit (INDEPENDENT_AMBULATORY_CARE_PROVIDER_SITE_OTHER): Payer: Medicaid Other | Admitting: Bariatrics

## 2021-11-21 ENCOUNTER — Encounter (INDEPENDENT_AMBULATORY_CARE_PROVIDER_SITE_OTHER): Payer: Self-pay | Admitting: Bariatrics

## 2021-11-21 ENCOUNTER — Other Ambulatory Visit: Payer: Self-pay

## 2021-11-21 VITALS — BP 125/75 | HR 96 | Temp 98.5°F | Ht 62.0 in | Wt 195.0 lb

## 2021-11-21 DIAGNOSIS — E559 Vitamin D deficiency, unspecified: Secondary | ICD-10-CM

## 2021-11-21 DIAGNOSIS — E1169 Type 2 diabetes mellitus with other specified complication: Secondary | ICD-10-CM

## 2021-11-21 DIAGNOSIS — Z6835 Body mass index (BMI) 35.0-35.9, adult: Secondary | ICD-10-CM

## 2021-11-21 DIAGNOSIS — R9431 Abnormal electrocardiogram [ECG] [EKG]: Secondary | ICD-10-CM | POA: Insufficient documentation

## 2021-11-21 DIAGNOSIS — E7849 Other hyperlipidemia: Secondary | ICD-10-CM

## 2021-11-21 DIAGNOSIS — Z7984 Long term (current) use of oral hypoglycemic drugs: Secondary | ICD-10-CM

## 2021-11-21 DIAGNOSIS — Z9189 Other specified personal risk factors, not elsewhere classified: Secondary | ICD-10-CM

## 2021-11-21 DIAGNOSIS — E668 Other obesity: Secondary | ICD-10-CM

## 2021-11-21 DIAGNOSIS — K2970 Gastritis, unspecified, without bleeding: Secondary | ICD-10-CM

## 2021-11-21 DIAGNOSIS — R0602 Shortness of breath: Secondary | ICD-10-CM

## 2021-11-21 DIAGNOSIS — D508 Other iron deficiency anemias: Secondary | ICD-10-CM

## 2021-11-21 DIAGNOSIS — R5383 Other fatigue: Secondary | ICD-10-CM

## 2021-11-21 DIAGNOSIS — I700 Atherosclerosis of aorta: Secondary | ICD-10-CM

## 2021-11-21 DIAGNOSIS — E538 Deficiency of other specified B group vitamins: Secondary | ICD-10-CM

## 2021-11-21 DIAGNOSIS — Z1331 Encounter for screening for depression: Secondary | ICD-10-CM

## 2021-11-21 DIAGNOSIS — E739 Lactose intolerance, unspecified: Secondary | ICD-10-CM

## 2021-11-21 DIAGNOSIS — G4733 Obstructive sleep apnea (adult) (pediatric): Secondary | ICD-10-CM

## 2021-11-21 NOTE — Progress Notes (Signed)
? ? ? ?Chief Complaint:  ? ?OBESITY ?Meghan Wilson (MR# [id number]) is a 61 y.o. female who presents for evaluation and treatment of obesity and related comorbidities. Current BMI is Body mass index is 35.67 kg/m?[name] has been struggling with her weight for many years and has been unsuccessful in either losing weight, maintaining weight loss, or reaching her healthy weight goal. ? ?[name] is lactose intolerance. She states that she enjoys cooking. ? ?[name] is currently in the action stage of change and ready to dedicate time achieving and maintaining a healthier weight. [name] is interested in becoming our patient and working on intensive lifestyle modifications including (but not limited to) diet and exercise for weight loss. ? ?[name]'s habits were reviewed today and are as follows: Her family eats meals together, her desired weight loss is 65 pounds, she has been heavy most of her life, she started gaining weight when I broke my right leg, her heaviest weight ever was 207 pounds, she is a picky eater and doesn't like to eat healthier foods, she has significant food cravings issues, she snacks frequently in the evenings, she wakes up frequently in the middle of the night to eat, she skips meals frequently, she is trying to follow a vegetarian diet, she frequently makes poor food choices, she has problems with excessive hunger, she frequently eats larger portions than normal, she has binge eating behaviors, and she struggles with emotional eating. ? ?Depression Screen ?[name]'s Food and Mood (modified PHQ-9) score was 22. ? ? ?  11/21/2021  ?  8:40 AM  ?Depression screen PHQ 2/9  ?Decreased Interest 3  ?Down, Depressed, Hopeless 3  ?PHQ - 2 Score 6  ?Altered sleeping 3  ?Tired, decreased energy 3  ?Change in appetite 3  ?Feeling bad or failure about yourself  3  ?Trouble concentrating 3  ?Moving slowly or fidgety/restless 1  ?Suicidal thoughts 0  ?PHQ-9 Score 22  ?Difficult doing work/chores Somewhat difficult   ? ?Subjective:  ? ?1. Other fatigue ?Lori-Ann will continue activities. Meghan Wilson admits to daytime somnolence and admits to waking up still tired. Patient has a history of symptoms of daytime fatigue. Meghan Wilson generally gets 4 hours of sleep per night, and states that she has difficulty falling asleep. Snoring is present. Apneic episodes is present. Epworth Sleepiness Score is 19.   ? ?2. SOB (shortness of breath) on exertion ?Meghan Wilson will continue activities. Meghan Wilson notes increasing shortness of breath with exercising and seems to be worsening over time with weight gain. She notes getting out of breath sooner with activity than she used to. This has not gotten worse recently. Meghan Wilson denies shortness of breath at rest or orthopnea.  ? ?3. Type 2 diabetes mellitus with other specified complication, without long-term current use of insulin ([place]) ?Meghan Wilson is currently taking Metformin. Her fasting blood sugar was in the range of 80-120. ? ?4. Other hyperlipidemia ?Meghan Wilson is taking atorvastatin currently.  ? ?5. Peptic gastritis ?Meghan Wilson notes no progress at this time.  ? ?6. Aortic atherosclerosis ([place]) ?[name]'s diagnosis was made per her CT and abdominal US.  ? ?7. Sleep apnea, obstructive ?[name] is not using a CPAP currently. She has a recent sleep study.  ? ?8. Lactose intolerance in adult ?[name] cannot drink regular milk. ? ?9. Other iron deficiency anemia ?Meghan Wilson is taking iron currently.  ? ?10. Vitamin D deficiency ?Meghan Wilson is not on Vitamin D currently.  ? ?11. Vitamin B 12 deficiency ?Meghan Wilson is not on Vitamin B currently.  ? ?12. At  risk for  constipation ?Meghan Wilson is at risk for constipation due to dietary changes and increased protein.  ? ?Assessment/Plan:  ? ?1. Other fatigue ?[name] will gradually increase activities and exercise. We will check anemia panel and TSH today.Meghan Wilson does feel that her weight is causing her energy to be lower than it should be. Fatigue may be related to obesity, depression or many other  causes. Labs will be ordered, and in the meanwhile, Meghan Wilson will focus on self care including making healthy food choices, increasing physical activity and focusing on stress reduction.  ? ?- EKG 12-Lead ?- Anemia panel ?- TSH+T4F+T3Free ? ?2. SOB (shortness of breath) on exertion ?Meghan Wilson will gradually incrase activities and exercise. Meghan Wilson does feel that she gets out of breath more easily that she used to when she exercises. Meghan Wilson's shortness of breath appears to be obesity related and exercise induced. She has agreed to work on weight loss and gradually increase exercise to treat her exercise induced shortness of breath. Will continue to monitor closely.  ? ?- TSH+T4F+T3Free ? ?3. Type 2 diabetes mellitus with other specified complication, without long-term current use of insulin ([place]) ?Meghan Wilson will continue her medications. We will check CMP and insulin today. Good blood sugar control is important to decrease the likelihood of diabetic complications such as nephropathy, neuropathy, limb loss, blindness, coronary artery disease, and death. Intensive lifestyle modification including diet, exercise and weight loss are the first line of treatment for diabetes.  ? ?- Comprehensive metabolic panel ?- Insulin, random ? ?4. Other hyperlipidemia ?Cardiovascular risk and specific lipid/LDL goals reviewed.  Meghan Wilson will continue taking atorvastatin. We will check Lipid panel today. We discussed several lifestyle modifications today and Meghan Wilson will continue to work on diet, exercise and weight loss efforts. Orders and follow up as documented in patient record.  ? ?Counseling ?Intensive lifestyle modifications are the first line treatment for this issue. ?Dietary changes: Increase soluble fiber. Decrease simple carbohydrates. ?Exercise changes: Moderate to vigorous-intensity aerobic activity 150 minutes per week if tolerated. ?Lipid-lowering medications: see documented in medical record. ? ?- Lipid Panel With LDL/HDL  Ratio ? ?5. Peptic gastritis ?[name] will watch her triggers.  ? ?6. Aortic atherosclerosis ([place]) ?We will follow over time. We will check CMP today.  ? ?- Comprehensive metabolic panel ? ?7. Sleep apnea, obstructive ?We discussed the benefits of restful sleep. Jullie will follow up neurologist. Intensive lifestyle modifications are the first line treatment for this issue. We discussed several lifestyle modifications today and she will continue to work on diet, exercise and weight loss efforts. We will continue to monitor. Orders and follow up as documented in patient record.   ? ?8. Lactose intolerance in adult ?We discussed lactose intolerance today.  ? ?9. Other iron deficiency anemia ?Orders and follow up as documented in patient record. Collie will taking iron daily. We will check anemia panel and Ferritin today.  ? ?Counseling ?Iron is essential for our bodies to make red blood cells.  Reasons that someone may be deficient include: an iron-deficient diet (more likely in those following vegan or vegetarian diets), women with heavy menses, patients with GI disorders or poor absorption, patients that have had bariatric surgery, frequent blood donors, patients with cancer, and patients with heart disease.   ?An iron supplement has been recommended. This is found over-the-counter.  ?Iron-rich foods include dark leafy greens, red and white meats, eggs, seafood, and beans.   ?Certain foods and drinks prevent your body from absorbing iron properly. Avoid eating these foods  in the same meal as iron-rich foods or with iron supplements. These foods include: coffee, black tea, and red wine; milk, dairy products, and foods that are high in calcium; beans and soybeans; whole grains.  ?Constipation can be a side effect of iron supplementation. Increased water and fiber intake are helpful. Water goal: > 2 liters/day. Fiber goal: > 25 grams/day.  ?- Anemia panel ?- Ferritin ? ?10. Vitamin D deficiency ?Low Vitamin D level  contributes to fatigue and are associated with obesity, breast, and colon cancer. We will check Vitamin D today and Cheyne will follow-up for routine testing of Vitamin D, at least 2-3 times per year to avoid ove

## 2021-11-22 LAB — COMPREHENSIVE METABOLIC PANEL
ALT: 15 IU/L (ref 0–32)
AST: 15 IU/L (ref 0–40)
Albumin/Globulin Ratio: 1.9 (ref 1.2–2.2)
Albumin: 4.3 g/dL (ref 3.8–4.8)
Alkaline Phosphatase: 126 IU/L — ABNORMAL HIGH (ref 44–121)
BUN/Creatinine Ratio: 12 (ref 12–28)
BUN: 8 mg/dL (ref 8–27)
Bilirubin Total: 0.2 mg/dL (ref 0.0–1.2)
CO2: 24 mmol/L (ref 20–29)
Calcium: 9.6 mg/dL (ref 8.7–10.3)
Chloride: 101 mmol/L (ref 96–106)
Creatinine, Ser: 0.65 mg/dL (ref 0.57–1.00)
Globulin, Total: 2.3 g/dL (ref 1.5–4.5)
Glucose: 104 mg/dL — ABNORMAL HIGH (ref 70–99)
Potassium: 4.5 mmol/L (ref 3.5–5.2)
Sodium: 138 mmol/L (ref 134–144)
Total Protein: 6.6 g/dL (ref 6.0–8.5)
eGFR: 100 mL/min/{1.73_m2} (ref >59)

## 2021-11-22 LAB — ANEMIA PANEL
Ferritin: 184 ng/mL — ABNORMAL HIGH (ref 15–150)
Folate, Hemolysate: 449 ng/mL
Folate, RBC: 1290 ng/mL (ref >498)
Hematocrit: 34.8 % (ref 34.0–46.6)
Iron Saturation: 9 % — CL (ref 15–55)
Iron: 27 ug/dL (ref 27–139)
Retic Ct Pct: 1.7 % (ref 0.6–2.6)
Total Iron Binding Capacity: 306 ug/dL (ref 250–450)
UIBC: 279 ug/dL (ref 118–369)
Vitamin B-12: 860 pg/mL (ref 232–1245)

## 2021-11-22 LAB — LIPID PANEL WITH LDL/HDL RATIO
Cholesterol, Total: 137 mg/dL (ref 100–199)
HDL: 57 mg/dL (ref >39)
LDL Chol Calc (NIH): 63 mg/dL (ref 0–99)
LDL/HDL Ratio: 1.1 ratio (ref 0.0–3.2)
Triglycerides: 87 mg/dL (ref 0–149)
VLDL Cholesterol Cal: 17 mg/dL (ref 5–40)

## 2021-11-22 LAB — TSH+T4F+T3FREE
Free T4: 1.36 ng/dL (ref 0.82–1.77)
T3, Free: 3.3 pg/mL (ref 2.0–4.4)
TSH: 0.976 u[IU]/mL (ref 0.450–4.500)

## 2021-11-22 LAB — INSULIN, RANDOM: INSULIN: 5.8 u[IU]/mL (ref 2.6–24.9)

## 2021-11-22 LAB — VITAMIN D 25 HYDROXY (VIT D DEFICIENCY, FRACTURES): Vit D, 25-Hydroxy: 34.2 ng/mL (ref 30.0–100.0)

## 2021-11-25 NOTE — Progress Notes (Signed)
Pt advised and she is taking her iron. Referral sent

## 2021-11-26 ENCOUNTER — Encounter (INDEPENDENT_AMBULATORY_CARE_PROVIDER_SITE_OTHER): Payer: Self-pay | Admitting: Bariatrics

## 2021-11-26 ENCOUNTER — Telehealth: Payer: Self-pay | Admitting: Nurse Practitioner

## 2021-11-26 NOTE — Telephone Encounter (Signed)
Scheduled appt per 3/27 referral. Pt is aware of appt date and time. Pt is aware to arrive 15 mins prior to appt time and to bring and updated insurance card. Pt is aware of appt location.   ?

## 2021-11-27 ENCOUNTER — Other Ambulatory Visit (HOSPITAL_COMMUNITY): Payer: Self-pay

## 2021-11-27 MED ORDER — OXYCODONE HCL 5 MG PO TABS
5.0000 mg | ORAL_TABLET | Freq: Four times a day (QID) | ORAL | 0 refills | Status: DC | PRN
  Filled 2021-11-27: qty 28, 7d supply, fill #0

## 2021-12-04 ENCOUNTER — Inpatient Hospital Stay: Payer: Medicaid Other | Attending: Nurse Practitioner | Admitting: Nurse Practitioner

## 2021-12-04 ENCOUNTER — Encounter: Payer: Self-pay | Admitting: Nurse Practitioner

## 2021-12-04 ENCOUNTER — Inpatient Hospital Stay: Payer: Medicaid Other

## 2021-12-04 ENCOUNTER — Other Ambulatory Visit: Payer: Self-pay

## 2021-12-04 VITALS — BP 128/80 | HR 100 | Temp 98.6°F | Resp 17 | Wt 197.9 lb

## 2021-12-04 DIAGNOSIS — R5383 Other fatigue: Secondary | ICD-10-CM | POA: Diagnosis not present

## 2021-12-04 DIAGNOSIS — D509 Iron deficiency anemia, unspecified: Secondary | ICD-10-CM | POA: Diagnosis present

## 2021-12-04 DIAGNOSIS — I10 Essential (primary) hypertension: Secondary | ICD-10-CM | POA: Diagnosis not present

## 2021-12-04 DIAGNOSIS — Z87891 Personal history of nicotine dependence: Secondary | ICD-10-CM | POA: Insufficient documentation

## 2021-12-04 DIAGNOSIS — Z90710 Acquired absence of both cervix and uterus: Secondary | ICD-10-CM | POA: Diagnosis not present

## 2021-12-04 DIAGNOSIS — D508 Other iron deficiency anemias: Secondary | ICD-10-CM

## 2021-12-04 DIAGNOSIS — E119 Type 2 diabetes mellitus without complications: Secondary | ICD-10-CM | POA: Diagnosis not present

## 2021-12-04 LAB — FERRITIN: Ferritin: 128 ng/mL (ref 11–307)

## 2021-12-04 LAB — CBC WITH DIFFERENTIAL (CANCER CENTER ONLY)
Abs Immature Granulocytes: 0.02 10*3/uL (ref 0.00–0.07)
Basophils Absolute: 0 10*3/uL (ref 0.0–0.1)
Basophils Relative: 1 %
Eosinophils Absolute: 0.1 10*3/uL (ref 0.0–0.5)
Eosinophils Relative: 1 %
HCT: 37.2 % (ref 36.0–46.0)
Hemoglobin: 10.9 g/dL — ABNORMAL LOW (ref 12.0–15.0)
Immature Granulocytes: 0 %
Lymphocytes Relative: 31 %
Lymphs Abs: 2.4 10*3/uL (ref 0.7–4.0)
MCH: 23.8 pg — ABNORMAL LOW (ref 26.0–34.0)
MCHC: 29.3 g/dL — ABNORMAL LOW (ref 30.0–36.0)
MCV: 81.2 fL (ref 80.0–100.0)
Monocytes Absolute: 0.7 10*3/uL (ref 0.1–1.0)
Monocytes Relative: 9 %
Neutro Abs: 4.5 10*3/uL (ref 1.7–7.7)
Neutrophils Relative %: 58 %
Platelet Count: 425 10*3/uL — ABNORMAL HIGH (ref 150–400)
RBC: 4.58 MIL/uL (ref 3.87–5.11)
RDW: 15.7 % — ABNORMAL HIGH (ref 11.5–15.5)
WBC Count: 7.8 10*3/uL (ref 4.0–10.5)
nRBC: 0 % (ref 0.0–0.2)

## 2021-12-04 LAB — RETIC PANEL
Immature Retic Fract: 18.8 % — ABNORMAL HIGH (ref 2.3–15.9)
RBC.: 4.52 MIL/uL (ref 3.87–5.11)
Retic Count, Absolute: 39.3 10*3/uL (ref 19.0–186.0)
Retic Ct Pct: 0.9 % (ref 0.4–3.1)
Reticulocyte Hemoglobin: 26.4 pg — ABNORMAL LOW (ref >27.9)

## 2021-12-04 LAB — IRON AND IRON BINDING CAPACITY (CC-WL,HP ONLY)
Iron: 59 ug/dL (ref 28–170)
Saturation Ratios: 17 % (ref 10.4–31.8)
TIBC: 344 ug/dL (ref 250–450)
UIBC: 285 ug/dL (ref 148–442)
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
## 2021-12-05 ENCOUNTER — Telehealth: Payer: Self-pay | Admitting: Nurse Practitioner

## 2021-12-05 ENCOUNTER — Ambulatory Visit (INDEPENDENT_AMBULATORY_CARE_PROVIDER_SITE_OTHER): Payer: Medicaid Other | Admitting: Bariatrics

## 2021-12-05 ENCOUNTER — Encounter (INDEPENDENT_AMBULATORY_CARE_PROVIDER_SITE_OTHER): Payer: Self-pay | Admitting: Bariatrics

## 2021-12-05 ENCOUNTER — Other Ambulatory Visit (HOSPITAL_COMMUNITY): Payer: Self-pay

## 2021-12-05 VITALS — BP 124/82 | HR 91 | Temp 98.0°F | Ht 62.0 in | Wt 194.0 lb

## 2021-12-05 DIAGNOSIS — D508 Other iron deficiency anemias: Secondary | ICD-10-CM

## 2021-12-05 DIAGNOSIS — E1169 Type 2 diabetes mellitus with other specified complication: Secondary | ICD-10-CM

## 2021-12-05 DIAGNOSIS — Z7984 Long term (current) use of oral hypoglycemic drugs: Secondary | ICD-10-CM

## 2021-12-05 DIAGNOSIS — E669 Obesity, unspecified: Secondary | ICD-10-CM

## 2021-12-05 DIAGNOSIS — Z6835 Body mass index (BMI) 35.0-35.9, adult: Secondary | ICD-10-CM

## 2021-12-05 DIAGNOSIS — E559 Vitamin D deficiency, unspecified: Secondary | ICD-10-CM

## 2021-12-05 MED ORDER — VITAMIN D (ERGOCALCIFEROL) 1.25 MG (50000 UNIT) PO CAPS
50000.0000 [IU] | ORAL_CAPSULE | ORAL | 0 refills | Status: DC
  Filled 2021-12-05: qty 5, 35d supply, fill #0

## 2021-12-05 MED ORDER — OZEMPIC (0.25 OR 0.5 MG/DOSE) 2 MG/1.5ML ~~LOC~~ SOPN
0.2500 mg | PEN_INJECTOR | SUBCUTANEOUS | 0 refills | Status: DC
  Filled 2021-12-05: qty 1.5, 56d supply, fill #0

## 2021-12-05 NOTE — Telephone Encounter (Signed)
Scheduled follow-up appointments per 4/5 los. Patient is aware. ?

## 2021-12-06 ENCOUNTER — Other Ambulatory Visit: Payer: Self-pay

## 2021-12-06 ENCOUNTER — Other Ambulatory Visit (HOSPITAL_COMMUNITY): Payer: Self-pay

## 2021-12-06 NOTE — Progress Notes (Signed)
? ? ? ?Chief Complaint:  ? ?OBESITY ?Meghan Wilson is here to discuss her progress with her obesity treatment plan along with follow-up of her obesity related diagnoses. Meghan Wilson is on the Category 2 Plan and states she is following her eating plan approximately 40% of the time. Meghan Wilson states she is cardio and weights for 45 minutes 2-3 times per week. ? ?Today's visit was #: 2 ?Starting weight: 195 lbs ?Starting date: 11/21/2021 ?Today's weight: 194 lbs ?Today's date: 12/05/2021 ?Total lbs lost to date: 1 lb ?Total lbs lost since last in-office visit: 1 lb ? ?Interim History: Meghan Wilson is down 1 lb since her last visit. She is drinking adequate water.  ? ?Subjective:  ? ?1. Other iron deficiency anemia ?Meghan Wilson's iron saturation and hemoglobin and hematocrit was decreased. ? ?2. Type 2 diabetes mellitus with other specified complication, without long-term current use of insulin (Meghan Wilson) ?Meghan Wilson is currently taking Metformin. Her last A1C was 6.2. Insulin was 5.8. Her fasting blood sugar was in the range of 110-120's. ? ?3. Vitamin D deficiency ?Meghan Wilson is currently taking Vitamin D.  ? ?Assessment/Plan:  ? ?1. Other iron deficiency anemia ?Orders and follow up as documented in patient record. Referral to Hematology/Oncology on  01/02/2022 and will continue her iron supplements in the meantime.  ? ?Counseling ?Iron is essential for our bodies to make red blood cells.  Reasons that someone may be deficient include: an iron-deficient diet (more likely in those following vegan or vegetarian diets), women with heavy menses, patients with GI disorders or poor absorption, patients that have had bariatric surgery, frequent blood donors, patients with cancer, and patients with heart disease.   ?An iron supplement has been recommended. This is found over-the-counter.  ?Iron-rich foods include dark leafy greens, red and white meats, eggs, seafood, and beans.   ?Certain foods and drinks prevent your body from absorbing iron properly. Avoid eating  these foods in the same meal as iron-rich foods or with iron supplements. These foods include: coffee, black tea, and red wine; milk, dairy products, and foods that are high in calcium; beans and soybeans; whole grains.  ?Constipation can be a side effect of iron supplementation. Increased water and fiber intake are helpful. Water goal: > 2 liters/day. Fiber goal: > 25 grams/day.  ? ?2. Type 2 diabetes mellitus with other specified complication, without long-term current use of insulin (Meghan Wilson) ?Meghan Wilson will continue taking Metformin. We will refill Ozempic 0.25 mg for 1 month with no refills. Good blood sugar control is important to decrease the likelihood of diabetic complications such as nephropathy, neuropathy, limb loss, blindness, coronary artery disease, and death. Intensive lifestyle modification including diet, exercise and weight loss are the first line of treatment for diabetes.  ? ?- Semaglutide,0.25 or 0.'5MG'$ /DOS, (OZEMPIC, 0.25 OR 0.5 MG/DOSE,) 2 MG/1.5ML SOPN; Inject 0.25 mg into the skin once a week.  Dispense: 1.5 mL; Refill: 0 ? ?3. Vitamin D deficiency ?Low Vitamin D level contributes to fatigue and are associated with obesity, breast, and colon cancer. We will refill Vitamin D 50,000 IU every week for 1 month with no refills and Meghan Wilson will follow-up for routine testing of Vitamin D, at least 2-3 times per year to avoid over-replacement. ? ?- Vitamin D, Ergocalciferol, (DRISDOL) 1.25 MG (50000 UNIT) CAPS capsule; Take 1 capsule (50,000 Units total) by mouth every 7 (seven) days.  Dispense: 5 capsule; Refill: 0 ? ?4. Obesity, current BMI 35.6 ?Meghan Wilson is currently in the action stage of change. As such, her goal is to  continue with weight loss efforts. She has agreed to the Category 2 Plan and keeping a food journal and adhering to recommended goals of 1200 calories and 80-90 grams of protein.  ? ?Meghan Wilson will continue meal planning and she will intentional eating. We reviewed labs from 11/21/2021 CMP,  Lipids, anemia panel, Vitamin D, A1C, and insulin today. ? ?Exercise goals:  As is.  ? ?Behavioral modification strategies: increasing lean protein intake, decreasing simple carbohydrates, increasing vegetables, increasing water intake, decreasing eating out, no skipping meals, meal planning and cooking strategies, keeping healthy foods in the home, and planning for success. ? ?Meghan Wilson has agreed to follow-up with our clinic in 2-3 weeks with Meghan Marble, NP or Meghan Potash, PA-C and 5-6 weeks with myself. She was informed of the importance of frequent follow-up visits to maximize her success with intensive lifestyle modifications for her multiple health conditions.  ? ?Objective:  ? ?Blood pressure 124/82, pulse 91, temperature 98 ?F (36.7 ?C), height '5\' 2"'$  (1.575 m), weight 194 lb (88 kg), SpO2 99 %. ?Body mass index is 35.48 kg/m?. ? ?General: Cooperative, alert, well developed, in no acute distress. ?HEENT: Conjunctivae and lids unremarkable. ?Cardiovascular: Regular rhythm.  ?Lungs: Normal work of breathing. ?Neurologic: No focal deficits.  ? ?Lab Results  ?Component Value Date  ? CREATININE 0.65 11/21/2021  ? BUN 8 11/21/2021  ? NA 138 11/21/2021  ? K 4.5 11/21/2021  ? CL 101 11/21/2021  ? CO2 24 11/21/2021  ? ?Lab Results  ?Component Value Date  ? ALT 15 11/21/2021  ? AST 15 11/21/2021  ? ALKPHOS 126 (H) 11/21/2021  ? BILITOT 0.2 11/21/2021  ? ?Lab Results  ?Component Value Date  ? HGBA1C 6.2 (A) 08/22/2021  ? HGBA1C 6.3 (A) 05/02/2021  ? HGBA1C 5.9 (A) 10/12/2020  ? HGBA1C 6.0 (A) 05/11/2020  ? HGBA1C 6.0 (A) 11/23/2019  ? ?Lab Results  ?Component Value Date  ? INSULIN 5.8 11/21/2021  ? ?Lab Results  ?Component Value Date  ? TSH 0.976 11/21/2021  ? ?Lab Results  ?Component Value Date  ? CHOL 137 11/21/2021  ? HDL 57 11/21/2021  ? [place] 63 11/21/2021  ? TRIG 87 11/21/2021  ? CHOLHDL 2.7 05/02/2021  ? ?Lab Results  ?Component Value Date  ? VD25OH 34.2 11/21/2021  ? ?Lab Results  ?Component Value Date  ?  WBC 7.8 12/04/2021  ? HGB 10.9 (L) 12/04/2021  ? HCT 37.2 12/04/2021  ? MCV 81.2 12/04/2021  ? PLT 425 (H) 12/04/2021  ? ?Lab Results  ?Component Value Date  ? IRON 59 12/04/2021  ? TIBC 344 12/04/2021  ? FERRITIN 128 12/04/2021  ? ?Attestation Statements:  ? ?Reviewed by clinician on day of visit: allergies, medications, problem list, medical history, surgical history, family history, social history, and previous encounter notes. ? ?I, Lizbeth Bark, RMA, am acting as transcriptionist for [employer], DO. ? ?I have reviewed the above documentation for accuracy and completeness, and I agree with the above. Jearld Lesch, DO ? ?

## 2021-12-09 ENCOUNTER — Other Ambulatory Visit (HOSPITAL_COMMUNITY): Payer: Self-pay

## 2021-12-09 ENCOUNTER — Encounter (INDEPENDENT_AMBULATORY_CARE_PROVIDER_SITE_OTHER): Payer: Self-pay | Admitting: Bariatrics

## 2021-12-09 NOTE — Telephone Encounter (Signed)
LOV w/Brown

## 2021-12-10 ENCOUNTER — Other Ambulatory Visit (HOSPITAL_COMMUNITY): Payer: Self-pay

## 2021-12-10 ENCOUNTER — Other Ambulatory Visit: Payer: Self-pay | Admitting: Student

## 2021-12-10 DIAGNOSIS — E119 Type 2 diabetes mellitus without complications: Secondary | ICD-10-CM

## 2021-12-10 NOTE — Telephone Encounter (Signed)
Please review

## 2021-12-11 ENCOUNTER — Other Ambulatory Visit (HOSPITAL_COMMUNITY): Payer: Self-pay

## 2021-12-12 ENCOUNTER — Other Ambulatory Visit (HOSPITAL_COMMUNITY): Payer: Self-pay

## 2021-12-12 ENCOUNTER — Other Ambulatory Visit: Payer: Self-pay | Admitting: Internal Medicine

## 2021-12-12 DIAGNOSIS — E119 Type 2 diabetes mellitus without complications: Secondary | ICD-10-CM

## 2021-12-13 ENCOUNTER — Telehealth: Payer: Self-pay

## 2021-12-13 ENCOUNTER — Other Ambulatory Visit (HOSPITAL_COMMUNITY): Payer: Self-pay

## 2021-12-13 MED ORDER — METFORMIN HCL ER 500 MG PO TB24
500.0000 mg | ORAL_TABLET | Freq: Every day | ORAL | 3 refills | Status: DC
  Filled 2021-12-13: qty 90, 90d supply, fill #0
  Filled 2022-03-12: qty 90, 90d supply, fill #1
  Filled 2022-05-23 – 2022-05-28 (×2): qty 90, 90d supply, fill #2
  Filled 2022-05-29: qty 5, 5d supply, fill #2
  Filled 2022-06-02: qty 90, 90d supply, fill #2
  Filled 2022-09-03: qty 90, 90d supply, fill #3
  Filled ????-??-??: fill #2

## 2021-12-13 NOTE — Telephone Encounter (Signed)
This refill request came in through [place] and was sent to [facility] today at [phone]. ?

## 2021-12-13 NOTE — Telephone Encounter (Signed)
metFORMIN (GLUCOPHAGE-XR) 500 MG 24 hr tablet, refill request @ [facility]. ?

## 2021-12-16 ENCOUNTER — Other Ambulatory Visit (HOSPITAL_COMMUNITY): Payer: Self-pay

## 2021-12-18 ENCOUNTER — Encounter: Payer: Self-pay | Admitting: Internal Medicine

## 2021-12-18 ENCOUNTER — Ambulatory Visit: Payer: Medicaid Other | Admitting: Internal Medicine

## 2021-12-18 ENCOUNTER — Other Ambulatory Visit: Payer: Self-pay

## 2021-12-18 VITALS — BP 128/78 | HR 92 | Temp 97.8°F | Ht 63.0 in | Wt 200.8 lb

## 2021-12-18 DIAGNOSIS — E119 Type 2 diabetes mellitus without complications: Secondary | ICD-10-CM

## 2021-12-18 LAB — POCT GLYCOSYLATED HEMOGLOBIN (HGB A1C): Hemoglobin A1C: 5.9 % — AB (ref 4.0–5.6)

## 2021-12-18 LAB — GLUCOSE, CAPILLARY: Glucose-Capillary: 106 mg/dL — ABNORMAL HIGH (ref 70–99)

## 2021-12-18 NOTE — Patient Instructions (Signed)
Thank you, [name] for allowing [place] to provide your care today. Today we discussed diabetes, weight loss, knee replacement, constipation, anemia.   ? ?Labs/Tests Ordered: ? ?Lab Orders    ?     POC Hbg A1C     ? ?Referrals Ordered:  ?Referral Orders  ?No referral(s) requested today  ?  ? ?Medication Changes:  ?There are no discontinued medications.  ? ?No orders of the defined types were placed in this encounter. ?  ? ?Health Maintenance Screening: ?Diabetes Health Maintenance Due  ?Topic Date Due  ? HEMOGLOBIN A1C  02/20/2022  ? OPHTHALMOLOGY EXAM  06/20/2022  ? FOOT EXAM  06/26/2022  ?  ? ?Instructions:  ? ?Follow up: 6 months with Dr. Howie Ill or Dr. Coy Saunas   ? ?Remember: If you have any questions or concerns, call our clinic at [phone] or after hours call [phone] and ask for the internal medicine resident on call. ? ?Marianna Payment, D.O. ?[place] ? ? ? ?

## 2021-12-18 NOTE — Progress Notes (Signed)
? ? ?Subjective:  ?CC: DM ? ?HPI: ? ?[name] is a 61 y.o. female with a past medical history stated below and presents today for DM. Please see problem based assessment and plan for additional details. ? ?Past Medical History:  ?Diagnosis Date  ? Anemia   ? Anxiety   ? Arthritis   ? Back pain   ? Bipolar depression ([place]) 09/2015  ? Constipation   ? Depression 06/27/2021  ? Dizziness   ? GERD (gastroesophageal reflux disease)   ? History of colon polyps   ? History of kidney stones   ? History of patellar fracture 2016  ? History of tibial fracture 2016  ? Hyperlipidemia   ? IBS (irritable bowel syndrome)   ? Joint pain   ? Night terrors   ? Night terrors, adult 09/2015  ? Osteoarthritis   ? Pain of right knee after injury 03/10/2018  ? Post concussion syndrome 12/20/2019  ? Pre-diabetes   ? Seasonal allergies 11/09/2020  ? Sleep apnea   ? doesnt wear c-pap  ? Vitamin B 12 deficiency   ? Vitamin D deficiency   ? ? ?Current Outpatient Medications on File Prior to Visit  ?Medication Sig Dispense Refill  ? acetaminophen (TYLENOL) 325 MG tablet Take 3 tablets (975 mg total) by mouth every 6 (six) hours.    ? atorvastatin (LIPITOR) 80 MG tablet Take 1 tablet (80 mg total) by mouth daily. 30 tablet 11  ? celecoxib (CELEBREX) 200 MG capsule Take 1 capsule (200 mg total) by mouth daily. 30 capsule 1  ? Cyanocobalamin (B-12 PO) Take 1,000 mcg by mouth 2 (two) times daily.    ? docusate sodium (COLACE) 100 MG capsule Take 100 mg by mouth See admin instructions. Take twice a day every other day    ? Flaxseed, Linseed, (FLAX SEEDS PO) Take 500 mg by mouth 2 (two) times daily.    ? fluticasone (FLONASE) 50 MCG/ACT nasal spray Place 1 spray into both nostrils daily as needed for allergies or rhinitis 16 g 2  ? Glycerin-Hypromellose-PEG 400 (DRY EYE RELIEF DROPS) 0.2-0.2-1 % SOLN Place 1 drop into both eyes 3 (three) times daily.    ? lamoTRIgine (LAMICTAL) 100 MG tablet Take 1 tablet (100 mg total) by mouth daily. 30  tablet 3  ? linaclotide (LINZESS) 145 MCG CAPS capsule Take 1 capsule (145 mcg total) by mouth daily before breakfast. 30 capsule 3  ? metFORMIN (GLUCOPHAGE-XR) 500 MG 24 hr tablet Take 1 tablet (500 mg total) by mouth daily with breakfast. 90 tablet 3  ? methocarbamol (ROBAXIN) 500 MG tablet Take 1 tablet (500 mg total) by mouth every 6 (six) hours as needed for spasms 60 tablet 1  ? oxyCODONE (OXY IR/ROXICODONE) 5 MG immediate release tablet Take 1 tablet (5 mg total) by mouth every 6 (six) hours as needed for severe pain. 28 tablet 0  ? Peppermint Oil (IBGARD) 90 MG CPCR Take as directed    ? polyethylene glycol (MIRALAX / GLYCOLAX) 17 g packet Mix 1 packet (17 g total) in 8 oz clear liquid and drink by mouth daily as needed for mild constipation. 14 each 0  ? prazosin (MINIPRESS) 1 MG capsule Take 2 capsules by mouth at bedtime (Patient taking differently: Take 2 mg by mouth daily as needed (If can't sleep or having nightmare).) 60 capsule 3  ? pregabalin (LYRICA) 75 MG capsule Take 1 capsule (75 mg total) by mouth at bedtime as needed for nerve pain 10 capsule  0  ? pregabalin (LYRICA) 75 MG capsule Take 1 capsule (75 mg total) by mouth 2 (two) times daily. 60 capsule 0  ? Semaglutide,0.25 or 0.'5MG'$ /DOS, (OZEMPIC, 0.25 OR 0.5 MG/DOSE,) 2 MG/1.5ML SOPN Inject 0.25 mg into the skin once a week. 1.5 mL 0  ? triamcinolone cream (KENALOG) 0.1 % Apply 1 application topically daily. Apply underneath breast as needed 45 g 3  ? triamcinolone ointment (KENALOG) 0.5 % Apply 1 application topically daily. Apply to hands as needed daily 30 g 5  ? Vitamin D, Ergocalciferol, (DRISDOL) 1.25 MG (50000 UNIT) CAPS capsule Take 1 capsule (50,000 Units total) by mouth every 7 (seven) days. 5 capsule 0  ? ?No current facility-administered medications on file prior to visit.  ? ? ?Family History  ?Problem Relation Age of Onset  ? Hyperlipidemia Mother   ? Hypertension Mother   ? Colon polyps Mother   ? Heart disease Mother   ? Diabetes  Mother   ? Depression Mother   ? Anxiety disorder Mother   ? Bipolar disorder Mother   ? Eating disorder Mother   ? Obesity Mother   ? Arthritis/Rheumatoid Father   ? Hyperlipidemia Maternal Aunt   ? Hypertension Maternal Aunt   ? Cancer Maternal Aunt   ?     "blood cancer"  ? Colon polyps Maternal Grandmother   ? Stroke Other   ? Colon cancer Neg Hx   ? Esophageal cancer Neg Hx   ? Stomach cancer Neg Hx   ? Rectal cancer Neg Hx   ? Sleep apnea Neg Hx   ? ? ?Social History  ? ?Socioeconomic History  ? Marital status: Single  ?  Spouse name: Not on file  ? Number of children: 2  ? Years of education: 2 years of college  ? Highest education level: Not on file  ?Occupational History  ? Not on file  ?Tobacco Use  ? Smoking status: Former  ?  Packs/day: 0.25  ?  Years: 7.00  ?  Pack years: 1.75  ?  Types: Cigarettes  ?  Quit date: 12/21/2017  ?  Years since quitting: 3.9  ? Smokeless tobacco: Never  ?Vaping Use  ? Vaping Use: Never used  ?Substance and Sexual Activity  ? Alcohol use: Yes  ?  Comment: special occasions  ? Drug use: No  ? Sexual activity: Yes  ?  Birth control/protection: Surgical  ?  Comment: Celibate  ?Other Topics Concern  ? Not on file  ?Social History Narrative  ? Lives at home with her fiance.  ? Right-handed.  ? No daily use of caffeine.  ? ?Social Determinants of Health  ? ?Financial Resource Strain: Not on file  ?Food Insecurity: Food Insecurity Present  ? Worried About Charity fundraiser in the Last Year: Sometimes true  ? Ran Out of Food in the Last Year: Often true  ?Transportation Needs: Not on file  ?Physical Activity: Not on file  ?Stress: Not on file  ?Social Connections: Not on file  ?Intimate Partner Violence: Not on file  ? ? ?Review of Systems: ?ROS negative except for what is noted on the assessment and plan. ? ?Objective:  ? ?Vitals:  ? 12/18/21 0837  ?BP: 128/78  ?Pulse: 92  ?Temp: 97.8 ?F (36.6 ?C)  ?TempSrc: Oral  ?SpO2: 100%  ?Weight: 200 lb 12.8 oz (91.1 kg)  ?Height: '5\' 3"'$  (1.6  m)  ? ? ?Physical Exam: ?Gen: A&O x3 and in no apparent distress, well appearing  and nourished. ?CV: RRR, no murmurs, S1/S2 presents  ?Resp: Clear to ascultation bilaterally  ?MSK: Grossly normal AROM and strength x4 extremities. ?Skin: good skin turgor, no rashes, unusual bruising, or prominent lesions.  ?Neuro: No focal deficits, grossly normal sensation and coordination.  ?Psych: Oriented x3 and responding appropriately. Intact memory, normal mood, judgement, affect, and insight.  ? ? ?Assessment & Plan:  ?See Encounters Tab for problem based charting. ? ?Patient discussed with Dr.  Saverio Danker ? ? ?Marianna Payment, D.O. ?[facility]  PGY-3 ?Pager: [phone]  Phone: [phone] ?Date 12/19/2021  Time 10:21 AM ? ?

## 2021-12-19 ENCOUNTER — Encounter: Payer: Self-pay | Admitting: Internal Medicine

## 2021-12-19 NOTE — Assessment & Plan Note (Signed)
Patient presents for a follow-up appointment for her diabetes.  She states that she is tolerating her metformin well without any side effects.  She states that she has been taking part in the healthy weight and wellness program and feels like she is eating better and exercising as best as she can.  Last A1c was 6.3.  She is up-to-date on eye and foot exams. ? ? ?  Latest Ref Rng & Units 12/18/2021  ?  9:34 AM 08/22/2021  ?  9:27 AM 05/02/2021  ? 10:24 AM  ?Hemoglobin A1C  ?Hemoglobin-A1c 4.0 - 5.6 % 5.9   6.2   6.3    ? ? ?Plan: ?-Continue metformin daily ?

## 2021-12-23 NOTE — Progress Notes (Signed)
Internal Medicine Clinic Attending  Case discussed with Dr. Coe  At the time of the visit.  We reviewed the resident's history and exam and pertinent patient test results.  I agree with the assessment, diagnosis, and plan of care documented in the resident's note.  

## 2021-12-25 ENCOUNTER — Ambulatory Visit (INDEPENDENT_AMBULATORY_CARE_PROVIDER_SITE_OTHER): Payer: Medicaid Other | Admitting: Physician Assistant

## 2021-12-25 ENCOUNTER — Encounter (INDEPENDENT_AMBULATORY_CARE_PROVIDER_SITE_OTHER): Payer: Self-pay | Admitting: Physician Assistant

## 2021-12-25 ENCOUNTER — Other Ambulatory Visit (HOSPITAL_COMMUNITY): Payer: Self-pay

## 2021-12-25 VITALS — BP 113/71 | HR 91 | Temp 98.0°F | Ht 63.0 in | Wt 193.0 lb

## 2021-12-25 DIAGNOSIS — E559 Vitamin D deficiency, unspecified: Secondary | ICD-10-CM | POA: Diagnosis not present

## 2021-12-25 DIAGNOSIS — Z7985 Long-term (current) use of injectable non-insulin antidiabetic drugs: Secondary | ICD-10-CM

## 2021-12-25 DIAGNOSIS — E669 Obesity, unspecified: Secondary | ICD-10-CM | POA: Diagnosis not present

## 2021-12-25 DIAGNOSIS — Z6835 Body mass index (BMI) 35.0-35.9, adult: Secondary | ICD-10-CM

## 2021-12-25 DIAGNOSIS — E1169 Type 2 diabetes mellitus with other specified complication: Secondary | ICD-10-CM

## 2021-12-25 MED ORDER — VITAMIN D (ERGOCALCIFEROL) 1.25 MG (50000 UNIT) PO CAPS
50000.0000 [IU] | ORAL_CAPSULE | ORAL | 0 refills | Status: DC
  Filled 2021-12-25: qty 5, 35d supply, fill #0

## 2021-12-25 MED ORDER — OZEMPIC (0.25 OR 0.5 MG/DOSE) 2 MG/3ML ~~LOC~~ SOPN
0.5000 mg | PEN_INJECTOR | SUBCUTANEOUS | 0 refills | Status: DC
  Filled 2021-12-25 – 2022-01-07 (×3): qty 3, 28d supply, fill #0

## 2021-12-27 ENCOUNTER — Other Ambulatory Visit (HOSPITAL_COMMUNITY): Payer: Self-pay

## 2021-12-27 MED ORDER — LAMOTRIGINE 100 MG PO TABS
100.0000 mg | ORAL_TABLET | Freq: Every day | ORAL | 3 refills | Status: DC
  Filled 2022-02-25: qty 30, 30d supply, fill #0

## 2021-12-27 MED ORDER — PRAZOSIN HCL 1 MG PO CAPS
2.0000 mg | ORAL_CAPSULE | Freq: Every day | ORAL | 3 refills | Status: DC
  Filled 2021-12-27: qty 60, 30d supply, fill #0
  Filled 2022-03-21: qty 60, 30d supply, fill #1

## 2021-12-30 NOTE — Progress Notes (Signed)
? ? ? ?Chief Complaint:  ? ?OBESITY ?Meghan Wilson is here to discuss her progress with her obesity treatment plan along with follow-up of her obesity related diagnoses. Meghan Wilson is on the Category 2 Plan and keeping a food journal and adhering to recommended goals of 1200 calories and 80-90 grams of protein and states she is following her eating plan approximately 80% of the time. Meghan Wilson states she is doing strength training for 45-60 minutes 2-3 times per week and physical therapy for 60 minutes 1 times per week. ? ?Today's visit was #: 3 ?Starting weight: 195 lbs ?Starting date: 11/21/2021 ?Today's weight: 193 lbs ?Today's date: 12/25/2021 ?Total lbs lost to date: 2 lbs ?Total lbs lost since last in-office visit: 1 lb ? ?Interim History: Meghan Wilson is eating a lot of fruits and salads. She is drinking premier protein shakes for breakfast as she is not a breakfast eater. She is tracking her food but not tracking protein.  ? ?Subjective:  ? ?1. Type 2 diabetes mellitus with other specified complication, without long-term current use of insulin ([place]) ?Meghan Wilson started Ozempic 0.25 mg after her last visit. She is tolerating it well. Her last A1C was 5.9. She is still having cravings for sweets. She is also on Metformin once daily.  ? ?2. Vitamin D deficiency ?Meghan Wilson is currently on Vitamin D weekly and she is tolerating it well.  ? ?Assessment/Plan:  ? ?1. Type 2 diabetes mellitus with other specified complication, without long-term current use of insulin ([place]) ?Meghan Wilson agrees to increase Ozempic to 0.5 mg weekly and we will refill for 1 month with no refills. Good blood sugar control is important to decrease the likelihood of diabetic complications such as nephropathy, neuropathy, limb loss, blindness, coronary artery disease, and death. Intensive lifestyle modification including diet, exercise and weight loss are the first line of treatment for diabetes.  ? ?- Semaglutide,0.25 or 0.'5MG'$ /DOS, (OZEMPIC, 0.25 OR 0.5 MG/DOSE,) 2 MG/3ML  SOPN; Inject 0.5 mg into the skin once a week.  Dispense: 3 mL; Refill: 0 ? ?2. Vitamin D deficiency ?Low Vitamin D level contributes to fatigue and are associated with obesity, breast, and colon cancer. We will refill prescription Vitamin D 50,000 IU every week for 1 month with no refills and Meghan Wilson will follow-up for routine testing of Vitamin D, at least 2-3 times per year to avoid over-replacement. ? ?- Vitamin D, Ergocalciferol, (DRISDOL) 1.25 MG (50000 UNIT) CAPS capsule; Take 1 capsule (50,000 Units total) by mouth every 7 (seven) days.  Dispense: 5 capsule; Refill: 0 ? ?3. Obesity, current BMI 35.6 ?Meghan Wilson is currently in the action stage of change. As such, her goal is to continue with weight loss efforts. She has agreed to keeping a food journal and adhering to recommended goals of 1200 calories and 80 grams of protein daily.  ? ?Exercise goals:  As is.  ? ?Behavioral modification strategies: planning for success and keeping a strict food journal. ? ?Meghan Wilson has agreed to follow-up with our clinic in 4 weeks. She was informed of the importance of frequent follow-up visits to maximize her success with intensive lifestyle modifications for her multiple health conditions.  ? ?Objective:  ? ?Blood pressure 113/71, pulse 91, temperature 98 ?F (36.7 ?C), height '5\' 3"'$  (1.6 m), weight 193 lb (87.5 kg), SpO2 100 %. ?Body mass index is 34.19 kg/m?. ? ?General: Cooperative, alert, well developed, in no acute distress. ?HEENT: Conjunctivae and lids unremarkable. ?Cardiovascular: Regular rhythm.  ?Lungs: Normal work of breathing. ?Neurologic: No focal deficits.  ? ?  Lab Results  ?Component Value Date  ? CREATININE 0.65 11/21/2021  ? BUN 8 11/21/2021  ? NA 138 11/21/2021  ? K 4.5 11/21/2021  ? CL 101 11/21/2021  ? CO2 24 11/21/2021  ? ?Lab Results  ?Component Value Date  ? ALT 15 11/21/2021  ? AST 15 11/21/2021  ? ALKPHOS 126 (H) 11/21/2021  ? BILITOT 0.2 11/21/2021  ? ?Lab Results  ?Component Value Date  ? HGBA1C 5.9 (A)  12/18/2021  ? HGBA1C 6.2 (A) 08/22/2021  ? HGBA1C 6.3 (A) 05/02/2021  ? HGBA1C 5.9 (A) 10/12/2020  ? HGBA1C 6.0 (A) 05/11/2020  ? ?Lab Results  ?Component Value Date  ? INSULIN 5.8 11/21/2021  ? ?Lab Results  ?Component Value Date  ? TSH 0.976 11/21/2021  ? ?Lab Results  ?Component Value Date  ? CHOL 137 11/21/2021  ? HDL 57 11/21/2021  ? [place] 63 11/21/2021  ? TRIG 87 11/21/2021  ? CHOLHDL 2.7 05/02/2021  ? ?Lab Results  ?Component Value Date  ? VD25OH 34.2 11/21/2021  ? ?Lab Results  ?Component Value Date  ? WBC 7.8 12/04/2021  ? HGB 10.9 (L) 12/04/2021  ? HCT 37.2 12/04/2021  ? MCV 81.2 12/04/2021  ? PLT 425 (H) 12/04/2021  ? ?Lab Results  ?Component Value Date  ? IRON 59 12/04/2021  ? TIBC 344 12/04/2021  ? FERRITIN 128 12/04/2021  ? ?Attestation Statements:  ? ?Reviewed by clinician on day of visit: allergies, medications, problem list, medical history, surgical history, family history, social history, and previous encounter notes. ? ?I, Tonye Pearson, am acting as Location manager for [employer], PA-C. ? ?I have reviewed the above documentation for accuracy and completeness, and I agree with the above. Abby Potash, PA-C ? ?

## 2022-01-06 ENCOUNTER — Other Ambulatory Visit (HOSPITAL_COMMUNITY): Payer: Self-pay

## 2022-01-06 ENCOUNTER — Telehealth (INDEPENDENT_AMBULATORY_CARE_PROVIDER_SITE_OTHER): Payer: Self-pay | Admitting: Bariatrics

## 2022-01-06 NOTE — Telephone Encounter (Signed)
Patient requesting refill on needles. ?

## 2022-01-07 ENCOUNTER — Other Ambulatory Visit (HOSPITAL_COMMUNITY): Payer: Self-pay

## 2022-01-07 NOTE — Telephone Encounter (Signed)
Spoke to patient and patient stated she never started the prescription from [facility] on 12/25/21. Called the pharmacy for patient and they stated they can get her prescription ready for her. Notified patient of pharmacy response. Patient verbalized understanding.  ?

## 2022-01-07 NOTE — Telephone Encounter (Signed)
LAST APPOINTMENT DATE: 12/25/21 ?NEXT APPOINTMENT DATE: 01/28/22 ? ? ?[facility] ?[address] ?[place] [address] ?Phone: [phone] Fax: [phone] ? ?Patient is requesting a refill of the following medications: ?Requested Prescriptions  ? ? No prescriptions requested or ordered in this encounter  ? ? ?Date last filled: ? ?Previously prescribed by Dr.Brown ? ?Lab Results  ?Component Value Date  ? HGBA1C 5.9 (A) 12/18/2021  ? HGBA1C 6.2 (A) 08/22/2021  ? HGBA1C 6.3 (A) 05/02/2021  ? ?Lab Results  ?Component Value Date  ? [place] 63 11/21/2021  ? CREATININE 0.65 11/21/2021  ? ?Lab Results  ?Component Value Date  ? VD25OH 34.2 11/21/2021  ? ? ?BP Readings from Last 3 Encounters:  ?12/25/21 113/71  ?12/18/21 128/78  ?12/05/21 124/82  ?  ?

## 2022-01-08 ENCOUNTER — Other Ambulatory Visit (HOSPITAL_COMMUNITY): Payer: Self-pay

## 2022-01-13 ENCOUNTER — Other Ambulatory Visit (HOSPITAL_COMMUNITY): Payer: Self-pay

## 2022-01-13 MED ORDER — UNIFINE PENTIPS 32G X 4 MM MISC
0 refills | Status: DC
  Filled 2022-01-13: qty 100, 50d supply, fill #0

## 2022-01-13 MED ORDER — OZEMPIC (0.25 OR 0.5 MG/DOSE) 2 MG/3ML ~~LOC~~ SOPN
0.5000 mg | PEN_INJECTOR | SUBCUTANEOUS | 0 refills | Status: DC
  Filled 2022-01-13: qty 3, 28d supply, fill #0

## 2022-01-14 ENCOUNTER — Other Ambulatory Visit (HOSPITAL_COMMUNITY): Payer: Self-pay

## 2022-01-20 ENCOUNTER — Other Ambulatory Visit (HOSPITAL_COMMUNITY): Payer: Self-pay

## 2022-01-20 MED ORDER — AMOXICILLIN 500 MG PO CAPS
2000.0000 mg | ORAL_CAPSULE | ORAL | 3 refills | Status: DC
  Filled 2022-01-20: qty 4, 1d supply, fill #0

## 2022-01-22 ENCOUNTER — Other Ambulatory Visit (HOSPITAL_COMMUNITY): Payer: Self-pay

## 2022-01-23 NOTE — Telephone Encounter (Signed)
LVM for patient to call me back to discuss further information about autopap. Informed patient phone lines close at [place] and after today office will be closed and reopen  Tuesday

## 2022-01-23 NOTE — Telephone Encounter (Signed)
Pt's insurance is recommending pt to be set up on AutoPAP first. Then if pt is noncompliant or having difficulties, we can bring back in for a titration sleep study.

## 2022-01-23 NOTE — Addendum Note (Signed)
Addended by: Star Age on: 01/23/2022 03:53 PM   Modules accepted: Orders

## 2022-01-23 NOTE — Telephone Encounter (Signed)
We will set patient up with autoPAP at home, as insurance denied in house titration study for OSA. Pls process order and notify patient and set up FU in 10 weeks with me or NP.

## 2022-01-28 ENCOUNTER — Encounter: Payer: Self-pay | Admitting: *Deleted

## 2022-01-28 ENCOUNTER — Ambulatory Visit (INDEPENDENT_AMBULATORY_CARE_PROVIDER_SITE_OTHER): Payer: Medicaid Other | Admitting: Bariatrics

## 2022-01-28 ENCOUNTER — Encounter (INDEPENDENT_AMBULATORY_CARE_PROVIDER_SITE_OTHER): Payer: Self-pay | Admitting: Bariatrics

## 2022-01-28 ENCOUNTER — Telehealth: Payer: Self-pay

## 2022-01-28 ENCOUNTER — Other Ambulatory Visit (HOSPITAL_COMMUNITY): Payer: Self-pay

## 2022-01-28 VITALS — BP 114/79 | HR 83 | Temp 97.8°F | Ht 63.0 in | Wt 191.0 lb

## 2022-01-28 DIAGNOSIS — E1169 Type 2 diabetes mellitus with other specified complication: Secondary | ICD-10-CM

## 2022-01-28 DIAGNOSIS — E559 Vitamin D deficiency, unspecified: Secondary | ICD-10-CM | POA: Diagnosis not present

## 2022-01-28 DIAGNOSIS — Z6833 Body mass index (BMI) 33.0-33.9, adult: Secondary | ICD-10-CM | POA: Diagnosis not present

## 2022-01-28 DIAGNOSIS — E669 Obesity, unspecified: Secondary | ICD-10-CM

## 2022-01-28 DIAGNOSIS — Z7985 Long-term (current) use of injectable non-insulin antidiabetic drugs: Secondary | ICD-10-CM

## 2022-01-28 MED ORDER — OZEMPIC (0.25 OR 0.5 MG/DOSE) 2 MG/3ML ~~LOC~~ SOPN
0.5000 mg | PEN_INJECTOR | SUBCUTANEOUS | 0 refills | Status: DC
  Filled 2022-01-28: qty 3, 28d supply, fill #0

## 2022-01-28 MED ORDER — VITAMIN D (ERGOCALCIFEROL) 1.25 MG (50000 UNIT) PO CAPS
50000.0000 [IU] | ORAL_CAPSULE | ORAL | 0 refills | Status: DC
  Filled 2022-01-28: qty 5, 35d supply, fill #0

## 2022-01-28 NOTE — Telephone Encounter (Signed)
I called pt and relayed that per insurance she needs to try autopap first.  Order will be sent to [facility] she has medicaid. She is concerned about using machine since she is claustrophobic , but nasal or oro-nasal mask ordered and she will need to try.  Pt ok to proceed. Orders sent to [facility] (fax confirmation received).  Made appt for her 03-19-2022 at 0900 for initial cpap f/u with MM/NP.  She verbalized understanding to use 4 hours at least more if she can for insurance compliance.

## 2022-01-29 ENCOUNTER — Other Ambulatory Visit (HOSPITAL_COMMUNITY): Payer: Self-pay

## 2022-01-30 NOTE — Progress Notes (Signed)
Chief Complaint:   OBESITY Meghan Wilson is here to discuss her progress with her obesity treatment plan along with follow-up of her obesity related diagnoses. Meghan Wilson is on keeping a food journal and adhering to recommended goals of 1200 calories and 80 grams of  protein and states she is following her eating plan approximately 90% of the time. Meghan Wilson states she is doing physical therapy for 60 minutes 1 times per week.  Today's visit was #: 4 Starting weight: 195 lbs Starting date: 11/21/2021 Today's weight: 191 lbs Today's date: 01/28/2022 Total lbs lost to date: 4 lbs Total lbs lost since last in-office visit: 2 lbs  Interim History: Meghan Wilson is down 2 lbs since her last visit. She is eating better and more bloated with protein.   Subjective:   1. Type 2 diabetes mellitus with other specified complication, without long-term current use of insulin (HCC) Meghan Wilson is taking Ozempic currently.   2. Vitamin D deficiency Meghan Wilson is taking Vitamin D as directed.   Assessment/Plan:   1. Type 2 diabetes mellitus with other specified complication, without long-term current use of insulin (HCC) We will refill Ozempic for 1 month with no refills. Good blood sugar control is important to decrease the likelihood of diabetic complications such as nephropathy, neuropathy, limb loss, blindness, coronary artery disease, and death. Intensive lifestyle modification including diet, exercise and weight loss are the first line of treatment for diabetes.   - Semaglutide,0.25 or 0.'5MG'$ /DOS, (OZEMPIC, 0.25 OR 0.5 MG/DOSE,) 2 MG/3ML SOPN; Inject 0.5 mg into the skin once a week.  Dispense: 3 mL; Refill: 0  2. Vitamin D deficiency Low Vitamin D level contributes to fatigue and are associated with obesity, breast, and colon cancer. We will refill prescription Vitamin D 50,000 IU every week for 1 month with no refills and Meghan Wilson will follow-up for routine testing of Vitamin D, at least 2-3 times per year to avoid  over-replacement.  - Vitamin D, Ergocalciferol, (DRISDOL) 1.25 MG (50000 UNIT) CAPS capsule; Take 1 capsule (50,000 Units total) by mouth every 7 (seven) days.  Dispense: 5 capsule; Refill: 0  3. Obesity, Current BMI 33.9 Meghan Wilson is currently in the action stage of change. As such, her goal is to continue with weight loss efforts. She has agreed to keeping a food journal and adhering to recommended goals of 1200 calories and 80 grams of protein.   Meghan Wilson will continue to follow the plan closely 80-90%. She will be mindful eating. She will have no fried foods. Dining Out Guide was provided today.   Exercise goals:  Meghan Wilson will start back to the gym.   Behavioral modification strategies: increasing lean protein intake, decreasing simple carbohydrates, increasing vegetables, increasing water intake, decreasing eating out, no skipping meals, meal planning and cooking strategies, keeping healthy foods in the home, and planning for success.  Meghan Wilson has agreed to follow-up with our clinic in 2-3 weeks. She was informed of the importance of frequent follow-up visits to maximize her success with intensive lifestyle modifications for her multiple health conditions.   Objective:   Blood pressure 114/79, pulse 83, temperature 97.8 F (36.6 C), height '5\' 3"'$  (1.6 m), weight 191 lb (86.6 kg), SpO2 97 %. Body mass index is 33.83 kg/m.  General: Cooperative, alert, well developed, in no acute distress. HEENT: Conjunctivae and lids unremarkable. Cardiovascular: Regular rhythm.  Lungs: Normal work of breathing. Neurologic: No focal deficits.   Lab Results  Component Value Date   CREATININE 0.65 11/21/2021   BUN 8  11/21/2021   NA 138 11/21/2021   K 4.5 11/21/2021   CL 101 11/21/2021   CO2 24 11/21/2021   Lab Results  Component Value Date   ALT 15 11/21/2021   AST 15 11/21/2021   ALKPHOS 126 (H) 11/21/2021   BILITOT 0.2 11/21/2021   Lab Results  Component Value Date   HGBA1C 5.9 (A)  12/18/2021   HGBA1C 6.2 (A) 08/22/2021   HGBA1C 6.3 (A) 05/02/2021   HGBA1C 5.9 (A) 10/12/2020   HGBA1C 6.0 (A) 05/11/2020   Lab Results  Component Value Date   INSULIN 5.8 11/21/2021   Lab Results  Component Value Date   TSH 0.976 11/21/2021   Lab Results  Component Value Date   CHOL 137 11/21/2021   HDL 57 11/21/2021   LDLCALC 63 11/21/2021   TRIG 87 11/21/2021   CHOLHDL 2.7 05/02/2021   Lab Results  Component Value Date   VD25OH 34.2 11/21/2021   Lab Results  Component Value Date   WBC 7.8 12/04/2021   HGB 10.9 (L) 12/04/2021   HCT 37.2 12/04/2021   MCV 81.2 12/04/2021   PLT 425 (H) 12/04/2021   Lab Results  Component Value Date   IRON 59 12/04/2021   TIBC 344 12/04/2021   FERRITIN 128 12/04/2021   Attestation Statements:   Reviewed by clinician on day of visit: allergies, medications, problem list, medical history, surgical history, family history, social history, and previous encounter notes.  I, Lizbeth Bark, RMA, am acting as Location manager for [employer], DO.  I have reviewed the above documentation for accuracy and completeness, and I agree with the above. Jearld Lesch, DO

## 2022-01-31 ENCOUNTER — Other Ambulatory Visit: Payer: Self-pay | Admitting: Internal Medicine

## 2022-01-31 ENCOUNTER — Other Ambulatory Visit (HOSPITAL_COMMUNITY): Payer: Self-pay

## 2022-01-31 ENCOUNTER — Telehealth: Payer: Self-pay

## 2022-01-31 DIAGNOSIS — E119 Type 2 diabetes mellitus without complications: Secondary | ICD-10-CM

## 2022-01-31 NOTE — Telephone Encounter (Signed)
Insulin Pen Needle  (UNIFINE PENTIPS)   Pt is requesting test strips and lancets  for her meter ( ACCU chk guide me )  she stated that donna usually write to RX for her .. because it is not on her med list   [facility]  [address], [place] [address]  Phone:  [phone]  Fax:  [phone]

## 2022-01-31 NOTE — Telephone Encounter (Signed)
Call from patient requesting AccuCheck Lancets and Test Strips

## 2022-02-01 MED ORDER — ACCU-CHEK FASTCLIX LANCETS MISC
1.0000 | 3 refills | Status: DC
  Filled 2022-02-01: qty 102, 102d supply, fill #0
  Filled 2023-01-05: qty 102, 102d supply, fill #1

## 2022-02-01 MED ORDER — ACCU-CHEK GUIDE VI STRP
ORAL_STRIP | 3 refills | Status: DC
  Filled 2022-02-01: qty 100, 100d supply, fill #0
  Filled 2023-01-05: qty 100, 100d supply, fill #1

## 2022-02-03 ENCOUNTER — Other Ambulatory Visit (HOSPITAL_COMMUNITY): Payer: Self-pay

## 2022-02-03 NOTE — Telephone Encounter (Addendum)
The diabetes testing supplies appear to have been ordered.

## 2022-02-04 ENCOUNTER — Encounter (INDEPENDENT_AMBULATORY_CARE_PROVIDER_SITE_OTHER): Payer: Self-pay | Admitting: Bariatrics

## 2022-02-13 ENCOUNTER — Ambulatory Visit (INDEPENDENT_AMBULATORY_CARE_PROVIDER_SITE_OTHER): Payer: Medicaid Other | Admitting: Bariatrics

## 2022-02-17 ENCOUNTER — Ambulatory Visit (INDEPENDENT_AMBULATORY_CARE_PROVIDER_SITE_OTHER): Payer: Medicaid Other | Admitting: Nurse Practitioner

## 2022-02-17 ENCOUNTER — Other Ambulatory Visit (HOSPITAL_COMMUNITY): Payer: Self-pay

## 2022-02-17 ENCOUNTER — Encounter (INDEPENDENT_AMBULATORY_CARE_PROVIDER_SITE_OTHER): Payer: Self-pay | Admitting: Nurse Practitioner

## 2022-02-17 VITALS — BP 135/76 | HR 59 | Temp 97.9°F | Ht 63.0 in | Wt 189.0 lb

## 2022-02-17 DIAGNOSIS — Z6833 Body mass index (BMI) 33.0-33.9, adult: Secondary | ICD-10-CM

## 2022-02-17 DIAGNOSIS — E559 Vitamin D deficiency, unspecified: Secondary | ICD-10-CM

## 2022-02-17 DIAGNOSIS — E1169 Type 2 diabetes mellitus with other specified complication: Secondary | ICD-10-CM

## 2022-02-17 DIAGNOSIS — D508 Other iron deficiency anemias: Secondary | ICD-10-CM

## 2022-02-17 DIAGNOSIS — Z7985 Long-term (current) use of injectable non-insulin antidiabetic drugs: Secondary | ICD-10-CM

## 2022-02-17 DIAGNOSIS — E669 Obesity, unspecified: Secondary | ICD-10-CM

## 2022-02-17 DIAGNOSIS — M25552 Pain in left hip: Secondary | ICD-10-CM | POA: Insufficient documentation

## 2022-02-17 MED ORDER — VITAMIN D (ERGOCALCIFEROL) 1.25 MG (50000 UNIT) PO CAPS
50000.0000 [IU] | ORAL_CAPSULE | ORAL | 0 refills | Status: DC
  Filled 2022-02-17 – 2022-02-24 (×3): qty 5, 35d supply, fill #0

## 2022-02-18 NOTE — Progress Notes (Signed)
Chief Complaint:   OBESITY Meghan Wilson is here to discuss her progress with her obesity treatment plan along with follow-up of her obesity related diagnoses. Meghan Wilson is on the Category 2 Plan and states she is following her eating plan approximately 80% of the time. Meghan Wilson states she is going to the gym, treadmill/stationary bike 60 minutes 2-3 times per week.  Today's visit was #: 5 Starting weight: 195 lbs Starting date: 11/21/2021 Today's weight: 189 lbs Today's date: 02/17/2022 Total lbs lost to date: 6 lbs Total lbs lost since last in-office visit: 2  Interim History: Meghan Wilson has done well with her weight loss since last visit, she has had some celebrations since then. She is following Cat 2 meal plan and journaling. She loves fruit and carrots but craving sweets. She tries to eat food that have no sugar, but struggling with hunger. She does not like to eat breakfast, as she will drink a protein shake around 11. She is not weighing her protein.    Subjective:   1. Type 2 diabetes mellitus with other specified complication, without long-term current use of insulin (HCC) Meghan Wilson is taking Ozempic 0.5 mg. Denies any side effects. She continues to struggle with hunger and cravings. Her fasting blood sugars are 99-119. Reports possible hypoglycemia when she skips breakfast.   2. Vitamin D deficiency Meghan Wilson is taking Vit D 50,000 IU weekly. Denies any nausea, vomiting or muscle weakness.  3. Other iron deficiency anemia Meghan Wilson saw hematology 4/5 and has a follow up appointment scheduled in July.  Assessment/Plan:   1. Type 2 diabetes mellitus with other specified complication, without long-term current use of insulin ([place]) Meghan Wilson will continue with Ozempic. Side effects discussed. Will not increase dose due to skipping breakfast.  Good blood sugar control is important to decrease the likelihood of diabetic complications such as nephropathy, neuropathy, limb loss, blindness, coronary  artery disease, and death. Intensive lifestyle modification including diet, exercise and weight loss are the first line of treatment for diabetes.    2. Vitamin D deficiency We will refill Vit D 50,000 IU once a week for 1 month with 0 refills.  Low Vitamin D level contributes to fatigue and are associated with obesity, breast, and colon cancer. She agrees to continue to take prescription Vitamin D '@50'$ ,000 IU every week and will follow-up for routine testing of Vitamin D, at least 2-3 times per year to avoid over-replacement.   Refill Vitamin D, Ergocalciferol, (DRISDOL) 1.25 MG (50000 UNIT) CAPS capsule; Take 1 capsule (50,000 Units total) by mouth every 7 (seven) days.  Dispense: 5 capsule; Refill: 0  3. Other iron deficiency anemia Keep follow up appointment with Hematology.  4. Obesity, Current BMI 33.5 Meghan Wilson is currently in the action stage of change. As such, her goal is to continue with weight loss efforts. She has agreed to the Category 2 Plan.   Handout given: Smart fruit choices.  Exercise goals: As is.   Behavioral modification strategies: increasing lean protein intake, increasing water intake, and planning for success.  Meghan Wilson has agreed to follow-up with our clinic in 3 weeks. She was informed of the importance of frequent follow-up visits to maximize her success with intensive lifestyle modifications for her multiple health conditions.   Objective:   Blood pressure 135/76, pulse (!) 59, temperature 97.9 F (36.6 C), height '5\' 3"'$  (1.6 m), weight 189 lb (85.7 kg), SpO2 99 %. Body mass index is 33.48 kg/m.  General: Cooperative, alert, well developed, in no acute  distress. HEENT: Conjunctivae and lids unremarkable. Cardiovascular: Regular rhythm.  Lungs: Normal work of breathing. Neurologic: No focal deficits.   Lab Results  Component Value Date   CREATININE 0.65 11/21/2021   BUN 8 11/21/2021   NA 138 11/21/2021   K 4.5 11/21/2021   CL 101 11/21/2021   CO2 24  11/21/2021   Lab Results  Component Value Date   ALT 15 11/21/2021   AST 15 11/21/2021   ALKPHOS 126 (H) 11/21/2021   BILITOT 0.2 11/21/2021   Lab Results  Component Value Date   HGBA1C 5.9 (A) 12/18/2021   HGBA1C 6.2 (A) 08/22/2021   HGBA1C 6.3 (A) 05/02/2021   HGBA1C 5.9 (A) 10/12/2020   HGBA1C 6.0 (A) 05/11/2020   Lab Results  Component Value Date   INSULIN 5.8 11/21/2021   Lab Results  Component Value Date   TSH 0.976 11/21/2021   Lab Results  Component Value Date   CHOL 137 11/21/2021   HDL 57 11/21/2021   LDLCALC 63 11/21/2021   TRIG 87 11/21/2021   CHOLHDL 2.7 05/02/2021   Lab Results  Component Value Date   VD25OH 34.2 11/21/2021   Lab Results  Component Value Date   WBC 7.8 12/04/2021   HGB 10.9 (L) 12/04/2021   HCT 37.2 12/04/2021   MCV 81.2 12/04/2021   PLT 425 (H) 12/04/2021   Lab Results  Component Value Date   IRON 59 12/04/2021   TIBC 344 12/04/2021   FERRITIN 128 12/04/2021   Attestation Statements:   Reviewed by clinician on day of visit: allergies, medications, problem list, medical history, surgical history, family history, social history, and previous encounter notes.  I, Brendell Tyus, RMA, am acting as transcriptionist for Everardo Pacific, FNP..  I have reviewed the above documentation for accuracy and completeness, and I agree with the above. Everardo Pacific, FNP

## 2022-02-20 ENCOUNTER — Other Ambulatory Visit (HOSPITAL_COMMUNITY): Payer: Self-pay

## 2022-02-24 ENCOUNTER — Other Ambulatory Visit (HOSPITAL_COMMUNITY): Payer: Self-pay

## 2022-02-25 ENCOUNTER — Other Ambulatory Visit (HOSPITAL_COMMUNITY): Payer: Self-pay

## 2022-02-25 ENCOUNTER — Telehealth: Payer: Self-pay | Admitting: Neurology

## 2022-02-25 ENCOUNTER — Other Ambulatory Visit: Payer: Self-pay | Admitting: Gastroenterology

## 2022-02-25 MED ORDER — LINACLOTIDE 145 MCG PO CAPS
145.0000 ug | ORAL_CAPSULE | Freq: Every day | ORAL | 3 refills | Status: DC
Start: 2022-02-25 — End: 2022-07-28
  Filled 2022-02-25: qty 30, 30d supply, fill #0
  Filled 2022-04-04: qty 30, 30d supply, fill #1
  Filled 2022-05-09: qty 30, 30d supply, fill #2
  Filled 2022-06-09: qty 30, 30d supply, fill #3

## 2022-02-27 ENCOUNTER — Other Ambulatory Visit (HOSPITAL_COMMUNITY): Payer: Self-pay

## 2022-02-27 MED ORDER — PREDNISONE 5 MG PO TABS
ORAL_TABLET | ORAL | 0 refills | Status: DC
  Filled 2022-02-27: qty 48, 12d supply, fill #0

## 2022-02-27 MED ORDER — METHOCARBAMOL 500 MG PO TABS
500.0000 mg | ORAL_TABLET | Freq: Four times a day (QID) | ORAL | 1 refills | Status: DC | PRN
  Filled 2022-02-27: qty 60, 15d supply, fill #0
  Filled 2022-05-01: qty 25, 7d supply, fill #1
  Filled 2022-05-01 (×2): qty 60, 15d supply, fill #1
  Filled 2022-05-02: qty 35, 8d supply, fill #1

## 2022-03-05 ENCOUNTER — Inpatient Hospital Stay: Payer: Medicaid Other | Attending: Nurse Practitioner

## 2022-03-05 ENCOUNTER — Other Ambulatory Visit: Payer: Self-pay

## 2022-03-05 DIAGNOSIS — Z79899 Other long term (current) drug therapy: Secondary | ICD-10-CM | POA: Diagnosis not present

## 2022-03-05 DIAGNOSIS — G4730 Sleep apnea, unspecified: Secondary | ICD-10-CM | POA: Insufficient documentation

## 2022-03-05 DIAGNOSIS — D509 Iron deficiency anemia, unspecified: Secondary | ICD-10-CM | POA: Insufficient documentation

## 2022-03-05 DIAGNOSIS — F319 Bipolar disorder, unspecified: Secondary | ICD-10-CM | POA: Diagnosis not present

## 2022-03-05 DIAGNOSIS — K219 Gastro-esophageal reflux disease without esophagitis: Secondary | ICD-10-CM | POA: Insufficient documentation

## 2022-03-05 DIAGNOSIS — D508 Other iron deficiency anemias: Secondary | ICD-10-CM

## 2022-03-05 DIAGNOSIS — Z7984 Long term (current) use of oral hypoglycemic drugs: Secondary | ICD-10-CM | POA: Diagnosis not present

## 2022-03-05 DIAGNOSIS — K589 Irritable bowel syndrome without diarrhea: Secondary | ICD-10-CM | POA: Diagnosis not present

## 2022-03-05 DIAGNOSIS — Z7952 Long term (current) use of systemic steroids: Secondary | ICD-10-CM | POA: Insufficient documentation

## 2022-03-05 DIAGNOSIS — E785 Hyperlipidemia, unspecified: Secondary | ICD-10-CM | POA: Diagnosis not present

## 2022-03-05 LAB — CBC WITH DIFFERENTIAL (CANCER CENTER ONLY)
Abs Immature Granulocytes: 0.04 10*3/uL (ref 0.00–0.07)
Basophils Absolute: 0 10*3/uL (ref 0.0–0.1)
Basophils Relative: 0 %
Eosinophils Absolute: 0 10*3/uL (ref 0.0–0.5)
Eosinophils Relative: 0 %
HCT: 41 % (ref 36.0–46.0)
Hemoglobin: 13.1 g/dL (ref 12.0–15.0)
Immature Granulocytes: 0 %
Lymphocytes Relative: 17 %
Lymphs Abs: 2 10*3/uL (ref 0.7–4.0)
MCH: 24.1 pg — ABNORMAL LOW (ref 26.0–34.0)
MCHC: 32 g/dL (ref 30.0–36.0)
MCV: 75.5 fL — ABNORMAL LOW (ref 80.0–100.0)
Monocytes Absolute: 0.7 10*3/uL (ref 0.1–1.0)
Monocytes Relative: 6 %
Neutro Abs: 9.4 10*3/uL — ABNORMAL HIGH (ref 1.7–7.7)
Neutrophils Relative %: 77 %
Platelet Count: 383 10*3/uL (ref 150–400)
RBC: 5.43 MIL/uL — ABNORMAL HIGH (ref 3.87–5.11)
RDW: 19.5 % — ABNORMAL HIGH (ref 11.5–15.5)
WBC Count: 12.2 10*3/uL — ABNORMAL HIGH (ref 4.0–10.5)
nRBC: 0 % (ref 0.0–0.2)

## 2022-03-05 LAB — IRON AND IRON BINDING CAPACITY (CC-WL,HP ONLY)
Iron: 81 ug/dL (ref 28–170)
Saturation Ratios: 21 % (ref 10.4–31.8)
TIBC: 392 ug/dL (ref 250–450)
UIBC: 311 ug/dL (ref 148–442)

## 2022-03-05 LAB — FERRITIN: Ferritin: 68 ng/mL (ref 11–307)

## 2022-03-06 ENCOUNTER — Other Ambulatory Visit (HOSPITAL_COMMUNITY): Payer: Self-pay

## 2022-03-06 ENCOUNTER — Ambulatory Visit (INDEPENDENT_AMBULATORY_CARE_PROVIDER_SITE_OTHER): Payer: Medicaid Other | Admitting: Nurse Practitioner

## 2022-03-06 VITALS — BP 133/79 | HR 98 | Temp 98.2°F | Ht 63.0 in | Wt 187.0 lb

## 2022-03-06 DIAGNOSIS — Z7985 Long-term (current) use of injectable non-insulin antidiabetic drugs: Secondary | ICD-10-CM

## 2022-03-06 DIAGNOSIS — Z6833 Body mass index (BMI) 33.0-33.9, adult: Secondary | ICD-10-CM | POA: Diagnosis not present

## 2022-03-06 DIAGNOSIS — D508 Other iron deficiency anemias: Secondary | ICD-10-CM

## 2022-03-06 DIAGNOSIS — E669 Obesity, unspecified: Secondary | ICD-10-CM

## 2022-03-06 DIAGNOSIS — E1169 Type 2 diabetes mellitus with other specified complication: Secondary | ICD-10-CM

## 2022-03-06 MED ORDER — OZEMPIC (0.25 OR 0.5 MG/DOSE) 2 MG/3ML ~~LOC~~ SOPN
0.5000 mg | PEN_INJECTOR | SUBCUTANEOUS | 0 refills | Status: DC
  Filled 2022-03-06: qty 3, 28d supply, fill #0

## 2022-03-09 NOTE — Progress Notes (Unsigned)
[facility]   [phone] [phone]   Clinic Follow up Note   Patient Care Team: Idamae Schuller, MD as PCP - General 03/09/2022  CHIEF COMPLAINT: Follow up IDA  CURRENT THERAPY: oral iron   INTERVAL HISTORY: Meghan Wilson returns for follow up as scheduled. Last seen by [place] for new patient consult 12/04/21. Labs last week showed normal Hgb and iron panel.    REVIEW OF SYSTEMS:   Constitutional: Denies fevers, chills or abnormal weight loss Eyes: Denies blurriness of vision Ears, nose, mouth, throat, and face: Denies mucositis or sore throat Respiratory: Denies cough, dyspnea or wheezes Cardiovascular: Denies palpitation, chest discomfort or lower extremity swelling Gastrointestinal:  Denies nausea, heartburn or change in bowel habits Skin: Denies abnormal skin rashes Lymphatics: Denies new lymphadenopathy or easy bruising Neurological:Denies numbness, tingling or new weaknesses Behavioral/Psych: Mood is stable, no new changes  All other systems were reviewed with the patient and are negative.  MEDICAL HISTORY:  Past Medical History:  Diagnosis Date   Anemia    Anxiety    Arthritis    Back pain    Bipolar depression ([place]) 09/2015   Constipation    Depression 06/27/2021   Dizziness    GERD (gastroesophageal reflux disease)    History of colon polyps    History of kidney stones    History of patellar fracture 2016   History of tibial fracture 2016   Hyperlipidemia    IBS (irritable bowel syndrome)    Joint pain    Night terrors    Night terrors, adult 09/2015   Osteoarthritis    Pain of right knee after injury 03/10/2018   Post concussion syndrome 12/20/2019   Pre-diabetes    Seasonal allergies 11/09/2020   Sleep apnea    doesnt wear c-pap   Vitamin B 12 deficiency    Vitamin D deficiency     SURGICAL HISTORY: Past Surgical History:  Procedure Laterality Date   ABDOMINAL HYSTERECTOMY     CHOLECYSTECTOMY     CLOSED REDUCTION  TIBIAL FRACTURE Right 2003   COLONOSCOPY  2016   [place] Right 09/17/2021   Procedure: Removal of hardware from right tibia;  Surgeon: Paralee Cancel, MD;  Location: [facility];  Service: Orthopedics;  Laterality: Right;   MANDIBLE SURGERY  2022   TOTAL KNEE ARTHROPLASTY Right 10/17/2021   Procedure: TOTAL KNEE ARTHROPLASTY;  Surgeon: Paralee Cancel, MD;  Location: [facility];  Service: Orthopedics;  Laterality: Right;   WISDOM TOOTH EXTRACTION      I have reviewed the social history and family history with the patient and they are unchanged from previous note.  ALLERGIES:  is allergic to aspirin, depakote [divalproex sodium], ibuprofen, prednisone, and latex.  MEDICATIONS:  Current Outpatient Medications  Medication Sig Dispense Refill   Accu-Chek FastClix Lancets MISC use 1 strip to check blood sugar up to 7 times a week 102 each 3   amoxicillin (AMOXIL) 500 MG capsule Take 4 capsules (2,000 mg total) by mouth 1 hour prior to dentist or other procedure 4 capsule 3   atorvastatin (LIPITOR) 80 MG tablet Take 1 tablet (80 mg total) by mouth daily. 30 tablet 11   Cyanocobalamin (B-12 PO) Take 1,000 mcg by mouth 2 (two) times daily.     docusate sodium (COLACE) 100 MG capsule Take 100 mg by mouth See admin instructions. Take twice a day every other day     Flaxseed, Linseed, (FLAX SEEDS PO) Take 500 mg  by mouth 2 (two) times daily.     fluticasone (FLONASE) 50 MCG/ACT nasal spray Place 1 spray into both nostrils daily as needed for allergies or rhinitis 16 g 2   glucose blood (ACCU-CHEK GUIDE) test strip USE TO CHECK BLOOD GLUCOSE UP TO 7 TIMES A WEEK. 100 strip 3   Glycerin-Hypromellose-PEG 400 (DRY EYE RELIEF DROPS) 0.2-0.2-1 % SOLN Place 1 drop into both eyes 3 (three) times daily.     lamoTRIgine (LAMICTAL) 100 MG tablet Take 1 tablet by mouth once a day 30 tablet 3   linaclotide (LINZESS) 145 MCG CAPS capsule Take 1 capsule (145 mcg total) by mouth daily before breakfast.  30 capsule 3   metFORMIN (GLUCOPHAGE-XR) 500 MG 24 hr tablet Take 1 tablet (500 mg total) by mouth daily with breakfast. 90 tablet 3   methocarbamol (ROBAXIN) 500 MG tablet Take 1 tablet (500 mg total) by mouth every 6 (six) hours as needed for spasms. 60 tablet 1   Peppermint Oil (IBGARD) 90 MG CPCR Take as directed     polyethylene glycol (MIRALAX / GLYCOLAX) 17 g packet Mix 1 packet (17 g total) in 8 oz clear liquid and drink by mouth daily as needed for mild constipation. 14 each 0   prazosin (MINIPRESS) 1 MG capsule Take 2 capsules (2 mg total) by mouth at bedtime. 60 capsule 3   predniSONE (DELTASONE) 5 MG tablet Take as directed for 12 days. Take 6 tablets by mouth daily on days 1-4, take 4 tablets daily on days 5-8, and take 2 tablets by mouth daily on days 9-12. 48 tablet 0   pregabalin (LYRICA) 75 MG capsule Take 1 capsule (75 mg total) by mouth at bedtime as needed for nerve pain 10 capsule 0   Semaglutide,0.25 or 0.'5MG'$ /DOS, (OZEMPIC, 0.25 OR 0.5 MG/DOSE,) 2 MG/3ML SOPN Inject 0.5 mg into the skin once a week. 3 mL 0   triamcinolone cream (KENALOG) 0.1 % Apply 1 application topically daily. Apply underneath breast as needed 45 g 3   triamcinolone ointment (KENALOG) 0.5 % Apply 1 application topically daily. Apply to hands as needed daily 30 g 5   Vitamin D, Ergocalciferol, (DRISDOL) 1.25 MG (50000 UNIT) CAPS capsule Take 1 capsule (50,000 Units total) by mouth every 7 (seven) days. 5 capsule 0   No current facility-administered medications for this visit.    PHYSICAL EXAMINATION: ECOG PERFORMANCE STATUS: {CHL ONC ECOG PS:[id number]}  There were no vitals filed for this visit. There were no vitals filed for this visit.  GENERAL:alert, no distress and comfortable SKIN: skin color, texture, turgor are normal, no rashes or significant lesions EYES: normal, Conjunctiva are pink and non-injected, sclera clear OROPHARYNX:no exudate, no erythema and lips, buccal mucosa, and tongue normal   NECK: supple, thyroid normal size, non-tender, without nodularity LYMPH:  no palpable lymphadenopathy in the cervical, axillary or inguinal LUNGS: clear to auscultation and percussion with normal breathing effort HEART: regular rate & rhythm and no murmurs and no lower extremity edema ABDOMEN:abdomen soft, non-tender and normal bowel sounds Musculoskeletal:no cyanosis of digits and no clubbing  NEURO: alert & oriented x 3 with fluent speech, no focal motor/sensory deficits  LABORATORY DATA:  I have reviewed the data as listed    Latest Ref Rng & Units 03/05/2022    9:09 AM 12/04/2021   12:17 PM 11/21/2021   10:07 AM  CBC  WBC 4.0 - 10.5 K/uL 12.2  7.8    Hemoglobin 12.0 - 15.0 g/dL 13.1  10.9  Hematocrit 36.0 - 46.0 % 41.0  37.2  34.8   Platelets 150 - 400 K/uL 383  425          Latest Ref Rng & Units 11/21/2021   10:07 AM 10/18/2021    3:08 AM 10/04/2021    1:35 PM  CMP  Glucose 70 - 99 mg/dL 104  129  220   BUN 8 - 27 mg/dL '8  10  13   '$ Creatinine 0.57 - 1.00 mg/dL 0.65  0.60  0.67   Sodium 134 - 144 mmol/L 138  137  140   Potassium 3.5 - 5.2 mmol/L 4.5  3.8  3.6   Chloride 96 - 106 mmol/L 101  106  105   CO2 20 - 29 mmol/L '24  26  26   '$ Calcium 8.7 - 10.3 mg/dL 9.6  8.0  9.2   Total Protein 6.0 - 8.5 g/dL 6.6   6.7   Total Bilirubin 0.0 - 1.2 mg/dL 0.2   0.1   Alkaline Phos 44 - 121 IU/L 126   85   AST 0 - 40 IU/L 15   15   ALT 0 - 32 IU/L 15   18       RADIOGRAPHIC STUDIES: I have personally reviewed the radiological images as listed and agreed with the findings in the report. No results found.   ASSESSMENT & PLAN:  No problem-specific Assessment & Plan notes found for this encounter.   No orders of the defined types were placed in this encounter.  All questions were answered. The patient knows to call the clinic with any problems, questions or concerns. No barriers to learning was detected. I spent {CHL ONC TIME VISIT - MAUQJ:[id number]} counseling the patient  face to face. The total time spent in the appointment was {CHL ONC TIME VISIT - SLHTD:[id number]} and more than 50% was on counseling and review of test results     Alla Feeling, NP 03/09/22

## 2022-03-10 ENCOUNTER — Other Ambulatory Visit: Payer: Self-pay

## 2022-03-10 ENCOUNTER — Inpatient Hospital Stay: Payer: Medicaid Other | Admitting: Nurse Practitioner

## 2022-03-10 ENCOUNTER — Encounter: Payer: Self-pay | Admitting: Nurse Practitioner

## 2022-03-10 ENCOUNTER — Other Ambulatory Visit (HOSPITAL_COMMUNITY): Payer: Self-pay

## 2022-03-10 VITALS — BP 133/83 | HR 89 | Temp 98.5°F | Resp 16 | Wt 191.9 lb

## 2022-03-10 DIAGNOSIS — D508 Other iron deficiency anemias: Secondary | ICD-10-CM | POA: Diagnosis not present

## 2022-03-10 DIAGNOSIS — D509 Iron deficiency anemia, unspecified: Secondary | ICD-10-CM | POA: Diagnosis not present

## 2022-03-10 MED ORDER — DICLOFENAC SODIUM 75 MG PO TBEC
75.0000 mg | DELAYED_RELEASE_TABLET | Freq: Two times a day (BID) | ORAL | 1 refills | Status: DC | PRN
  Filled 2022-03-10 – 2022-03-12 (×2): qty 60, 30d supply, fill #0

## 2022-03-10 NOTE — Progress Notes (Signed)
Chief Complaint:   OBESITY Meghan Wilson is here to discuss her progress with her obesity treatment plan along with follow-up of her obesity related diagnoses. Meghan Wilson is on the Category 2 Plan and states she is following her eating plan approximately 80% of the time. Meghan Wilson states she is doing 0 minutes 0 times per week.  Today's visit was #: 6 Starting weight: 195 lbs Starting date: 11/21/2021 Today's weight: 187 lbs Today's date: 03/06/2022 Total lbs lost to date: 8 Total lbs lost since last in-office visit: 2  Interim History: Meghan Wilson is struggling with left hip pain.  She had 2 injections and a prednisone taper over the last week.  She is seeing Ortho on Monday.  She has not able to exercise over the past month due to worsening pain.  She is struggling with hunger and cravings especially since starting prednisone.  She is substituting a protein shake for breakfast.  She notes she has been feeling down since being out of work since January 12.  Subjective:   1. Other iron deficiency anemia Meghan Wilson is taking iron daily, and she denies side effects.  She is seeing hematology on a regular basis.  Her last EGD was on 01/11/2021, and colonoscopy on 01/11/2021.  2. Type 2 diabetes mellitus with other specified complication, without long-term current use of insulin (HCC) Meghan Wilson is taking Ozempic 0.5 mg, and she denies side effects.  She notes some hunger and cravings even prior to starting prednisone.  Her fasting blood sugars range in the 120s.  She denies hypoglycemia.  Assessment/Plan:   1. Other iron deficiency anemia Meghan Wilson is to keep her follow-up appointment with hematology on Monday.  2. Type 2 diabetes mellitus with other specified complication, without long-term current use of insulin ([place]) Meghan Wilson will continue Ozempic at 0.5 mg once weekly, and we will refill for 1 month.  Side effects were discussed.  - Semaglutide,0.25 or 0.'5MG'$ /DOS, (OZEMPIC, 0.25 OR 0.5 MG/DOSE,) 2 MG/3ML SOPN;  Inject 0.5 mg into the skin once a week.  Dispense: 3 mL; Refill: 0  3. Obesity, Current BMI 33.13 Meghan Wilson is currently in the action stage of change. As such, her goal is to continue with weight loss efforts. She has agreed to the Category 2 Plan.   Exercise goals: No exercise has been prescribed at this time.  Behavioral modification strategies: increasing lean protein intake, increasing water intake, and planning for success.  Meghan Wilson has agreed to follow-up with our clinic in 2 weeks. She was informed of the importance of frequent follow-up visits to maximize her success with intensive lifestyle modifications for her multiple health conditions.   Objective:   Blood pressure 133/79, pulse 98, temperature 98.2 F (36.8 C), height '5\' 3"'$  (1.6 m), weight 187 lb (84.8 kg), SpO2 99 %. Body mass index is 33.13 kg/m.  General: Cooperative, alert, well developed, in no acute distress. HEENT: Conjunctivae and lids unremarkable. Cardiovascular: Regular rhythm.  Lungs: Normal work of breathing. Neurologic: No focal deficits.   Lab Results  Component Value Date   CREATININE 0.65 11/21/2021   BUN 8 11/21/2021   NA 138 11/21/2021   K 4.5 11/21/2021   CL 101 11/21/2021   CO2 24 11/21/2021   Lab Results  Component Value Date   ALT 15 11/21/2021   AST 15 11/21/2021   ALKPHOS 126 (H) 11/21/2021   BILITOT 0.2 11/21/2021   Lab Results  Component Value Date   HGBA1C 5.9 (A) 12/18/2021   HGBA1C 6.2 (A) 08/22/2021  HGBA1C 6.3 (A) 05/02/2021   HGBA1C 5.9 (A) 10/12/2020   HGBA1C 6.0 (A) 05/11/2020   Lab Results  Component Value Date   INSULIN 5.8 11/21/2021   Lab Results  Component Value Date   TSH 0.976 11/21/2021   Lab Results  Component Value Date   CHOL 137 11/21/2021   HDL 57 11/21/2021   LDLCALC 63 11/21/2021   TRIG 87 11/21/2021   CHOLHDL 2.7 05/02/2021   Lab Results  Component Value Date   VD25OH 34.2 11/21/2021   Lab Results  Component Value Date   WBC 12.2 (H)  03/05/2022   HGB 13.1 03/05/2022   HCT 41.0 03/05/2022   MCV 75.5 (L) 03/05/2022   PLT 383 03/05/2022   Lab Results  Component Value Date   IRON 81 03/05/2022   TIBC 392 03/05/2022   FERRITIN 68 03/05/2022   Attestation Statements:   Reviewed by clinician on day of visit: allergies, medications, problem list, medical history, surgical history, family history, social history, and previous encounter notes.   Wilhemena Durie, am acting as Location manager for [employer], FNP-C.  I have reviewed the above documentation for accuracy and completeness, and I agree with the above. Meghan Pacific, FNP

## 2022-03-12 ENCOUNTER — Other Ambulatory Visit (HOSPITAL_COMMUNITY): Payer: Self-pay

## 2022-03-17 ENCOUNTER — Ambulatory Visit (INDEPENDENT_AMBULATORY_CARE_PROVIDER_SITE_OTHER): Payer: Medicaid Other | Admitting: Nurse Practitioner

## 2022-03-17 ENCOUNTER — Encounter (INDEPENDENT_AMBULATORY_CARE_PROVIDER_SITE_OTHER): Payer: Self-pay | Admitting: Nurse Practitioner

## 2022-03-17 ENCOUNTER — Ambulatory Visit (INDEPENDENT_AMBULATORY_CARE_PROVIDER_SITE_OTHER): Payer: Medicaid Other | Admitting: Bariatrics

## 2022-03-17 VITALS — BP 115/71 | HR 97 | Temp 98.3°F | Ht 63.0 in | Wt 189.0 lb

## 2022-03-17 DIAGNOSIS — Z7985 Long-term (current) use of injectable non-insulin antidiabetic drugs: Secondary | ICD-10-CM

## 2022-03-17 DIAGNOSIS — M25552 Pain in left hip: Secondary | ICD-10-CM

## 2022-03-17 DIAGNOSIS — E669 Obesity, unspecified: Secondary | ICD-10-CM

## 2022-03-17 DIAGNOSIS — E1169 Type 2 diabetes mellitus with other specified complication: Secondary | ICD-10-CM

## 2022-03-17 DIAGNOSIS — Z6833 Body mass index (BMI) 33.0-33.9, adult: Secondary | ICD-10-CM | POA: Diagnosis not present

## 2022-03-17 DIAGNOSIS — Z7984 Long term (current) use of oral hypoglycemic drugs: Secondary | ICD-10-CM

## 2022-03-18 ENCOUNTER — Emergency Department (HOSPITAL_COMMUNITY): Payer: Medicaid Other

## 2022-03-18 ENCOUNTER — Encounter (HOSPITAL_COMMUNITY): Payer: Self-pay | Admitting: Oncology

## 2022-03-18 ENCOUNTER — Other Ambulatory Visit: Payer: Self-pay

## 2022-03-18 ENCOUNTER — Emergency Department (HOSPITAL_COMMUNITY)
Admission: EM | Admit: 2022-03-18 | Discharge: 2022-03-18 | Disposition: A | Discharge status: Home / Self Care | Payer: Medicaid Other | Attending: Emergency Medicine | Admitting: Emergency Medicine

## 2022-03-18 ENCOUNTER — Encounter: Payer: Self-pay | Admitting: *Deleted

## 2022-03-18 ENCOUNTER — Telehealth: Payer: Self-pay | Admitting: Neurology

## 2022-03-18 DIAGNOSIS — E119 Type 2 diabetes mellitus without complications: Secondary | ICD-10-CM | POA: Diagnosis not present

## 2022-03-18 DIAGNOSIS — Z91040 Latex allergy status: Secondary | ICD-10-CM | POA: Insufficient documentation

## 2022-03-18 DIAGNOSIS — M25552 Pain in left hip: Secondary | ICD-10-CM | POA: Insufficient documentation

## 2022-03-18 DIAGNOSIS — Z7984 Long term (current) use of oral hypoglycemic drugs: Secondary | ICD-10-CM | POA: Insufficient documentation

## 2022-03-18 LAB — CBC WITH DIFFERENTIAL/PLATELET
Abs Immature Granulocytes: 0.03 10*3/uL (ref 0.00–0.07)
Basophils Absolute: 0 10*3/uL (ref 0.0–0.1)
Basophils Relative: 0 %
Eosinophils Absolute: 0 10*3/uL (ref 0.0–0.5)
Eosinophils Relative: 0 %
HCT: 37 % (ref 36.0–46.0)
Hemoglobin: 11.2 g/dL — ABNORMAL LOW (ref 12.0–15.0)
Immature Granulocytes: 0 %
Lymphocytes Relative: 26 %
Lymphs Abs: 2.2 10*3/uL (ref 0.7–4.0)
MCH: 24.5 pg — ABNORMAL LOW (ref 26.0–34.0)
MCHC: 30.3 g/dL (ref 30.0–36.0)
MCV: 81 fL (ref 80.0–100.0)
Monocytes Absolute: 0.6 10*3/uL (ref 0.1–1.0)
Monocytes Relative: 6 %
Neutro Abs: 5.8 10*3/uL (ref 1.7–7.7)
Neutrophils Relative %: 68 %
Platelets: 299 10*3/uL (ref 150–400)
RBC: 4.57 MIL/uL (ref 3.87–5.11)
RDW: 21 % — ABNORMAL HIGH (ref 11.5–15.5)
WBC: 8.7 10*3/uL (ref 4.0–10.5)
nRBC: 0 % (ref 0.0–0.2)

## 2022-03-18 LAB — COMPREHENSIVE METABOLIC PANEL
ALT: 24 U/L (ref 0–44)
AST: 13 U/L — ABNORMAL LOW (ref 15–41)
Albumin: 3.5 g/dL (ref 3.5–5.0)
Alkaline Phosphatase: 71 U/L (ref 38–126)
Anion gap: 6 (ref 5–15)
BUN: 26 mg/dL — ABNORMAL HIGH (ref 8–23)
CO2: 25 mmol/L (ref 22–32)
Calcium: 8.8 mg/dL — ABNORMAL LOW (ref 8.9–10.3)
Chloride: 110 mmol/L (ref 98–111)
Creatinine, Ser: 0.6 mg/dL (ref 0.44–1.00)
GFR, Estimated: >60 mL/min (ref >60)
Glucose, Bld: 108 mg/dL — ABNORMAL HIGH (ref 70–99)
Potassium: 4.1 mmol/L (ref 3.5–5.1)
Sodium: 141 mmol/L (ref 135–145)
Total Bilirubin: 0.1 mg/dL — ABNORMAL LOW (ref 0.3–1.2)
Total Protein: 6.7 g/dL (ref 6.5–8.1)

## 2022-03-18 LAB — LIPASE, BLOOD: Lipase: 41 U/L (ref 11–51)

## 2022-03-18 LAB — ACETAMINOPHEN LEVEL: Acetaminophen (Tylenol), Serum: <10 ug/mL — ABNORMAL LOW (ref 10–30)

## 2022-03-18 MED ORDER — OXYCODONE-ACETAMINOPHEN 5-325 MG PO TABS: 1.0000 | ORAL_TABLET | Freq: Four times a day (QID) | ORAL | 0 refills | Status: DC | PRN

## 2022-03-18 MED ORDER — MORPHINE SULFATE (PF) 4 MG/ML IV SOLN
4.0000 mg | Freq: Once | INTRAVENOUS | Status: AC
  Administered 2022-03-18: 4 mg via INTRAVENOUS
  Filled 2022-03-18: qty 1

## 2022-03-18 MED ORDER — IOHEXOL 300 MG/ML  SOLN
100.0000 mL | Freq: Once | INTRAMUSCULAR | Status: AC | PRN
  Administered 2022-03-18: 100 mL via INTRAVENOUS

## 2022-03-18 MED ORDER — ONDANSETRON HCL 4 MG/2ML IJ SOLN
4.0000 mg | Freq: Once | INTRAMUSCULAR | Status: AC
  Administered 2022-03-18: 4 mg via INTRAVENOUS
  Filled 2022-03-18: qty 2

## 2022-03-18 NOTE — Progress Notes (Unsigned)
Chief Complaint:   OBESITY Meghan Wilson is here to discuss her progress with her obesity treatment plan along with follow-up of her obesity related diagnoses. Meghan Wilson is on the Category 2 Plan and states she is following her eating plan approximately ?% of the time. Meghan Wilson states she is exercising 0 minutes 0 times per week.  Today's visit was #: 7 Starting weight: 195 lbs Starting date: 11/21/2021 Today's weight: 189 lbs Today's date: 03/17/2022 Total lbs lost to date: 6 lbs Total lbs lost since last in-office visit: 0  Interim History: Meghan Wilson is still struggling with left hip pain. She talked with Ortho today and saw Ortho last in office on 03/10/22. She was started on Voltaren, finished Prednisone last week. Hoping to get a MRI tonight or tomorrow. She is struggling and crying due to pain. Can not go to the gym now. Struggling to walk.  Subjective:   1. Type 2 diabetes mellitus with other specified complication, without long-term current use of insulin (HCC) Meghan Wilson is taking Ozempic 0.5 mg. Denies any side effects. Her last fasting blood sugar 120s. Denies hypoglycemia. She is taking Metformin XR '500mg'$  daily.  2. Pain of left hip (See Note)  Assessment/Plan:   1. Type 2 diabetes mellitus with other specified complication, without long-term current use of insulin (HCC) Continue Ozempic and Metformin. Good blood sugar control is important to decrease the likelihood of diabetic complications such as nephropathy, neuropathy, limb loss, blindness, coronary artery disease, and death. Intensive lifestyle modification including diet, exercise and weight loss are the first line of treatment for diabetes.    2. Pain of left hip Meghan Wilson will follow up with Ortho.  3. Obesity, Current BMI 33.6 Meghan Wilson is currently in the action stage of change. As such, her goal is to continue with weight loss efforts. She has agreed to the Category 2 Plan.   We will obtain labs at next visit, if  needed.  Exercise goals: All adults should avoid inactivity. Some physical activity is better than none, and adults who participate in any amount of physical activity gain some health benefits.  Behavioral modification strategies: increasing lean protein intake, increasing water intake, and planning for success.  Meghan Wilson has agreed to follow-up with our clinic in 3 weeks. She was informed of the importance of frequent follow-up visits to maximize her success with intensive lifestyle modifications for her multiple health conditions.   Objective:   Blood pressure 115/71, pulse 97, temperature 98.3 F (36.8 C), height '5\' 3"'$  (1.6 m), weight 189 lb (85.7 kg), SpO2 98 %. Body mass index is 33.48 kg/m.  General: Cooperative, alert, well developed, in no acute distress. HEENT: Conjunctivae and lids unremarkable. Cardiovascular: Regular rhythm.  Lungs: Normal work of breathing. Neurologic: No focal deficits.   Lab Results  Component Value Date   CREATININE 0.60 03/18/2022   BUN 26 (H) 03/18/2022   NA 141 03/18/2022   K 4.1 03/18/2022   CL 110 03/18/2022   CO2 25 03/18/2022   Lab Results  Component Value Date   ALT 24 03/18/2022   AST 13 (L) 03/18/2022   ALKPHOS 71 03/18/2022   BILITOT 0.1 (L) 03/18/2022   Lab Results  Component Value Date   HGBA1C 5.9 (A) 12/18/2021   HGBA1C 6.2 (A) 08/22/2021   HGBA1C 6.3 (A) 05/02/2021   HGBA1C 5.9 (A) 10/12/2020   HGBA1C 6.0 (A) 05/11/2020   Lab Results  Component Value Date   INSULIN 5.8 11/21/2021   Lab Results  Component Value  Date   TSH 0.976 11/21/2021   Lab Results  Component Value Date   CHOL 137 11/21/2021   HDL 57 11/21/2021   LDLCALC 63 11/21/2021   TRIG 87 11/21/2021   CHOLHDL 2.7 05/02/2021   Lab Results  Component Value Date   VD25OH 34.2 11/21/2021   Lab Results  Component Value Date   WBC 8.7 03/18/2022   HGB 11.2 (L) 03/18/2022   HCT 37.0 03/18/2022   MCV 81.0 03/18/2022   PLT 299 03/18/2022   Lab  Results  Component Value Date   IRON 81 03/05/2022   TIBC 392 03/05/2022   FERRITIN 68 03/05/2022   Attestation Statements:   Reviewed by clinician on day of visit: allergies, medications, problem list, medical history, surgical history, family history, social history, and previous encounter notes.  I, Brendell Tyus, RMA, am acting as transcriptionist for Everardo Pacific, FNP.  I have reviewed the above documentation for accuracy and completeness, and I agree with the above. -  ***

## 2022-03-18 NOTE — ED Provider Notes (Signed)
[facility] DEPT Provider Note   CSN: [id number] Arrival date & time: 03/18/22  1438     History  Chief Complaint  Patient presents with   Hip Pain    Meghan Wilson is a 61 y.o. female history diabetes, bipolar, IBS, osteoarthritis here for evaluation of left hip pain.  States has been going on for weeks.  She is followed by Dr. Alvan Dame with orthopedics.  Had x-rays performed.  Did a course of oral steroids which did not help.  Subsequently was seen again had x-ray of her back to see if this is more radicular symptoms.  X-rays did not show any significant findings.  Was given steroid injections in her hip.  Patient states this is not helped.  She is also doing oral Voltaren.  She is also taking Tylenol at home.  She continues to have pain.  Feels like when she walks like her left hip gets stuck.  She denies any recent falls or injuries.  No swelling, redness, numbness or weakness to extremities.  No bowel or bladder incontinence, saddle paresthesias.  No fever.  No overlying redness, warmth.  She has close follow-up with orthopedics for an MRI.  Today after taking her medications she developed some nausea and vomiting.  No bloody stools. No CP, SOB, le swelling, numbness, weakness, abd pain.  HPI     Home Medications Prior to Admission medications   Medication Sig Start Date End Date Taking? Authorizing Provider  oxyCODONE-acetaminophen (PERCOCET/ROXICET) 5-325 MG tablet Take 1 tablet by mouth every 6 (six) hours as needed for severe pain. 03/18/22  Yes Remmi Armenteros A, PA-C  Accu-Chek FastClix Lancets MISC use 1 strip to check blood sugar up to 7 times a week 02/01/22   Idamae Schuller, MD  amoxicillin (AMOXIL) 500 MG capsule Take 4 capsules (2,000 mg total) by mouth 1 hour prior to dentist or other procedure 01/20/22     atorvastatin (LIPITOR) 80 MG tablet Take 1 tablet (80 mg total) by mouth daily. 09/13/21   Maudie Mercury, MD  Cyanocobalamin (B-12 PO) Take 1,000  mcg by mouth 2 (two) times daily.    [provider]  diclofenac (VOLTAREN) 75 MG EC tablet Take 1 tablet (75 mg total) by mouth 2 (two) times daily as needed. 03/10/22     docusate sodium (COLACE) 100 MG capsule Take 100 mg by mouth See admin instructions. Take twice a day every other day    [provider]  Flaxseed, Linseed, (FLAX SEEDS PO) Take 500 mg by mouth 2 (two) times daily.    [provider]  fluticasone Asencion Islam) 50 MCG/ACT nasal spray Place 1 spray into both nostrils daily as needed for allergies or rhinitis 05/30/21 05/30/22  Idamae Schuller, MD  glucose blood (ACCU-CHEK GUIDE) test strip USE TO CHECK BLOOD GLUCOSE UP TO 7 TIMES A WEEK. 02/01/22 01/31/23  Idamae Schuller, MD  Glycerin-Hypromellose-PEG 400 (DRY EYE RELIEF DROPS) 0.2-0.2-1 % SOLN Place 1 drop into both eyes 3 (three) times daily.    [provider]  lamoTRIgine (LAMICTAL) 100 MG tablet Take 1 tablet by mouth once a day 12/27/21     linaclotide (LINZESS) 145 MCG CAPS capsule Take 1 capsule (145 mcg total) by mouth daily before breakfast. 02/25/22   Armbruster, Carlota Raspberry, MD  metFORMIN (GLUCOPHAGE-XR) 500 MG 24 hr tablet Take 1 tablet (500 mg total) by mouth daily with breakfast. 12/13/21   Idamae Schuller, MD  methocarbamol (ROBAXIN) 500 MG tablet Take 1 tablet (500 mg  total) by mouth every 6 (six) hours as needed for spasms. 02/27/22     Peppermint Oil (IBGARD) 90 MG CPCR Take as directed Patient not taking: Reported on 03/17/2022 07/17/21   Armbruster, Carlota Raspberry, MD  polyethylene glycol (MIRALAX / GLYCOLAX) 17 g packet Mix 1 packet (17 g total) in 8 oz clear liquid and drink by mouth daily as needed for mild constipation. 10/18/21   Irving Copas, PA-C  prazosin (MINIPRESS) 1 MG capsule Take 2 capsules (2 mg total) by mouth at bedtime. 12/27/21     predniSONE (DELTASONE) 5 MG tablet Take as directed for 12 days. Take 6 tablets by mouth daily on days 1-4, take 4 tablets daily on days 5-8, and take 2 tablets  by mouth daily on days 9-12. 02/27/22     pregabalin (LYRICA) 75 MG capsule Take 1 capsule (75 mg total) by mouth at bedtime as needed for nerve pain 11/11/21     Semaglutide,0.25 or 0.'5MG'$ /DOS, (OZEMPIC, 0.25 OR 0.5 MG/DOSE,) 2 MG/3ML SOPN Inject 0.5 mg into the skin once a week. 03/06/22   Everardo Pacific, FNP  triamcinolone cream (KENALOG) 0.1 % Apply 1 application topically daily. Apply underneath breast as needed 06/17/21   Lacinda Axon, MD  triamcinolone ointment (KENALOG) 0.5 % Apply 1 application topically daily. Apply to hands as needed daily 06/17/21   Lacinda Axon, MD  Vitamin D, Ergocalciferol, (DRISDOL) 1.25 MG (50000 UNIT) CAPS capsule Take 1 capsule (50,000 Units total) by mouth every 7 (seven) days. 02/17/22   Everardo Pacific, FNP      Allergies    Aspirin, Depakote [divalproex sodium], Ibuprofen, Prednisone, and Latex    Review of Systems   Review of Systems  Constitutional: Negative.   HENT: Negative.    Respiratory: Negative.    Cardiovascular: Negative.   Gastrointestinal: Negative.   Genitourinary: Negative.   Musculoskeletal:        Back and left hip pain  Skin: Negative.   Neurological: Negative.   All other systems reviewed and are negative.   Physical Exam Updated Vital Signs BP 128/90   Pulse 82   Temp 98 F (36.7 C) (Oral)   Resp 16   Ht '5\' 3"'$  (1.6 m)   Wt 83.5 kg   SpO2 99%   BMI 32.59 kg/m  Physical Exam Vitals and nursing note reviewed.  Constitutional:      General: She is not in acute distress.    Appearance: She is well-developed. She is not ill-appearing, toxic-appearing or diaphoretic.  HENT:     Head: Normocephalic and atraumatic.     Nose: Nose normal.     Mouth/Throat:     Mouth: Mucous membranes are moist.  Eyes:     Pupils: Pupils are equal, round, and reactive to light.  Cardiovascular:     Rate and Rhythm: Normal rate.     Pulses: Normal pulses.          Dorsalis pedis pulses are 2+ on the right side  and 2+ on the left side.     Heart sounds: Normal heart sounds.  Pulmonary:     Effort: Pulmonary effort is normal. No respiratory distress.     Breath sounds: Normal breath sounds.  Abdominal:     General: Bowel sounds are normal. There is no distension.     Palpations: Abdomen is soft.     Tenderness: There is abdominal tenderness. There is no right CVA tenderness, left CVA tenderness, guarding or rebound.  Comments: Mild tenderness to LLQ, no rebound or guarding  Musculoskeletal:        General: Normal range of motion.     Cervical back: Normal range of motion.     Comments: Midline spinal tenderness.  Diffuse tenderness to left lateral aspect hip, posterior aspect hip at piriformis. Neg straight leg raise. No overlying skin changes. No bony tenderness at femur, knee, tib fib  Skin:    General: Skin is warm and dry.     Capillary Refill: Capillary refill takes less than 2 seconds.     Comments: No edema, erythema or warmth.  No fluctuance induration.  Neurological:     General: No focal deficit present.     Mental Status: She is alert and oriented to person, place, and time.     Cranial Nerves: Cranial nerves 2-12 are intact.     Sensory: Sensation is intact.     Motor: Motor function is intact.     Coordination: Coordination is intact.     Comments: Ambulatory with cane  Psychiatric:        Mood and Affect: Mood normal.     ED Results / Procedures / Treatments   Labs (all labs ordered are listed, but only abnormal results are displayed) Labs Reviewed  CBC WITH DIFFERENTIAL/PLATELET - Abnormal; Notable for the following components:      Result Value   Hemoglobin 11.2 (*)    MCH 24.5 (*)    RDW 21.0 (*)    All other components within normal limits  COMPREHENSIVE METABOLIC PANEL - Abnormal; Notable for the following components:   Glucose, Bld 108 (*)    BUN 26 (*)    Calcium 8.8 (*)    AST 13 (*)    Total Bilirubin 0.1 (*)    All other components within normal  limits  ACETAMINOPHEN LEVEL - Abnormal; Notable for the following components:   Acetaminophen (Tylenol), Serum <10 (*)    All other components within normal limits  LIPASE, BLOOD  URINALYSIS, ROUTINE W REFLEX MICROSCOPIC    EKG None  Radiology CT Abdomen Pelvis W Contrast  Result Date: 03/18/2022 CLINICAL DATA:  LLQ abdominal pain EXAM: CT ABDOMEN AND PELVIS WITH CONTRAST TECHNIQUE: Multidetector CT imaging of the abdomen and pelvis was performed using the standard protocol following bolus administration of intravenous contrast. RADIATION DOSE REDUCTION: This exam was performed according to the departmental dose-optimization program which includes automated exposure control, adjustment of the mA and/or kV according to patient size and/or use of iterative reconstruction technique. CONTRAST:  144m OMNIPAQUE IOHEXOL 300 MG/ML  SOLN COMPARISON:  September 2022 FINDINGS: Lower chest: Bibasilar atelectasis/scarring. Hepatobiliary: No focal liver abnormality is seen. Status post cholecystectomy. No biliary dilatation. Pancreas: Unremarkable. No pancreatic ductal dilatation or surrounding inflammatory changes. Spleen: Normal in size without focal abnormality. Adrenals/Urinary Tract: Adrenals are unremarkable. Kidneys and bladder are unremarkable. Stomach/Bowel: Stomach is within normal limits. Bowel is normal in caliber. Few small bowel air-fluid levels in the left abdomen without additional evidence of obstruction. Distal colonic diverticulosis. Normal appendix. Vascular/Lymphatic: Atherosclerosis.  No enlarged nodes. Reproductive: Status post hysterectomy. No adnexal masses. Other: No free fluid.  Abdominal wall is unremarkable. Musculoskeletal: No acute osseous abnormality. IMPRESSION: No acute abnormality. Colonic diverticulosis. Electronically Signed   By: PMacy MisM.D.   On: 03/18/2022 19:40   CT Lumbar Spine Wo Contrast  Result Date: 03/18/2022 CLINICAL DATA:  Low back pain left hip pain  EXAM: CT LUMBAR SPINE WITHOUT CONTRAST TECHNIQUE: Multidetector CT imaging  of the lumbar spine was performed without intravenous contrast administration. Multiplanar CT image reconstructions were also generated. RADIATION DOSE REDUCTION: This exam was performed according to the departmental dose-optimization program which includes automated exposure control, adjustment of the mA and/or kV according to patient size and/or use of iterative reconstruction technique. COMPARISON:  CT abdomen 05/26/2021 FINDINGS: Segmentation: The lowest lumbar type non-rib-bearing vertebra is labeled as L5. Alignment: No vertebral subluxation is observed. Vertebrae: Suspected bony demineralization. No fracture or acute bony findings. There is degenerative facet arthropathy at L5-S1, left greater than right, with associated bony sclerosis and spurring. Sclerosis in the left SI joint, likely degenerative. Paraspinal and other soft tissues: Atherosclerosis is present, including aortoiliac atherosclerotic disease. Cholecystectomy. Disc levels: L1-2: Unremarkable. L2-3: Unremarkable. L3-4: No impingement.  Mild left eccentric disc bulge. L4-5: Borderline left foraminal stenosis due to facet arthropathy and disc bulge. L5-S1: Mild left foraminal stenosis due to facet arthropathy. IMPRESSION: 1. Mild lumbar spondylosis and degenerative disc disease. There is mild left foraminal stenosis at L5-S1 due to asymmetric facet arthropathy. 2.  Aortic Atherosclerosis (ICD10-I70.0). 3. Suspected bony demineralization.  No fracture observed. Electronically Signed   By: Van Clines M.D.   On: 03/18/2022 16:28   CT Hip Left Wo Contrast  Result Date: 03/18/2022 CLINICAL DATA:  Left hip pain since February 08, 2022 EXAM: CT OF THE LEFT HIP WITHOUT CONTRAST TECHNIQUE: Multidetector CT imaging of the left hip was performed according to the standard protocol. Multiplanar CT image reconstructions were also generated. RADIATION DOSE REDUCTION: This exam was  performed according to the departmental dose-optimization program which includes automated exposure control, adjustment of the mA and/or kV according to patient size and/or use of iterative reconstruction technique. COMPARISON:  CT pelvis 05/26/2021 FINDINGS: Bones/Joint/Cartilage No fracture or acute bony findings. No well-defined sclerotic band indicate a stress fracture. No obvious periosteal reaction. MRI can have improved sensitivity for stress fractures due to ability to detect marrow edema. There is mild sclerosis along the iliac side of the left sacroiliac joint. No hip joint effusion. Preserved articular cartilage space. Ligaments Suboptimally assessed by CT. Muscles and Tendons Chronic appearing gluteus minimus atrophy. Normal appearance of the left piriformis muscle without findings of sciatic notch impingement. Soft tissues Iliac artery atherosclerosis. Sigmoid colon diverticulosis. Uterus absent. Urinary bladder unremarkable. IMPRESSION: 1. No compelling indicator of stress fracture or acute fracture. Preserved articular space in the left hip. 2. Sigmoid colon diverticulosis. 3. Atherosclerosis. 4. Mild sclerosis along the iliac side of the left sacroiliac joint, probably degenerative although osteitis condensans ilii and have a similar appearance. Electronically Signed   By: Van Clines M.D.   On: 03/18/2022 16:22    Procedures Procedures    Medications Ordered in ED Medications  morphine (PF) 4 MG/ML injection 4 mg (4 mg Intravenous Given 03/18/22 1851)  ondansetron (ZOFRAN) injection 4 mg (4 mg Intravenous Given 03/18/22 1852)  iohexol (OMNIPAQUE) 300 MG/ML solution 100 mL (100 mLs Intravenous Contrast Given 03/18/22 1919)    ED Course/ Medical Decision Making/ A&P     61 year old here for evaluation of approximately 1 month of left hip pain.  Followed by orthopedics.  She has a nonfocal neuro exam without deficits.  Has had course of oral steroids, left hip bursa steroid  injections without relief.  Taking Voltaren without relief.  She has negative straight leg raise.  Does have some mild tenderness to her left lower abdomen.  Some nausea and vomiting after taking her medications at home.  Plan on labs, imaging  and reassess  Labs and imaging personally viewed and interpreted:  CBC without leukocytosis Metabolic panel glucose 166, BUN 26, patient denies any melanotic stools Seen medicine within normal limits Lipase 41 CT left hip without significant abnormality CT lumbar without significant abnormality   Patient reasssed. Now having abd pain, LLQ, hx of diverticulutis. States diarrhea last 2 days. Will get CT AB however I do not think her abd pain and her hip pain are related. No evidence of VTE on exam.  CTAP without acute abnormality.  Clear etiology as to why patient has been having this pain for quite some time.  She has no evidence of infectious process on exam to suggest septic arthritis, gout, hemarthrosis, cellulitis, rhabdo myelitis, myositis. No swelling to her lower extremities however suspicion for VTE, normal pulses low suspicion for ischemia.  She has no midline spinal tenderness, negative straight leg raise low suspicion for cauda equina, discitis, osteomyelitis, transverse myelitis, psoas abscess.  She has mild tenderness to her left lower abdomen however negative CT scan and labs.  No urinary symptoms.  No back pain going through to her abdomen to suggest AAA, dissection. No gas forming organism on imaging.  The patient has been appropriately medically screened and/or stabilized in the ED. I have low suspicion for any other emergent medical condition which would require further screening, evaluation or treatment in the ED or require inpatient management.  Patient is hemodynamically stable and in no acute distress.  Patient able to ambulate in department prior to ED.  Evaluation does not show acute pathology that would require ongoing or additional  emergent interventions while in the emergency department or further inpatient treatment.  I have discussed the diagnosis with the patient and answered all questions.  Pain is been managed while in the emergency department and patient has no further complaints prior to discharge.  Patient is comfortable with plan discussed in room and is stable for discharge at this time.  I have discussed strict return precautions for returning to the emergency department.  Patient was encouraged to follow-up with PCP/specialist refer to at discharge.                            Medical Decision Making Amount and/or Complexity of Data Reviewed External Data Reviewed: labs, radiology and notes. Labs: ordered. Decision-making details documented in ED Course. Radiology: ordered and independent interpretation performed. Decision-making details documented in ED Course.  Risk OTC drugs. Prescription drug management. Parenteral controlled substances. Diagnosis or treatment significantly limited by social determinants of health.          Final Clinical Impression(s) / ED Diagnoses Final diagnoses:  Left hip pain    Rx / DC Orders ED Discharge Orders          Ordered    oxyCODONE-acetaminophen (PERCOCET/ROXICET) 5-325 MG tablet  Every 6 hours PRN        03/18/22 2055              Arwyn Besaw A, PA-C 03/18/22 2244    Regan Lemming, MD 03/18/22 2303

## 2022-03-18 NOTE — Discharge Instructions (Addendum)
Take the medication as prescribed.  Do not drive or operate heavy machinery if taking this medication.  Contact orthopedics to schedule the MRI of your hip.  Return for new or worsening symptoms.

## 2022-03-18 NOTE — Telephone Encounter (Signed)
Pt called to cancel appt due to have not received CPAP machine. Pt picking up CPAP machine on 03/26/22.  Rescheduled initial  CPAP  appt on 05/08/22 at 11:30 am.

## 2022-03-18 NOTE — ED Notes (Signed)
Patient currently sitting in bed, waiting on results from imaging.

## 2022-03-18 NOTE — ED Triage Notes (Signed)
Pt c/o left hip pain since June 10th 2023.  Followed by Dr. Alvan Dame and seen by him for same. Dr. Alvan Dame has given 3 steroid shots to left hip, muscle relaxer's and antiinflammatories.  None of above interventions have been effective. Pt states she has x-rays only at this time.

## 2022-03-18 NOTE — ED Provider Triage Note (Signed)
Emergency Medicine Provider Triage Evaluation Note  Meghan Wilson , a 61 y.o. female  was evaluated in triage.  Pt complains of left hip pain.  States has been going on for weeks.  Followed by Dr. Alvan Dame with [facility].  Had x-ray left hip as well as back.  Pain extends into leg.  States she did a round of steroids, muscle relaxers, Voltaren without relief.  States she also had 3 shots in her left hip without relief.  Patient states she continues to have pain.  She has not been to walk with a cane.  She has not been back to work.  She has no known injury.  No fever, bowel or bladder incontinence, saddle paresthesia.  States today she had a few episodes of emesis which she relates to taking the Voltaren, and Tylenol which she typically does not take.  She denies any melanotic stools.  Review of Systems  Positive: Left hip, low back pain Negative: Fever  Physical Exam  BP 127/90 (BP Location: Right Arm)   Pulse 93   Temp 98.4 F (36.9 C) (Oral)   Resp 16   Ht '5\' 3"'$  (1.6 m)   Wt 83.5 kg   SpO2 97%   BMI 32.59 kg/m  Gen:   Awake, no distress   Resp:  Normal effort  MSK:   Moves extremities without difficulty  Other:    Medical Decision Making  Medically screening exam initiated at 3:10 PM.  Appropriate orders placed.  Meghan Wilson was informed that the remainder of the evaluation will be completed by another provider, this initial triage assessment does not replace that evaluation, and the importance of remaining in the ED until their evaluation is complete.  Left hip, low back pain   Jeramy Dimmick A, PA-C 03/18/22 1511

## 2022-03-19 ENCOUNTER — Encounter: Payer: Medicaid Other | Admitting: Adult Health

## 2022-03-21 ENCOUNTER — Other Ambulatory Visit (HOSPITAL_COMMUNITY): Payer: Self-pay

## 2022-03-21 MED ORDER — CELECOXIB 200 MG PO CAPS
200.0000 mg | ORAL_CAPSULE | Freq: Every day | ORAL | 1 refills | Status: DC
  Filled 2022-03-21: qty 30, 30d supply, fill #0
  Filled 2022-05-01: qty 30, 30d supply, fill #1

## 2022-03-24 ENCOUNTER — Other Ambulatory Visit (HOSPITAL_COMMUNITY): Payer: Self-pay

## 2022-03-24 MED ORDER — OXYCODONE HCL 5 MG PO TABS
5.0000 mg | ORAL_TABLET | Freq: Every evening | ORAL | 0 refills | Status: DC | PRN
  Filled 2022-03-24 – 2022-03-26 (×2): qty 10, 10d supply, fill #0
  Filled 2022-03-27: qty 7, 7d supply, fill #0
  Filled 2022-03-31: qty 7, 7d supply, fill #1

## 2022-03-25 ENCOUNTER — Other Ambulatory Visit (HOSPITAL_COMMUNITY): Payer: Self-pay

## 2022-03-25 MED ORDER — LAMOTRIGINE 100 MG PO TABS
100.0000 mg | ORAL_TABLET | Freq: Every day | ORAL | 1 refills | Status: DC
Start: 2022-03-25 — End: 2022-10-09
  Filled 2022-03-25: qty 90, 90d supply, fill #0
  Filled 2022-09-04: qty 90, 90d supply, fill #1

## 2022-03-25 MED ORDER — PRAZOSIN HCL 1 MG PO CAPS
2.0000 mg | ORAL_CAPSULE | Freq: Every day | ORAL | 1 refills | Status: DC
  Filled 2022-03-25: qty 180, 90d supply, fill #0

## 2022-03-26 ENCOUNTER — Other Ambulatory Visit (HOSPITAL_COMMUNITY): Payer: Self-pay

## 2022-03-26 MED ORDER — OXYCODONE HCL 5 MG PO TABS
5.0000 mg | ORAL_TABLET | Freq: Every evening | ORAL | 0 refills | Status: DC | PRN
  Filled 2022-03-26: qty 10, 10d supply, fill #0

## 2022-03-27 ENCOUNTER — Other Ambulatory Visit (HOSPITAL_COMMUNITY): Payer: Self-pay

## 2022-03-27 ENCOUNTER — Telehealth (INDEPENDENT_AMBULATORY_CARE_PROVIDER_SITE_OTHER): Payer: Self-pay | Admitting: Nurse Practitioner

## 2022-03-27 NOTE — Telephone Encounter (Signed)
Pt called to state she wanted to talk to [facility] about her leg.

## 2022-03-27 NOTE — Telephone Encounter (Signed)
Called patient and left a voice message

## 2022-03-28 ENCOUNTER — Other Ambulatory Visit (HOSPITAL_COMMUNITY): Payer: Self-pay

## 2022-03-31 ENCOUNTER — Other Ambulatory Visit (INDEPENDENT_AMBULATORY_CARE_PROVIDER_SITE_OTHER): Payer: Self-pay | Admitting: Nurse Practitioner

## 2022-03-31 ENCOUNTER — Other Ambulatory Visit (HOSPITAL_COMMUNITY): Payer: Self-pay

## 2022-03-31 DIAGNOSIS — E1169 Type 2 diabetes mellitus with other specified complication: Secondary | ICD-10-CM

## 2022-03-31 DIAGNOSIS — E559 Vitamin D deficiency, unspecified: Secondary | ICD-10-CM

## 2022-03-31 NOTE — Telephone Encounter (Signed)
Spoke to patient just needed a reminder about  f/u appointment in September

## 2022-04-01 ENCOUNTER — Other Ambulatory Visit (HOSPITAL_COMMUNITY): Payer: Self-pay

## 2022-04-01 ENCOUNTER — Telehealth (INDEPENDENT_AMBULATORY_CARE_PROVIDER_SITE_OTHER): Payer: Self-pay

## 2022-04-01 NOTE — Telephone Encounter (Signed)
Patient is scheduled for 8/10 and will be out of her Vitamin D, Ergocalciferol, (DRISDOL) 1.25 MG (50000 UNIT) CAPS capsule  Semaglutide,0.25 or 0.'5MG'$ /DOS, (OZEMPIC, 0.25 OR 0.5 MG/DOSE,) 2 MG/3ML SOPN  before her appt.     would like to get it sent to  [place] Phone:  [phone]  Fax:  [phone]

## 2022-04-02 NOTE — Telephone Encounter (Signed)
Please advise 

## 2022-04-03 ENCOUNTER — Other Ambulatory Visit (HOSPITAL_COMMUNITY): Payer: Self-pay

## 2022-04-03 ENCOUNTER — Other Ambulatory Visit (INDEPENDENT_AMBULATORY_CARE_PROVIDER_SITE_OTHER): Payer: Self-pay | Admitting: Nurse Practitioner

## 2022-04-03 DIAGNOSIS — E1169 Type 2 diabetes mellitus with other specified complication: Secondary | ICD-10-CM

## 2022-04-03 MED ORDER — OZEMPIC (0.25 OR 0.5 MG/DOSE) 2 MG/3ML ~~LOC~~ SOPN
0.5000 mg | PEN_INJECTOR | SUBCUTANEOUS | 0 refills | Status: DC
  Filled 2022-04-03: qty 3, 28d supply, fill #0

## 2022-04-04 ENCOUNTER — Other Ambulatory Visit (HOSPITAL_COMMUNITY): Payer: Self-pay

## 2022-04-04 MED ORDER — ACETAMINOPHEN-CODEINE 300-30 MG PO TABS
1.0000 | ORAL_TABLET | Freq: Two times a day (BID) | ORAL | 0 refills | Status: DC | PRN
  Filled 2022-04-04 – 2022-04-07 (×2): qty 30, 15d supply, fill #0

## 2022-04-07 ENCOUNTER — Encounter: Payer: Self-pay | Admitting: Student

## 2022-04-07 ENCOUNTER — Other Ambulatory Visit: Payer: Self-pay

## 2022-04-07 ENCOUNTER — Other Ambulatory Visit (HOSPITAL_COMMUNITY): Payer: Self-pay

## 2022-04-07 ENCOUNTER — Ambulatory Visit: Payer: Medicaid Other | Admitting: Student

## 2022-04-07 VITALS — BP 121/74 | HR 87 | Temp 98.0°F | Ht 63.0 in | Wt 194.0 lb

## 2022-04-07 DIAGNOSIS — E785 Hyperlipidemia, unspecified: Secondary | ICD-10-CM | POA: Diagnosis not present

## 2022-04-07 DIAGNOSIS — Z6835 Body mass index (BMI) 35.0-35.9, adult: Secondary | ICD-10-CM | POA: Diagnosis not present

## 2022-04-07 DIAGNOSIS — D509 Iron deficiency anemia, unspecified: Secondary | ICD-10-CM | POA: Diagnosis not present

## 2022-04-07 DIAGNOSIS — M25552 Pain in left hip: Secondary | ICD-10-CM

## 2022-04-07 DIAGNOSIS — E119 Type 2 diabetes mellitus without complications: Secondary | ICD-10-CM | POA: Diagnosis not present

## 2022-04-07 DIAGNOSIS — G4733 Obstructive sleep apnea (adult) (pediatric): Secondary | ICD-10-CM

## 2022-04-07 DIAGNOSIS — Z7984 Long term (current) use of oral hypoglycemic drugs: Secondary | ICD-10-CM

## 2022-04-07 DIAGNOSIS — F32A Depression, unspecified: Secondary | ICD-10-CM

## 2022-04-07 DIAGNOSIS — E669 Obesity, unspecified: Secondary | ICD-10-CM | POA: Diagnosis not present

## 2022-04-07 DIAGNOSIS — D649 Anemia, unspecified: Secondary | ICD-10-CM | POA: Insufficient documentation

## 2022-04-07 DIAGNOSIS — E7801 Familial hypercholesterolemia: Secondary | ICD-10-CM

## 2022-04-07 HISTORY — DX: Obstructive sleep apnea (adult) (pediatric): G47.33

## 2022-04-07 LAB — POCT GLYCOSYLATED HEMOGLOBIN (HGB A1C): Hemoglobin A1C: 6 % — AB (ref 4.0–5.6)

## 2022-04-07 LAB — GLUCOSE, CAPILLARY: Glucose-Capillary: 86 mg/dL (ref 70–99)

## 2022-04-07 MED ORDER — PREGABALIN 75 MG PO CAPS
75.0000 mg | ORAL_CAPSULE | Freq: Every evening | ORAL | 5 refills | Status: DC | PRN
  Filled 2022-04-07: qty 30, 30d supply, fill #0
  Filled 2022-05-01 – 2022-05-09 (×2): qty 30, 30d supply, fill #1
  Filled 2022-07-28: qty 30, 30d supply, fill #2
  Filled 2022-09-10: qty 30, 30d supply, fill #3

## 2022-04-07 NOTE — Assessment & Plan Note (Signed)
Patient was referred to weight management clinic. Currently 194 lb and and used to be active, but this has been a problem in the setting of pain. Has gained weight with recurrent steorid courses. Patient has been on Ozempic 0.5 mg/week. Patient denies: nausea, vomiting, abdominal distention, dyspepsia, or abdominal fullness. There is no data regarding stopping Ozempic prior to surgery. Instructed the patient to skip it the week of surgery if it falls on surgery day. Discourage increasing Ozempic dose any further prior to surgery as the GI side effects may decompensate patient. - Continue Semaglutide 0.'5mg'$ /week subcutaneous injection

## 2022-04-07 NOTE — Progress Notes (Addendum)
Subjective:  CC: hip pain  HPI:  Ms.Doni Grimes is a 61 y.o. female with a past medical history stated below and presents today for presurgical evaluation for L hip arthroplasty in the setting of L hip OA that has failed conservative management. Please see problem based assessment and plan for additional details.  Past Medical History:  Diagnosis Date   Anemia    Anxiety    Arthritis    Back pain    Bipolar depression ([place]) 09/2015   Constipation    Depression 06/27/2021   Dizziness    GERD (gastroesophageal reflux disease)    History of colon polyps    History of kidney stones    History of patellar fracture 2016   History of tibial fracture 2016   Hyperlipidemia    IBS (irritable bowel syndrome)    Joint pain    Night terrors    Night terrors, adult 09/2015   Osteoarthritis    Pain of right knee after injury 03/10/2018   Post concussion syndrome 12/20/2019   Pre-diabetes    Seasonal allergies 11/09/2020   Sleep apnea    doesnt wear c-pap   Vitamin B 12 deficiency    Vitamin D deficiency     Current Outpatient Medications on File Prior to Visit  Medication Sig Dispense Refill   Accu-Chek FastClix Lancets MISC use 1 strip to check blood sugar up to 7 times a week 102 each 3   acetaminophen-codeine (TYLENOL #3) 300-30 MG tablet Take 1 tablet by mouth 2 (two) times daily as needed for pain 30 tablet 0   amoxicillin (AMOXIL) 500 MG capsule Take 4 capsules (2,000 mg total) by mouth 1 hour prior to dentist or other procedure 4 capsule 3   atorvastatin (LIPITOR) 80 MG tablet Take 1 tablet (80 mg total) by mouth daily. 30 tablet 11   celecoxib (CELEBREX) 200 MG capsule Take 1 capsule (200 mg total) by mouth daily. 30 capsule 1   Cyanocobalamin (B-12 PO) Take 1,000 mcg by mouth 2 (two) times daily.     diclofenac (VOLTAREN) 75 MG EC tablet Take 1 tablet (75 mg total) by mouth 2 (two) times daily as needed. 60 tablet 1   docusate sodium (COLACE) 100 MG capsule Take 100 mg  by mouth See admin instructions. Take twice a day every other day     Flaxseed, Linseed, (FLAX SEEDS PO) Take 500 mg by mouth 2 (two) times daily.     fluticasone (FLONASE) 50 MCG/ACT nasal spray Place 1 spray into both nostrils daily as needed for allergies or rhinitis 16 g 2   glucose blood (ACCU-CHEK GUIDE) test strip USE TO CHECK BLOOD GLUCOSE UP TO 7 TIMES A WEEK. 100 strip 3   Glycerin-Hypromellose-PEG 400 (DRY EYE RELIEF DROPS) 0.2-0.2-1 % SOLN Place 1 drop into both eyes 3 (three) times daily.     lamoTRIgine (LAMICTAL) 100 MG tablet Take 1 tablet by mouth once a day 30 tablet 3   lamoTRIgine (LAMICTAL) 100 MG tablet Take 1 tablet (100 mg total) by mouth daily. 90 tablet 1   linaclotide (LINZESS) 145 MCG CAPS capsule Take 1 capsule (145 mcg total) by mouth daily before breakfast. 30 capsule 3   metFORMIN (GLUCOPHAGE-XR) 500 MG 24 hr tablet Take 1 tablet (500 mg total) by mouth daily with breakfast. 90 tablet 3   methocarbamol (ROBAXIN) 500 MG tablet Take 1 tablet (500 mg total) by mouth every 6 (six) hours as needed for spasms. 60 tablet 1  oxyCODONE (OXY IR/ROXICODONE) 5 MG immediate release tablet Take 1 tablet (5 mg total) by mouth at bedtime as needed for pain 10 tablet 0   oxyCODONE (OXY IR/ROXICODONE) 5 MG immediate release tablet Take 1 tablet (5 mg total) by mouth at bedtime as needed for pain 10 tablet 0   oxyCODONE-acetaminophen (PERCOCET/ROXICET) 5-325 MG tablet Take 1 tablet by mouth every 6 (six) hours as needed for severe pain. 15 tablet 0   Peppermint Oil (IBGARD) 90 MG CPCR Take as directed (Patient not taking: Reported on 03/17/2022)     polyethylene glycol (MIRALAX / GLYCOLAX) 17 g packet Mix 1 packet (17 g total) in 8 oz clear liquid and drink by mouth daily as needed for mild constipation. 14 each 0   prazosin (MINIPRESS) 1 MG capsule Take 2 capsules (2 mg total) by mouth at bedtime. 60 capsule 3   prazosin (MINIPRESS) 1 MG capsule Take 2 capsules (2 mg total) by mouth at  bedtime. 180 capsule 1   predniSONE (DELTASONE) 5 MG tablet Take as directed for 12 days. Take 6 tablets by mouth daily on days 1-4, take 4 tablets daily on days 5-8, and take 2 tablets by mouth daily on days 9-12. 48 tablet 0   Semaglutide,0.25 or 0.'5MG'$ /DOS, (OZEMPIC, 0.25 OR 0.5 MG/DOSE,) 2 MG/3ML SOPN Inject 0.5 mg into the skin once a week. 3 mL 0   triamcinolone cream (KENALOG) 0.1 % Apply 1 application topically daily. Apply underneath breast as needed 45 g 3   triamcinolone ointment (KENALOG) 0.5 % Apply 1 application topically daily. Apply to hands as needed daily 30 g 5   Vitamin D, Ergocalciferol, (DRISDOL) 1.25 MG (50000 UNIT) CAPS capsule Take 1 capsule (50,000 Units total) by mouth every 7 (seven) days. 5 capsule 0   No current facility-administered medications on file prior to visit.    Family History  Problem Relation Age of Onset   Hyperlipidemia Mother    Hypertension Mother    Colon polyps Mother    Heart disease Mother    Diabetes Mother    Depression Mother    Anxiety disorder Mother    Bipolar disorder Mother    Eating disorder Mother    Obesity Mother    Arthritis/Rheumatoid Father    Hyperlipidemia Maternal Aunt    Hypertension Maternal Aunt    Cancer Maternal Aunt        "blood cancer"   Colon polyps Maternal Grandmother    Stroke Other    Colon cancer Neg Hx    Esophageal cancer Neg Hx    Stomach cancer Neg Hx    Rectal cancer Neg Hx    Sleep apnea Neg Hx     Social History   Socioeconomic History   Marital status: Single    Spouse name: Not on file   Number of children: 2   Years of education: 2 years of college   Highest education level: Not on file  Occupational History   Not on file  Tobacco Use   Smoking status: Former    Packs/day: 0.25    Years: 7.00    Total pack years: 1.75    Types: Cigarettes    Quit date: 12/21/2017    Years since quitting: 4.2   Smokeless tobacco: Never  Vaping Use   Vaping Use: Never used  Substance and  Sexual Activity   Alcohol use: Yes    Comment: special occasions   Drug use: No   Sexual activity: Yes  Birth control/protection: Surgical    Comment: Celibate  Other Topics Concern   Not on file  Social History Narrative   Lives at home with her fiance.   Right-handed.   No daily use of caffeine.   Social Determinants of Health   Financial Resource Strain: Not on file  Food Insecurity: Food Insecurity Present (09/11/2021)   Hunger Vital Sign    Worried About Running Out of Food in the Last Year: Sometimes true    Ran Out of Food in the Last Year: Often true  Transportation Needs: Not on file  Physical Activity: Not on file  Stress: Not on file  Social Connections: Not on file  Intimate Partner Violence: Not on file    Review of Systems: ROS negative except for what is noted on the assessment and plan.  Objective:   Vitals:   04/07/22 0949  BP: 121/74  Pulse: 87  Temp: 98 F (36.7 C)  TempSrc: Oral  SpO2: 100%  Weight: 194 lb (88 kg)  Height: '5\' 3"'$  (1.6 m)    Physical Exam: Constitutional: well-appearing woman sitting in transport chair, in no acute distress HENT: normocephalic atraumatic, mucous membranes moist Eyes: conjunctiva non-erythematous Cardiovascular: regular rate and rhythm, no m/r/g. No JVD noted. Radial and DP pulses 2+ and symmetrical Pulmonary/Chest: normal work of breathing on room air, lungs clear to auscultation bilaterally Abdominal: soft, non-tender, non-distended MSK: normal bulk and tone. Negative bilateral straight leg raise test. No point tenderness over spine.  Antalgic gait, not sustained in the setting of pain. Neurological: alert & oriented x 3, 5/5 strength in bilateral upper extremities, 4/5 lower extremities, with pain on exam. Skin: warm and dry Psych: Sad mood and affect     Assessment & Plan:   Pain of left hip joint Patient with L hip pain with L arthroplasty planned for 9/12 with Dr. Alvan Dame at [facility]. Patient was  recently seen at ED for L hip pain with radiculopathy to lateral lower L leg. 7/18 CT hip show sclerosis of the L sacroiliac joint.  Conservative management has included oral and IM steroid injections, oral diclofenac, acetaminophen, and trial of oxycodone therapy without improvement.  Patient currently uses cane and transport chair intermittently for mobility. Patient states that this pain has limited her ability to walk, move around her house, perform ADLs and iADLs, for which she requires assistance from her family members. She is also sad that she does not get to cook as often, something that she loves. Patient reports numbness in the L leg is also bothersome, mostly at night. Patient was previously trialed on Lyrica, and amenable to trying this again. Exam today: Negative bilateral straight leg raise test. No point tenderness over spine.  Antalgic gait, not sustained in the setting of pain.  Regarding surgery readiness, patient denies chest pain, elevations in blood pressure, sob, asthma, smoking, alcohol. Patient does not have ACS, CVA, DM-requiring insulin, and has had stable Cr. Her RCRI score 0 points for cardiac complications. No further cardiac evaluation needed at this time. Patient denies FHx of bleeding disorders and denies overt bleeding today. She is not on anticoagulants. Patient with hx of moderate-severe OSA currently managed with CPAP machine; compliant.  Plan: - Continue pain medications per previous providers - Will add Lyrica 75 mg QHS PRN - Follow up with Ortho in 2 days as scheduled - No need to discontinue medications prior to surgery. Patient instructed to skip DM medications the day of surgery, otherwise, at the discretion of  Psychologist, sport and exercise.  Obesity (BMI 35.0-39.9 without comorbidity) Patient was referred to weight management clinic. Currently 194 lb and and used to be active, but this has been a problem in the setting of pain. Has gained weight with recurrent steorid courses.  Patient has been on Ozempic 0.5 mg/week. Patient denies: nausea, vomiting, abdominal distention, dyspepsia, or abdominal fullness. There is no data regarding stopping Ozempic prior to surgery. Instructed the patient to skip it the week of surgery if it falls on surgery day. Discourage increasing Ozempic dose any further prior to surgery as the GI side effects may decompensate patient. - Continue Semaglutide 0.'5mg'$ /week subcutaneous injection  Hyperlipidemia Controlled. Last LDL 63 on 10/2021, at goal. No increased muscle soreness or weakness. - continue Atorvastatin  Type II diabetes mellitus ([place]) Patient with controlled DM. Currently on Metformin 500 mg, compliant. Last A1c 11/2021: 5.9. Today patient denies: polydipsia, polyuria, urinary urgency. Denies Metformin side effects. BG at home 110-119. Will obtain repeat A1c today as patient is preparing for L hip arthroplasty and there is need to assess medication optimization - Continue metformin   Addendum: A1c today 6. No change in plans from prior.  OSA (obstructive sleep apnea) Sleep study 11/07/21 - moderate to severe obstructive sleep apnea. CPAP is recommended. Patient is using machine at home.  Patient reports improvement of fatigue, apnea episodes, and snoring since starting. BP today 121/74. Patient is followed by neurology. - Continue CPAP  - Patient should be cautioned not to drive, work at heights, or operate dangerous or heavy equipment when tired or sleepy. Review and reiteration of good sleep hygiene measures should be pursued with any patient.  Anemia Patient with normocytic anemia per previous labs on 7/18. Onset of anemia in 09/2021 post surgery, with recurrence aftet second surgery ion 10/2021 (hgb 7.9). Of note, prior GI work up 12/2020 with duodenitis and H. Pyroli with completion of therapy. EGD and colonoscopy with no evidence of bleeding. Patient currently on PO iron with constipation managed with Miralax and docusate; compliant.  Last BM yesterday. Today, patient denies over bleeding, fatigue. Recent folic acid, K87, and thyroid panel were normal. Last Hgb 11.2  on 7/18.  Recent iron studies showed stable disease; no need for acute IV iron infusion.  - no CBC as last Hgb 11.2 - Continue taking PO iron three times a week - Continue follow up iron studies in 4 months per Heme/Onc provider recommendations - Taking iron every day  Depression Patient sad and discouraged by her list of medical issues but optimistic about potential surgery. Highlighted the importance of step-by-step process and not catastrophizing. Congratulated patient on her commitment to her health and to remaining mobile and functional despite pain. Discussed establishing realistic expectations regarding her pain; that it may not go away completely and trying to work through some discomfort. Continues to be followed by psych and compliant on medications. Less nightmares since starting prazosin.  - continue lamictal, prazosin.   Patient seen with Dr. Dareen Piano

## 2022-04-07 NOTE — Assessment & Plan Note (Signed)
>>  ASSESSMENT AND PLAN FOR GENERALIZED OBESITY WRITTEN ON 04/07/2022 11:09 AM BY Romana Juniper, MD  Patient was referred to weight management clinic. Currently 194 lb and and used to be active, but this has been a problem in the setting of pain. Has gained weight with recurrent steorid courses. Patient has been on Ozempic 0.5 mg/week. Patient denies: nausea, vomiting, abdominal distention, dyspepsia, or abdominal fullness. There is no data regarding stopping Ozempic prior to surgery. Instructed the patient to skip it the week of surgery if it falls on surgery day. Discourage increasing Ozempic dose any further prior to surgery as the GI side effects may decompensate patient. - Continue Semaglutide 0.'5mg'$ /week subcutaneous injection

## 2022-04-07 NOTE — Assessment & Plan Note (Signed)
Patient with L hip pain with L arthroplasty planned for 9/12 with Dr. Alvan Dame at [facility]. Patient was recently seen at ED for L hip pain with radiculopathy to lateral lower L leg. 7/18 CT hip show sclerosis of the L sacroiliac joint.  Conservative management has included oral and IM steroid injections, oral diclofenac, acetaminophen, and trial of oxycodone therapy without improvement.  Patient currently uses cane and transport chair intermittently for mobility. Patient states that this pain has limited her ability to walk, move around her house, perform ADLs and iADLs, for which she requires assistance from her family members. She is also sad that she does not get to cook as often, something that she loves. Patient reports numbness in the L leg is also bothersome, mostly at night. Patient was previously trialed on Lyrica, and amenable to trying this again. Exam today: Negative bilateral straight leg raise test. No point tenderness over spine.  Antalgic gait, not sustained in the setting of pain.  Regarding surgery readiness, patient denies chest pain, elevations in blood pressure, sob, asthma, smoking, alcohol. Patient does not have ACS, CVA, DM-requiring insulin, and has had stable Cr. Her RCRI score 0 for cardiac complications. No further cardiac evaluation needed at this time. Patient denies FHx of bleeding disorders and denies overt bleeding today. She is not on anticoagulants. Patient with hx of moderate-severe OSA currently managed with CPAP machine; compliant.  Plan: - Continue pain medications per previous providers - Will add Lyrica 75 mg QHS PRN - Follow up with Ortho in 2 days as scheduled - No need to discontinue medications prior to surgery. Patient instructed to skip DM medications the day of surgery, otherwise, at the discretion of surgeon.

## 2022-04-07 NOTE — Assessment & Plan Note (Signed)
Patient with normocytic anemia per previous labs on 7/18. Onset of anemia in 09/2021 post surgery, with recurrence aftet second surgery ion 10/2021 (hgb 7.9). Of note, prior GI work up 12/2020 with duodenitis and H. Pyroli with completion of therapy. EGD and colonoscopy with no evidence of bleeding. Patient currently on PO iron with constipation managed with Miralax and docusate; compliant. Last BM yesterday. Today, patient denies over bleeding, fatigue. Recent folic acid, I71, and thyroid panel were normal. Last Hgb 11.2  on 7/18.  Recent iron studies showed stable disease; no need for acute IV iron infusion.  - no CBC as last Hgb 11.2 - Continue taking PO iron three times a week - Continue follow up iron studies in 4 months per Heme/Onc provider recommendations - Taking iron every day

## 2022-04-07 NOTE — Assessment & Plan Note (Addendum)
Patient with controlled DM. Currently on Metformin 500 mg, compliant. Last A1c 11/2021: 5.9. Today patient denies: polydipsia, polyuria, urinary urgency. Denies Metformin side effects. BG at home 110-119. Will obtain repeat A1c today as patient is preparing for L hip arthroplasty and there is need to assess medication optimization - Continue metformin   Addendum: A1c today 6. No change in plans from prior.

## 2022-04-07 NOTE — Assessment & Plan Note (Signed)
Patient sad and discouraged by her list of medical issues but optimistic about potential surgery. Highlighted the importance of step-by-step process and not catastrophizing. Congratulated patient on her commitment to her health and to remaining mobile and functional despite pain. Discussed establishing realistic expectations regarding her pain; that it may not go away completely and trying to work through some discomfort. Continues to be followed by psych and compliant on medications. Less nightmares since starting prazosin.  - continue lamictal, prazosin.

## 2022-04-07 NOTE — Patient Instructions (Signed)
Thank you, [name] for allowing [place] to provide your care today. Today we discussed your upcoming hip surgery. We went over how to think about this pain and how to increase your mobility. We discussed your medications and how we don't want you to be sleepy, so to watch out for that when thinking about pain control.  Today we are prescribing you again with Lyrica, 75 mg at night.   Regarding your diabetes, we are checking your A1c today. You have been on Metformin and Ozempic without side effects. We suspect your A1c is stable, but will call you if this is not the case. We think you can continue your medications up until surgery. You usually take your Ozempic on Wednesdays and your surgery is planned for 9/12, a Tuesday. You can skip Metformin on the day of your surgery. It is okay to postpone your Ozempic until later that week or after you're discharged from the hospital.   You had some blood counts done recently and your anemia is stable. Continue taking your iron pills and making sure you have bowel movements.   I have ordered the following labs for you:   Lab Orders         POC Hbg A1C      I will call if any are abnormal. All of your labs can be accessed through "My Chart".   My Chart Access: [email or web address]  Please follow-up in in the next few weeks or after your surgery.  Please make sure to arrive 15 minutes prior to your next appointment. If you arrive late, you may be asked to reschedule.    We look forward to seeing you next time. Please call our clinic at [phone] if you have any questions or concerns. The best time to call is Monday-Friday from 9am-4pm, but there is someone available 24/7. If after hours or the weekend, call the main hospital number and ask for the Internal Medicine Resident On-Call. If you need medication refills, please notify your pharmacy one week in advance and they will send [place] a request.   Thank you  for letting us take part in your care. Wishing you the best!  Meghan Juniper, MD 04/07/2022, 10:34 AM [facility] Internal [facility] Resident, PGY-1

## 2022-04-07 NOTE — Assessment & Plan Note (Addendum)
Sleep study 11/07/21 - moderate to severe obstructive sleep apnea. CPAP is recommended. Patient is using machine at home.  Patient reports improvement of fatigue, apnea episodes, and snoring since starting. BP today 121/74. Patient is followed by neurology. - Continue CPAP  - Patient should be cautioned not to drive, work at heights, or operate dangerous or heavy equipment when tired or sleepy. Review and reiteration of good sleep hygiene measures should be pursued with any patient.

## 2022-04-07 NOTE — Assessment & Plan Note (Signed)
Controlled. Last LDL 63 on 10/2021, at goal. No increased muscle soreness or weakness. - continue Atorvastatin

## 2022-04-09 ENCOUNTER — Encounter (INDEPENDENT_AMBULATORY_CARE_PROVIDER_SITE_OTHER): Payer: Self-pay

## 2022-04-09 NOTE — Progress Notes (Signed)
Internal Medicine Clinic Attending  I saw and evaluated the patient.  I personally confirmed the key portions of the history and exam documented by Dr. Gomez-Caraballo and I reviewed pertinent patient test results.  The assessment, diagnosis, and plan were formulated together and I agree with the documentation in the resident's note.  

## 2022-04-10 ENCOUNTER — Other Ambulatory Visit (HOSPITAL_COMMUNITY): Payer: Self-pay

## 2022-04-10 ENCOUNTER — Ambulatory Visit (INDEPENDENT_AMBULATORY_CARE_PROVIDER_SITE_OTHER): Payer: Medicaid Other | Admitting: Family Medicine

## 2022-04-10 ENCOUNTER — Encounter (INDEPENDENT_AMBULATORY_CARE_PROVIDER_SITE_OTHER): Payer: Self-pay | Admitting: Family Medicine

## 2022-04-10 VITALS — BP 118/82 | HR 98 | Temp 97.8°F | Ht 63.0 in | Wt 189.0 lb

## 2022-04-10 DIAGNOSIS — G4733 Obstructive sleep apnea (adult) (pediatric): Secondary | ICD-10-CM | POA: Diagnosis not present

## 2022-04-10 DIAGNOSIS — M1612 Unilateral primary osteoarthritis, left hip: Secondary | ICD-10-CM

## 2022-04-10 DIAGNOSIS — Z6833 Body mass index (BMI) 33.0-33.9, adult: Secondary | ICD-10-CM

## 2022-04-10 DIAGNOSIS — E669 Obesity, unspecified: Secondary | ICD-10-CM

## 2022-04-10 DIAGNOSIS — E1169 Type 2 diabetes mellitus with other specified complication: Secondary | ICD-10-CM

## 2022-04-10 DIAGNOSIS — Z7985 Long-term (current) use of injectable non-insulin antidiabetic drugs: Secondary | ICD-10-CM

## 2022-04-10 DIAGNOSIS — Z9989 Dependence on other enabling machines and devices: Secondary | ICD-10-CM

## 2022-04-10 DIAGNOSIS — F3131 Bipolar disorder, current episode depressed, mild: Secondary | ICD-10-CM | POA: Diagnosis not present

## 2022-04-10 DIAGNOSIS — Z7984 Long term (current) use of oral hypoglycemic drugs: Secondary | ICD-10-CM

## 2022-04-10 MED ORDER — VITAMIN D (ERGOCALCIFEROL) 1.25 MG (50000 UNIT) PO CAPS
50000.0000 [IU] | ORAL_CAPSULE | ORAL | 0 refills | Status: DC
  Filled 2022-04-10: qty 5, 35d supply, fill #0

## 2022-04-10 MED ORDER — OZEMPIC (0.25 OR 0.5 MG/DOSE) 2 MG/3ML ~~LOC~~ SOPN
0.5000 mg | PEN_INJECTOR | SUBCUTANEOUS | 0 refills | Status: DC
  Filled 2022-04-10 – 2022-05-01 (×3): qty 3, 28d supply, fill #0

## 2022-04-14 ENCOUNTER — Other Ambulatory Visit (HOSPITAL_COMMUNITY): Payer: Self-pay

## 2022-04-21 NOTE — Progress Notes (Unsigned)
Chief Complaint:   OBESITY Meghan Wilson is here to discuss her progress with her obesity treatment plan along with follow-up of her obesity related diagnoses. [name] is on the Category 2 Plan and states she is following her eating plan approximately 0% of the time. [name] states she is doing 0 minutes 0 times per week.  Today's visit was #: 8 Starting weight: 195 lbs Starting date: 11/21/2021 Today's weight: 189 lbs Today's date: 04/10/2022 Total lbs lost to date: 6 Total lbs lost since last in-office visit: 0  Interim History: Meghan Wilson's left hip pain limits exercise and she is doing some stress eating.  She is trying not to skip meals, and she is scheduled for a hip replacement on 05/13/2022.  She drinks protein drinks and some fruits and veggies.  Her husband is starting to eat better.  Subjective:   1. Osteoarthritis of left hip, unspecified osteoarthritis type Meghan Wilson is scheduled for a left total hip arthroplasty on 05/13/2022 with Dr. Alvan Dame.  She limits exercise due to pain.  2. Type 2 diabetes mellitus with other specified complication, without long-term current use of insulin (HCC) Meghan Wilson is on Ozempic 0.5 mg once weekly and metformin 500 mg XR once daily.  She feels adequate satiety without adverse side effects.  Her A1c was 6.0 on 04/07/2022, stable.  3. OSA on CPAP [name] is having problems with her CPAP machine.  She is wearing her mask with no problems but downloads are not recording.  4. Bipolar affective disorder, currently depressed, mild ([place]) [name] is on Lamictal and she notes it is helping.  Her pain has worsened her mood but Tylenol 3 helps.  She denies mood instability.  She notes some emotional eating present.  Assessment/Plan:   1. Osteoarthritis of left hip, unspecified osteoarthritis type [name] will continue activity as tolerated, and anticipate improvements after physical therapy.  2. Type 2 diabetes mellitus with other specified complication, without long-term  current use of insulin ([place]) [name] will continue Ozempic 0.5 mg once weekly, and we will refill for 1 month.  - Semaglutide,0.25 or 0.'5MG'$ /DOS, (OZEMPIC, 0.25 OR 0.5 MG/DOSE,) 2 MG/3ML SOPN; Inject 0.5 mg into the skin once a week.  Dispense: 3 mL; Refill: 0  3. OSA on CPAP Meghan Wilson will continue her CPAP nightly with 7 to 8 hours of sleep.  4. Bipolar affective disorder, currently depressed, mild ([place]) Hang is to continue her medications per psych.  She is to eat on a schedule, keep junk food out of sight, and practice mindful eating.  5. Obesity, Current BMI 33.6 Meghan Wilson is currently in the action stage of change. As such, her goal is to continue with weight loss efforts. She has agreed to the Category 2 Plan with 90 grams of protein.   Left hip replacement scheduled in September, and plans to increase exercise after.   Exercise goals: As is.   Behavioral modification strategies: increasing lean protein intake, increasing water intake, decreasing liquid calories, decreasing eating out, no skipping meals, better snacking choices, and decreasing junk food.  Meghan Wilson has agreed to follow-up with our clinic in 4 weeks. She was informed of the importance of frequent follow-up visits to maximize her success with intensive lifestyle modifications for her multiple health conditions.   Objective:   Blood pressure 118/82, pulse 98, temperature 97.8 F (36.6 C), height '5\' 3"'$  (1.6 m), weight 189 lb (85.7 kg), SpO2 96 %. Body mass index is 33.48 kg/m.  General: Cooperative, alert, well developed, in no acute distress.  HEENT: Conjunctivae and lids unremarkable. Cardiovascular: Regular rhythm.  Lungs: Normal work of breathing. Neurologic: No focal deficits.   Lab Results  Component Value Date   CREATININE 0.60 03/18/2022   BUN 26 (H) 03/18/2022   NA 141 03/18/2022   K 4.1 03/18/2022   CL 110 03/18/2022   CO2 25 03/18/2022   Lab Results  Component Value Date   ALT 24 03/18/2022   AST 13  (L) 03/18/2022   ALKPHOS 71 03/18/2022   BILITOT 0.1 (L) 03/18/2022   Lab Results  Component Value Date   HGBA1C 6.0 (A) 04/07/2022   HGBA1C 5.9 (A) 12/18/2021   HGBA1C 6.2 (A) 08/22/2021   HGBA1C 6.3 (A) 05/02/2021   HGBA1C 5.9 (A) 10/12/2020   Lab Results  Component Value Date   INSULIN 5.8 11/21/2021   Lab Results  Component Value Date   TSH 0.976 11/21/2021   Lab Results  Component Value Date   CHOL 137 11/21/2021   HDL 57 11/21/2021   LDLCALC 63 11/21/2021   TRIG 87 11/21/2021   CHOLHDL 2.7 05/02/2021   Lab Results  Component Value Date   VD25OH 34.2 11/21/2021   Lab Results  Component Value Date   WBC 8.7 03/18/2022   HGB 11.2 (L) 03/18/2022   HCT 37.0 03/18/2022   MCV 81.0 03/18/2022   PLT 299 03/18/2022   Lab Results  Component Value Date   IRON 81 03/05/2022   TIBC 392 03/05/2022   FERRITIN 68 03/05/2022   Attestation Statements:   Reviewed by clinician on day of visit: allergies, medications, problem list, medical history, surgical history, family history, social history, and previous encounter notes.   Wilhemena Durie, am acting as transcriptionist for Loyal Gambler, DO.  I have reviewed the above documentation for accuracy and completeness, and I agree with the above. Dell Ponto, DO

## 2022-04-23 ENCOUNTER — Other Ambulatory Visit (HOSPITAL_COMMUNITY): Payer: Self-pay

## 2022-04-23 MED ORDER — ACETAMINOPHEN-CODEINE 300-30 MG PO TABS
1.0000 | ORAL_TABLET | Freq: Two times a day (BID) | ORAL | 0 refills | Status: DC | PRN
  Filled 2022-04-23 – 2022-04-25 (×5): qty 30, 15d supply, fill #0

## 2022-04-24 ENCOUNTER — Other Ambulatory Visit (HOSPITAL_COMMUNITY): Payer: Self-pay

## 2022-04-25 ENCOUNTER — Other Ambulatory Visit (HOSPITAL_COMMUNITY): Payer: Self-pay

## 2022-05-01 ENCOUNTER — Other Ambulatory Visit (HOSPITAL_COMMUNITY): Payer: Self-pay

## 2022-05-02 ENCOUNTER — Other Ambulatory Visit (HOSPITAL_COMMUNITY): Payer: Self-pay

## 2022-05-06 NOTE — Patient Instructions (Signed)
SURGICAL WAITING ROOM VISITATION Patients having surgery or a procedure may have no more than 2 support people in the waiting area - these visitors may rotate.   Children under the age of 8 must have an adult with them who is not the patient. If the patient needs to stay at the hospital during part of their recovery, the visitor guidelines for inpatient rooms apply. Pre-op nurse will coordinate an appropriate time for 1 support person to accompany patient in pre-op.  This support person may not rotate.    Please refer to the [facility] website for the visitor guidelines for Inpatients (after your surgery is over and you are in a regular room).       Your procedure is scheduled on:  05/13/2022    Report to [facility] Main Entrance    Report to admitting at  1200 noon    Call this number if you have problems the morning of surgery [phone]   Do not eat food :After Midnight.   After Midnight you may have the following liquids until __ 1115____ AM DAY OF SURGERY  Water Non-Citrus Juices (without pulp, NO RED) Carbonated Beverages Black Coffee (NO MILK/CREAM OR CREAMERS, sugar ok)  Clear Tea (NO MILK/CREAM OR CREAMERS, sugar ok) regular and decaf                             Plain Jell-O (NO RED)                                           Fruit ices (not with fruit pulp, NO RED)                                     Popsicles (NO RED)                                                               Sports drinks like Gatorade (NO RED)                   The day of surgery:  Drink ONE (1) Pre-Surgery Clear Ensure or G2 at  1115  AM  ( be completed by ) the morning of surgery. Drink in one sitting. Do not sip.  This drink was given to you during your hospital  pre-op appointment visit. Nothing else to drink after completing the  Pre-Surgery Clear Ensure or G2.           Oral Hygiene is also important to reduce your risk of infection.                                     Remember - BRUSH YOUR TEETH THE MORNING OF SURGERY WITH YOUR REGULAR TOOTHPASTE   Do NOT smoke after Midnight   Take these medicines the morning of surgery with A SIP OF WATER:  lamictal   DO NOT TAKE ANY ORAL DIABETIC MEDICATIONS DAY OF YOUR SURGERY  Bring CPAP mask and  tubing day of surgery.                              You may not have any metal on your body including hair pins, jewelry, and body piercing             Do not wear make-up, lotions, powders, perfumes/cologne, or deodorant  Do not wear nail polish including gel and S&S, artificial/acrylic nails, or any other type of covering on natural nails including finger and toenails. If you have artificial nails, gel coating, etc. that needs to be removed by a nail salon please have this removed prior to surgery or surgery may need to be canceled/ delayed if the surgeon/ anesthesia feels like they are unable to be safely monitored.   Do not shave  48 hours prior to surgery.               Men may shave face and neck.   Do not bring valuables to the hospital. [place].   Contacts, dentures or bridgework may not be worn into surgery.   Bring small overnight bag day of surgery.   DO NOT [place]. PHARMACY WILL DISPENSE MEDICATIONS LISTED ON YOUR MEDICATION LIST TO YOU DURING YOUR ADMISSION [place]!    Patients discharged on the day of surgery will not be allowed to drive home.  Someone NEEDS to stay with you for the first 24 hours after anesthesia.   Special Instructions: Bring a copy of your healthcare power of attorney and living will documents         the day of surgery if you haven't scanned them before.              Please read over the following fact sheets you were given: IF YOU HAVE QUESTIONS ABOUT YOUR PRE-OP INSTRUCTIONS PLEASE CALL [phone]     [facility] - Preparing for Surgery Before surgery, you can play an important  role.  Because skin is not sterile, your skin needs to be as free of germs as possible.  You can reduce the number of germs on your skin by washing with CHG (chlorahexidine gluconate) soap before surgery.  CHG is an antiseptic cleaner which kills germs and bonds with the skin to continue killing germs even after washing. Please DO NOT use if you have an allergy to CHG or antibacterial soaps.  If your skin becomes reddened/irritated stop using the CHG and inform your nurse when you arrive at Short Stay. Do not shave (including legs and underarms) for at least 48 hours prior to the first CHG shower.  You may shave your face/neck. Please follow these instructions carefully:  1.  Shower with CHG Soap the night before surgery and the  morning of Surgery.  2.  If you choose to wash your hair, wash your hair first as usual with your  normal  shampoo.  3.  After you shampoo, rinse your hair and body thoroughly to remove the  shampoo.                           4.  Use CHG as you would any other liquid soap.  You can apply chg directly  to the skin and wash  Gently with a scrungie or clean washcloth.  5.  Apply the CHG Soap to your body ONLY FROM THE NECK DOWN.   Do not use on face/ open                           Wound or open sores. Avoid contact with eyes, ears mouth and genitals (private parts).                       Wash face,  Genitals (private parts) with your normal soap.             6.  Wash thoroughly, paying special attention to the area where your surgery  will be performed.  7.  Thoroughly rinse your body with warm water from the neck down.  8.  DO NOT shower/wash with your normal soap after using and rinsing off  the CHG Soap.                9.  Pat yourself dry with a clean towel.            10.  Wear clean pajamas.            11.  Place clean sheets on your bed the night of your first shower and do not  sleep with pets. Day of Surgery : Do not apply any lotions/deodorants  the morning of surgery.  Please wear clean clothes to the hospital/surgery center.  FAILURE TO FOLLOW THESE INSTRUCTIONS MAY RESULT IN THE CANCELLATION OF YOUR SURGERY PATIENT SIGNATURE_________________________________  NURSE SIGNATURE__________________________________  ________________________________________________________________________

## 2022-05-06 NOTE — Progress Notes (Signed)
Anesthesia Review:  PCP: Meghan Wilson, PCP on 04/10/22  PCP- Meghan Wilson  Meghan Wilson) - clearance 04/07/22 on chart  Meghan Wilson- Meghan 04/07/22  Cardiologist : none  Chest x-ray : EKG : 11/21/21  Echo : Stress test: Cardiac Cath :  Activity level: can do a flight of stairs without difficutly  Sleep Study/ CPAP : has cpap  Fasting Blood Sugar :      / Checks Blood Sugar -- times a day:   Blood Thinner/ Instructions /Last Dose: ASA / Instructions/ Last Dose :   Prediabetes- checks glucose daily  Hgba1c- 04/07/22- 6.0  AT time of preop appt pt stated at preop surgery on left hip   Surgery schedule states left hip OR consent order states right hip.  Called Meghan Wilson at [place] at time of preop and notified of above.  PT aware.  Consent order needs to state left hip.

## 2022-05-07 ENCOUNTER — Encounter (HOSPITAL_COMMUNITY): Payer: Self-pay

## 2022-05-07 ENCOUNTER — Encounter (INDEPENDENT_AMBULATORY_CARE_PROVIDER_SITE_OTHER): Payer: Self-pay | Admitting: Nurse Practitioner

## 2022-05-07 ENCOUNTER — Ambulatory Visit (INDEPENDENT_AMBULATORY_CARE_PROVIDER_SITE_OTHER): Payer: Medicaid Other | Admitting: Nurse Practitioner

## 2022-05-07 ENCOUNTER — Other Ambulatory Visit: Payer: Self-pay

## 2022-05-07 ENCOUNTER — Encounter (HOSPITAL_COMMUNITY)
Admission: RE | Admit: 2022-05-07 | Discharge: 2022-05-07 | Disposition: A | Discharge status: Home / Self Care | Payer: Medicaid Other | Source: Ambulatory Visit | Attending: Orthopedic Surgery | Admitting: Orthopedic Surgery

## 2022-05-07 VITALS — BP 103/65 | HR 95 | Temp 98.4°F | Ht 63.0 in | Wt 189.0 lb

## 2022-05-07 VITALS — BP 119/78 | HR 92 | Temp 98.5°F | Resp 16 | Ht 63.0 in | Wt 190.0 lb

## 2022-05-07 DIAGNOSIS — M1612 Unilateral primary osteoarthritis, left hip: Secondary | ICD-10-CM

## 2022-05-07 DIAGNOSIS — Z01812 Encounter for preprocedural laboratory examination: Secondary | ICD-10-CM | POA: Insufficient documentation

## 2022-05-07 DIAGNOSIS — E669 Obesity, unspecified: Secondary | ICD-10-CM

## 2022-05-07 DIAGNOSIS — M1611 Unilateral primary osteoarthritis, right hip: Secondary | ICD-10-CM | POA: Insufficient documentation

## 2022-05-07 DIAGNOSIS — Z01818 Encounter for other preprocedural examination: Secondary | ICD-10-CM

## 2022-05-07 DIAGNOSIS — Z6833 Body mass index (BMI) 33.0-33.9, adult: Secondary | ICD-10-CM

## 2022-05-07 LAB — CBC
HCT: 40.5 % (ref 36.0–46.0)
Hemoglobin: 12.4 g/dL (ref 12.0–15.0)
MCH: 26.3 pg (ref 26.0–34.0)
MCHC: 30.6 g/dL (ref 30.0–36.0)
MCV: 85.8 fL (ref 80.0–100.0)
Platelets: 329 10*3/uL (ref 150–400)
RBC: 4.72 MIL/uL (ref 3.87–5.11)
RDW: 17.7 % — ABNORMAL HIGH (ref 11.5–15.5)
WBC: 7.3 10*3/uL (ref 4.0–10.5)
nRBC: 0 % (ref 0.0–0.2)

## 2022-05-07 LAB — BASIC METABOLIC PANEL
Anion gap: 7 (ref 5–15)
BUN: 11 mg/dL (ref 8–23)
CO2: 27 mmol/L (ref 22–32)
Calcium: 9.8 mg/dL (ref 8.9–10.3)
Chloride: 108 mmol/L (ref 98–111)
Creatinine, Ser: 0.55 mg/dL (ref 0.44–1.00)
GFR, Estimated: >60 mL/min (ref >60)
Glucose, Bld: 92 mg/dL (ref 70–99)
Potassium: 4.1 mmol/L (ref 3.5–5.1)
Sodium: 142 mmol/L (ref 135–145)

## 2022-05-07 LAB — SURGICAL PCR SCREEN
MRSA, PCR: NEGATIVE
Staphylococcus aureus: POSITIVE — AB

## 2022-05-07 LAB — GLUCOSE, CAPILLARY: Glucose-Capillary: 84 mg/dL (ref 70–99)

## 2022-05-08 ENCOUNTER — Encounter: Payer: Self-pay | Admitting: Adult Health

## 2022-05-08 ENCOUNTER — Ambulatory Visit: Payer: Medicaid Other | Admitting: Adult Health

## 2022-05-08 VITALS — BP 119/75 | HR 93 | Ht 63.0 in | Wt 193.2 lb

## 2022-05-08 DIAGNOSIS — Z9989 Dependence on other enabling machines and devices: Secondary | ICD-10-CM | POA: Diagnosis not present

## 2022-05-08 DIAGNOSIS — G4733 Obstructive sleep apnea (adult) (pediatric): Secondary | ICD-10-CM

## 2022-05-08 NOTE — Progress Notes (Signed)
PATIENT: Meghan Wilson DOB: [birth year]  REASON FOR VISIT: follow up HISTORY FROM: patient PRIMARY NEUROLOGIST:   Chief Complaint  Patient presents with   Follow-up    Pt in 61  Pt here for CPAP f/u  Pt states mask is to small and doesn't fit well Pt states she gets marks in face after taking mask off      HISTORY OF PRESENT ILLNESS: Today 05/08/22:  Meghan Wilson is a 61 year old female with a history of obstructive sleep apnea on CPAP.  She returns today for her first download.  She reports that the CPAP has been working well for her.  She does feel that the mask is too small for her and it leaks throughout the night.  For that reason some nights she does not use it.  She states that the nights that she does use that she can tell a big difference in her sleep.  She would like to try a different type of mask.    REVIEW OF SYSTEMS: Out of a complete 14 system review of symptoms, the patient complains only of the following symptoms, and all other reviewed systems are negative.   ESS 22  ALLERGIES: Allergies  Allergen Reactions   Aspirin     Upsets stomach in large doses   Depakote [Divalproex Sodium] Other (See Comments)    Hair fell out   Ibuprofen Nausea And Vomiting   Prednisone Other (See Comments)    Constipation, nervousness All steroids   Latex Rash    HOME MEDICATIONS: Outpatient Medications Prior to Visit  Medication Sig Dispense Refill   Accu-Chek FastClix Lancets MISC use 1 strip to check blood sugar up to 7 times a week 102 each 3   acetaminophen-codeine (TYLENOL #3) 300-30 MG tablet Take 1 tablet by mouth 2 (two) times daily as needed for pain (Patient taking differently: Take 1 tablet by mouth in the morning and at bedtime.) 30 tablet 0   atorvastatin (LIPITOR) 80 MG tablet Take 1 tablet (80 mg total) by mouth daily. 30 tablet 11   celecoxib (CELEBREX) 200 MG capsule Take 1 capsule (200 mg total) by mouth daily. (Patient taking differently: Take 200 mg by  mouth every other day.) 30 capsule 1   Cyanocobalamin (B-12 PO) Take 1,000 mcg by mouth 2 (two) times daily.     diclofenac (VOLTAREN) 75 MG EC tablet Take 1 tablet (75 mg total) by mouth 2 (two) times daily as needed. 60 tablet 1   diphenhydrAMINE (BENADRYL) 25 MG tablet Take 50 mg by mouth daily.     docusate sodium (COLACE) 100 MG capsule Take 100 mg by mouth every other day.     ferrous sulfate 325 (65 FE) MG tablet Take 325 mg by mouth daily with breakfast.     fluticasone (FLONASE) 50 MCG/ACT nasal spray Place 1 spray into both nostrils daily as needed for allergies or rhinitis 16 g 2   glucose blood (ACCU-CHEK GUIDE) test strip USE TO CHECK BLOOD GLUCOSE UP TO 7 TIMES A WEEK. 100 strip 3   Glycerin-Hypromellose-PEG 400 (DRY EYE RELIEF DROPS) 0.2-0.2-1 % SOLN Place 1 drop into both eyes in the morning and at bedtime.     lamoTRIgine (LAMICTAL) 100 MG tablet Take 1 tablet (100 mg total) by mouth daily. 90 tablet 1   linaclotide (LINZESS) 145 MCG CAPS capsule Take 1 capsule (145 mcg total) by mouth daily before breakfast. 30 capsule 3   metFORMIN (GLUCOPHAGE-XR) 500 MG 24 hr tablet Take 1  tablet (500 mg total) by mouth daily with breakfast. 90 tablet 3   methocarbamol (ROBAXIN) 500 MG tablet Take 1 tablet (500 mg total) by mouth every 6 (six) hours as needed for spasms. 60 tablet 1   polyethylene glycol (MIRALAX / GLYCOLAX) 17 g packet Mix 1 packet (17 g total) in 8 oz clear liquid and drink by mouth daily as needed for mild constipation. 14 each 0   pregabalin (LYRICA) 75 MG capsule Take 1 capsule (75 mg total) by mouth at bedtime as needed. (Patient taking differently: Take 75 mg by mouth at bedtime.) 30 capsule 5   Semaglutide,0.25 or 0.'5MG'$ /DOS, (OZEMPIC, 0.25 OR 0.5 MG/DOSE,) 2 MG/3ML SOPN Inject 0.5 mg into the skin once a week. 3 mL 0   triamcinolone cream (KENALOG) 0.1 % Apply 1 application topically daily. Apply underneath breast as needed 45 g 3   Vitamin D, Ergocalciferol, (DRISDOL)  1.25 MG (50000 UNIT) CAPS capsule Take 1 capsule (50,000 Units total) by mouth every 7 (seven) days. 5 capsule 0   naproxen sodium (ALEVE) 220 MG tablet Take 220 mg by mouth daily as needed (pain).     oxyCODONE (OXY IR/ROXICODONE) 5 MG immediate release tablet Take 1 tablet (5 mg total) by mouth at bedtime as needed for pain (Patient not taking: Reported on 05/07/2022) 10 tablet 0   oxyCODONE (OXY IR/ROXICODONE) 5 MG immediate release tablet Take 1 tablet (5 mg total) by mouth at bedtime as needed for pain (Patient not taking: Reported on 05/07/2022) 10 tablet 0   Peppermint Oil (IBGARD) 90 MG CPCR Take as directed (Patient not taking: Reported on 05/08/2022)     prazosin (MINIPRESS) 1 MG capsule Take 2 capsules (2 mg total) by mouth at bedtime. 60 capsule 3   predniSONE (DELTASONE) 5 MG tablet Take as directed for 12 days. Take 6 tablets by mouth daily on days 1-4, take 4 tablets daily on days 5-8, and take 2 tablets by mouth daily on days 9-12. 48 tablet 0   No facility-administered medications prior to visit.    PAST MEDICAL HISTORY: Past Medical History:  Diagnosis Date   Anemia    Anxiety    Arthritis    Back pain    Bipolar depression ([place]) 09/2015   Constipation    Depression 06/27/2021   Dizziness    History of colon polyps    History of kidney stones    History of patellar fracture 2016   History of tibial fracture 2016   Hyperlipidemia    IBS (irritable bowel syndrome)    Joint pain    Night terrors    Night terrors, adult 09/2015   Osteoarthritis    Pain of right knee after injury 03/10/2018   Post concussion syndrome 12/20/2019   Pre-diabetes    Seasonal allergies 11/09/2020   Sleep apnea    uses cpap   Vitamin B 12 deficiency    Vitamin D deficiency     PAST SURGICAL HISTORY: Past Surgical History:  Procedure Laterality Date   ABDOMINAL HYSTERECTOMY     CHOLECYSTECTOMY     CLOSED REDUCTION TIBIAL FRACTURE Right 2003   COLONOSCOPY  2016   [place] Right 09/17/2021   Procedure: Removal of hardware from right tibia;  Surgeon: Paralee Cancel, MD;  Location: [facility];  Service: Orthopedics;  Laterality: Right;   MANDIBLE SURGERY  2022   TOTAL KNEE ARTHROPLASTY Right 10/17/2021   Procedure: TOTAL KNEE ARTHROPLASTY;  Surgeon: Paralee Cancel, MD;  Location: [facility];  Service: Orthopedics;  Laterality: Right;   WISDOM TOOTH EXTRACTION      FAMILY HISTORY: Family History  Problem Relation Age of Onset   Hyperlipidemia Mother    Hypertension Mother    Colon polyps Mother    Heart disease Mother    Diabetes Mother    Depression Mother    Anxiety disorder Mother    Bipolar disorder Mother    Eating disorder Mother    Obesity Mother    Arthritis/Rheumatoid Father    Hyperlipidemia Maternal Aunt    Hypertension Maternal Aunt    Cancer Maternal Aunt        "blood cancer"   Colon polyps Maternal Grandmother    Stroke Other    Colon cancer Neg Hx    Esophageal cancer Neg Hx    Stomach cancer Neg Hx    Rectal cancer Neg Hx    Sleep apnea Neg Hx     SOCIAL HISTORY: Social History   Socioeconomic History   Marital status: Single    Spouse name: Not on file   Number of children: 2   Years of education: 2 years of college   Highest education level: Not on file  Occupational History   Not on file  Tobacco Use   Smoking status: Former    Packs/day: 0.25    Years: 7.00    Total pack years: 1.75    Types: Cigarettes    Quit date: 12/21/2017    Years since quitting: 4.3   Smokeless tobacco: Never  Vaping Use   Vaping Use: Never used  Substance and Sexual Activity   Alcohol use: Yes    Comment: special occasions   Drug use: No   Sexual activity: Yes    Birth control/protection: Surgical    Comment: Celibate  Other Topics Concern   Not on file  Social History Narrative   Lives at home with her fiance.   Right-handed.   No daily use of caffeine.   Social Determinants of Health   Financial Resource Strain:  Not on file  Food Insecurity: Food Insecurity Present (09/11/2021)   Hunger Vital Sign    Worried About Running Out of Food in the Last Year: Sometimes true    Ran Out of Food in the Last Year: Often true  Transportation Needs: Not on file  Physical Activity: Not on file  Stress: Not on file  Social Connections: Not on file  Intimate Partner Violence: Not on file      PHYSICAL EXAM  Vitals:   05/08/22 1100  BP: 119/75  Pulse: 93  Weight: 193 lb 3.2 oz (87.6 kg)  Height: '5\' 3"'$  (1.6 m)   Body mass index is 34.22 kg/m.  Generalized: Well developed, in no acute distress  Chest: Lungs clear to auscultation bilaterally  Neurological examination  Mentation: Alert oriented to time, place, history taking. Follows all commands speech and language fluent Cranial nerve II-XII: Extraocular movements were full, visual field were full on confrontational test Head turning and shoulder shrug  were normal and symmetric. Gait and station: Gait is normal.    DIAGNOSTIC DATA (LABS, IMAGING, TESTING) - I reviewed patient records, labs, notes, testing and imaging myself where available.  Lab Results  Component Value Date   WBC 7.3 05/07/2022   HGB 12.4 05/07/2022   HCT 40.5 05/07/2022   MCV 85.8 05/07/2022   PLT 329 05/07/2022      Component Value Date/Time   NA 142 05/07/2022 0904  NA 138 11/21/2021 1007   K 4.1 05/07/2022 0904   CL 108 05/07/2022 0904   CO2 27 05/07/2022 0904   GLUCOSE 92 05/07/2022 0904   BUN 11 05/07/2022 0904   BUN 8 11/21/2021 1007   CREATININE 0.55 05/07/2022 0904   CALCIUM 9.8 05/07/2022 0904   PROT 6.7 03/18/2022 1520   PROT 6.6 11/21/2021 1007   ALBUMIN 3.5 03/18/2022 1520   ALBUMIN 4.3 11/21/2021 1007   AST 13 (L) 03/18/2022 1520   ALT 24 03/18/2022 1520   ALKPHOS 71 03/18/2022 1520   BILITOT 0.1 (L) 03/18/2022 1520   BILITOT 0.2 11/21/2021 1007   GFRNONAA >60 05/07/2022 0904   GFRAA 119 05/11/2020 0935   Lab Results  Component Value Date    CHOL 137 11/21/2021   HDL 57 11/21/2021   LDLCALC 63 11/21/2021   TRIG 87 11/21/2021   CHOLHDL 2.7 05/02/2021   Lab Results  Component Value Date   HGBA1C 6.0 (A) 04/07/2022   Lab Results  Component Value Date   VITAMINB12 860 11/21/2021   Lab Results  Component Value Date   TSH 0.976 11/21/2021      ASSESSMENT AND PLAN 61 y.o. year old female  has a past medical history of Anemia, Anxiety, Arthritis, Back pain, Bipolar depression ([place]) (09/2015), Constipation, Depression (06/27/2021), Dizziness, History of colon polyps, History of kidney stones, History of patellar fracture (2016), History of tibial fracture (2016), Hyperlipidemia, IBS (irritable bowel syndrome), Joint pain, Night terrors, Night terrors, adult (09/2015), Osteoarthritis, Pain of right knee after injury (03/10/2018), Post concussion syndrome (12/20/2019), Pre-diabetes, Seasonal allergies (11/09/2020), Sleep apnea, Vitamin B 12 deficiency, and Vitamin D deficiency. here with:  OSA on CPAP  - CPAP compliance suboptimal - Good treatment of AHI  - Mask refitting ordered - Encourage patient to use CPAP nightly and > 4 hours each night - F/U in 6 months or sooner if needed   Ward Givens, MSN, NP-C 05/08/2022, 11:23 AM [facility] [address] [place], [place] [address] [phone]

## 2022-05-08 NOTE — Patient Instructions (Signed)
Continue using CPAP nightly and greater than 4 hours each night °If your symptoms worsen or you develop new symptoms please let us know.  ° °

## 2022-05-09 ENCOUNTER — Other Ambulatory Visit (HOSPITAL_COMMUNITY): Payer: Self-pay

## 2022-05-12 NOTE — Progress Notes (Unsigned)
Chief Complaint:   OBESITY Meghan Wilson is here to discuss her progress with her obesity treatment plan along with follow-up of her obesity related diagnoses. Meghan Wilson is on the Category 2 Plan and states she is following her eating plan approximately 50% of the time. Meghan Wilson states she is exercising 0 minutes 0 times per week.  Today's visit was #: 9 Starting weight: 195 lbs Starting date: 11/21/2021 Today's weight: 189 lbs Today's date: 05/07/2022 Total lbs lost to date: 6 lbs Total lbs lost since last in-office visit: 0  Interim History: Meghan Wilson has maintained her weight since 03/17/22. She is scheduled for a hip replacement on 05/13/2022. Staying in the hospital one night. She is drinking a protein shake with her breakfast in the morning. Drinking water daily. Her husband is taking a week off from work and more if need to help after surgery.  Subjective:   1. Osteoarthritis of left hip, unspecified osteoarthritis type Meghan Wilson is scheduled for surgery next week. Walks with the aide of a cane.  Assessment/Plan:   1. Osteoarthritis of left hip, unspecified osteoarthritis type Meghan Wilson will continue to follow up with ortho. Will skip Ozempic dose scheduled Wednesday after surgery. Increase water and protein intake.  2. Obesity, Current BMI 33.6 Meghan Wilson is currently in the action stage of change. As such, her goal is to continue with weight loss efforts. She has agreed to the Category 2 Plan.   Exercise goals: All adults should avoid inactivity. Some physical activity is better than none, and adults who participate in any amount of physical activity gain some health benefits.  Behavioral modification strategies: meal planning and cooking strategies, keeping healthy foods in the home, and planning for success.  Meghan Wilson has agreed to follow-up with our clinic in 6 weeks. She was informed of the importance of frequent follow-up visits to maximize her success with intensive lifestyle modifications  for her multiple health conditions.   Objective:   Blood pressure 103/65, pulse 95, temperature 98.4 F (36.9 C), height '5\' 3"'$  (1.6 m), weight 189 lb (85.7 kg), SpO2 99 %. Body mass index is 33.48 kg/m.  General: Cooperative, alert, well developed, in no acute distress. HEENT: Conjunctivae and lids unremarkable. Cardiovascular: Regular rhythm.  Lungs: Normal work of breathing. Neurologic: No focal deficits.   Lab Results  Component Value Date   CREATININE 0.55 05/07/2022   BUN 11 05/07/2022   NA 142 05/07/2022   K 4.1 05/07/2022   CL 108 05/07/2022   CO2 27 05/07/2022   Lab Results  Component Value Date   ALT 24 03/18/2022   AST 13 (L) 03/18/2022   ALKPHOS 71 03/18/2022   BILITOT 0.1 (L) 03/18/2022   Lab Results  Component Value Date   HGBA1C 6.0 (A) 04/07/2022   HGBA1C 5.9 (A) 12/18/2021   HGBA1C 6.2 (A) 08/22/2021   HGBA1C 6.3 (A) 05/02/2021   HGBA1C 5.9 (A) 10/12/2020   Lab Results  Component Value Date   INSULIN 5.8 11/21/2021   Lab Results  Component Value Date   TSH 0.976 11/21/2021   Lab Results  Component Value Date   CHOL 137 11/21/2021   HDL 57 11/21/2021   LDLCALC 63 11/21/2021   TRIG 87 11/21/2021   CHOLHDL 2.7 05/02/2021   Lab Results  Component Value Date   VD25OH 34.2 11/21/2021   Lab Results  Component Value Date   WBC 7.3 05/07/2022   HGB 12.4 05/07/2022   HCT 40.5 05/07/2022   MCV 85.8 05/07/2022   PLT  329 05/07/2022   Lab Results  Component Value Date   IRON 81 03/05/2022   TIBC 392 03/05/2022   FERRITIN 68 03/05/2022   Attestation Statements:   Reviewed by clinician on day of visit: allergies, medications, problem list, medical history, surgical history, family history, social history, and previous encounter notes.  Time spent on visit including pre-visit chart review and post-visit care and charting was 30 minutes.   I, Brendell Tyus, RMA, am acting as transcriptionist for Everardo Pacific, FNP.  I have reviewed  the above documentation for accuracy and completeness, and I agree with the above. Everardo Pacific, FNP

## 2022-05-12 NOTE — H&P (Signed)
TOTAL HIP ADMISSION H&P  Patient is admitted for left total hip arthroplasty.  Subjective:  Chief Complaint: left hip pain  HPI: Meghan Wilson, 61 y.o. female, has a history of pain and functional disability in the left hip(s) due to arthritis and patient has failed non-surgical conservative treatments for greater than 12 weeks to include NSAID's and/or analgesics and activity modification.  Onset of symptoms was gradual starting 2 years ago with gradually worsening course since that time.The patient noted no past surgery on the left hip(s).  Patient currently rates pain in the left hip at 8 out of 10 with activity. Patient has worsening of pain with activity and weight bearing, pain that interfers with activities of daily living, and pain with passive range of motion. Patient has evidence of joint space narrowing by imaging studies. This condition presents safety issues increasing the risk of falls.   There is no current active infection.  Patient Active Problem List   Diagnosis Date Noted   OSA (obstructive sleep apnea) 04/07/2022   Anemia 04/07/2022   Pain of left hip joint 02/17/2022   Shortened PR interval 11/21/2021   S/P total knee arthroplasty, 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   Chronic knee pain after total replacement of right knee joint 10/02/2021   S/P hardware removal, right tibia 09/17/2021   Pain in joint of right shoulder 09/16/2021   Aortic atherosclerosis ([place]) 07/08/2021   Depression 06/27/2021   Obesity (BMI 35.0-39.9 without comorbidity) 06/27/2021   Generalized abdominal discomfort 05/06/2021   Vaginal atrophy 11/09/2020   Type II diabetes mellitus ([place]) 02/16/2019   Healthcare maintenance 01/02/2018   Hyperlipidemia 01/01/2018   Peptic gastritis 01/01/2018   Bipolar disorder ([place]) 08/03/2015   Past Medical History:  Diagnosis Date   Anemia    Anxiety    Arthritis    Back pain    Bipolar depression ([place]) 09/2015   Constipation    Depression 06/27/2021   Dizziness     History of colon polyps    History of kidney stones    History of patellar fracture 2016   History of tibial fracture 2016   Hyperlipidemia    IBS (irritable bowel syndrome)    Joint pain    Night terrors    Night terrors, adult 09/2015   Osteoarthritis    Pain of right knee after injury 03/10/2018   Post concussion syndrome 12/20/2019   Pre-diabetes    Seasonal allergies 11/09/2020   Sleep apnea    uses cpap   Vitamin B 12 deficiency    Vitamin D deficiency     Past Surgical History:  Procedure Laterality Date   ABDOMINAL HYSTERECTOMY     CHOLECYSTECTOMY     CLOSED REDUCTION TIBIAL FRACTURE Right 2003   COLONOSCOPY  2016   [place] Right 09/17/2021   Procedure: Removal of hardware from right tibia;  Surgeon: Paralee Cancel, MD;  Location: [facility];  Service: Orthopedics;  Laterality: Right;   MANDIBLE SURGERY  2022   TOTAL KNEE ARTHROPLASTY Right 10/17/2021   Procedure: TOTAL KNEE ARTHROPLASTY;  Surgeon: Paralee Cancel, MD;  Location: [facility];  Service: Orthopedics;  Laterality: Right;   WISDOM TOOTH EXTRACTION      No current facility-administered medications for this encounter.   Current Outpatient Medications  Medication Sig Dispense Refill Last Dose   acetaminophen-codeine (TYLENOL #3) 300-30 MG tablet Take 1 tablet by mouth 2 (two) times daily as needed for pain (Patient taking differently: Take 1 tablet by mouth in the  morning and at bedtime.) 30 tablet 0    atorvastatin (LIPITOR) 80 MG tablet Take 1 tablet (80 mg total) by mouth daily. 30 tablet 11    celecoxib (CELEBREX) 200 MG capsule Take 1 capsule (200 mg total) by mouth daily. (Patient taking differently: Take 200 mg by mouth every other day.) 30 capsule 1    Cyanocobalamin (B-12 PO) Take 1,000 mcg by mouth 2 (two) times daily.      diphenhydrAMINE (BENADRYL) 25 MG tablet Take 50 mg by mouth daily.      docusate sodium (COLACE) 100 MG capsule Take 100 mg by mouth every other day.      ferrous  sulfate 325 (65 FE) MG tablet Take 325 mg by mouth daily with breakfast.      fluticasone (FLONASE) 50 MCG/ACT nasal spray Place 1 spray into both nostrils daily as needed for allergies or rhinitis 16 g 2    Glycerin-Hypromellose-PEG 400 (DRY EYE RELIEF DROPS) 0.2-0.2-1 % SOLN Place 1 drop into both eyes in the morning and at bedtime.      lamoTRIgine (LAMICTAL) 100 MG tablet Take 1 tablet (100 mg total) by mouth daily. 90 tablet 1    linaclotide (LINZESS) 145 MCG CAPS capsule Take 1 capsule (145 mcg total) by mouth daily before breakfast. 30 capsule 3    metFORMIN (GLUCOPHAGE-XR) 500 MG 24 hr tablet Take 1 tablet (500 mg total) by mouth daily with breakfast. 90 tablet 3    methocarbamol (ROBAXIN) 500 MG tablet Take 1 tablet (500 mg total) by mouth every 6 (six) hours as needed for spasms. 60 tablet 1    naproxen sodium (ALEVE) 220 MG tablet Take 220 mg by mouth daily as needed (pain).      polyethylene glycol (MIRALAX / GLYCOLAX) 17 g packet Mix 1 packet (17 g total) in 8 oz clear liquid and drink by mouth daily as needed for mild constipation. 14 each 0    pregabalin (LYRICA) 75 MG capsule Take 1 capsule (75 mg total) by mouth at bedtime as needed. (Patient taking differently: Take 75 mg by mouth at bedtime.) 30 capsule 5    Semaglutide,0.25 or 0.'5MG'$ /DOS, (OZEMPIC, 0.25 OR 0.5 MG/DOSE,) 2 MG/3ML SOPN Inject 0.5 mg into the skin once a week. 3 mL 0    triamcinolone cream (KENALOG) 0.1 % Apply 1 application topically daily. Apply underneath breast as needed 45 g 3    Vitamin D, Ergocalciferol, (DRISDOL) 1.25 MG (50000 UNIT) CAPS capsule Take 1 capsule (50,000 Units total) by mouth every 7 (seven) days. 5 capsule 0    Accu-Chek FastClix Lancets MISC use 1 strip to check blood sugar up to 7 times a week 102 each 3    diclofenac (VOLTAREN) 75 MG EC tablet Take 1 tablet (75 mg total) by mouth 2 (two) times daily as needed. 60 tablet 1 Not Taking   glucose blood (ACCU-CHEK GUIDE) test strip USE TO CHECK  BLOOD GLUCOSE UP TO 7 TIMES A WEEK. 100 strip 3    oxyCODONE (OXY IR/ROXICODONE) 5 MG immediate release tablet Take 1 tablet (5 mg total) by mouth at bedtime as needed for pain (Patient not taking: Reported on 05/07/2022) 10 tablet 0 Not Taking   oxyCODONE (OXY IR/ROXICODONE) 5 MG immediate release tablet Take 1 tablet (5 mg total) by mouth at bedtime as needed for pain (Patient not taking: Reported on 05/07/2022) 10 tablet 0 Not Taking   Peppermint Oil (IBGARD) 90 MG CPCR Take as directed (Patient not taking: Reported on  05/08/2022)   Not Taking   prazosin (MINIPRESS) 1 MG capsule Take 2 capsules (2 mg total) by mouth at bedtime. 60 capsule 3 Not Taking   predniSONE (DELTASONE) 5 MG tablet Take as directed for 12 days. Take 6 tablets by mouth daily on days 1-4, take 4 tablets daily on days 5-8, and take 2 tablets by mouth daily on days 9-12. 48 tablet 0 Not Taking   Allergies  Allergen Reactions   Aspirin     Upsets stomach in large doses   Depakote [Divalproex Sodium] Other (See Comments)    Hair fell out   Ibuprofen Nausea And Vomiting   Prednisone Other (See Comments)    Constipation, nervousness All steroids   Latex Rash    Social History   Tobacco Use   Smoking status: Former    Packs/day: 0.25    Years: 7.00    Total pack years: 1.75    Types: Cigarettes    Quit date: 12/21/2017    Years since quitting: 4.3   Smokeless tobacco: Never  Substance Use Topics   Alcohol use: Yes    Comment: special occasions    Family History  Problem Relation Age of Onset   Hyperlipidemia Mother    Hypertension Mother    Colon polyps Mother    Heart disease Mother    Diabetes Mother    Depression Mother    Anxiety disorder Mother    Bipolar disorder Mother    Eating disorder Mother    Obesity Mother    Arthritis/Rheumatoid Father    Hyperlipidemia Maternal Aunt    Hypertension Maternal Aunt    Cancer Maternal Aunt        "blood cancer"   Colon polyps Maternal Grandmother    Stroke  Other    Colon cancer Neg Hx    Esophageal cancer Neg Hx    Stomach cancer Neg Hx    Rectal cancer Neg Hx    Sleep apnea Neg Hx      Review of Systems  Constitutional:  Negative for chills and fever.  Respiratory:  Negative for cough and shortness of breath.   Cardiovascular:  Negative for chest pain.  Gastrointestinal:  Negative for nausea and vomiting.  Musculoskeletal:  Positive for arthralgias.     Objective:  Physical Exam Well nourished and well developed. General: Alert and oriented x3, cooperative and pleasant, no acute distress. Head: normocephalic, atraumatic, neck supple. Eyes: EOMI.  Musculoskeletal: Left hip exam: Painful and limited hip flexion internal rotation to 10 degrees with external rotation close to 30 degrees Painful and limited active hip flexion with 5 -/5 strength due to pain No lower extremity edema, erythema or calf tenderness  Calves soft and nontender. Motor function intact in LE. Strength 5/5 LE bilaterally. Neuro: Distal pulses 2+. Sensation to light touch intact in LE.  Vital signs in last 24 hours:    Labs:   Estimated body mass index is 34.22 kg/m as calculated from the following:   Height as of 05/08/22: '5\' 3"'$  (1.6 m).   Weight as of 05/08/22: 87.6 kg.   Imaging Review Plain radiographs demonstrate severe degenerative joint disease of the left hip(s). The bone quality appears to be adequate for age and reported activity level.      Assessment/Plan:  End stage arthritis, left hip(s)  The patient history, physical examination, clinical judgement of the provider and imaging studies are consistent with end stage degenerative joint disease of the left hip(s) and total hip arthroplasty is  deemed medically necessary. The treatment options including medical management, injection therapy, arthroscopy and arthroplasty were discussed at length. The risks and benefits of total hip arthroplasty were presented and reviewed. The risks due to  aseptic loosening, infection, stiffness, dislocation/subluxation,  thromboembolic complications and other imponderables were discussed.  The patient acknowledged the explanation, agreed to proceed with the plan and consent was signed. Patient is being admitted for inpatient treatment for surgery, pain control, PT, OT, prophylactic antibiotics, VTE prophylaxis, progressive ambulation and ADL's and discharge planning.The patient is planning to be discharged  home.  Therapy Plans: HEP Disposition: Home with Sonia Side (fiance) and granddaughter the second week Planned DVT Prophylaxis: aspirin '81mg'$  BID DME needed: none PCP: Dr. Humphrey Rolls, clearance received TXA: IV Allergies: depakote - hair loss, latex - rash, prednisone - stomach irritation Anesthesia Concerns: none BMI: 35.7 Last HgbA1c: 6.6%   Other: - oxycodone (pain control issues), robaxin, tylenol - no NSAIDs - stomach issues   Costella Hatcher, PA-C Orthopedic Surgery [facility] [place] [phone]

## 2022-05-13 ENCOUNTER — Ambulatory Visit (HOSPITAL_COMMUNITY): Payer: Medicaid Other

## 2022-05-13 ENCOUNTER — Ambulatory Visit (HOSPITAL_BASED_OUTPATIENT_CLINIC_OR_DEPARTMENT_OTHER): Payer: Medicaid Other | Admitting: Certified Registered Nurse Anesthetist

## 2022-05-13 ENCOUNTER — Other Ambulatory Visit: Payer: Self-pay

## 2022-05-13 ENCOUNTER — Observation Stay (HOSPITAL_COMMUNITY)
Admission: RE | Admit: 2022-05-13 | Discharge: 2022-05-14 | Disposition: A | Discharge status: Home / Self Care | Payer: Medicaid Other | Source: Ambulatory Visit | Attending: Orthopedic Surgery | Admitting: Orthopedic Surgery

## 2022-05-13 ENCOUNTER — Encounter (HOSPITAL_COMMUNITY): Payer: Self-pay | Admitting: Orthopedic Surgery

## 2022-05-13 ENCOUNTER — Ambulatory Visit (HOSPITAL_COMMUNITY): Payer: Medicaid Other | Admitting: Physician Assistant

## 2022-05-13 ENCOUNTER — Observation Stay (HOSPITAL_COMMUNITY): Payer: Medicaid Other

## 2022-05-13 ENCOUNTER — Encounter (HOSPITAL_COMMUNITY)
Admission: RE | Disposition: A | Discharge status: Home / Self Care | Payer: Self-pay | Source: Ambulatory Visit | Attending: Orthopedic Surgery

## 2022-05-13 DIAGNOSIS — Z79899 Other long term (current) drug therapy: Secondary | ICD-10-CM | POA: Diagnosis not present

## 2022-05-13 DIAGNOSIS — E119 Type 2 diabetes mellitus without complications: Secondary | ICD-10-CM | POA: Diagnosis not present

## 2022-05-13 DIAGNOSIS — M1612 Unilateral primary osteoarthritis, left hip: Principal | ICD-10-CM | POA: Insufficient documentation

## 2022-05-13 DIAGNOSIS — Z87891 Personal history of nicotine dependence: Secondary | ICD-10-CM | POA: Insufficient documentation

## 2022-05-13 DIAGNOSIS — Z7984 Long term (current) use of oral hypoglycemic drugs: Secondary | ICD-10-CM | POA: Insufficient documentation

## 2022-05-13 DIAGNOSIS — Z91040 Latex allergy status: Secondary | ICD-10-CM | POA: Diagnosis not present

## 2022-05-13 DIAGNOSIS — M1611 Unilateral primary osteoarthritis, right hip: Secondary | ICD-10-CM

## 2022-05-13 DIAGNOSIS — Z01818 Encounter for other preprocedural examination: Secondary | ICD-10-CM

## 2022-05-13 DIAGNOSIS — Z96651 Presence of right artificial knee joint: Secondary | ICD-10-CM | POA: Insufficient documentation

## 2022-05-13 DIAGNOSIS — Z96642 Presence of left artificial hip joint: Secondary | ICD-10-CM

## 2022-05-13 HISTORY — PX: TOTAL HIP ARTHROPLASTY: SHX124

## 2022-05-13 HISTORY — DX: Presence of left artificial hip joint: Z96.642

## 2022-05-13 LAB — TYPE AND SCREEN
ABO/RH(D): A POS
Antibody Screen: NEGATIVE

## 2022-05-13 LAB — GLUCOSE, CAPILLARY
Glucose-Capillary: 87 mg/dL (ref 70–99)
Glucose-Capillary: 184 mg/dL — ABNORMAL HIGH (ref 70–99)

## 2022-05-13 SURGERY — ARTHROPLASTY, HIP, TOTAL, ANTERIOR APPROACH
Anesthesia: General | Site: Hip | Laterality: Left

## 2022-05-13 MED ORDER — PROPOFOL 10 MG/ML IV BOLUS
INTRAVENOUS | Status: DC | PRN
  Administered 2022-05-13: 150 mg via INTRAVENOUS

## 2022-05-13 MED ORDER — ASPIRIN 81 MG PO CHEW
81.0000 mg | CHEWABLE_TABLET | Freq: Two times a day (BID) | ORAL | Status: DC
  Administered 2022-05-13 – 2022-05-14 (×2): 81 mg via ORAL
  Filled 2022-05-13 (×2): qty 1

## 2022-05-13 MED ORDER — FENTANYL CITRATE (PF) 100 MCG/2ML IJ SOLN
INTRAMUSCULAR | Status: AC
  Filled 2022-05-13: qty 2

## 2022-05-13 MED ORDER — ACETAMINOPHEN 500 MG PO TABS
1000.0000 mg | ORAL_TABLET | Freq: Once | ORAL | Status: AC
  Administered 2022-05-13: 1000 mg via ORAL
  Filled 2022-05-13: qty 2

## 2022-05-13 MED ORDER — METOCLOPRAMIDE HCL 5 MG PO TABS
5.0000 mg | ORAL_TABLET | Freq: Three times a day (TID) | ORAL | Status: DC | PRN
  Filled 2022-05-13: qty 2

## 2022-05-13 MED ORDER — DEXMEDETOMIDINE HCL IN NACL 80 MCG/20ML IV SOLN
INTRAVENOUS | Status: DC | PRN
  Administered 2022-05-13: 12 ug via BUCCAL

## 2022-05-13 MED ORDER — DOCUSATE SODIUM 100 MG PO CAPS
100.0000 mg | ORAL_CAPSULE | Freq: Two times a day (BID) | ORAL | Status: DC
  Administered 2022-05-13 – 2022-05-14 (×2): 100 mg via ORAL
  Filled 2022-05-13 (×2): qty 1

## 2022-05-13 MED ORDER — STERILE WATER FOR IRRIGATION IR SOLN
Status: DC | PRN
  Administered 2022-05-13: 2000 mL

## 2022-05-13 MED ORDER — ONDANSETRON HCL 4 MG PO TABS
4.0000 mg | ORAL_TABLET | Freq: Four times a day (QID) | ORAL | Status: DC | PRN
  Administered 2022-05-14: 4 mg via ORAL
  Filled 2022-05-13 (×2): qty 1

## 2022-05-13 MED ORDER — OXYCODONE HCL 5 MG PO TABS
10.0000 mg | ORAL_TABLET | ORAL | Status: DC | PRN
  Administered 2022-05-13 – 2022-05-14 (×3): 10 mg via ORAL
  Filled 2022-05-13: qty 3

## 2022-05-13 MED ORDER — FENTANYL CITRATE (PF) 100 MCG/2ML IJ SOLN
INTRAMUSCULAR | Status: DC | PRN
  Administered 2022-05-13 (×4): 50 ug via INTRAVENOUS

## 2022-05-13 MED ORDER — PREGABALIN 75 MG PO CAPS
75.0000 mg | ORAL_CAPSULE | Freq: Every day | ORAL | Status: DC
  Administered 2022-05-13: 75 mg via ORAL
  Filled 2022-05-13: qty 1

## 2022-05-13 MED ORDER — METHOCARBAMOL 500 MG IVPB - SIMPLE MED
500.0000 mg | Freq: Four times a day (QID) | INTRAVENOUS | Status: DC | PRN
  Administered 2022-05-13: 500 mg via INTRAVENOUS

## 2022-05-13 MED ORDER — METOCLOPRAMIDE HCL 5 MG/ML IJ SOLN: 5.0000 mg | Freq: Three times a day (TID) | INTRAMUSCULAR | Status: DC | PRN

## 2022-05-13 MED ORDER — FLUTICASONE PROPIONATE 50 MCG/ACT NA SUSP: 1.0000 | Freq: Every day | NASAL | Status: DC | PRN

## 2022-05-13 MED ORDER — TRANEXAMIC ACID-NACL 1000-0.7 MG/100ML-% IV SOLN
1000.0000 mg | Freq: Once | INTRAVENOUS | Status: AC
  Administered 2022-05-13: 1000 mg via INTRAVENOUS
  Filled 2022-05-13: qty 100

## 2022-05-13 MED ORDER — FERROUS SULFATE 325 (65 FE) MG PO TABS
325.0000 mg | ORAL_TABLET | Freq: Three times a day (TID) | ORAL | Status: DC
  Administered 2022-05-14 (×2): 325 mg via ORAL
  Filled 2022-05-13 (×2): qty 1

## 2022-05-13 MED ORDER — LINACLOTIDE 145 MCG PO CAPS
145.0000 ug | ORAL_CAPSULE | Freq: Every day | ORAL | Status: DC
  Administered 2022-05-14: 145 ug via ORAL
  Filled 2022-05-13: qty 1

## 2022-05-13 MED ORDER — SUGAMMADEX SODIUM 200 MG/2ML IV SOLN
INTRAVENOUS | Status: DC | PRN
  Administered 2022-05-13: 200 mg via INTRAVENOUS

## 2022-05-13 MED ORDER — ACETAMINOPHEN 500 MG PO TABS
1000.0000 mg | ORAL_TABLET | Freq: Four times a day (QID) | ORAL | Status: DC
  Administered 2022-05-13 – 2022-05-14 (×4): 1000 mg via ORAL
  Filled 2022-05-13 (×4): qty 2

## 2022-05-13 MED ORDER — CEFAZOLIN SODIUM-DEXTROSE 2-4 GM/100ML-% IV SOLN
2.0000 g | Freq: Four times a day (QID) | INTRAVENOUS | Status: AC
  Administered 2022-05-13 – 2022-05-14 (×2): 2 g via INTRAVENOUS
  Filled 2022-05-13 (×2): qty 100

## 2022-05-13 MED ORDER — KETAMINE HCL 10 MG/ML IJ SOLN
INTRAMUSCULAR | Status: DC | PRN
  Administered 2022-05-13: 30 mg via INTRAVENOUS

## 2022-05-13 MED ORDER — OXYCODONE HCL 5 MG PO TABS
5.0000 mg | ORAL_TABLET | ORAL | Status: DC | PRN
  Administered 2022-05-13: 10 mg via ORAL
  Administered 2022-05-13: 5 mg via ORAL
  Filled 2022-05-13 (×3): qty 2

## 2022-05-13 MED ORDER — DEXAMETHASONE SODIUM PHOSPHATE 10 MG/ML IJ SOLN
10.0000 mg | Freq: Once | INTRAMUSCULAR | Status: AC
  Administered 2022-05-14: 10 mg via INTRAVENOUS
  Filled 2022-05-13: qty 1

## 2022-05-13 MED ORDER — LAMOTRIGINE 100 MG PO TABS
100.0000 mg | ORAL_TABLET | Freq: Every day | ORAL | Status: DC
  Administered 2022-05-13 – 2022-05-14 (×2): 100 mg via ORAL
  Filled 2022-05-13 (×2): qty 1

## 2022-05-13 MED ORDER — ONDANSETRON HCL 4 MG/2ML IJ SOLN
INTRAMUSCULAR | Status: DC | PRN
  Administered 2022-05-13: 4 mg via INTRAVENOUS

## 2022-05-13 MED ORDER — CELECOXIB 200 MG PO CAPS
200.0000 mg | ORAL_CAPSULE | Freq: Two times a day (BID) | ORAL | Status: DC
  Administered 2022-05-13 – 2022-05-14 (×2): 200 mg via ORAL
  Filled 2022-05-13 (×2): qty 1

## 2022-05-13 MED ORDER — ORAL CARE MOUTH RINSE: 15.0000 mL | Freq: Once | OROMUCOSAL | Status: AC

## 2022-05-13 MED ORDER — ROCURONIUM BROMIDE 10 MG/ML (PF) SYRINGE
PREFILLED_SYRINGE | INTRAVENOUS | Status: DC | PRN
  Administered 2022-05-13: 10 mg via INTRAVENOUS
  Administered 2022-05-13: 50 mg via INTRAVENOUS

## 2022-05-13 MED ORDER — HYDROMORPHONE HCL 1 MG/ML IJ SOLN
0.5000 mg | INTRAMUSCULAR | Status: DC | PRN
  Filled 2022-05-13: qty 1

## 2022-05-13 MED ORDER — DEXMEDETOMIDINE HCL IN NACL 80 MCG/20ML IV SOLN
INTRAVENOUS | Status: AC
  Filled 2022-05-13: qty 20

## 2022-05-13 MED ORDER — ATORVASTATIN CALCIUM 40 MG PO TABS
80.0000 mg | ORAL_TABLET | Freq: Every day | ORAL | Status: DC
  Administered 2022-05-14: 80 mg via ORAL
  Filled 2022-05-13: qty 2

## 2022-05-13 MED ORDER — TRANEXAMIC ACID-NACL 1000-0.7 MG/100ML-% IV SOLN
1000.0000 mg | INTRAVENOUS | Status: AC
  Administered 2022-05-13: 1000 mg via INTRAVENOUS
  Filled 2022-05-13: qty 100

## 2022-05-13 MED ORDER — HYDROMORPHONE HCL 1 MG/ML IJ SOLN
INTRAMUSCULAR | Status: AC
  Administered 2022-05-13: 1 mg via INTRAVENOUS
  Filled 2022-05-13: qty 1

## 2022-05-13 MED ORDER — LIDOCAINE 2% (20 MG/ML) 5 ML SYRINGE
INTRAMUSCULAR | Status: DC | PRN
  Administered 2022-05-13: 60 mg via INTRAVENOUS

## 2022-05-13 MED ORDER — CHLORHEXIDINE GLUCONATE 0.12 % MT SOLN
15.0000 mL | Freq: Once | OROMUCOSAL | Status: AC
  Administered 2022-05-13: 15 mL via OROMUCOSAL

## 2022-05-13 MED ORDER — LACTATED RINGERS IV SOLN: INTRAVENOUS | Status: DC

## 2022-05-13 MED ORDER — DEXAMETHASONE SODIUM PHOSPHATE 10 MG/ML IJ SOLN: 8.0000 mg | Freq: Once | INTRAMUSCULAR | Status: DC

## 2022-05-13 MED ORDER — MENTHOL 3 MG MT LOZG: 1.0000 | LOZENGE | OROMUCOSAL | Status: DC | PRN

## 2022-05-13 MED ORDER — POLYETHYLENE GLYCOL 3350 17 G PO PACK: 17.0000 g | PACK | Freq: Every day | ORAL | Status: DC | PRN

## 2022-05-13 MED ORDER — PHENYLEPHRINE 80 MCG/ML (10ML) SYRINGE FOR IV PUSH (FOR BLOOD PRESSURE SUPPORT)
PREFILLED_SYRINGE | INTRAVENOUS | Status: AC
  Filled 2022-05-13: qty 30

## 2022-05-13 MED ORDER — ALBUMIN HUMAN 5 % IV SOLN
INTRAVENOUS | Status: AC
  Filled 2022-05-13: qty 250

## 2022-05-13 MED ORDER — INSULIN ASPART 100 UNIT/ML IJ SOLN
0.0000 [IU] | Freq: Three times a day (TID) | INTRAMUSCULAR | Status: DC
  Administered 2022-05-14 (×2): 2 [IU] via SUBCUTANEOUS

## 2022-05-13 MED ORDER — BISACODYL 10 MG RE SUPP: 10.0000 mg | Freq: Every day | RECTAL | Status: DC | PRN

## 2022-05-13 MED ORDER — OXYCODONE HCL 5 MG PO TABS
ORAL_TABLET | ORAL | Status: AC
  Filled 2022-05-13: qty 1

## 2022-05-13 MED ORDER — MIDAZOLAM HCL 2 MG/2ML IJ SOLN
INTRAMUSCULAR | Status: AC
  Filled 2022-05-13: qty 2

## 2022-05-13 MED ORDER — METFORMIN HCL ER 500 MG PO TB24
500.0000 mg | ORAL_TABLET | Freq: Every day | ORAL | Status: DC
  Administered 2022-05-14: 500 mg via ORAL
  Filled 2022-05-13: qty 1

## 2022-05-13 MED ORDER — METHOCARBAMOL 500 MG PO TABS
500.0000 mg | ORAL_TABLET | Freq: Four times a day (QID) | ORAL | Status: DC | PRN
  Administered 2022-05-14: 500 mg via ORAL
  Filled 2022-05-13: qty 1

## 2022-05-13 MED ORDER — SODIUM CHLORIDE 0.9 % IR SOLN
Status: DC | PRN
  Administered 2022-05-13: 1000 mL

## 2022-05-13 MED ORDER — CEFAZOLIN SODIUM-DEXTROSE 2-4 GM/100ML-% IV SOLN
2.0000 g | INTRAVENOUS | Status: AC
  Administered 2022-05-13: 2 g via INTRAVENOUS
  Filled 2022-05-13: qty 100

## 2022-05-13 MED ORDER — KETAMINE HCL 50 MG/5ML IJ SOSY
PREFILLED_SYRINGE | INTRAMUSCULAR | Status: AC
  Filled 2022-05-13: qty 5

## 2022-05-13 MED ORDER — ONDANSETRON HCL 4 MG/2ML IJ SOLN
4.0000 mg | Freq: Four times a day (QID) | INTRAMUSCULAR | Status: DC | PRN
  Administered 2022-05-14: 4 mg via INTRAVENOUS
  Filled 2022-05-13 (×2): qty 2

## 2022-05-13 MED ORDER — EPHEDRINE 5 MG/ML INJ
INTRAVENOUS | Status: AC
  Filled 2022-05-13: qty 5

## 2022-05-13 MED ORDER — POVIDONE-IODINE 10 % EX SWAB
2.0000 | Freq: Once | CUTANEOUS | Status: AC
  Administered 2022-05-13: 2 via TOPICAL

## 2022-05-13 MED ORDER — ALBUMIN HUMAN 5 % IV SOLN: INTRAVENOUS | Status: DC | PRN

## 2022-05-13 MED ORDER — FENTANYL CITRATE PF 50 MCG/ML IJ SOSY
PREFILLED_SYRINGE | INTRAMUSCULAR | Status: AC
  Administered 2022-05-13: 50 ug via INTRAVENOUS
  Filled 2022-05-13: qty 3

## 2022-05-13 MED ORDER — AMISULPRIDE (ANTIEMETIC) 5 MG/2ML IV SOLN: 10.0000 mg | Freq: Once | INTRAVENOUS | Status: DC | PRN

## 2022-05-13 MED ORDER — METHOCARBAMOL 500 MG IVPB - SIMPLE MED
INTRAVENOUS | Status: AC
  Filled 2022-05-13: qty 55

## 2022-05-13 MED ORDER — FENTANYL CITRATE PF 50 MCG/ML IJ SOSY
25.0000 ug | PREFILLED_SYRINGE | INTRAMUSCULAR | Status: DC | PRN
  Administered 2022-05-13 (×2): 50 ug via INTRAVENOUS

## 2022-05-13 MED ORDER — 0.9 % SODIUM CHLORIDE (POUR BTL) OPTIME
TOPICAL | Status: DC | PRN
  Administered 2022-05-13: 1000 mL

## 2022-05-13 MED ORDER — DIPHENHYDRAMINE HCL 12.5 MG/5ML PO ELIX: 12.5000 mg | ORAL_SOLUTION | ORAL | Status: DC | PRN

## 2022-05-13 MED ORDER — PRAZOSIN HCL 1 MG PO CAPS
2.0000 mg | ORAL_CAPSULE | Freq: Every day | ORAL | Status: DC
  Administered 2022-05-14: 2 mg via ORAL
  Filled 2022-05-13: qty 2

## 2022-05-13 MED ORDER — PHENOL 1.4 % MT LIQD: 1.0000 | OROMUCOSAL | Status: DC | PRN

## 2022-05-13 MED ORDER — SODIUM CHLORIDE 0.9 % IV SOLN: INTRAVENOUS | Status: DC

## 2022-05-13 MED ORDER — MIDAZOLAM HCL 5 MG/5ML IJ SOLN
INTRAMUSCULAR | Status: DC | PRN
  Administered 2022-05-13: 2 mg via INTRAVENOUS

## 2022-05-13 MED ORDER — DEXAMETHASONE SODIUM PHOSPHATE 10 MG/ML IJ SOLN
INTRAMUSCULAR | Status: DC | PRN
  Administered 2022-05-13: 10 mg via INTRAVENOUS

## 2022-05-13 SURGICAL SUPPLY — 37 items
BAG COUNTER SPONGE SURGICOUNT (BAG) IMPLANT
BAG DECANTER FOR FLEXI CONT (MISCELLANEOUS) IMPLANT
BAG ZIPLOCK 12X15 (MISCELLANEOUS) IMPLANT
BLADE SAG 18X100X1.27 (BLADE) ×1 IMPLANT
COVER PERINEAL POST (MISCELLANEOUS) ×1 IMPLANT
COVER SURGICAL LIGHT HANDLE (MISCELLANEOUS) ×1 IMPLANT
CUP ACETBLR 52 OD PINNACLE (Hips) IMPLANT
DERMABOND ADVANCED .7 DNX12 (GAUZE/BANDAGES/DRESSINGS) ×1 IMPLANT
DRAPE FOOT SWITCH (DRAPES) ×1 IMPLANT
DRAPE STERI IOBAN 125X83 (DRAPES) ×1 IMPLANT
DRAPE U-SHAPE 47X51 STRL (DRAPES) ×2 IMPLANT
DRESSING AQUACEL AG SP 3.5X10 (GAUZE/BANDAGES/DRESSINGS) ×1 IMPLANT
DRSG AQUACEL AG SP 3.5X10 (GAUZE/BANDAGES/DRESSINGS) ×1
DURAPREP 26ML APPLICATOR (WOUND CARE) ×1 IMPLANT
ELECT REM PT RETURN 15FT ADLT (MISCELLANEOUS) ×1 IMPLANT
GLOVE BIO SURGEON STRL SZ 6 (GLOVE) ×1 IMPLANT
GLOVE BIOGEL PI IND STRL 6.5 (GLOVE) ×1 IMPLANT
GLOVE BIOGEL PI IND STRL 7.5 (GLOVE) ×1 IMPLANT
GLOVE ORTHO TXT STRL SZ7.5 (GLOVE) ×2 IMPLANT
GOWN STRL REUS W/ TWL LRG LVL3 (GOWN DISPOSABLE) ×3 IMPLANT
GOWN STRL REUS W/TWL LRG LVL3 (GOWN DISPOSABLE) ×3
HEAD CERAMIC 36 PLUS5 (Hips) IMPLANT
HOLDER FOLEY CATH W/STRAP (MISCELLANEOUS) ×1 IMPLANT
KIT TURNOVER KIT A (KITS) IMPLANT
LINER NEUTRAL 52X36MM PLUS 4 (Liner) IMPLANT
PACK ANTERIOR HIP CUSTOM (KITS) ×1 IMPLANT
SCREW 6.5MMX30MM (Screw) IMPLANT
STEM FEM ACTIS HIGH SZ3 (Stem) IMPLANT
SUT MNCRL AB 4-0 PS2 18 (SUTURE) ×1 IMPLANT
SUT STRATAFIX 0 PDS 27 VIOLET (SUTURE) ×1
SUT VIC AB 1 CT1 36 (SUTURE) ×3 IMPLANT
SUT VIC AB 2-0 CT1 27 (SUTURE) ×2
SUT VIC AB 2-0 CT1 TAPERPNT 27 (SUTURE) ×2 IMPLANT
SUTURE STRATFX 0 PDS 27 VIOLET (SUTURE) ×1 IMPLANT
TRAY FOLEY MTR SLVR 16FR STAT (SET/KITS/TRAYS/PACK) IMPLANT
TUBE SUCTION HIGH CAP CLEAR NV (SUCTIONS) ×1 IMPLANT
WATER STERILE IRR 1000ML POUR (IV SOLUTION) ×1 IMPLANT

## 2022-05-13 NOTE — Transfer of Care (Signed)
Immediate Anesthesia Transfer of Care Note  Patient: Meghan Wilson  Procedure(s) Performed: Procedure(s): TOTAL HIP ARTHROPLASTY ANTERIOR APPROACH (Left)  Patient Location: PACU  Anesthesia Type:General  Level of Consciousness: Alert, Awake, Oriented  Airway & Oxygen Therapy: Patient Spontanous Breathing  Post-op Assessment: Report given to RN  Post vital signs: Reviewed and stable  Last Vitals:  Vitals:   05/13/22 1211  BP: 127/73  Pulse: 88  Resp: 16  Temp: 37.3 C  SpO2: 915%    Complications: No apparent anesthesia complications

## 2022-05-13 NOTE — Op Note (Signed)
NAME:  Yakima Kreitzer NO.: [id number]      MEDICAL RECORD NO.: [id number]      FACILITY:  [facility]      PHYSICIAN:  Mauri Pole  DATE OF BIRTH:  [birth year]     DATE OF PROCEDURE:  05/13/2022                                 OPERATIVE REPORT         PREOPERATIVE DIAGNOSIS: Left  hip osteoarthritis.      POSTOPERATIVE DIAGNOSIS:  Left hip osteoarthritis.      PROCEDURE:  Left total hip replacement through an anterior approach   utilizing DePuy THR system, component size 52 mm pinnacle cup, a size 36+4 neutral   Altrex liner, a size 3Hi Actis stem with a 36+5 delta ceramic   ball.      SURGEON:  Pietro Cassis. Alvan Dame, M.D.      ASSISTANT:  Costella Hatcher, PA-C     ANESTHESIA:  Spinal.      SPECIMENS:  None.      COMPLICATIONS:  None.      BLOOD LOSS:  900 cc     DRAINS:  None.      INDICATION OF THE PROCEDURE:  Meghan Wilson is a 61 y.o. female who had   presented to office for evaluation of left hip pain.  Radiographs revealed   progressive degenerative changes with bone-on-bone   articulation of the  hip joint, including subchondral cystic changes and osteophytes.  The patient had painful limited range of   motion significantly affecting their overall quality of life and function.  The patient was failing to    respond to conservative measures including medications and/or injections and activity modification and at this point was ready   to proceed with more definitive measures.  Consent was obtained for   benefit of pain relief.  Specific risks of infection, DVT, component   failure, dislocation, neurovascular injury, and need for revision surgery were reviewed in the office.     PROCEDURE IN DETAIL:  The patient was brought to operative theater.   Once adequate anesthesia, preoperative antibiotics, 2 gm of Ancef, 1 gm of Tranexamic Acid, and 10 mg of Decadron were administered, the patient was positioned supine on the [employer]  table.  Once the patient was safely positioned with adequate padding of boney prominences we predraped out the hip, and used fluoroscopy to confirm orientation of the pelvis.      The left hip was then prepped and draped from proximal iliac crest to   mid thigh with a shower curtain technique.      Time-out was performed identifying the patient, planned procedure, and the appropriate extremity.     An incision was then made 2 cm lateral to the   anterior superior iliac spine extending over the orientation of the   tensor fascia lata muscle and sharp dissection was carried down to the   fascia of the muscle.      The fascia was then incised.  The muscle belly was identified and swept   laterally and retractor placed along the superior neck.  Following   cauterization of the circumflex vessels and removing some pericapsular   fat, a second cobra retractor was placed on the inferior neck.  A  T-capsulotomy was made along the line of the   superior neck to the trochanteric fossa, then extended proximally and   distally.  Tag sutures were placed and the retractors were then placed   intracapsular.  We then identified the trochanteric fossa and   orientation of my neck cut and then made a neck osteotomy with the femur on traction.  The femoral   head was removed without difficulty or complication.  Traction was let   off and retractors were placed posterior and anterior around the   acetabulum.      The labrum and foveal tissue were debrided.  I began reaming with a 45 mm   reamer and reamed up to 51 mm reamer with good bony bed preparation and a 52 mm  cup was chosen.  The final 52 mm Pinnacle cup was then impacted under fluoroscopy to confirm the depth of penetration and orientation with respect to   Abduction and forward flexion.  A screw was placed into the ilium followed by the hole eliminator.  The final   36+4 neutral Altrex liner was impacted with good visualized rim fit.  The cup was  positioned anatomically within the acetabular portion of the pelvis.      At this point, the femur was rolled to 100 degrees.  Further capsule was   released off the inferior aspect of the femoral neck.  I then   released the superior capsule proximally.  With the leg in a neutral position the hook was placed laterally   along the femur under the vastus lateralis origin and elevated manually and then held in position using the hook attachment on the bed.  The leg was then extended and adducted with the leg rolled to 100   degrees of external rotation.  Retractors were placed along the medial calcar and posteriorly over the greater trochanter.  Once the proximal femur was fully   exposed, I used a box osteotome to set orientation.  I then began   broaching with the starting chili pepper broach and passed this by hand and then broached up to 3.  With the 3 broach in place I chose a high offset neck and did several trial reductions.  The offset was appropriate, leg lengths   appeared to be equal best matched with the +5 head ball trial confirmed radiographically.   Given these findings, I went ahead and dislocated the hip, repositioned all   retractors and positioned the right hip in the extended and abducted position.  The final 3 Hi Actis stem was   chosen and it was impacted down to the level of neck cut.  Based on this   and the trial reductions, a final 36+5 delta ceramic ball was chosen and   impacted onto a clean and dry trunnion, and the hip was reduced.  The   hip had been irrigated throughout the case again at this point.  I did   reapproximate the superior capsular leaflet to the anterior leaflet   using #1 Vicryl.  The fascia of the   tensor fascia lata muscle was then reapproximated using #1 Vicryl and #0 Stratafix sutures.  The   remaining wound was closed with 2-0 Vicryl and running 4-0 Monocryl.   The hip was cleaned, dried, and dressed sterilely using Dermabond and   Aquacel  dressing.  The patient was then brought   to recovery room in stable condition tolerating the procedure well.    Costella Hatcher, PA-C was  present for the entirety of the case involved from   preoperative positioning, perioperative retractor management, general   facilitation of the case, as well as primary wound closure as assistant.            Pietro Cassis Alvan Dame, M.D.        05/13/2022 2:35 PM

## 2022-05-13 NOTE — Anesthesia Postprocedure Evaluation (Signed)
Anesthesia Post Note  Patient: Meghan Wilson  Procedure(s) Performed: TOTAL HIP ARTHROPLASTY ANTERIOR APPROACH (Left: Hip)     Patient location during evaluation: PACU Anesthesia Type: General Level of consciousness: awake and alert Pain management: pain level controlled Vital Signs Assessment: post-procedure vital signs reviewed and stable Respiratory status: spontaneous breathing, nonlabored ventilation, respiratory function stable and patient connected to nasal cannula oxygen Cardiovascular status: blood pressure returned to baseline and stable Postop Assessment: no apparent nausea or vomiting Anesthetic complications: no   No notable events documented.  Last Vitals:  Vitals:   05/13/22 1715 05/13/22 1824  BP: 123/72 137/84  Pulse: 90 88  Resp: (!) 21 18  Temp:  36.7 C  SpO2: 97% 100%    Last Pain:  Vitals:   05/13/22 1824  TempSrc: Oral  PainSc: 8                  Tiajuana Amass

## 2022-05-13 NOTE — Anesthesia Procedure Notes (Signed)
Procedure Name: Intubation Date/Time: 05/13/2022 2:34 PM  Performed by: Gerald Leitz, CRNAPre-anesthesia Checklist: Patient identified, Patient being monitored, Timeout performed, Emergency Drugs available and Suction available Patient Re-evaluated:Patient Re-evaluated prior to induction Oxygen Delivery Method: Circle system utilized Preoxygenation: Pre-oxygenation with 100% oxygen Induction Type: IV induction Ventilation: Mask ventilation without difficulty Laryngoscope Size: Mac and 3 Grade View: Grade I Tube type: Oral Tube size: 7.0 mm Number of attempts: 1 Airway Equipment and Method: Stylet Placement Confirmation: ETT inserted through vocal cords under direct vision, positive ETCO2 and breath sounds checked- equal and bilateral Secured at: 21 cm Tube secured with: Tape Dental Injury: Teeth and Oropharynx as per pre-operative assessment

## 2022-05-13 NOTE — Anesthesia Preprocedure Evaluation (Signed)
Anesthesia Evaluation  Patient identified by MRN, date of birth, ID band Patient awake    Reviewed: Allergy & Precautions, NPO status , Patient's Chart, lab work & pertinent test results  History of Anesthesia Complications Negative for: history of anesthetic complications  Airway Mallampati: II  TM Distance: >3 FB Neck ROM: Full    Dental no notable dental hx. (+) Dental Advisory Given, Teeth Intact   Pulmonary neg pulmonary ROS, sleep apnea , former smoker,    Pulmonary exam normal breath sounds clear to auscultation       Cardiovascular negative cardio ROS Normal cardiovascular exam Rhythm:Regular Rate:Normal     Neuro/Psych Anxiety Depression Bipolar Disorder negative neurological ROS  negative psych ROS   GI/Hepatic negative GI ROS, Neg liver ROS, GERD  ,  Endo/Other  negative endocrine ROSdiabetes, Type 2, Oral Hypoglycemic Agents  Renal/GU negative Renal ROS  negative genitourinary   Musculoskeletal negative musculoskeletal ROS (+) Arthritis ,   Abdominal   Peds negative pediatric ROS (+)  Hematology negative hematology ROS (+)   Anesthesia Other Findings   Reproductive/Obstetrics negative OB ROS                             Anesthesia Physical  Anesthesia Plan  ASA: 2  Anesthesia Plan: General   Post-op Pain Management: Regional block*, Tylenol PO (pre-op)* and Toradol IV (intra-op)*   Induction: Intravenous  PONV Risk Score and Plan: 3 and Ondansetron, Dexamethasone, Midazolam and Treatment may vary due to age or medical condition  Airway Management Planned: Oral ETT  Additional Equipment: None  Intra-op Plan:   Post-operative Plan: Extubation in OR  Informed Consent: I have reviewed the patients History and Physical, chart, labs and discussed the procedure including the risks, benefits and alternatives for the proposed anesthesia with the patient or authorized  representative who has indicated his/her understanding and acceptance.     Dental advisory given  Plan Discussed with: Anesthesiologist and CRNA  Anesthesia Plan Comments: ( Pt refused SAB. Plan GETA. Routine monitors.)        Anesthesia Quick Evaluation

## 2022-05-13 NOTE — Interval H&P Note (Signed)
History and Physical Interval Note:  05/13/2022 1:24 PM  Meghan Wilson  has presented today for surgery, with the diagnosis of Left hip osteoarthritis.  The various methods of treatment have been discussed with the patient and family. After consideration of risks, benefits and other options for treatment, the patient has consented to  Procedure(s): TOTAL HIP ARTHROPLASTY ANTERIOR APPROACH (Left) as a surgical intervention.  The patient's history has been reviewed, patient examined, no change in status, stable for surgery.  I have reviewed the patient's chart and labs.  Questions were answered to the patient's satisfaction.     Mauri Pole

## 2022-05-13 NOTE — Discharge Instructions (Signed)

## 2022-05-14 ENCOUNTER — Other Ambulatory Visit (HOSPITAL_COMMUNITY): Payer: Self-pay

## 2022-05-14 ENCOUNTER — Other Ambulatory Visit: Payer: Self-pay

## 2022-05-14 ENCOUNTER — Encounter (HOSPITAL_COMMUNITY): Payer: Self-pay | Admitting: Orthopedic Surgery

## 2022-05-14 DIAGNOSIS — M1612 Unilateral primary osteoarthritis, left hip: Secondary | ICD-10-CM | POA: Diagnosis not present

## 2022-05-14 LAB — CBC
HCT: 27.5 % — ABNORMAL LOW (ref 36.0–46.0)
Hemoglobin: 8.5 g/dL — ABNORMAL LOW (ref 12.0–15.0)
MCH: 26.5 pg (ref 26.0–34.0)
MCHC: 30.9 g/dL (ref 30.0–36.0)
MCV: 85.7 fL (ref 80.0–100.0)
Platelets: 245 10*3/uL (ref 150–400)
RBC: 3.21 MIL/uL — ABNORMAL LOW (ref 3.87–5.11)
RDW: 16.4 % — ABNORMAL HIGH (ref 11.5–15.5)
WBC: 9.4 10*3/uL (ref 4.0–10.5)
nRBC: 0 % (ref 0.0–0.2)

## 2022-05-14 LAB — BASIC METABOLIC PANEL
Anion gap: 6 (ref 5–15)
BUN: 8 mg/dL (ref 8–23)
CO2: 25 mmol/L (ref 22–32)
Calcium: 8.5 mg/dL — ABNORMAL LOW (ref 8.9–10.3)
Chloride: 109 mmol/L (ref 98–111)
Creatinine, Ser: 0.49 mg/dL (ref 0.44–1.00)
GFR, Estimated: >60 mL/min (ref >60)
Glucose, Bld: 134 mg/dL — ABNORMAL HIGH (ref 70–99)
Potassium: 3.9 mmol/L (ref 3.5–5.1)
Sodium: 140 mmol/L (ref 135–145)

## 2022-05-14 LAB — GLUCOSE, CAPILLARY
Glucose-Capillary: 134 mg/dL — ABNORMAL HIGH (ref 70–99)
Glucose-Capillary: 123 mg/dL — ABNORMAL HIGH (ref 70–99)

## 2022-05-14 MED ORDER — SODIUM CHLORIDE 0.9 % IV BOLUS: 250.0000 mL | Freq: Once | INTRAVENOUS | Status: DC

## 2022-05-14 MED ORDER — OXYCODONE HCL 5 MG PO TABS
5.0000 mg | ORAL_TABLET | ORAL | 0 refills | Status: DC | PRN
  Filled 2022-05-14: qty 42, 7d supply, fill #0

## 2022-05-14 MED ORDER — METHOCARBAMOL 500 MG PO TABS
500.0000 mg | ORAL_TABLET | Freq: Four times a day (QID) | ORAL | 2 refills | Status: DC | PRN
  Filled 2022-05-14 (×2): qty 40, 10d supply, fill #0

## 2022-05-14 MED ORDER — ONDANSETRON HCL 4 MG PO TABS
4.0000 mg | ORAL_TABLET | Freq: Four times a day (QID) | ORAL | 0 refills | Status: AC | PRN
  Filled 2022-05-14: qty 20, 5d supply, fill #0

## 2022-05-14 MED ORDER — ASPIRIN 81 MG PO CHEW
81.0000 mg | CHEWABLE_TABLET | Freq: Two times a day (BID) | ORAL | 0 refills | Status: AC
  Filled 2022-05-14: qty 56, 28d supply, fill #0

## 2022-05-14 MED ORDER — ACETAMINOPHEN 500 MG PO TABS: 1000.0000 mg | ORAL_TABLET | Freq: Four times a day (QID) | ORAL | 0 refills | Status: DC

## 2022-05-14 MED ORDER — DOCUSATE SODIUM 100 MG PO CAPS
100.0000 mg | ORAL_CAPSULE | Freq: Two times a day (BID) | ORAL | 0 refills | Status: DC
  Filled 2022-05-14: qty 10, 5d supply, fill #0

## 2022-05-14 MED ORDER — CEFADROXIL 500 MG PO CAPS
500.0000 mg | ORAL_CAPSULE | Freq: Two times a day (BID) | ORAL | 0 refills | Status: AC
  Filled 2022-05-14: qty 14, 7d supply, fill #0

## 2022-05-14 NOTE — Plan of Care (Signed)
Plan of care reviewed and discussed with the patient and her husband. 

## 2022-05-14 NOTE — Progress Notes (Signed)
Provided discharge education/instructions, all questions and concerns addressed. Pt not in acute distress, discharged home with belongings accompanied by husband. 

## 2022-05-14 NOTE — Progress Notes (Addendum)
   Subjective: 1 Day Post-Op Procedure(s) (LRB): TOTAL HIP ARTHROPLASTY ANTERIOR APPROACH (Left) Patient reports pain as mild.   Patient seen in rounds with Dr. Alvan Dame. Patient is resting in bed with her husband at the bedside. No acute events overnight. Foley catheter removed.  We will continue therapy today.   Objective: Vital signs in last 24 hours: Temp:  [97.7 F (36.5 C)-99.1 F (37.3 C)] 98.3 F (36.8 C) (09/13 0556) Pulse Rate:  [87-96] 93 (09/13 0556) Resp:  [14-21] 18 (09/13 0556) BP: (103-146)/(58-84) 103/58 (09/13 0556) SpO2:  [96 %-100 %] 96 % (09/13 0556) Weight:  [87.6 kg] 87.6 kg (09/12 1219)  Intake/Output from previous day:  Intake/Output Summary (Last 24 hours) at 05/14/2022 0745 Last data filed at 05/14/2022 0530 Gross per 24 hour  Intake 2911.08 ml  Output 850 ml  Net 2061.08 ml     Intake/Output this shift: No intake/output data recorded.  Labs: Recent Labs    05/14/22 0330  HGB 8.5*   Recent Labs    05/14/22 0330  WBC 9.4  RBC 3.21*  HCT 27.5*  PLT 245   Recent Labs    05/14/22 0330  NA 140  K 3.9  CL 109  CO2 25  BUN 8  CREATININE 0.49  GLUCOSE 134*  CALCIUM 8.5*   No results for input(s): "LABPT", "INR" in the last 72 hours.  Exam: General - Patient is Alert and Oriented Extremity - Neurologically intact Sensation intact distally Intact pulses distally Dorsiflexion/Plantar flexion intact Dressing - dressing C/D/I Motor Function - intact, moving foot and toes well on exam.   Past Medical History:  Diagnosis Date   Anemia    Anxiety    Arthritis    Back pain    Bipolar depression ([place]) 09/2015   Constipation    Depression 06/27/2021   Dizziness    History of colon polyps    History of kidney stones    History of patellar fracture 2016   History of tibial fracture 2016   Hyperlipidemia    IBS (irritable bowel syndrome)    Joint pain    Night terrors    Night terrors, adult 09/2015   Osteoarthritis    Pain of  right knee after injury 03/10/2018   Post concussion syndrome 12/20/2019   Pre-diabetes    Seasonal allergies 11/09/2020   Sleep apnea    uses cpap   Vitamin B 12 deficiency    Vitamin D deficiency     Assessment/Plan: 1 Day Post-Op Procedure(s) (LRB): TOTAL HIP ARTHROPLASTY ANTERIOR APPROACH (Left) Principal Problem:   S/P total left hip arthroplasty  Estimated body mass index is 34.22 kg/m as calculated from the following:   Height as of this encounter: '5\' 3"'$  (1.6 m).   Weight as of this encounter: 87.6 kg. Advance diet Up with therapy D/C IV fluids  DVT Prophylaxis - Aspirin Weight bearing as tolerated.  Hgb stable at 8.5 this AM.  Will place on 1 week of prophylactic abx due to poor skin quality/wound concerns.  Plan is to go Home after hospital stay. Plan for discharge today after meeting goals with therapy. Follow up in the office in 2 weeks.   Griffith Citron, PA-C Orthopedic Surgery [phone] 05/14/2022, 7:45 AM

## 2022-05-14 NOTE — TOC Initial Note (Signed)
Transition of Care [facility]) - Initial/Assessment Note    Patient Details  Name: Meghan Wilson MRN: [id number] Date of Birth: Jan 10, 1961  [facility] CM/SW Contact:    Joaquin Courts, RN Phone Number: 05/14/2022, 10:39 AM  Clinical Narrative:                 Patient plans to discharge home with HEP, significant other at bedside and aware of plan.  Reports patient has RW at home. No further TOC needs identified.   Expected Discharge Plan: Home/Self Care Barriers to Discharge: No Barriers Identified   Patient Goals and CMS Choice Patient states their goals for this hospitalization and ongoing recovery are:: to go home      Expected Discharge Plan and Services Expected Discharge Plan: Home/Self Care   Discharge Planning Services: CM Consult     Expected Discharge Date: 05/14/22               DME Arranged: N/A DME Agency: NA       HH Arranged: NA          Prior Living Arrangements/Services   Lives with:: Significant Other Patient language and need for interpreter reviewed:: Yes Do you feel safe going back to the place where you live?: Yes      Need for Family Participation in Patient Care: Yes (Comment) Care giver support system in place?: Yes (comment)   Criminal Activity/Legal Involvement Pertinent to Current Situation/Hospitalization: No - Comment as needed  Activities of Daily Living Home Assistive Devices/Equipment: Eyeglasses, CBG Meter, Walker (specify type), Bedside commode/3-in-1, CPAP ADL Screening (condition at time of admission) Patient's cognitive ability adequate to safely complete daily activities?: Yes Is the patient deaf or have difficulty hearing?: No Does the patient have difficulty seeing, even when wearing glasses/contacts?: No Does the patient have difficulty concentrating, remembering, or making decisions?: No Patient able to express need for assistance with ADLs?: Yes Does the patient have difficulty dressing or bathing?:  No Independently performs ADLs?: Yes (appropriate for developmental age) Does the patient have difficulty walking or climbing stairs?: Yes Weakness of Legs: Left Weakness of Arms/Hands: None  Permission Sought/Granted                  Emotional Assessment Appearance:: Appears stated age Attitude/Demeanor/Rapport: Engaged Affect (typically observed): Accepting     Psych Involvement: No (comment)  Admission diagnosis:  S/P total left hip arthroplasty [G83.662] Patient Active Problem List   Diagnosis Date Noted   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   OSA (obstructive sleep apnea) 04/07/2022   Anemia 04/07/2022   Pain of left hip joint 02/17/2022   Shortened PR interval 11/21/2021   S/P total knee arthroplasty, 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   Chronic knee pain after total replacement of right knee joint 10/02/2021   S/P hardware removal, right tibia 09/17/2021   Pain in joint of right shoulder 09/16/2021   Aortic atherosclerosis ([place]) 07/08/2021   Depression 06/27/2021   Obesity (BMI 35.0-39.9 without comorbidity) 06/27/2021   Generalized abdominal discomfort 05/06/2021   Vaginal atrophy 11/09/2020   Type II diabetes mellitus ([place]) 02/16/2019   Healthcare maintenance 01/02/2018   Hyperlipidemia 01/01/2018   Peptic gastritis 01/01/2018   Bipolar disorder ([place]) 08/03/2015   PCP:  Idamae Schuller, MD Pharmacy:   [facility] [address] [place] [address] Phone: [phone] Fax: [place]  [address] [facility] [address] Phone: [phone] Fax: [phone]     Social Determinants of Health (SDOH) Interventions    Readmission Risk Interventions     No data to display

## 2022-05-14 NOTE — Plan of Care (Signed)

## 2022-05-14 NOTE — Evaluation (Signed)
Physical Therapy Evaluation Patient Details Name: Meghan Wilson MRN: [id number] DOB: 29-Sep-1960 Today's Date: 05/14/2022  History of Present Illness  Pt is 61 yo female admitted for L anterior THA on 05/13/22.  Pt has hx including R TKA, OSA, DM2, HLD, bipolar disorder, arthritis.  Clinical Impression  Pt is s/p THA resulting in the deficits listed below (see PT Problem List). At baseline pt is independent.  She has DME, home support, and prior TKA so familiar with rehab process.  Today, pt with good pain control and ambulating 300' with supervision.  Pt demonstrates safe gait & transfers in order to return home from PT perspective once discharged by MD.  While in hospital, will continue to benefit from PT for skilled therapy to advance mobility and exercises.            Recommendations for follow up therapy are one component of a multi-disciplinary discharge planning process, led by the attending physician.  Recommendations may be updated based on patient status, additional functional criteria and insurance authorization.  Follow Up Recommendations Follow physician's recommendations for discharge plan and follow up therapies      Assistance Recommended at Discharge Intermittent Supervision/Assistance  Patient can return home with the following  A little help with walking and/or transfers;A little help with bathing/dressing/bathroom;Assistance with cooking/housework;Help with stairs or ramp for entrance    Equipment Recommendations None recommended by PT  Recommendations for Other Services       Functional Status Assessment Patient has had a recent decline in their functional status and demonstrates the ability to make significant improvements in function in a reasonable and predictable amount of time.     Precautions / Restrictions Precautions Precautions: Fall Restrictions Weight Bearing Restrictions: Yes LLE Weight Bearing: Weight bearing as tolerated      Mobility  Bed  Mobility Overal bed mobility: Needs Assistance Bed Mobility: Supine to Sit     Supine to sit: Supervision     General bed mobility comments: utilizing gait belt to assist L LE without cues    Transfers Overall transfer level: Needs assistance Equipment used: Rolling walker (2 wheels) Transfers: Sit to/from Stand Sit to Stand: Supervision, Min guard           General transfer comment: Min guard progressed to supervision; performed x 5 during session (bed, chair, toilet)    Ambulation/Gait Ambulation/Gait assistance: Min guard, Supervision Gait Distance (Feet): 300 Feet Assistive device: Rolling walker (2 wheels) Gait Pattern/deviations: Step-through pattern, Step-to pattern, Decreased stride length, Decreased weight shift to left Gait velocity: decreased     General Gait Details: Started min guard step to progressed to supervision step through.  Pt iniitially having difficulty advancing L LE but after a few steps able to progress to step through pattern with only slight decrease in weight shift to L  Stairs            Wheelchair Mobility    Modified Rankin (Stroke Patients Only)       Balance Overall balance assessment: Needs assistance Sitting-balance support: No upper extremity supported Sitting balance-Leahy Scale: Normal     Standing balance support: Bilateral upper extremity supported, No upper extremity supported Standing balance-Leahy Scale: Fair Standing balance comment: RW to ambulate but could stand no AD                             Pertinent Vitals/Pain Pain Assessment Pain Assessment: 0-10 Pain Score: 6  Pain Location: L hip  Pain Descriptors / Indicators: Discomfort, Sore Pain Intervention(s): Limited activity within patient's tolerance, Monitored during session, Repositioned, Premedicated before session, Ice applied    Home Living Family/patient expects to be discharged to:: Private residence Living Arrangements:  Spouse/significant other Available Help at Discharge: Family;Available 24 hours/day Type of Home: House Home Access: Ramped entrance       Home Layout: One level Home Equipment: Conservation officer, nature (2 wheels);Cane - single point;Grab bars - toilet;BSC/3in1;Shower seat      Prior Function Prior Level of Function : Independent/Modified Independent;Driving             Mobility Comments: Could ambulate in community ; used a cane because of hip pain ADLs Comments: Independent with ADLs and IADLs     Hand Dominance        Extremity/Trunk Assessment   Upper Extremity Assessment Upper Extremity Assessment: Overall WFL for tasks assessed    Lower Extremity Assessment Lower Extremity Assessment: LLE deficits/detail LLE Deficits / Details: ROM WFL; MMT: ankle 5/5, knee 3/5, hip 2/5    Cervical / Trunk Assessment Cervical / Trunk Assessment: Normal  Communication   Communication: No difficulties  Cognition Arousal/Alertness: Awake/alert Behavior During Therapy: WFL for tasks assessed/performed Overall Cognitive Status: Within Functional Limits for tasks assessed                                          General Comments   Educated on safe ice use, no pivots, car transfers. Also, encouraged walking every 1-2 hours during day. Educated on HEP with focus on mobility the first weeks. Discussed doing exercises within pain control and if pain increasing could decreased ROM, reps, and stop exercises as needed. Encouraged to perform quad sets and ankle pumps frequently for blood flow and to promote full knee extension.    Exercises Total Joint Exercises Ankle Circles/Pumps: AROM, Both, 10 reps, Supine Quad Sets: AROM, Both, 10 reps, Supine Heel Slides: AAROM, Left, 5 reps, Supine Hip ABduction/ADduction: AAROM, Left, 5 reps, Supine Long Arc Quad: AROM, Left, 5 reps, Seated Other Exercises Other Exercises: Educated use of gait belt to assist   Assessment/Plan     PT Assessment Patient needs continued PT services  PT Problem List Decreased strength;Decreased mobility;Decreased range of motion;Decreased activity tolerance;Decreased balance;Decreased knowledge of use of DME;Pain       PT Treatment Interventions DME instruction;Therapeutic activities;Modalities;Gait training;Therapeutic exercise;Patient/family education;Stair training;Functional mobility training    PT Goals (Current goals can be found in the Care Plan section)  Acute Rehab PT Goals Patient Stated Goal: return home soon PT Goal Formulation: With patient/family Time For Goal Achievement: 05/28/22 Potential to Achieve Goals: Good    Frequency 7X/week     Co-evaluation               AM-PAC PT "6 Clicks" Mobility  Outcome Measure Help needed turning from your back to your side while in a flat bed without using bedrails?: None Help needed moving from lying on your back to sitting on the side of a flat bed without using bedrails?: A Little Help needed moving to and from a bed to a chair (including a wheelchair)?: A Little Help needed standing up from a chair using your arms (e.g., wheelchair or bedside chair)?: A Little Help needed to walk in hospital room?: A Little Help needed climbing 3-5 steps with a railing? : A Little 6 Click Score: 19  End of Session Equipment Utilized During Treatment: Gait belt Activity Tolerance: Patient tolerated treatment well Patient left: with chair alarm set;in chair;with call bell/phone within reach;with family/visitor present Nurse Communication: Mobility status PT Visit Diagnosis: Other abnormalities of gait and mobility (R26.89);Muscle weakness (generalized) (M62.81)    Time: 0525-9102 PT Time Calculation (min) (ACUTE ONLY): 32 min   Charges:   PT Evaluation $PT Eval Low Complexity: 1 Low PT Treatments $Gait Training: 8-22 mins        Abran Richard, PT Acute Rehab [facility] Rehab [phone]   Karlton Lemon 05/14/2022, 12:39 PM

## 2022-05-21 ENCOUNTER — Other Ambulatory Visit (HOSPITAL_COMMUNITY): Payer: Self-pay

## 2022-05-21 MED ORDER — OXYCODONE HCL 5 MG PO TABS
5.0000 mg | ORAL_TABLET | Freq: Three times a day (TID) | ORAL | 0 refills | Status: DC | PRN
  Filled 2022-05-21: qty 42, 7d supply, fill #0

## 2022-05-22 ENCOUNTER — Other Ambulatory Visit (HOSPITAL_COMMUNITY): Payer: Self-pay

## 2022-05-22 NOTE — Discharge Summary (Signed)
Patient ID: Meghan Wilson MRN: [id number] DOB/AGE: 62/27/1962 62 y.o.  Admit date: 05/13/2022 Discharge date: 05/14/2022  Admission Diagnoses:  Left hip osteoarthritis  Discharge Diagnoses:  Principal Problem:   S/P total left hip arthroplasty   Past Medical History:  Diagnosis Date   Anemia    Anxiety    Arthritis    Back pain    Bipolar depression ([place]) 09/2015   Constipation    Depression 06/27/2021   Dizziness    History of colon polyps    History of kidney stones    History of patellar fracture 2016   History of tibial fracture 2016   Hyperlipidemia    IBS (irritable bowel syndrome)    Joint pain    Night terrors    Night terrors, adult 09/2015   Osteoarthritis    Pain of right knee after injury 03/10/2018   Post concussion syndrome 12/20/2019   Pre-diabetes    Seasonal allergies 11/09/2020   Sleep apnea    uses cpap   Vitamin B 12 deficiency    Vitamin D deficiency     Surgeries: Procedure(s): TOTAL HIP ARTHROPLASTY ANTERIOR APPROACH on 05/13/2022   Consultants:   Discharged Condition: Improved  Hospital Course: Meghan Wilson is an 61 y.o. female who was admitted 05/13/2022 for operative treatment ofS/P total left hip arthroplasty. Patient has severe unremitting pain that affects sleep, daily activities, and work/hobbies. After pre-op clearance the patient was taken to the operating room on 05/13/2022 and underwent  Procedure(s): TOTAL HIP ARTHROPLASTY ANTERIOR APPROACH.    Patient was given perioperative antibiotics:  Anti-infectives (From admission, onward)    Start     Dose/Rate Route Frequency Ordered Stop   05/14/22 0600  ceFAZolin (ANCEF) IVPB 2g/100 mL premix        2 g 200 mL/hr over 30 Minutes Intravenous On call to O.R. 05/13/22 1215 05/13/22 1456   05/14/22 0000  cefadroxil (DURICEF) 500 MG capsule        500 mg Oral 2 times daily 05/14/22 0831 05/21/22 2359   05/13/22 2000  ceFAZolin (ANCEF) IVPB 2g/100 mL premix        2 g 200 mL/hr over 30  Minutes Intravenous Every 6 hours 05/13/22 1811 05/14/22 0405        Patient was given sequential compression devices, early ambulation, and chemoprophylaxis to prevent DVT. Patient worked with PT and was meeting their goals regarding safe ambulation and transfers.  Patient benefited maximally from hospital stay and there were no complications.    Recent vital signs: No data found.   Recent laboratory studies: No results for input(s): "WBC", "HGB", "HCT", "PLT", "NA", "K", "CL", "CO2", "BUN", "CREATININE", "GLUCOSE", "INR", "CALCIUM" in the last 72 hours.  Invalid input(s): "PT", "2"   Discharge Medications:   Allergies as of 05/14/2022       Reactions   Aspirin    Upsets stomach in large doses   Depakote [divalproex Sodium] Other (See Comments)   Hair fell out   Ibuprofen Nausea And Vomiting   Prednisone Other (See Comments)   Constipation, nervousness All steroids   Latex Rash        Medication List     STOP taking these medications    acetaminophen-codeine 300-30 MG tablet Commonly known as: TYLENOL #3   diclofenac 75 MG EC tablet Commonly known as: VOLTAREN   naproxen sodium 220 MG tablet Commonly known as: ALEVE   oxyCODONE 5 MG immediate release tablet Commonly known as: Oxy IR/ROXICODONE  TAKE these medications    Accu-Chek FastClix Lancets Misc use 1 strip to check blood sugar up to 7 times a week   Accu-Chek Guide test strip Generic drug: glucose blood USE TO CHECK BLOOD GLUCOSE UP TO 7 TIMES A WEEK.   acetaminophen 500 MG tablet Commonly known as: TYLENOL Take 2 tablets (1,000 mg total) by mouth every 6 (six) hours.   aspirin 81 MG chewable tablet Chew 1 tablet (81 mg total) by mouth 2 (two) times daily for 28 days.   atorvastatin 80 MG tablet Commonly known as: LIPITOR Take 1 tablet (80 mg total) by mouth daily.   B-12 PO Take 1,000 mcg by mouth 2 (two) times daily. Notes to patient: Resume home regimen   celecoxib 200 MG  capsule Commonly known as: CELEBREX Take 1 capsule (200 mg total) by mouth daily. What changed: when to take this   diphenhydrAMINE 25 MG tablet Commonly known as: BENADRYL Take 50 mg by mouth daily. Notes to patient: Resume home regimen   docusate sodium 100 MG capsule Commonly known as: COLACE Take 1 capsule (100 mg total) by mouth 2 (two) times daily. What changed: when to take this   Dry Eye Relief Drops 0.2-0.2-1 % Soln Generic drug: Glycerin-Hypromellose-PEG 400 Place 1 drop into both eyes in the morning and at bedtime. Notes to patient: Resume home regimen   ferrous sulfate 325 (65 FE) MG tablet Take 325 mg by mouth daily with breakfast.   fluticasone 50 MCG/ACT nasal spray Commonly known as: FLONASE Place 1 spray into both nostrils daily as needed for allergies or rhinitis Notes to patient: Resume home regimen   IBgard 90 MG Cpcr Generic drug: Peppermint Oil Take as directed   lamoTRIgine 100 MG tablet Commonly known as: LaMICtal Take 1 tablet (100 mg total) by mouth daily.   Linzess 145 MCG Caps capsule Generic drug: linaclotide Take 1 capsule (145 mcg total) by mouth daily before breakfast.   metFORMIN 500 MG 24 hr tablet Commonly known as: GLUCOPHAGE-XR Take 1 tablet (500 mg total) by mouth daily with breakfast.   methocarbamol 500 MG tablet Commonly known as: ROBAXIN Take 1 tablet (500 mg total) by mouth every 6 (six) hours as needed for muscle spasms. What changed: reasons to take this Notes to patient: Last dose given 09/13 06:17am   ondansetron 4 MG tablet Commonly known as: ZOFRAN Take 1 tablet (4 mg total) by mouth every 6 (six) hours as needed for nausea.   Ozempic (0.25 or 0.5 MG/DOSE) 2 MG/3ML Sopn Generic drug: Semaglutide(0.25 or 0.'5MG'$ /DOS) Inject 0.5 mg into the skin once a week. Notes to patient: Resume home regimen   polyethylene glycol 17 g packet Commonly known as: MIRALAX / GLYCOLAX Mix 1 packet (17 g total) in 8 oz clear liquid  and drink by mouth daily as needed for mild constipation. Notes to patient: Resume home regimen   prazosin 1 MG capsule Commonly known as: MINIPRESS Take 2 capsules (2 mg total) by mouth at bedtime.   predniSONE 5 MG tablet Commonly known as: DELTASONE Take as directed for 12 days. Take 6 tablets by mouth daily on days 1-4, take 4 tablets daily on days 5-8, and take 2 tablets by mouth daily on days 9-12.   pregabalin 75 MG capsule Commonly known as: LYRICA Take 1 capsule (75 mg total) by mouth at bedtime as needed. What changed: when to take this Notes to patient: Last dose given 09/12 10:12am   triamcinolone cream 0.1 % Commonly  known as: KENALOG Apply 1 application topically daily. Apply underneath breast as needed Notes to patient: Resume home regimen   Vitamin D (Ergocalciferol) 1.25 MG (50000 UNIT) Caps capsule Commonly known as: DRISDOL Take 1 capsule (50,000 Units total) by mouth every 7 (seven) days. Notes to patient: Resume home regimen       ASK your doctor about these medications    cefadroxil 500 MG capsule Commonly known as: DURICEF Take 1 capsule (500 mg total) by mouth 2 (two) times daily for 7 days. Ask about: Should I take this medication?               Discharge Care Instructions  (From admission, onward)           Start     Ordered   05/14/22 0000  Change dressing       Comments: Maintain surgical dressing until follow up in the clinic. If the edges start to pull up, may reinforce with tape. If the dressing is no longer working, may remove and cover with gauze and tape, but must keep the area dry and clean.  Call with any questions or concerns.   05/14/22 0751            Diagnostic Studies: DG Pelvis Portable  Result Date: 05/13/2022 CLINICAL DATA:  Status post left hip arthroplasty EXAM: PORTABLE PELVIS 1-2 VIEWS COMPARISON:  None Available. FINDINGS: There is evidence of recent left hip arthroplasty. No fracture is seen. There are  pockets of air in the soft tissues. Degenerative changes are noted in lumbar spine. Vascular calcifications are seen in soft tissues. IMPRESSION: Status post left hip arthroplasty. Electronically Signed   By: Elmer Picker M.D.   On: 05/13/2022 17:13   DG HIP UNILAT WITH PELVIS 1V LEFT  Result Date: 05/13/2022 CLINICAL DATA:  Total left hip arthroplasty. EXAM: DG HIP (WITH OR WITHOUT PELVIS) 1V*L* COMPARISON:  CT left hip 03/18/2022 FINDINGS: Images were performed intraoperatively without the presence of a radiologist. The patient is undergoing placement of total left hip arthroplasty. No hardware complication is seen. Total fluoroscopy images: 8 Total fluoroscopy time: 12 seconds Total dose: Radiation Exposure Index (as provided by the fluoroscopic device): 1.27 mGy air Kerma Please see intraoperative findings for further detail. IMPRESSION: Interval total left hip arthroplasty. Electronically Signed   By: Yvonne Kendall M.D.   On: 05/13/2022 16:04   DG C-Arm 1-60 Min-No Report  Result Date: 05/13/2022 Fluoroscopy was utilized by the requesting physician.  No radiographic interpretation.   DG C-Arm 1-60 Min-No Report  Result Date: 05/13/2022 Fluoroscopy was utilized by the requesting physician.  No radiographic interpretation.    Disposition: Discharge disposition: 01-Home or Self Care       Discharge Instructions     Call MD / Call 911   Complete by: As directed    If you experience chest pain or shortness of breath, CALL 911 and be transported to the hospital emergency room.  If you develope a fever above 101 F, pus (white drainage) or increased drainage or redness at the wound, or calf pain, call your surgeon's office.   Change dressing   Complete by: As directed    Maintain surgical dressing until follow up in the clinic. If the edges start to pull up, may reinforce with tape. If the dressing is no longer working, may remove and cover with gauze and tape, but must keep the area  dry and clean.  Call with any questions or concerns.   Constipation  Prevention   Complete by: As directed    Drink plenty of fluids.  Prune juice may be helpful.  You may use a stool softener, such as Colace (over the counter) 100 mg twice a day.  Use MiraLax (over the counter) for constipation as needed.   Diet - low sodium heart healthy   Complete by: As directed    Increase activity slowly as tolerated   Complete by: As directed    Weight bearing as tolerated with assist device (walker, cane, etc) as directed, use it as long as suggested by your surgeon or therapist, typically at least 4-6 weeks.   Post-operative opioid taper instructions:   Complete by: As directed    POST-OPERATIVE OPIOID TAPER INSTRUCTIONS: It is important to wean off of your opioid medication as soon as possible. If you do not need pain medication after your surgery it is ok to stop day one. Opioids include: Codeine, Hydrocodone(Norco, Vicodin), Oxycodone(Percocet, oxycontin) and hydromorphone amongst others.  Long term and even short term use of opiods can cause: Increased pain response Dependence Constipation Depression Respiratory depression And more.  Withdrawal symptoms can include Flu like symptoms Nausea, vomiting And more Techniques to manage these symptoms Hydrate well Eat regular healthy meals Stay active Use relaxation techniques(deep breathing, meditating, yoga) Do Not substitute Alcohol to help with tapering If you have been on opioids for less than two weeks and do not have pain than it is ok to stop all together.  Plan to wean off of opioids This plan should start within one week post op of your joint replacement. Maintain the same interval or time between taking each dose and first decrease the dose.  Cut the total daily intake of opioids by one tablet each day Next start to increase the time between doses. The last dose that should be eliminated is the evening dose.      TED hose    Complete by: As directed    Use stockings (TED hose) for 2 weeks on both leg(s).  You may remove them at night for sleeping.        Follow-up Information     Paralee Cancel, MD. Schedule an appointment as soon as possible for a visit in 2 week(s).   Specialty: Orthopedic Surgery Contact information: [address] [place] [address] [phone]                  Signed: Irving Copas 05/22/2022, 7:18 AM

## 2022-05-23 ENCOUNTER — Other Ambulatory Visit (HOSPITAL_COMMUNITY): Payer: Self-pay

## 2022-05-27 ENCOUNTER — Other Ambulatory Visit (HOSPITAL_COMMUNITY): Payer: Self-pay

## 2022-05-28 ENCOUNTER — Ambulatory Visit: Payer: Medicaid Other | Admitting: Student

## 2022-05-28 ENCOUNTER — Other Ambulatory Visit (HOSPITAL_COMMUNITY): Payer: Self-pay

## 2022-05-28 ENCOUNTER — Encounter: Payer: Self-pay | Admitting: Student

## 2022-05-28 VITALS — BP 132/71 | Temp 97.9°F | Ht 63.0 in | Wt 207.9 lb

## 2022-05-28 DIAGNOSIS — M25552 Pain in left hip: Secondary | ICD-10-CM | POA: Diagnosis not present

## 2022-05-28 DIAGNOSIS — D649 Anemia, unspecified: Secondary | ICD-10-CM | POA: Diagnosis present

## 2022-05-28 MED ORDER — MELOXICAM 15 MG PO TABS
15.0000 mg | ORAL_TABLET | Freq: Every day | ORAL | 2 refills | Status: DC
  Filled 2022-05-28: qty 30, 30d supply, fill #0
  Filled 2022-06-30: qty 30, 30d supply, fill #1
  Filled 2022-09-03: qty 30, 30d supply, fill #2

## 2022-05-28 MED ORDER — CEFADROXIL 500 MG PO CAPS
500.0000 mg | ORAL_CAPSULE | Freq: Two times a day (BID) | ORAL | 0 refills | Status: DC
  Filled 2022-05-28: qty 14, 7d supply, fill #0

## 2022-05-28 NOTE — Patient Instructions (Addendum)
It was a pleasure seeing you in clinic today.  I think you are doing well from surgery today. Please follow up with your surgeons later today.   We will check you blood levels today as your hgb dropped after surgery. I will call with results.  We also recommend getting you shingles and COVID booster at an outside pharmacy

## 2022-05-29 ENCOUNTER — Other Ambulatory Visit (HOSPITAL_COMMUNITY): Payer: Self-pay

## 2022-05-29 LAB — CBC
Hematocrit: 29.7 % — ABNORMAL LOW (ref 34.0–46.6)
Hemoglobin: 8.8 g/dL — ABNORMAL LOW (ref 11.1–15.9)
MCH: 25.5 pg — ABNORMAL LOW (ref 26.6–33.0)
MCHC: 29.6 g/dL — ABNORMAL LOW (ref 31.5–35.7)
MCV: 86 fL (ref 79–97)
Platelets: 428 10*3/uL (ref 150–450)
RBC: 3.45 x10E6/uL — ABNORMAL LOW (ref 3.77–5.28)
RDW: 15.2 % (ref 11.7–15.4)
WBC: 8.3 10*3/uL (ref 3.4–10.8)

## 2022-05-31 NOTE — Assessment & Plan Note (Signed)
Patient underwent left total hip arthroplasty with Dr. Alvan Dame on 05/13/22. Reports procedure went well. Has follow up with orthopedic office this afternoon at 4 PM. Reports doing hip exercises at home. Felt pulling sensation last week. Noted small amount of blood on dressing. Not expanded since then. Feels pain well controlled. Wound site looks clean without hematoma or signs of infection. Appears hgb dropped 12.4 to 8.5 after surgery. Likely represents surgical blood loss.   --Repeat CBC today --Follow up with orthopedic surgery

## 2022-05-31 NOTE — Progress Notes (Signed)
Established Patient Office Visit  Subjective   Patient ID: Meghan Wilson, female    DOB: Apr 02, 1961  Age: 61 y.o. MRN: [id number]  Chief Complaint  Patient presents with   Follow-up    FOLLOW UP AFTER SURGERY / DM  / MEDICATION REFILL / SURGICAL PAIN / FLU SHOT.    Meghan Wilson is a 61 yo female who presents today for follow up for left hip replacement.     Patient Active Problem List   Diagnosis Date Noted   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   OSA (obstructive sleep apnea) 04/07/2022   Anemia 04/07/2022   Pain of left hip joint 02/17/2022   Shortened PR interval 11/21/2021   S/P total knee arthroplasty, 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   Chronic knee pain after total replacement of right knee joint 10/02/2021   S/P hardware removal, right tibia 09/17/2021   Pain in joint of right shoulder 09/16/2021   Aortic atherosclerosis ([place]) 07/08/2021   Depression 06/27/2021   Obesity (BMI 35.0-39.9 without comorbidity) 06/27/2021   Generalized abdominal discomfort 05/06/2021   Vaginal atrophy 11/09/2020   Type II diabetes mellitus ([place]) 02/16/2019   Healthcare maintenance 01/02/2018   Hyperlipidemia 01/01/2018   Peptic gastritis 01/01/2018   Bipolar disorder ([place]) 08/03/2015      Review of Systems  Musculoskeletal:  Positive for joint pain.  All other systems reviewed and are negative.     Objective:     BP 132/71 (BP Location: Right Arm, Patient Position: Sitting, Cuff Size: Normal)   Temp 97.9 F (36.6 C) (Oral)   Ht '5\' 3"'$  (1.6 m)   Wt 207 lb 14.4 oz (94.3 kg)   SpO2 100%   BMI 36.83 kg/m  Wt Readings from Last 3 Encounters:  05/28/22 207 lb 14.4 oz (94.3 kg)  05/13/22 193 lb 3.2 oz (87.6 kg)  05/08/22 193 lb 3.2 oz (87.6 kg)      Physical Exam Constitutional:      Appearance: Normal appearance.  HENT:     Mouth/Throat:     Mouth: Mucous membranes are moist.     Pharynx: Oropharynx is clear.  Cardiovascular:     Rate and Rhythm: Normal rate and regular  rhythm.  Pulmonary:     Effort: Pulmonary effort is normal.     Breath sounds: No rhonchi or rales.  Abdominal:     General: Abdomen is flat. Bowel sounds are normal. There is no distension.     Palpations: Abdomen is soft.     Tenderness: There is no abdominal tenderness.  Musculoskeletal:        General: Normal range of motion.     Right lower leg: No edema.     Left lower leg: No edema.     Comments: Dressing over left hip that appears clean and intact. Small amount of old blood underneath dressing. No hematoma or erythema.  Skin:    General: Skin is warm and dry.     Capillary Refill: Capillary refill takes less than 2 seconds.  Neurological:     General: No focal deficit present.     Mental Status: She is alert and oriented to person, place, and time.     Comments: Gait antalgic  Psychiatric:        Mood and Affect: Mood normal.        Behavior: Behavior normal.      Results for orders placed or performed in visit on 05/28/22  CBC no Diff  Result Value Ref Range  WBC 8.3 3.4 - 10.8 x10E3/uL   RBC 3.45 (L) 3.77 - 5.28 x10E6/uL   Hemoglobin 8.8 (L) 11.1 - 15.9 g/dL   Hematocrit 29.7 (L) 34.0 - 46.6 %   MCV 86 79 - 97 fL   MCH 25.5 (L) 26.6 - 33.0 pg   MCHC 29.6 (L) 31.5 - 35.7 g/dL   RDW 15.2 11.7 - 15.4 %   Platelets 428 150 - 450 x10E3/uL    Last CBC Lab Results  Component Value Date   WBC 8.3 05/28/2022   HGB 8.8 (L) 05/28/2022   HCT 29.7 (L) 05/28/2022   MCV 86 05/28/2022   MCH 25.5 (L) 05/28/2022   RDW 15.2 05/28/2022   PLT 428 05/28/2022      The 10-year ASCVD risk score (Arnett DK, et al., 2019) is: 13.4%    Assessment & Plan:   Problem List Items Addressed This Visit       Other   Pain of left hip joint    Patient underwent left total hip arthroplasty with Dr. Alvan Dame on 05/13/22. Reports procedure went well. Has follow up with orthopedic office this afternoon at 4 PM. Reports doing hip exercises at home. Felt pulling sensation last week. Noted  small amount of blood on dressing. Not expanded since then. Feels pain well controlled. Wound site looks clean without hematoma or signs of infection. Appears hgb dropped 12.4 to 8.5 after surgery. Likely represents surgical blood loss.   --Repeat CBC today --Follow up with orthopedic surgery      Anemia - Primary   Relevant Orders   CBC no Diff (Completed)    Return in about 4 weeks (around 06/25/2022) for diabetes follow up.    Iona Beard, MD

## 2022-06-02 ENCOUNTER — Other Ambulatory Visit (HOSPITAL_COMMUNITY): Payer: Self-pay

## 2022-06-03 NOTE — Progress Notes (Signed)
Internal Medicine Clinic Attending ? ?Case discussed with Dr. Liang  At the time of the visit.  We reviewed the resident?s history and exam and pertinent patient test results.  I agree with the assessment, diagnosis, and plan of care documented in the resident?s note. ? ?

## 2022-06-06 ENCOUNTER — Other Ambulatory Visit (HOSPITAL_COMMUNITY): Payer: Self-pay

## 2022-06-06 NOTE — Telephone Encounter (Signed)
Error

## 2022-06-09 ENCOUNTER — Other Ambulatory Visit (HOSPITAL_COMMUNITY): Payer: Self-pay

## 2022-06-09 ENCOUNTER — Ambulatory Visit (INDEPENDENT_AMBULATORY_CARE_PROVIDER_SITE_OTHER): Payer: Medicaid Other | Admitting: Family Medicine

## 2022-06-09 ENCOUNTER — Encounter (INDEPENDENT_AMBULATORY_CARE_PROVIDER_SITE_OTHER): Payer: Self-pay | Admitting: Family Medicine

## 2022-06-09 VITALS — BP 118/71 | HR 95 | Temp 97.9°F | Ht 63.0 in | Wt 191.0 lb

## 2022-06-09 DIAGNOSIS — E1169 Type 2 diabetes mellitus with other specified complication: Secondary | ICD-10-CM | POA: Diagnosis not present

## 2022-06-09 DIAGNOSIS — Z7985 Long-term (current) use of injectable non-insulin antidiabetic drugs: Secondary | ICD-10-CM

## 2022-06-09 DIAGNOSIS — E559 Vitamin D deficiency, unspecified: Secondary | ICD-10-CM | POA: Diagnosis not present

## 2022-06-09 DIAGNOSIS — Z7984 Long term (current) use of oral hypoglycemic drugs: Secondary | ICD-10-CM

## 2022-06-09 DIAGNOSIS — E669 Obesity, unspecified: Secondary | ICD-10-CM

## 2022-06-09 DIAGNOSIS — Z6833 Body mass index (BMI) 33.0-33.9, adult: Secondary | ICD-10-CM | POA: Diagnosis not present

## 2022-06-09 MED ORDER — VITAMIN D (ERGOCALCIFEROL) 1.25 MG (50000 UNIT) PO CAPS
50000.0000 [IU] | ORAL_CAPSULE | ORAL | 0 refills | Status: DC
  Filled 2022-06-09: qty 5, 35d supply, fill #0

## 2022-06-09 MED ORDER — OXYCODONE HCL 5 MG PO TABS
5.0000 mg | ORAL_TABLET | Freq: Three times a day (TID) | ORAL | 0 refills | Status: DC | PRN
  Filled 2022-06-09: qty 42, 7d supply, fill #0

## 2022-06-09 MED ORDER — OZEMPIC (0.25 OR 0.5 MG/DOSE) 2 MG/3ML ~~LOC~~ SOPN
0.5000 mg | PEN_INJECTOR | SUBCUTANEOUS | 0 refills | Status: DC
  Filled 2022-06-09: qty 3, 28d supply, fill #0

## 2022-06-11 NOTE — Progress Notes (Signed)
Chief Complaint:   OBESITY Meghan Wilson is here to discuss her progress with her obesity treatment plan along with follow-up of her obesity related diagnoses. Meghan Wilson is on the Category 2 Plan and states she is following her eating plan approximately 0% of the time. Meghan Wilson states she is exercising 0 minutes 0 times per week.  Today's visit was #: 10 Starting weight: 195 lbs Starting date: 11/21/2021 Today's weight: 191 lbs Today's date: 06/09/2022 Total lbs lost to date: 4 lbs Total lbs lost since last in-office visit: 0  Interim History: Meghan Wilson had a left hip replacement 4 weeks ago and is starting to ambulate again after surgery. Feeling a bit of depression from not working and being homebound. Planning on restocking her groceries in 2 days. Had to resign from her job due to health issues.   Subjective:   1. Vitamin D deficiency Meghan Wilson is currently taking prescription Vit D 50,000 IU once a week. Denies any nausea, vomiting or muscle weakness. She notes fatigue. Vit d level of 34.2  2. Type 2 diabetes mellitus with other specified complication, without long-term current use of insulin (HCC) Meghan Wilson is on Ozempic and Metformin. Her last A1c 6.2---will need to wait to redraw A1c as she is significant anemic.  Assessment/Plan:   1. Vitamin D deficiency We will refill Vit D 50k IU once a week for 1 month with 0 refills.  -Refill Vitamin D, Ergocalciferol, (DRISDOL) 1.25 MG (50000 UNIT) CAPS capsule; Take 1 capsule (50,000 Units total) by mouth every 7 (seven) days.  Dispense: 5 capsule; Refill: 0  2. Type 2 diabetes mellitus with other specified complication, without long-term current use of insulin (HCC) We will refill Ozempic 0.5 mg SubQ once a week for 1 month with 0 refills.  -Refill Semaglutide,0.25 or 0.'5MG'$ /DOS, (OZEMPIC, 0.25 OR 0.5 MG/DOSE,) 2 MG/3ML SOPN; Inject 0.5 mg into the skin once a week.  Dispense: 3 mL; Refill: 0  3. Obesity, Current BMI of 33.9 Meghan Wilson is currently  in the action stage of change. As such, her goal is to continue with weight loss efforts. She has agreed to the Category 2 Plan.   Exercise goals: No exercise has been prescribed at this time.  Behavioral modification strategies: increasing lean protein intake, meal planning and cooking strategies, keeping healthy foods in the home, and planning for success.  Meghan Wilson has agreed to follow-up with our clinic in 3 weeks. She was informed of the importance of frequent follow-up visits to maximize her success with intensive lifestyle modifications for her multiple health conditions.   Objective:   Blood pressure 118/71, pulse 95, temperature 97.9 F (36.6 C), height '5\' 3"'$  (1.6 m), weight 191 lb (86.6 kg), SpO2 97 %. Body mass index is 33.83 kg/m.  General: Cooperative, alert, well developed, in no acute distress. HEENT: Conjunctivae and lids unremarkable. Cardiovascular: Regular rhythm.  Lungs: Normal work of breathing. Neurologic: No focal deficits.   Lab Results  Component Value Date   CREATININE 0.49 05/14/2022   BUN 8 05/14/2022   NA 140 05/14/2022   K 3.9 05/14/2022   CL 109 05/14/2022   CO2 25 05/14/2022   Lab Results  Component Value Date   ALT 24 03/18/2022   AST 13 (L) 03/18/2022   ALKPHOS 71 03/18/2022   BILITOT 0.1 (L) 03/18/2022   Lab Results  Component Value Date   HGBA1C 6.0 (A) 04/07/2022   HGBA1C 5.9 (A) 12/18/2021   HGBA1C 6.2 (A) 08/22/2021   HGBA1C 6.3 (A) 05/02/2021  HGBA1C 5.9 (A) 10/12/2020   Lab Results  Component Value Date   INSULIN 5.8 11/21/2021   Lab Results  Component Value Date   TSH 0.976 11/21/2021   Lab Results  Component Value Date   CHOL 137 11/21/2021   HDL 57 11/21/2021   LDLCALC 63 11/21/2021   TRIG 87 11/21/2021   CHOLHDL 2.7 05/02/2021   Lab Results  Component Value Date   VD25OH 34.2 11/21/2021   Lab Results  Component Value Date   WBC 8.3 05/28/2022   HGB 8.8 (L) 05/28/2022   HCT 29.7 (L) 05/28/2022   MCV 86  05/28/2022   PLT 428 05/28/2022   Lab Results  Component Value Date   IRON 81 03/05/2022   TIBC 392 03/05/2022   FERRITIN 68 03/05/2022   Attestation Statements:   Reviewed by clinician on day of visit: allergies, medications, problem list, medical history, surgical history, family history, social history, and previous encounter notes.  I, Elnora Morrison, RMA am acting as transcriptionist for Coralie Common, MD.  I have reviewed the above documentation for accuracy and completeness, and I agree with the above. - Coralie Common, MD

## 2022-06-19 ENCOUNTER — Other Ambulatory Visit (HOSPITAL_COMMUNITY): Payer: Self-pay

## 2022-06-19 MED ORDER — PRAZOSIN HCL 1 MG PO CAPS
3.0000 mg | ORAL_CAPSULE | Freq: Every day | ORAL | 2 refills | Status: DC
  Filled 2022-06-19: qty 90, 30d supply, fill #0

## 2022-06-19 MED ORDER — DOXEPIN HCL 10 MG PO CAPS
10.0000 mg | ORAL_CAPSULE | Freq: Every evening | ORAL | 3 refills | Status: DC | PRN
  Filled 2022-06-19: qty 30, 30d supply, fill #0

## 2022-06-19 MED ORDER — OXYCODONE HCL 5 MG PO TABS
5.0000 mg | ORAL_TABLET | Freq: Three times a day (TID) | ORAL | 0 refills | Status: DC | PRN
  Filled 2022-06-19: qty 30, 5d supply, fill #0

## 2022-06-19 MED ORDER — LAMOTRIGINE 100 MG PO TABS
100.0000 mg | ORAL_TABLET | Freq: Two times a day (BID) | ORAL | 3 refills | Status: DC
  Filled 2022-06-19: qty 60, 30d supply, fill #0

## 2022-06-23 ENCOUNTER — Other Ambulatory Visit (HOSPITAL_COMMUNITY): Payer: Self-pay

## 2022-06-23 LAB — HM DIABETES EYE EXAM

## 2022-06-23 MED ORDER — CYCLOSPORINE 0.05 % OP EMUL
1.0000 [drp] | Freq: Two times a day (BID) | OPHTHALMIC | 3 refills | Status: DC
  Filled 2022-06-23: qty 180, 90d supply, fill #0
  Filled 2022-11-13: qty 180, 90d supply, fill #1

## 2022-06-25 ENCOUNTER — Ambulatory Visit (INDEPENDENT_AMBULATORY_CARE_PROVIDER_SITE_OTHER): Payer: Medicaid Other | Admitting: Family Medicine

## 2022-06-25 ENCOUNTER — Encounter (INDEPENDENT_AMBULATORY_CARE_PROVIDER_SITE_OTHER): Payer: Self-pay | Admitting: Family Medicine

## 2022-06-25 ENCOUNTER — Other Ambulatory Visit (HOSPITAL_COMMUNITY): Payer: Self-pay

## 2022-06-25 ENCOUNTER — Ambulatory Visit: Payer: Medicaid Other

## 2022-06-25 VITALS — BP 119/81 | HR 97 | Temp 98.1°F | Ht 63.0 in | Wt 197.0 lb

## 2022-06-25 VITALS — BP 122/79 | HR 90 | Temp 98.4°F | Ht 63.0 in | Wt 191.0 lb

## 2022-06-25 DIAGNOSIS — Z7985 Long-term (current) use of injectable non-insulin antidiabetic drugs: Secondary | ICD-10-CM

## 2022-06-25 DIAGNOSIS — E669 Obesity, unspecified: Secondary | ICD-10-CM

## 2022-06-25 DIAGNOSIS — H9311 Tinnitus, right ear: Secondary | ICD-10-CM | POA: Diagnosis not present

## 2022-06-25 DIAGNOSIS — E559 Vitamin D deficiency, unspecified: Secondary | ICD-10-CM

## 2022-06-25 DIAGNOSIS — R519 Headache, unspecified: Secondary | ICD-10-CM

## 2022-06-25 DIAGNOSIS — Z6834 Body mass index (BMI) 34.0-34.9, adult: Secondary | ICD-10-CM

## 2022-06-25 DIAGNOSIS — K5909 Other constipation: Secondary | ICD-10-CM | POA: Diagnosis not present

## 2022-06-25 DIAGNOSIS — D509 Iron deficiency anemia, unspecified: Secondary | ICD-10-CM | POA: Diagnosis not present

## 2022-06-25 DIAGNOSIS — E1169 Type 2 diabetes mellitus with other specified complication: Secondary | ICD-10-CM

## 2022-06-25 DIAGNOSIS — Z6835 Body mass index (BMI) 35.0-35.9, adult: Secondary | ICD-10-CM

## 2022-06-25 DIAGNOSIS — E119 Type 2 diabetes mellitus without complications: Secondary | ICD-10-CM

## 2022-06-25 DIAGNOSIS — Z1231 Encounter for screening mammogram for malignant neoplasm of breast: Secondary | ICD-10-CM

## 2022-06-25 MED ORDER — OZEMPIC (0.25 OR 0.5 MG/DOSE) 2 MG/3ML ~~LOC~~ SOPN
0.5000 mg | PEN_INJECTOR | SUBCUTANEOUS | 0 refills | Status: DC
  Filled 2022-06-25: qty 3, 28d supply, fill #0

## 2022-06-25 MED ORDER — VITAMIN D (ERGOCALCIFEROL) 1.25 MG (50000 UNIT) PO CAPS
50000.0000 [IU] | ORAL_CAPSULE | ORAL | 0 refills | Status: DC
  Filled 2022-06-25: qty 4, 28d supply, fill #0

## 2022-06-25 MED ORDER — SEMAGLUTIDE (1 MG/DOSE) 4 MG/3ML ~~LOC~~ SOPN
1.0000 mg | PEN_INJECTOR | SUBCUTANEOUS | 1 refills | Status: DC
  Filled 2022-06-25: qty 3, 28d supply, fill #0

## 2022-06-25 NOTE — Patient Instructions (Signed)
Thank you, Ms.Jozey Steptoe for allowing [place] to provide your care today. Today we discussed :  Ringing of ears- This is called tinnitus and can be from a viral infection or from more serious things like inflammation of the blood vessels. We will get labs to check for inflammation. We will also see if this could be from your anemia. If these labs are normal we will get head imaging and refer you to ENT.  Diabetes- We will increase ozempic to 1 mg weekly for 4 weeks.  I have ordered the following labs for you:   Lab Orders         CBC no Diff         Sed Rate (ESR)         CRP (C-Reactive Protein)         Iron, TIBC and Ferritin Panel        Referrals ordered today:   Referral Orders  No referral(s) requested today     I have ordered the following medication/changed the following medications:   Stop the following medications: Medications Discontinued During This Encounter  Medication Reason   Semaglutide,0.25 or 0.5MG/DOS, (OZEMPIC, 0.25 OR 0.5 MG/DOSE,) 2 MG/3ML SOPN Dose change     Start the following medications: Meds ordered this encounter  Medications   Semaglutide, 1 MG/DOSE, 4 MG/3ML SOPN    Sig: Inject 1 mg into the skin once a week.    Dispense:  3 mL    Refill:  1     Follow up: 3 months     We look forward to seeing you next time. Please call our clinic at [phone] if you have any questions or concerns. The best time to call is Monday-Friday from 9am-4pm, but there is someone available 24/7. If after hours or the weekend, call the main hospital number and ask for the Internal Medicine Resident On-Call. If you need medication refills, please notify your pharmacy one week in advance and they will send [place] a request.   Thank you for trusting me with your care. Wishing you the best!   Iona Coach, MD [place]

## 2022-06-25 NOTE — Progress Notes (Unsigned)
Gastritis H pylori found and treated in 2022. Restarted on PPI. GI follow up? Symptoms?  T2DM A1c 11/2021 5.9, 04/2022 6.0 Hold off on A1c given anemia -metformin 500 daily  HLD LDL 63 10/2021 within goal for primary prevention -atorvastatin 80  Bipolar disorder Lamotrigine 100 BID  Depression Lamotrigine, prazosin  Obesity Weight 194 lbs 04/2022 Weight clinic? -ozempic 0.5 increase to 1.0x 4 weeks, then 1.'7mg'$  x4 weeks, then 2.4  Vitamin D deficiency 50K units weekly x 4 weeks. [facility]  S/p total L hip arthroplasty  Dr. Alvan Dame on 05/13/22. Anemic to 8.5 following surgery. Repeat hemoglobin 8.8 normocytic with reactive thombocytosis to 428. -retic count, haptoglobin, LDH, bili  HCM: Optho flu

## 2022-06-26 ENCOUNTER — Telehealth: Payer: Self-pay | Admitting: *Deleted

## 2022-06-26 ENCOUNTER — Other Ambulatory Visit: Payer: Self-pay | Admitting: Internal Medicine

## 2022-06-26 DIAGNOSIS — H9313 Tinnitus, bilateral: Secondary | ICD-10-CM | POA: Insufficient documentation

## 2022-06-26 DIAGNOSIS — H9311 Tinnitus, right ear: Secondary | ICD-10-CM | POA: Insufficient documentation

## 2022-06-26 DIAGNOSIS — H9319 Tinnitus, unspecified ear: Secondary | ICD-10-CM

## 2022-06-26 LAB — SEDIMENTATION RATE: Sed Rate: 44 mm/hr — ABNORMAL HIGH (ref 0–40)

## 2022-06-26 LAB — CBC
Hematocrit: 38.5 % (ref 34.0–46.6)
Hemoglobin: 11.9 g/dL (ref 11.1–15.9)
MCH: 24.9 pg — ABNORMAL LOW (ref 26.6–33.0)
MCHC: 30.9 g/dL — ABNORMAL LOW (ref 31.5–35.7)
MCV: 81 fL (ref 79–97)
Platelets: 398 10*3/uL (ref 150–450)
RBC: 4.78 x10E6/uL (ref 3.77–5.28)
RDW: 13.5 % (ref 11.7–15.4)
WBC: 9.7 10*3/uL (ref 3.4–10.8)

## 2022-06-26 LAB — IRON,TIBC AND FERRITIN PANEL
Ferritin: 81 ng/mL (ref 15–150)
Iron Saturation: 7 % — CL (ref 15–55)
Iron: 24 ug/dL — ABNORMAL LOW (ref 27–139)
Total Iron Binding Capacity: 351 ug/dL (ref 250–450)
UIBC: 327 ug/dL (ref 118–369)

## 2022-06-26 LAB — C-REACTIVE PROTEIN: CRP: 3 mg/L (ref 0–10)

## 2022-06-26 NOTE — Telephone Encounter (Signed)
Call from pt stating the ringing in her ears is worse. "Extra loud today"; "Feels like I'm going crazy". Also she  states her neck is sore. She was seen yesterday by Dr Stann Mainland.  Also wanting to know blood test results. She states "something has to be done".

## 2022-06-26 NOTE — Assessment & Plan Note (Signed)
Most recent A1c 04/2022 was 6.0. Patient was found to have anemia earlier this month so did not check A1c at this visit. Her anemia has resolved per CBC attained at this visit so will plan an A1c at her next visit. -Continue metformin 500 mg daily -ozempic 0.5 increased to 1.0x 4 weeks this visit, then 1.'7mg'$  x4 weeks, then 2.4 as tolerated

## 2022-06-26 NOTE — Telephone Encounter (Signed)
Thank you Dr Elliot Gurney.

## 2022-06-26 NOTE — Telephone Encounter (Signed)
Called patient. Informed her that iron is quite low and that she would benefit from iron replacement. She also reports that her ear has been sore/throbbing, dizziness/nausea, and that she has noticed some phlegm production that has been ongoing since ringing started. She says this feels the same as her episode from 2017. She states that the treatment she received then helped. Upon chart review, it appears as though she was treated with prednisone, flonase, and antibiotics. Will discuss with attending provider for consideration of flonase and possible steroids. She would also benefit from treatment of her iron deficiency which was discussed with her.

## 2022-06-26 NOTE — Assessment & Plan Note (Signed)
S/p total L hip arthroplasty  With Dr. Alvan Dame on 05/13/22 and found to be annemic to 8.5 following surgery. Repeat hemoglobin 8.8,normocytic with reactive thombocytosis to 428 at office visit in early October 2023. This was thought to be blood loss anemia from surgery.She has been seen by hem/onc in the past for iron deficiency anemia and they recommend q4 month ferritin, TIBC/iron studies. Anemia mostly resolved with normocytic RBCs and hgb of 11.9. Ferritin, TIBC both normal. Iron minimally decreased to 24. Although saturation is 7% this does not appear to be overt iron deficiency given normal stores, normocytic, and normal TIBC. Can supplement with every other day iron. Patient of note did have a normal colonoscopy last year. -ferrous sulfate 325 every other day

## 2022-06-26 NOTE — Assessment & Plan Note (Addendum)
Patient reports high pitched non-pulsatile tinnitus in her R ear since Sunday. She says she has associated light headedness when standing, R temporal headache, sore throat, and R eye crusting. She denies associated hearing loss, changes in vision, focal weakness or sensory changes, urinary symptoms, URI symptoms. On exam her R TM is clear without erythema, bulging, drainage or effusion. She has no tenderness to palpation over the sinuses. It appears in 2017 she was treated for a middle ear effusion causing R tinnitus and prednisone, antibiotics, flonase helped. She was seen multiple times in 2019 for R tinnitus including by neurology. CT head showed questionable R CP angle findings, and later MRI demonstrated no CP angle pathology and suggested CT artifact. There was no intracranial findings other than R maxillary sinusitis. She was referred to ENT per the neurology note but I do not see any encounter for this. Do not think she needs head imaging at this time as her CRP was normal, ESR was borderline normal at 44 with no ptp or chord like structures over the R temporal region and no shoulder pain or weakness to suggest vasculitis, and she has had prior head imaging for similar complaints with no other focal neurological deficits. This does not appear to be symptomatic anemia as her anemia has resolved. She does take ototoxic medications such as PPI and prazosin, but the acuity, long standing cyclical nature, and association with viral symptoms makes this less likely. Given her throat pain, eye crusting and prior sinusitis, she may be having eustachian tube dysfunction or vestibular neuritis from URI. -ENT referral

## 2022-06-26 NOTE — Progress Notes (Signed)
Established Patient Office Visit  Subjective   Patient ID: Meghan Wilson, female    DOB: 07/21/1961  Age: 61 y.o. MRN: [id number]  Chief Complaint  Patient presents with   Follow-up    Diabetes.   Tinnitus    ENT referral.    [name] is a 61 y/o female with a pmh outlined below. Please see encounter tab for HPI and A/P information.      Review of Systems  All other systems reviewed and are negative.     Objective:     BP 119/81 (BP Location: Right Arm, Patient Position: Sitting, Cuff Size: Normal)   Pulse 97   Temp 98.1 F (36.7 C) (Oral)   Ht _0  (1.6 m)   Wt 197 lb (89.4 kg)   SpO2 100% Comment: RA  BMI 34.90 kg/m    Physical Exam Constitutional:      General: She is in acute distress.     Appearance: Normal appearance. She is obese. She is not ill-appearing, toxic-appearing or diaphoretic.  HENT:     Head: Normocephalic and atraumatic.     Comments: No ptp over the frontal or maxillary sinuses. No ptp over the R temporal region, no chord like structures palpable.    Right Ear: Tympanic membrane, ear canal and external ear normal. There is no impacted cerumen.  Eyes:     Extraocular Movements: Extraocular movements intact.     Conjunctiva/sclera: Conjunctivae normal.     Pupils: Pupils are equal, round, and reactive to light.  Cardiovascular:     Rate and Rhythm: Normal rate and regular rhythm.     Pulses: Normal pulses.     Heart sounds: Normal heart sounds. No murmur heard.    No gallop.  Pulmonary:     Effort: Pulmonary effort is normal. No respiratory distress.     Breath sounds: Normal breath sounds. No wheezing or rales.  Musculoskeletal:     Right lower leg: No edema.     Left lower leg: No edema.  Skin:    General: Skin is warm and dry.     Capillary Refill: Capillary refill takes less than 2 seconds.  Neurological:     General: No focal deficit present.     Mental Status: She is alert.     Cranial Nerves: No cranial nerve deficit.      Sensory: No sensory deficit.     Motor: No weakness.     Coordination: Coordination normal.      Results for orders placed or performed in visit on 06/25/22  CBC no Diff  Result Value Ref Range   WBC 9.7 3.4 - 10.8 x10E3/uL   RBC 4.78 3.77 - 5.28 x10E6/uL   Hemoglobin 11.9 11.1 - 15.9 g/dL   Hematocrit 38.5 34.0 - 46.6 %   MCV 81 79 - 97 fL   MCH 24.9 (L) 26.6 - 33.0 pg   MCHC 30.9 (L) 31.5 - 35.7 g/dL   RDW 13.5 11.7 - 15.4 %   Platelets 398 150 - 450 x10E3/uL  Sed Rate (ESR)  Result Value Ref Range   Sed Rate 44 (H) 0 - 40 mm/hr  CRP (C-Reactive Protein)  Result Value Ref Range   CRP 3 0 - 10 mg/L  Iron, TIBC and Ferritin Panel  Result Value Ref Range   Total Iron Binding Capacity 351 250 - 450 ug/dL   UIBC 327 118 - 369 ug/dL   Iron 24 (L) 27 - 139 ug/dL   Iron  Saturation 7 (LL) 15 - 55 %   Ferritin 81 15 - 150 ng/mL      The 10-year ASCVD risk score (Arnett DK, et al., 2019) is: 10.3%    Assessment & Plan:   Problem List Items Addressed This Visit       Endocrine   Type II diabetes mellitus ([place])    Most recent A1c 04/2022 was 6.0. Patient was found to have anemia earlier this month so did not check A1c at this visit. Her anemia has resolved per CBC attained at this visit so will plan an A1c at her next visit. -Continue metformin 500 mg daily -ozempic 0.5 increased to 1.0x 4 weeks this visit, then 1.59m x4 weeks, then 2.4 as tolerated      Relevant Medications   Semaglutide, 1 MG/DOSE, 4 MG/3ML SOPN     Other   Obesity (BMI 35.0-39.9 without comorbidity)   Relevant Medications   Semaglutide, 1 MG/DOSE, 4 MG/3ML SOPN   Anemia    S/p total L hip arthroplasty  With Dr. OAlvan Dameon 05/13/22 and found to be annemic to 8.5 following surgery. Repeat hemoglobin 8.8,normocytic with reactive thombocytosis to 428 at office visit in early October 2023. This was thought to be blood loss anemia from surgery.She has been seen by hem/onc in the past for iron deficiency anemia  and they recommend q4 month ferritin, TIBC/iron studies. Anemia mostly resolved with normocytic RBCs and hgb of 11.9. Ferritin, TIBC both normal. Iron minimally decreased to 24. Although saturation is 7% this does not appear to be overt iron deficiency given normal stores, normocytic, and normal TIBC. Can supplement with every other day iron. Patient of note did have a normal colonoscopy last year. -ferrous sulfate 325 every other day      Relevant Orders   CBC no Diff (Completed)   Sed Rate (ESR) (Completed)   CRP (C-Reactive Protein) (Completed)   Iron, TIBC and Ferritin Panel (Completed)   Tinnitus, right    Patient reports high pitched non-pulsatile tinnitus in her R ear since Sunday. She says she has associated light headedness when standing, R temporal headache, sore throat, and R eye crusting. She denies associated hearing loss, changes in vision, focal weakness or sensory changes, urinary symptoms, URI symptoms. On exam her R TM is clear without erythema, bulging, drainage or effusion. She has no tenderness to palpation over the sinuses. It appears in 2017 she was treated for a middle ear effusion causing R tinnitus and prednisone, antibiotics, flonase helped. She was seen multiple times in 2019 for R tinnitus including by neurology. CT head showed questionable R CP angle findings, and later MRI demonstrated no CP angle pathology and suggested CT artifact. There was no intracranial findings other than R maxillary sinusitis. She was referred to ENT per the neurology note but I do not see any encounter for this. Do not think she needs head imaging at this time as her CRP was normal, ESR was borderline normal at 44 with no ptp or chord like structures over the R temporal region and no shoulder pain or weakness to suggest vasculitis, and she has had prior head imaging for similar complaints with no other focal neurological deficits. This does not appear to be symptomatic anemia as her anemia has  resolved. She does take ototoxic medications such as PPI and prazosin, but the acuity, long standing cyclical nature, and association with viral symptoms makes this less likely. Given her throat pain, eye crusting and prior sinusitis, she may be  having eustachian tube dysfunction or vestibular neuritis from URI. -flonase and steroids -ENT referral      Other Visit Diagnoses     Tinnitus of right ear    -  Primary   Relevant Orders   CBC no Diff (Completed)   Sed Rate (ESR) (Completed)   CRP (C-Reactive Protein) (Completed)   Iron, TIBC and Ferritin Panel (Completed)   Right temporal headache       Relevant Orders   Sed Rate (ESR) (Completed)   CRP (C-Reactive Protein) (Completed)   Breast cancer screening by mammogram       Relevant Orders   MM Digital Screening       No follow-ups on file.    Iona Coach, MD

## 2022-06-30 ENCOUNTER — Other Ambulatory Visit (HOSPITAL_COMMUNITY): Payer: Self-pay

## 2022-07-01 NOTE — Progress Notes (Signed)
Internal Medicine Clinic Attending  I saw and evaluated the patient.  I personally confirmed the key portions of the history and exam documented by Dr. Rogers and I reviewed pertinent patient test results.  The assessment, diagnosis, and plan were formulated together and I agree with the documentation in the resident's note.  

## 2022-07-03 ENCOUNTER — Other Ambulatory Visit: Payer: Self-pay

## 2022-07-03 ENCOUNTER — Telehealth: Payer: Self-pay

## 2022-07-03 NOTE — Telephone Encounter (Signed)
Pt called asking to speak with Cira Rue, NP regarding her recent lab results drawn by her PCP or [facility] provider.  Pt stated the results are in her MyChart drawn on 06/25/2022 and her Iron is low.  Pt said she was calling Cira Rue, NP to see if she could get an IV Iron infusion.  Pt was last seen by Lacie in July 2023 and was to be followed by Dr. Owens Shark with labs drawn every 4 months.  Pt confirmed she's taking her oral iron and B12.  Pt stated she's real tired.  Informed pt that Regan Rakers is currently seeing pt in clinic but this RN will make her aware of her call.  Notified Lacie of the pt's call.

## 2022-07-04 ENCOUNTER — Other Ambulatory Visit (HOSPITAL_COMMUNITY): Payer: Self-pay

## 2022-07-06 ENCOUNTER — Emergency Department (HOSPITAL_COMMUNITY)
Admission: EM | Admit: 2022-07-06 | Discharge: 2022-07-06 | Discharge status: Against Medical Advice | Payer: Medicaid Other | Attending: Emergency Medicine | Admitting: Emergency Medicine

## 2022-07-06 ENCOUNTER — Encounter (HOSPITAL_COMMUNITY): Payer: Self-pay

## 2022-07-06 ENCOUNTER — Emergency Department (HOSPITAL_COMMUNITY): Payer: Medicaid Other

## 2022-07-06 DIAGNOSIS — J029 Acute pharyngitis, unspecified: Secondary | ICD-10-CM | POA: Insufficient documentation

## 2022-07-06 DIAGNOSIS — Z20822 Contact with and (suspected) exposure to covid-19: Secondary | ICD-10-CM | POA: Insufficient documentation

## 2022-07-06 DIAGNOSIS — H9312 Tinnitus, left ear: Secondary | ICD-10-CM | POA: Diagnosis not present

## 2022-07-06 DIAGNOSIS — R519 Headache, unspecified: Secondary | ICD-10-CM | POA: Diagnosis present

## 2022-07-06 DIAGNOSIS — R059 Cough, unspecified: Secondary | ICD-10-CM | POA: Diagnosis not present

## 2022-07-06 DIAGNOSIS — H9203 Otalgia, bilateral: Secondary | ICD-10-CM | POA: Insufficient documentation

## 2022-07-06 DIAGNOSIS — Z5321 Procedure and treatment not carried out due to patient leaving prior to being seen by health care provider: Secondary | ICD-10-CM | POA: Insufficient documentation

## 2022-07-06 DIAGNOSIS — R0989 Other specified symptoms and signs involving the circulatory and respiratory systems: Secondary | ICD-10-CM | POA: Diagnosis not present

## 2022-07-06 LAB — CBC WITH DIFFERENTIAL/PLATELET
Abs Immature Granulocytes: 0.06 10*3/uL (ref 0.00–0.07)
Basophils Absolute: 0.1 10*3/uL (ref 0.0–0.1)
Basophils Relative: 1 %
Eosinophils Absolute: 0.2 10*3/uL (ref 0.0–0.5)
Eosinophils Relative: 3 %
HCT: 39.9 % (ref 36.0–46.0)
Hemoglobin: 11.9 g/dL — ABNORMAL LOW (ref 12.0–15.0)
Immature Granulocytes: 1 %
Lymphocytes Relative: 32 %
Lymphs Abs: 2.3 10*3/uL (ref 0.7–4.0)
MCH: 25.1 pg — ABNORMAL LOW (ref 26.0–34.0)
MCHC: 29.8 g/dL — ABNORMAL LOW (ref 30.0–36.0)
MCV: 84.2 fL (ref 80.0–100.0)
Monocytes Absolute: 0.6 10*3/uL (ref 0.1–1.0)
Monocytes Relative: 9 %
Neutro Abs: 4 10*3/uL (ref 1.7–7.7)
Neutrophils Relative %: 54 %
Platelets: 359 10*3/uL (ref 150–400)
RBC: 4.74 MIL/uL (ref 3.87–5.11)
RDW: 14.5 % (ref 11.5–15.5)
WBC: 7.3 10*3/uL (ref 4.0–10.5)
nRBC: 0 % (ref 0.0–0.2)

## 2022-07-06 LAB — BASIC METABOLIC PANEL
Anion gap: 7 (ref 5–15)
BUN: 9 mg/dL (ref 8–23)
CO2: 26 mmol/L (ref 22–32)
Calcium: 9.3 mg/dL (ref 8.9–10.3)
Chloride: 108 mmol/L (ref 98–111)
Creatinine, Ser: 0.51 mg/dL (ref 0.44–1.00)
GFR, Estimated: >60 mL/min (ref >60)
Glucose, Bld: 100 mg/dL — ABNORMAL HIGH (ref 70–99)
Potassium: 4.1 mmol/L (ref 3.5–5.1)
Sodium: 141 mmol/L (ref 135–145)

## 2022-07-06 LAB — RESP PANEL BY RT-PCR (FLU A&B, COVID) ARPGX2
Influenza A by PCR: NEGATIVE
Influenza B by PCR: NEGATIVE
SARS Coronavirus 2 by RT PCR: NEGATIVE

## 2022-07-06 LAB — SALICYLATE LEVEL: Salicylate Lvl: <7 mg/dL — ABNORMAL LOW (ref 7.0–30.0)

## 2022-07-06 LAB — GROUP A STREP BY PCR: Group A Strep by PCR: NOT DETECTED

## 2022-07-06 NOTE — ED Triage Notes (Signed)
Pt arrived via POV, c/o sore throat, bilateral ear pain, productive cough.

## 2022-07-06 NOTE — ED Provider Triage Note (Signed)
Emergency Medicine Provider Triage Evaluation Note  Meghan Wilson , a 61 y.o. female  was evaluated in triage.  Pt complains of headache x3 weeks.  Its constant throughout the day severity waxes and wanes.  She feels like her vision is intermittently blurry.  Also has a ringing in her ear it started in her right ear and now is in her left as well.  Endorses pain in her throat mostly on the right side which is worse when she swallows.  Endorses runny nose.  No cough, shortness of breath, fevers..  Review of Systems  Per HPI  Physical Exam  BP 131/74 (BP Location: Left Arm)   Pulse 84   Temp 98.2 F (36.8 C) (Oral)   Resp 16   SpO2 100%  Gen:   Awake, no distress   Resp:  Normal effort  MSK:   Moves extremities without difficulty  Other:  Nasal congestion.  No erythema to the posterior oropharynx, TM clear bilaterally.  No tragal tenderness.  No bruits.  Cranial nerves II through XII are grossly intact.  Medical Decision Making  Medically screening exam initiated at 9:31 AM.  Appropriate orders placed.  Meghan Wilson was informed that the remainder of the evaluation will be completed by another provider, this initial triage assessment does not replace that evaluation, and the importance of remaining in the ED until their evaluation is complete.     Meghan Raring, PA-C 07/06/22 [phone]

## 2022-07-08 ENCOUNTER — Encounter: Payer: Self-pay | Admitting: Nurse Practitioner

## 2022-07-08 ENCOUNTER — Inpatient Hospital Stay: Payer: Medicaid Other | Attending: Nurse Practitioner | Admitting: Nurse Practitioner

## 2022-07-08 ENCOUNTER — Encounter: Payer: Self-pay | Admitting: Dietician

## 2022-07-08 DIAGNOSIS — D649 Anemia, unspecified: Secondary | ICD-10-CM | POA: Insufficient documentation

## 2022-07-08 DIAGNOSIS — Z90710 Acquired absence of both cervix and uterus: Secondary | ICD-10-CM

## 2022-07-08 DIAGNOSIS — J029 Acute pharyngitis, unspecified: Secondary | ICD-10-CM

## 2022-07-08 DIAGNOSIS — D508 Other iron deficiency anemias: Secondary | ICD-10-CM | POA: Diagnosis not present

## 2022-07-08 DIAGNOSIS — H9319 Tinnitus, unspecified ear: Secondary | ICD-10-CM | POA: Diagnosis not present

## 2022-07-08 DIAGNOSIS — E119 Type 2 diabetes mellitus without complications: Secondary | ICD-10-CM | POA: Insufficient documentation

## 2022-07-08 DIAGNOSIS — E669 Obesity, unspecified: Secondary | ICD-10-CM

## 2022-07-08 DIAGNOSIS — G4730 Sleep apnea, unspecified: Secondary | ICD-10-CM

## 2022-07-08 DIAGNOSIS — R519 Headache, unspecified: Secondary | ICD-10-CM

## 2022-07-08 DIAGNOSIS — I10 Essential (primary) hypertension: Secondary | ICD-10-CM | POA: Insufficient documentation

## 2022-07-08 DIAGNOSIS — R5383 Other fatigue: Secondary | ICD-10-CM | POA: Diagnosis not present

## 2022-07-08 NOTE — Progress Notes (Signed)
Chief Complaint:   OBESITY Meghan Wilson is here to discuss her progress with her obesity treatment plan along with follow-up of her obesity related diagnoses. Meghan Wilson is on the Category 2 Plan and states she is following her eating plan approximately 60% of the time. Meghan Wilson states she is not currently exercising.  Today's visit was #: 11 Starting weight: 195 lbs Starting date: 11/21/2021 Today's weight: 191 lbs Today's date: 06/25/2022 Total lbs lost to date: 4 Total lbs lost since last in-office visit: 0  Interim History: This is the patient's first office visit with me.  She has had a hip replacement on May 13, 2022 and this caused depression.  She is on Lamictal for her psychiatrist and she sees a therapist.  She either eats too much or not at all when she is depressed.  Subjective:   1. Type 2 diabetes mellitus with other specified complication, without long-term current use of insulin (HCC) Patient's fasting blood sugars range at 119.  She denies lows or symptoms of lows.  Her 2-hour postprandial ranges at 180.  2. Vitamin D deficiency She is currently taking prescription vitamin D 50,000 IU each week. She denies nausea, vomiting or muscle weakness. She notes improving energy level.   3. Other constipation Patient's GI doctor gave her Linzess recently.  She notes this has helped a lot and is working well.  Assessment/Plan:  No orders of the defined types were placed in this encounter.   Medications Discontinued During This Encounter  Medication Reason   Semaglutide,0.25 or 0.'5MG'$ /DOS, (OZEMPIC, 0.25 OR 0.5 MG/DOSE,) 2 MG/3ML SOPN Reorder   Vitamin D, Ergocalciferol, (DRISDOL) 1.25 MG (50000 UNIT) CAPS capsule Reorder     Meds ordered this encounter  Medications   Vitamin D, Ergocalciferol, (DRISDOL) 1.25 MG (50000 UNIT) CAPS capsule    Sig: Take 1 capsule (50,000 Units total) by mouth every 7 (seven) days.    Dispense:  5 capsule    Refill:  0   DISCONTD:  Semaglutide,0.25 or 0.'5MG'$ /DOS, (OZEMPIC, 0.25 OR 0.5 MG/DOSE,) 2 MG/3ML SOPN    Sig: Inject 0.5 mg into the skin once a week.    Dispense:  3 mL    Refill:  0     1. Type 2 diabetes mellitus with other specified complication, without long-term current use of insulin (HCC) Good blood sugar control is important to decrease the likelihood of diabetic complications such as nephropathy, neuropathy, limb loss, blindness, coronary artery disease, and death. Intensive lifestyle modification including diet, exercise and weight loss are the first line of treatment for diabetes.   2. Vitamin D deficiency Low Vitamin D level contributes to fatigue and are associated with obesity, breast, and colon cancer. We will refill prescription Vitamin D for 1 month. She will follow-up for routine testing of Vitamin D, at least 2-3 times per year to avoid over-replacement.  - Vitamin D, Ergocalciferol, (DRISDOL) 1.25 MG (50000 UNIT) CAPS capsule; Take 1 capsule (50,000 Units total) by mouth every 7 (seven) days.  Dispense: 5 capsule; Refill: 0  3. Other constipation Patient will continue her medications per GI.  4. Obesity, Current BMI of 34 Meghan Wilson is currently in the action stage of change. As such, her goal is to continue with weight loss efforts. She has agreed to the Category 2 Plan.   Exercise goals: As is.   Behavioral modification strategies: increasing lean protein intake and decreasing simple carbohydrates.  Meghan Wilson has agreed to follow-up with our clinic in 2 to 3 weeks.  She was informed of the importance of frequent follow-up visits to maximize her success with intensive lifestyle modifications for her multiple health conditions.   Objective:   Blood pressure 122/79, pulse 90, temperature 98.4 F (36.9 C), height '5\' 3"'$  (1.6 m), weight 191 lb (86.6 kg), SpO2 100 %. Body mass index is 33.83 kg/m.  General: Cooperative, alert, well developed, in no acute distress. HEENT: Conjunctivae and lids  unremarkable. Cardiovascular: Regular rhythm.  Lungs: Normal work of breathing. Neurologic: No focal deficits.   Lab Results  Component Value Date   CREATININE 0.51 07/06/2022   BUN 9 07/06/2022   NA 141 07/06/2022   K 4.1 07/06/2022   CL 108 07/06/2022   CO2 26 07/06/2022   Lab Results  Component Value Date   ALT 24 03/18/2022   AST 13 (L) 03/18/2022   ALKPHOS 71 03/18/2022   BILITOT 0.1 (L) 03/18/2022   Lab Results  Component Value Date   HGBA1C 6.0 (A) 04/07/2022   HGBA1C 5.9 (A) 12/18/2021   HGBA1C 6.2 (A) 08/22/2021   HGBA1C 6.3 (A) 05/02/2021   HGBA1C 5.9 (A) 10/12/2020   Lab Results  Component Value Date   INSULIN 5.8 11/21/2021   Lab Results  Component Value Date   TSH 0.976 11/21/2021   Lab Results  Component Value Date   CHOL 137 11/21/2021   HDL 57 11/21/2021   LDLCALC 63 11/21/2021   TRIG 87 11/21/2021   CHOLHDL 2.7 05/02/2021   Lab Results  Component Value Date   VD25OH 34.2 11/21/2021   Lab Results  Component Value Date   WBC 7.3 07/06/2022   HGB 11.9 (L) 07/06/2022   HCT 39.9 07/06/2022   MCV 84.2 07/06/2022   PLT 359 07/06/2022   Lab Results  Component Value Date   IRON 24 (L) 06/25/2022   TIBC 351 06/25/2022   FERRITIN 81 06/25/2022   Attestation Statements:   Reviewed by clinician on day of visit: allergies, medications, problem list, medical history, surgical history, family history, social history, and previous encounter notes.   Wilhemena Durie, am acting as transcriptionist for [employer], DO.   I have reviewed the above documentation for accuracy and completeness, and I agree with the above. Marjory Sneddon, D.O.  The [place] was signed into law in 2016 which includes the topic of electronic health records.  This provides immediate access to information in MyChart.  This includes consultation notes, operative notes, office notes, lab results and pathology reports.  If you have any questions about  what you read please let us know at your next visit so we can discuss your concerns and take corrective action if need be.  We are right here with you.
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
## 2022-07-09 ENCOUNTER — Telehealth: Payer: Self-pay | Admitting: Nurse Practitioner

## 2022-07-09 NOTE — Telephone Encounter (Signed)
Spoke with patient confirming upcoming appointments  

## 2022-07-16 ENCOUNTER — Ambulatory Visit (INDEPENDENT_AMBULATORY_CARE_PROVIDER_SITE_OTHER): Payer: Medicaid Other | Admitting: Internal Medicine

## 2022-07-16 ENCOUNTER — Other Ambulatory Visit (HOSPITAL_COMMUNITY): Payer: Self-pay

## 2022-07-16 MED ORDER — CEFADROXIL 500 MG PO CAPS
500.0000 mg | ORAL_CAPSULE | Freq: Two times a day (BID) | ORAL | 1 refills | Status: DC
  Filled 2022-07-16: qty 20, 10d supply, fill #0

## 2022-07-17 ENCOUNTER — Other Ambulatory Visit (HOSPITAL_COMMUNITY): Payer: Self-pay

## 2022-07-17 ENCOUNTER — Ambulatory Visit (INDEPENDENT_AMBULATORY_CARE_PROVIDER_SITE_OTHER): Payer: Medicaid Other | Admitting: Internal Medicine

## 2022-07-17 ENCOUNTER — Encounter (INDEPENDENT_AMBULATORY_CARE_PROVIDER_SITE_OTHER): Payer: Self-pay | Admitting: Internal Medicine

## 2022-07-17 VITALS — BP 120/77 | HR 73 | Temp 97.9°F | Ht 63.0 in | Wt 191.0 lb

## 2022-07-17 DIAGNOSIS — Z7984 Long term (current) use of oral hypoglycemic drugs: Secondary | ICD-10-CM

## 2022-07-17 DIAGNOSIS — E559 Vitamin D deficiency, unspecified: Secondary | ICD-10-CM | POA: Diagnosis not present

## 2022-07-17 DIAGNOSIS — E119 Type 2 diabetes mellitus without complications: Secondary | ICD-10-CM

## 2022-07-17 DIAGNOSIS — Z6833 Body mass index (BMI) 33.0-33.9, adult: Secondary | ICD-10-CM | POA: Diagnosis not present

## 2022-07-17 DIAGNOSIS — E669 Obesity, unspecified: Secondary | ICD-10-CM

## 2022-07-17 DIAGNOSIS — Z7985 Long-term (current) use of injectable non-insulin antidiabetic drugs: Secondary | ICD-10-CM

## 2022-07-17 MED ORDER — VITAMIN D (ERGOCALCIFEROL) 1.25 MG (50000 UNIT) PO CAPS
50000.0000 [IU] | ORAL_CAPSULE | ORAL | 0 refills | Status: DC
  Filled 2022-07-17 – 2022-07-28 (×2): qty 12, 84d supply, fill #0

## 2022-07-18 ENCOUNTER — Inpatient Hospital Stay: Payer: Medicaid Other

## 2022-07-18 ENCOUNTER — Other Ambulatory Visit: Payer: Self-pay

## 2022-07-18 VITALS — BP 130/76 | HR 88 | Temp 98.6°F | Resp 18

## 2022-07-18 DIAGNOSIS — D649 Anemia, unspecified: Secondary | ICD-10-CM | POA: Diagnosis not present

## 2022-07-18 DIAGNOSIS — I10 Essential (primary) hypertension: Secondary | ICD-10-CM | POA: Diagnosis not present

## 2022-07-18 DIAGNOSIS — R5383 Other fatigue: Secondary | ICD-10-CM | POA: Diagnosis not present

## 2022-07-18 DIAGNOSIS — E119 Type 2 diabetes mellitus without complications: Secondary | ICD-10-CM | POA: Diagnosis not present

## 2022-07-18 MED ORDER — SODIUM CHLORIDE 0.9 % IV SOLN
510.0000 mg | Freq: Once | INTRAVENOUS | Status: AC
  Administered 2022-07-18: 510 mg via INTRAVENOUS
  Filled 2022-07-18: qty 510

## 2022-07-18 MED ORDER — CETIRIZINE HCL 10 MG/ML IV SOLN
10.0000 mg | Freq: Once | INTRAVENOUS | Status: AC
  Administered 2022-07-18: 10 mg via INTRAVENOUS
  Filled 2022-07-18: qty 1

## 2022-07-18 MED ORDER — SODIUM CHLORIDE 0.9 % IV SOLN: Freq: Once | INTRAVENOUS | Status: AC

## 2022-07-18 NOTE — Patient Instructions (Signed)

## 2022-07-25 ENCOUNTER — Other Ambulatory Visit (HOSPITAL_COMMUNITY): Payer: Self-pay

## 2022-07-28 ENCOUNTER — Other Ambulatory Visit (HOSPITAL_COMMUNITY): Payer: Self-pay

## 2022-07-28 ENCOUNTER — Other Ambulatory Visit: Payer: Self-pay | Admitting: Gastroenterology

## 2022-07-28 DIAGNOSIS — E559 Vitamin D deficiency, unspecified: Secondary | ICD-10-CM | POA: Insufficient documentation

## 2022-07-28 MED ORDER — LINACLOTIDE 145 MCG PO CAPS
145.0000 ug | ORAL_CAPSULE | Freq: Every day | ORAL | 1 refills | Status: DC
  Filled 2022-07-28: qty 30, 30d supply, fill #0
  Filled 2022-09-10: qty 30, 30d supply, fill #1

## 2022-08-03 NOTE — Progress Notes (Unsigned)
Chief Complaint:   OBESITY Meghan Wilson is here to discuss her progress with her obesity treatment plan along with follow-up of her obesity related diagnoses. Meghan Wilson is on the Category 2 Plan and states she is following her eating plan approximately 40% of the time. Meghan Wilson states she is not currently exercising.  Today's visit was #: 12 Starting weight: 195 lbs Starting date: 11/21/2021 Today's weight: 191 lbs Today's date: 07/17/2022 Total lbs lost to date: 4 Total lbs lost since last in-office visit: 0  Interim History: First encounter with patient. Pt reports a lapse due to surgery and having to take sedating pain medications. She is starting the plan again and notes improved satiety and satiation.her Ozempic was recently increased by PCP to 1 mg, and she has no side effects.  Subjective:   1. Type 2 diabetes mellitus without complication, without long-term current use of insulin (HCC) Improving. Pt has had no lows. She is on Ozempic and Metformin. Pt's last A1c was 6.0.  2. Vitamin D deficiency She is currently taking prescription vitamin D 50,000 IU each week. She denies nausea, vomiting or muscle weakness.  Assessment/Plan:   1. Type 2 diabetes mellitus without complication, without long-term current use of insulin (HCC) Continue weight loss therapy and regimen. Increase physical activity once healed from surgery.  2. Vitamin D deficiency She agrees to continue to take prescription Vitamin D 50,000 IU every week and will follow-up for routine testing of Vitamin D, at least 2-3 times per year to avoid over-replacement.  Refill- Vitamin D, Ergocalciferol, (DRISDOL) 1.25 MG (50000 UNIT) CAPS capsule; Take 1 capsule (50,000 Units total) by mouth every 7 (seven) days.  Dispense: 12 capsule; Refill: 0  3. Obesity, Current BMI of 33.9 Meghan Wilson is currently in the action stage of change. As such, her goal is to continue with weight loss efforts. She has agreed to the Category 2 Plan.    Continue Ozempic 1 mg weekly. We will assess response in 3-4 weeks. Exercise goals:  As is  Behavioral modification strategies: increasing lean protein intake, decreasing simple carbohydrates, increasing water intake, no skipping meals, keeping healthy foods in the home, better snacking choices, avoiding temptations, and planning for success.  Meghan Wilson has agreed to follow-up with our clinic in 3-4 weeks. She was informed of the importance of frequent follow-up visits to maximize her success with intensive lifestyle modifications for her multiple health conditions.   Objective:   Blood pressure 120/77, pulse 73, temperature 97.9 F (36.6 C), height '5\' 3"'$  (1.6 m), weight 191 lb (86.6 kg), SpO2 98 %. Body mass index is 33.83 kg/m.  General: Cooperative, alert, well developed, in no acute distress. HEENT: Conjunctivae and lids unremarkable. Cardiovascular: Regular rhythm.  Lungs: Normal work of breathing. Neurologic: No focal deficits.   Lab Results  Component Value Date   CREATININE 0.51 07/06/2022   BUN 9 07/06/2022   NA 141 07/06/2022   K 4.1 07/06/2022   CL 108 07/06/2022   CO2 26 07/06/2022   Lab Results  Component Value Date   ALT 24 03/18/2022   AST 13 (L) 03/18/2022   ALKPHOS 71 03/18/2022   BILITOT 0.1 (L) 03/18/2022   Lab Results  Component Value Date   HGBA1C 6.0 (A) 04/07/2022   HGBA1C 5.9 (A) 12/18/2021   HGBA1C 6.2 (A) 08/22/2021   HGBA1C 6.3 (A) 05/02/2021   HGBA1C 5.9 (A) 10/12/2020   Lab Results  Component Value Date   INSULIN 5.8 11/21/2021   Lab Results  Component Value Date   TSH 0.976 11/21/2021   Lab Results  Component Value Date   CHOL 137 11/21/2021   HDL 57 11/21/2021   LDLCALC 63 11/21/2021   TRIG 87 11/21/2021   CHOLHDL 2.7 05/02/2021   Lab Results  Component Value Date   VD25OH 34.2 11/21/2021   Lab Results  Component Value Date   WBC 7.3 07/06/2022   HGB 11.9 (L) 07/06/2022   HCT 39.9 07/06/2022   MCV 84.2 07/06/2022    PLT 359 07/06/2022   Lab Results  Component Value Date   IRON 24 (L) 06/25/2022   TIBC 351 06/25/2022   FERRITIN 81 06/25/2022    Attestation Statements:   Reviewed by clinician on day of visit: allergies, medications, problem list, medical history, surgical history, family history, social history, and previous encounter notes.  Time spent on visit including pre-visit chart review and post-visit care and charting was 20 minutes.   I, Kathlene November, BS, CMA, am acting as transcriptionist for Thomes Dinning, MD.  I have reviewed the above documentation for accuracy and completeness, and I agree with the above. -Thomes Dinning, MD

## 2022-08-07 ENCOUNTER — Other Ambulatory Visit (HOSPITAL_COMMUNITY): Payer: Self-pay

## 2022-08-07 ENCOUNTER — Ambulatory Visit (INDEPENDENT_AMBULATORY_CARE_PROVIDER_SITE_OTHER): Payer: Medicaid Other | Admitting: Internal Medicine

## 2022-08-07 ENCOUNTER — Encounter (INDEPENDENT_AMBULATORY_CARE_PROVIDER_SITE_OTHER): Payer: Self-pay | Admitting: Internal Medicine

## 2022-08-07 VITALS — BP 119/74 | HR 91 | Temp 97.5°F | Ht 63.0 in | Wt 189.0 lb

## 2022-08-07 DIAGNOSIS — Z7985 Long-term (current) use of injectable non-insulin antidiabetic drugs: Secondary | ICD-10-CM

## 2022-08-07 DIAGNOSIS — Z7984 Long term (current) use of oral hypoglycemic drugs: Secondary | ICD-10-CM

## 2022-08-07 DIAGNOSIS — E119 Type 2 diabetes mellitus without complications: Secondary | ICD-10-CM | POA: Diagnosis not present

## 2022-08-07 DIAGNOSIS — Z6833 Body mass index (BMI) 33.0-33.9, adult: Secondary | ICD-10-CM | POA: Diagnosis not present

## 2022-08-07 MED ORDER — SEMAGLUTIDE (1 MG/DOSE) 4 MG/3ML ~~LOC~~ SOPN
1.0000 mg | PEN_INJECTOR | SUBCUTANEOUS | 1 refills | Status: DC
  Filled 2022-08-07: qty 3, 28d supply, fill #0
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
## 2022-08-18 ENCOUNTER — Inpatient Hospital Stay (HOSPITAL_BASED_OUTPATIENT_CLINIC_OR_DEPARTMENT_OTHER): Payer: Medicaid Other | Admitting: Nurse Practitioner

## 2022-08-18 ENCOUNTER — Encounter: Payer: Self-pay | Admitting: Nurse Practitioner

## 2022-08-18 ENCOUNTER — Inpatient Hospital Stay: Payer: Medicaid Other | Attending: Nurse Practitioner

## 2022-08-18 VITALS — BP 140/82 | HR 87 | Temp 97.2°F | Resp 15 | Ht 63.0 in | Wt 195.6 lb

## 2022-08-18 DIAGNOSIS — R5383 Other fatigue: Secondary | ICD-10-CM | POA: Diagnosis not present

## 2022-08-18 DIAGNOSIS — I10 Essential (primary) hypertension: Secondary | ICD-10-CM | POA: Diagnosis not present

## 2022-08-18 DIAGNOSIS — Z90710 Acquired absence of both cervix and uterus: Secondary | ICD-10-CM | POA: Diagnosis not present

## 2022-08-18 DIAGNOSIS — D508 Other iron deficiency anemias: Secondary | ICD-10-CM

## 2022-08-18 DIAGNOSIS — E119 Type 2 diabetes mellitus without complications: Secondary | ICD-10-CM | POA: Insufficient documentation

## 2022-08-18 DIAGNOSIS — R519 Headache, unspecified: Secondary | ICD-10-CM | POA: Diagnosis not present

## 2022-08-18 DIAGNOSIS — Z96642 Presence of left artificial hip joint: Secondary | ICD-10-CM | POA: Insufficient documentation

## 2022-08-18 DIAGNOSIS — D509 Iron deficiency anemia, unspecified: Secondary | ICD-10-CM | POA: Diagnosis not present

## 2022-08-18 DIAGNOSIS — H9319 Tinnitus, unspecified ear: Secondary | ICD-10-CM | POA: Insufficient documentation

## 2022-08-18 LAB — CBC WITH DIFFERENTIAL (CANCER CENTER ONLY)
Abs Immature Granulocytes: 0.03 10*3/uL (ref 0.00–0.07)
Basophils Absolute: 0 10*3/uL (ref 0.0–0.1)
Basophils Relative: 1 %
Eosinophils Absolute: 0.1 10*3/uL (ref 0.0–0.5)
Eosinophils Relative: 1 %
HCT: 41.7 % (ref 36.0–46.0)
Hemoglobin: 13.3 g/dL (ref 12.0–15.0)
Immature Granulocytes: 0 %
Lymphocytes Relative: 31 %
Lymphs Abs: 2.4 10*3/uL (ref 0.7–4.0)
MCH: 25.9 pg — ABNORMAL LOW (ref 26.0–34.0)
MCHC: 31.9 g/dL (ref 30.0–36.0)
MCV: 81.3 fL (ref 80.0–100.0)
Monocytes Absolute: 0.6 10*3/uL (ref 0.1–1.0)
Monocytes Relative: 8 %
Neutro Abs: 4.5 10*3/uL (ref 1.7–7.7)
Neutrophils Relative %: 59 %
Platelet Count: 315 10*3/uL (ref 150–400)
RBC: 5.13 MIL/uL — ABNORMAL HIGH (ref 3.87–5.11)
RDW: 16.9 % — ABNORMAL HIGH (ref 11.5–15.5)
WBC Count: 7.6 10*3/uL (ref 4.0–10.5)
nRBC: 0 % (ref 0.0–0.2)

## 2022-08-18 LAB — IRON AND IRON BINDING CAPACITY (CC-WL,HP ONLY)
Iron: 81 ug/dL (ref 28–170)
Saturation Ratios: 22 % (ref 10.4–31.8)
TIBC: 363 ug/dL (ref 250–450)
UIBC: 282 ug/dL (ref 148–442)

## 2022-08-18 LAB — FERRITIN: Ferritin: 151 ng/mL (ref 11–307)

## 2022-08-21 NOTE — Progress Notes (Signed)
Chief Complaint:   OBESITY Meghan Wilson is here to discuss her progress with her obesity treatment plan along with follow-up of her obesity related diagnoses. Meghan Wilson is on the Category 2 Plan and states she is following her eating plan approximately 70% of the time. Meghan Wilson states she is not currently exercising.  Today's visit was #: 86 Starting weight: 195 lbs Starting date: 11/21/2021 Today's weight: 189 lbs Today's date: 08/07/2022 Total lbs lost to date: 6 Total lbs lost since last in-office visit: 2  Interim History: Patient feels good and energetic. She reports adequate satiety and satiation.she is on Ozempic with  no side effects. No abnormal cravings or eating patterns. Patient is making healthier choices and displaying mindfulness around treats. She has recovered from surgery and is eager to go to [employer]. BIA shows decrease in fat mass and preserved muscle mass. Overall weight loss since January is 18 lbs.  Subjective:   1. Type 2 diabetes mellitus without complication, without long-term current use of insulin (HCC) A1c 6.0. Pt denies lows. She is on Ozempic and Metformin with no side effects.  Assessment/Plan:   1. Type 2 diabetes mellitus without complication, without long-term current use of insulin (HCC) Continue weight loss therapy. Continue Metformin and Ozempic at current dose. Check A1c in January.  Refill- Semaglutide, 1 MG/DOSE, 4 MG/3ML SOPN; Inject 1 mg into the skin once a week.  Dispense: 3 mL; Refill: 1  2. Obesity, Current BMI of 33.6 Refill- Semaglutide, 1 MG/DOSE, 4 MG/3ML SOPN; Inject 1 mg into the skin once a week.  Dispense: 3 mL; Refill: 1  Meghan Wilson is currently in the action stage of change. As such, her goal is to continue with weight loss efforts. She has agreed to the Category 2 Plan.   Exercise goals:  Pt will start [employer].  Behavioral modification strategies: increasing lean protein intake, increasing water intake, meal planning and  cooking strategies, holiday eating strategies , avoiding temptations, and planning for success.  Meghan Wilson has agreed to follow-up with our clinic in 3 weeks. She was informed of the importance of frequent follow-up visits to maximize her success with intensive lifestyle modifications for her multiple health conditions.   Objective:   Blood pressure 119/74, pulse 91, temperature (!) 97.5 F (36.4 C), height '5\' 3"'$  (1.6 m), weight 189 lb (85.7 kg), SpO2 99 %. Body mass index is 33.48 kg/m.  General: Cooperative, alert, well developed, in no acute distress. HEENT: Conjunctivae and lids unremarkable. Cardiovascular: Regular rhythm.  Lungs: Normal work of breathing. Neurologic: No focal deficits.   Lab Results  Component Value Date   CREATININE 0.51 07/06/2022   BUN 9 07/06/2022   NA 141 07/06/2022   K 4.1 07/06/2022   CL 108 07/06/2022   CO2 26 07/06/2022   Lab Results  Component Value Date   ALT 24 03/18/2022   AST 13 (L) 03/18/2022   ALKPHOS 71 03/18/2022   BILITOT 0.1 (L) 03/18/2022   Lab Results  Component Value Date   HGBA1C 6.0 (A) 04/07/2022   HGBA1C 5.9 (A) 12/18/2021   HGBA1C 6.2 (A) 08/22/2021   HGBA1C 6.3 (A) 05/02/2021   HGBA1C 5.9 (A) 10/12/2020   Lab Results  Component Value Date   INSULIN 5.8 11/21/2021   Lab Results  Component Value Date   TSH 0.976 11/21/2021   Lab Results  Component Value Date   CHOL 137 11/21/2021   HDL 57 11/21/2021   LDLCALC 63 11/21/2021   TRIG 87 11/21/2021  CHOLHDL 2.7 05/02/2021   Lab Results  Component Value Date   VD25OH 34.2 11/21/2021   Lab Results  Component Value Date   WBC 7.6 08/18/2022   HGB 13.3 08/18/2022   HCT 41.7 08/18/2022   MCV 81.3 08/18/2022   PLT 315 08/18/2022   Lab Results  Component Value Date   IRON 81 08/18/2022   TIBC 363 08/18/2022   FERRITIN 151 08/18/2022   Attestation Statements:   Reviewed by clinician on day of visit: allergies, medications, problem list, medical history,  surgical history, family history, social history, and previous encounter notes.  Time spent on visit including pre-visit chart review and post-visit care and charting was 20 minutes.   I, Kathlene November, BS, CMA, am acting as transcriptionist for Thomes Dinning, MD.  I have reviewed the above documentation for accuracy and completeness, and I agree with the above. -Thomes Dinning, MD

## 2022-08-26 ENCOUNTER — Ambulatory Visit (INDEPENDENT_AMBULATORY_CARE_PROVIDER_SITE_OTHER): Payer: Medicaid Other | Admitting: Internal Medicine

## 2022-08-28 ENCOUNTER — Ambulatory Visit
Admission: RE | Admit: 2022-08-28 | Discharge: 2022-08-28 | Disposition: A | Discharge status: Home / Self Care | Payer: Medicaid Other | Source: Ambulatory Visit | Attending: Internal Medicine | Admitting: Internal Medicine

## 2022-08-28 DIAGNOSIS — Z1231 Encounter for screening mammogram for malignant neoplasm of breast: Secondary | ICD-10-CM

## 2022-09-03 ENCOUNTER — Other Ambulatory Visit (HOSPITAL_COMMUNITY): Payer: Self-pay

## 2022-09-04 ENCOUNTER — Other Ambulatory Visit (HOSPITAL_COMMUNITY): Payer: Self-pay

## 2022-09-04 ENCOUNTER — Encounter (INDEPENDENT_AMBULATORY_CARE_PROVIDER_SITE_OTHER): Payer: Self-pay | Admitting: Internal Medicine

## 2022-09-04 ENCOUNTER — Ambulatory Visit (INDEPENDENT_AMBULATORY_CARE_PROVIDER_SITE_OTHER): Payer: Medicaid Other | Admitting: Internal Medicine

## 2022-09-04 DIAGNOSIS — E669 Obesity, unspecified: Secondary | ICD-10-CM

## 2022-09-04 DIAGNOSIS — Z6834 Body mass index (BMI) 34.0-34.9, adult: Secondary | ICD-10-CM | POA: Diagnosis not present

## 2022-09-04 DIAGNOSIS — Z7985 Long-term (current) use of injectable non-insulin antidiabetic drugs: Secondary | ICD-10-CM

## 2022-09-04 DIAGNOSIS — Z7984 Long term (current) use of oral hypoglycemic drugs: Secondary | ICD-10-CM

## 2022-09-04 DIAGNOSIS — E119 Type 2 diabetes mellitus without complications: Secondary | ICD-10-CM

## 2022-09-04 MED ORDER — SEMAGLUTIDE (2 MG/DOSE) 8 MG/3ML ~~LOC~~ SOPN
2.0000 mg | PEN_INJECTOR | SUBCUTANEOUS | 0 refills | Status: DC
  Filled 2022-09-04: qty 3, 28d supply, fill #0

## 2022-09-04 NOTE — Progress Notes (Signed)
Chief Complaint:   OBESITY Meghan Wilson is here to discuss her progress with her obesity treatment plan along with follow-up of her obesity related diagnoses. Meghan Wilson is on the Category 2 Plan and states she is following her eating plan approximately 75% of the time. Meghan Wilson states she is doing cardio for 45 minutes 2-3 times per week.  Today's visit was #: 14 Starting weight: 195 lbs Starting date: 11/21/2021 Today's weight: 192 lbs Today's date: 09/04/2022 Total lbs lost to date: 3 Total lbs lost since last in-office visit: 0  Interim History: Meghan Wilson presents today for weight management.  Since last office visit she has gained 3 pounds.  She acknowledges deviating from prescribed nutritional plan during the holidays but has started back again.  She reports adequate satiety but notes increased hunger signals and cravings at times.  She denies consumption of liquid calories or problems with portion control.  She is on Ozempic for diabetes and denies any adverse effects.  She is increasing physical activity levels and will be starting to go to [employer].  Subjective:   1. Type 2 diabetes mellitus without complication, without long-term current use of insulin (HCC) Stable.  Patient denies any hypoglycemia or hyperglycemia.  She denies any side effects currently on metformin and semaglutide.  Assessment/Plan:   1. Type 2 diabetes mellitus without complication, without long-term current use of insulin (HCC) Weight loss therapy, increase semaglutide to 2 mg a week.  A refill has been sent to the pharmacy.  We are also checking A1c lipid panel and urine microalbumin for disease monitoring.  - Lipid Panel With LDL/HDL Ratio - Microalbumin / creatinine urine ratio - Comprehensive metabolic panel - Hemoglobin A1c - Semaglutide, 2 MG/DOSE, 8 MG/3ML SOPN; Inject 2 mg as directed once a week.  Dispense: 3 mL; Refill: 0  2. Obesity, Current BMI of 34 In addition to nutritional plan, physical  activity and behavioral counseling we will increase her Ozempic to 2 mg a day for the management of her diabetes and assistance with weight management.  Patient counseled on mechanism of action and medication side effects.  - VITAMIN D 25 Hydroxy (Vit-D Deficiency, Fractures)  Meghan Wilson is currently in the action stage of change. As such, her goal is to continue with weight loss efforts. She has agreed to the Category 2 Plan.   Exercise goals: As is.   Behavioral modification strategies: increasing lean protein intake, increasing water intake, no skipping meals, meal planning and cooking strategies, avoiding temptations, and planning for success.  Meghan Wilson has agreed to follow-up with our clinic in 3 weeks. She was informed of the importance of frequent follow-up visits to maximize her success with intensive lifestyle modifications for her multiple health conditions.   Meghan Wilson was informed we would discuss her lab results at her next visit unless there is a critical issue that needs to be addressed sooner. Meghan Wilson agreed to keep her next visit at the agreed upon time to discuss these results.  Objective:   Blood pressure 136/86, pulse 90, temperature 97.9 F (36.6 C), height '5\' 3"'$  (1.6 m), weight 192 lb (87.1 kg), SpO2 100 %. Body mass index is 34.01 kg/m.  General: Cooperative, alert, well developed, in no acute distress. HEENT: Conjunctivae and lids unremarkable. Cardiovascular: Regular rhythm.  Lungs: Normal work of breathing. Neurologic: No focal deficits.   Lab Results  Component Value Date   CREATININE 0.53 (L) 09/04/2022   BUN 7 (L) 09/04/2022   NA 141 09/04/2022  K 4.3 09/04/2022   CL 104 09/04/2022   CO2 24 09/04/2022   Lab Results  Component Value Date   ALT 19 09/04/2022   AST 19 09/04/2022   ALKPHOS 119 09/04/2022   BILITOT <0.2 09/04/2022   Lab Results  Component Value Date   HGBA1C 6.4 (H) 09/04/2022   HGBA1C 6.0 (A) 04/07/2022   HGBA1C 5.9 (A) 12/18/2021    HGBA1C 6.2 (A) 08/22/2021   HGBA1C 6.3 (A) 05/02/2021   Lab Results  Component Value Date   INSULIN 5.8 11/21/2021   Lab Results  Component Value Date   TSH 0.976 11/21/2021   Lab Results  Component Value Date   CHOL 144 09/04/2022   HDL 70 09/04/2022   LDLCALC 63 09/04/2022   TRIG 52 09/04/2022   CHOLHDL 2.7 05/02/2021   Lab Results  Component Value Date   VD25OH 54.3 09/04/2022   VD25OH 34.2 11/21/2021   Lab Results  Component Value Date   WBC 7.6 08/18/2022   HGB 13.3 08/18/2022   HCT 41.7 08/18/2022   MCV 81.3 08/18/2022   PLT 315 08/18/2022   Lab Results  Component Value Date   IRON 81 08/18/2022   TIBC 363 08/18/2022   FERRITIN 151 08/18/2022   Attestation Statements:   Reviewed by clinician on day of visit: allergies, medications, problem list, medical history, surgical history, family history, social history, and previous encounter notes.   Wilhemena Durie, am acting as transcriptionist for Thomes Dinning, MD.  I have reviewed the above documentation for accuracy and completeness, and I agree with the above. -Thomes Dinning, MD

## 2022-09-05 LAB — COMPREHENSIVE METABOLIC PANEL
ALT: 19 IU/L (ref 0–32)
AST: 19 IU/L (ref 0–40)
Albumin/Globulin Ratio: 1.9 (ref 1.2–2.2)
Albumin: 4.3 g/dL (ref 3.9–4.9)
Alkaline Phosphatase: 119 IU/L (ref 44–121)
BUN/Creatinine Ratio: 13 (ref 12–28)
BUN: 7 mg/dL — ABNORMAL LOW (ref 8–27)
Bilirubin Total: <0.2 mg/dL (ref 0.0–1.2)
CO2: 24 mmol/L (ref 20–29)
Calcium: 9.3 mg/dL (ref 8.7–10.3)
Chloride: 104 mmol/L (ref 96–106)
Creatinine, Ser: 0.53 mg/dL — ABNORMAL LOW (ref 0.57–1.00)
Globulin, Total: 2.3 g/dL (ref 1.5–4.5)
Glucose: 93 mg/dL (ref 70–99)
Potassium: 4.3 mmol/L (ref 3.5–5.2)
Sodium: 141 mmol/L (ref 134–144)
Total Protein: 6.6 g/dL (ref 6.0–8.5)
eGFR: 104 mL/min/{1.73_m2} (ref >59)

## 2022-09-05 LAB — MICROALBUMIN / CREATININE URINE RATIO
Creatinine, Urine: 56 mg/dL
Microalb/Creat Ratio: <5 mg/g creat (ref 0–29)
Microalbumin, Urine: <3 ug/mL

## 2022-09-05 LAB — HEMOGLOBIN A1C
Est. average glucose Bld gHb Est-mCnc: 137 mg/dL
Hgb A1c MFr Bld: 6.4 % — ABNORMAL HIGH (ref 4.8–5.6)

## 2022-09-05 LAB — LIPID PANEL WITH LDL/HDL RATIO
Cholesterol, Total: 144 mg/dL (ref 100–199)
HDL: 70 mg/dL (ref >39)
LDL Chol Calc (NIH): 63 mg/dL (ref 0–99)
LDL/HDL Ratio: 0.9 ratio (ref 0.0–3.2)
Triglycerides: 52 mg/dL (ref 0–149)
VLDL Cholesterol Cal: 11 mg/dL (ref 5–40)

## 2022-09-05 LAB — VITAMIN D 25 HYDROXY (VIT D DEFICIENCY, FRACTURES): Vit D, 25-Hydroxy: 54.3 ng/mL (ref 30.0–100.0)

## 2022-09-10 ENCOUNTER — Other Ambulatory Visit: Payer: Self-pay

## 2022-09-20 NOTE — Progress Notes (Signed)
CC: Routine Follow-up  HPI:   Meghan Wilson is a 62 y.o. female with a past medical history of diabetes, hyperlipidemia, bipolar depression, prediabetes, vitamin deficiencies, and anemia who presents for routine follow-up. She was last seen at [facility] in 06-2022.   Past Medical History:  Diagnosis Date   Anemia    Anxiety    Arthritis    Back pain    Bipolar depression ([place]) 09/2015   Constipation    Depression 06/27/2021   Dizziness    History of colon polyps    History of kidney stones    History of patellar fracture 2016   History of tibial fracture 2016   Hyperlipidemia    IBS (irritable bowel syndrome)    Joint pain    Night terrors    Night terrors, adult 09/2015   Osteoarthritis    Pain of right knee after injury 03/10/2018   Post concussion syndrome 12/20/2019   Pre-diabetes    Seasonal allergies 11/09/2020   Sleep apnea    uses cpap   Vitamin B 12 deficiency    Vitamin D deficiency      Review of Systems:    Reports tinnitus, dizziness, headaches Denies fever, lightheadedness, breath shortness, exertional dyspnea, palpitations   Physical Exam:  Vitals:   09/25/22 0957  BP: 129/75  Pulse: 88  Temp: 98 F (36.7 C)  SpO2: 98%  Weight: 192 lb 9.6 oz (87.4 kg)  Height: '5\' 3"'$  (1.6 m)    General:   awake and alert, sitting comfortably in chair, cooperative, not in acute distress Skin:   warm and dry, intact without any obvious lesions or scars, no rashes or lesions  Head:   normocephalic and atraumatic, oral mucosa moist Eyes:   extraocular movements intact Lungs:   normal respiratory effort, breathing unlabored, symmetrical chest rise, no crackles or wheezing Cardiac:   regular rate and rhythm, normal S1 and S2 Neurologic:   oriented to person-place-time, moving all extremities, no gross focal deficits  Psychiatric:   tired mood with congruent affect, intelligible speech    Assessment & Plan:   Type II diabetes mellitus (Meghan Wilson) Patient has  history of diabetes, diagnosed in 2019 with an A1c of 6.5.  Since 2020, her A1c values have been in the prediabetic range, most recently 6.4 on 09-04-2022.  Throughout this time period, the patient reports maintaining several dietary adjustments including avoidance of fried foods and high-sugar goods.  In addition, she currently takes semaglutide 2 mg and metformin 500 mg.  She has been tolerating these medications well and denies nausea, diarrhea, constipation, and any abdominal pain.   - Continue metformin '500mg'$  q24 - Continue semaglutide '2mg'$  [phone]    Anemia Patient has a history of iron deficiency anemia, has been treated with iron supplementation. Most recent laboratory testing in 08-2022 confirmed normalization of ferritin, saturation, and TIBC levels. Hemoglobin at that time was 13.3. Patient denies lightheadedness, breath shortness, exertional dyspnea, and palpitations. Since starting the iron supplements, she has developed constipation that is currently managed with linaclotide and polyethylene glycol.  - Switch ferrous sulfate '325mg'$  from q24 to q48 - Recommend taking ferrous sulfate with ascorbic acid while avoiding calcium     Tinnitus, right Patient reports constant ear ringing that started around August or September 2023.  As result, she has been unable to sleep and has been experiencing near constant fatigue throughout the last several months.  Also reports dizziness, and unbalanced sensation, headaches. Failure to identify an underlying cause to has  led to a significant amount of frustration for her. She is currently following with ENT Dr Caprice Kluver, who prescribed her a vitamin B complex to pill to take which she has been taking for the last several months.  An MRI brain was also performed on January 22, which was unremarkable.  According to the patient, her ENT provider is considering surgery to treat her symptoms.  - Continue to follow with ENT Dr Caprice Kluver - Follow up with  neurology on 3-18    Healthcare maintenance Patient has already received the influenza vaccine this season, fall 2023.  - Encourage influenza vaccination in fall 2024      See Encounters Tab for problem based charting.  Patient discussed with Dr.  Cain Sieve

## 2022-09-25 ENCOUNTER — Other Ambulatory Visit: Payer: Self-pay

## 2022-09-25 ENCOUNTER — Encounter: Payer: Medicaid Other | Admitting: Internal Medicine

## 2022-09-25 ENCOUNTER — Ambulatory Visit (INDEPENDENT_AMBULATORY_CARE_PROVIDER_SITE_OTHER): Payer: Medicaid Other | Admitting: Internal Medicine

## 2022-09-25 ENCOUNTER — Encounter: Payer: Self-pay | Admitting: Student

## 2022-09-25 ENCOUNTER — Ambulatory Visit: Payer: Medicaid Other | Admitting: Student

## 2022-09-25 VITALS — BP 129/75 | HR 88 | Temp 98.0°F | Ht 63.0 in | Wt 192.6 lb

## 2022-09-25 DIAGNOSIS — H9311 Tinnitus, right ear: Secondary | ICD-10-CM

## 2022-09-25 DIAGNOSIS — D649 Anemia, unspecified: Secondary | ICD-10-CM

## 2022-09-25 DIAGNOSIS — Z7984 Long term (current) use of oral hypoglycemic drugs: Secondary | ICD-10-CM | POA: Diagnosis not present

## 2022-09-25 DIAGNOSIS — E119 Type 2 diabetes mellitus without complications: Secondary | ICD-10-CM | POA: Diagnosis not present

## 2022-09-25 DIAGNOSIS — Z0000 Encounter for general adult medical examination without abnormal findings: Secondary | ICD-10-CM

## 2022-09-25 NOTE — Assessment & Plan Note (Signed)
Patient has history of diabetes, diagnosed in 2019 with an A1c of 6.5.  Since 2020, her A1c values have been in the prediabetic range, most recently 6.4 on 09-04-2022.  Throughout this time period, the patient reports maintaining several dietary adjustments including avoidance of fried foods and high-sugar goods.  In addition, she currently takes semaglutide 2 mg and metformin 500 mg.  She has been tolerating these medications well and denies nausea, diarrhea, constipation, and any abdominal pain.   - Continue metformin '500mg'$  q24 - Continue semaglutide '2mg'$  [phone]

## 2022-09-25 NOTE — Patient Instructions (Signed)
  Thank you, Ms.Nike Burdo, for allowing [place] to provide your care today. Today we discussed . . .  > Ear Ringing       - please continue to follow with your ENT Dr Dimas Millin       - you also have an upcoming visit with Neurology in March > Diabetes       - keep up the great work with your dietary changes       - continue to take your metformin and semaglutide regularly > Anemia       - please consider taking your iron supplements every other day       - take them with vitamin C and avoid calcium to help absorption   I have ordered the following labs for you:  Lab Orders  No laboratory test(s) ordered today      Tests ordered today:  none   Referrals ordered today:   Referral Orders  No referral(s) requested today      I have ordered the following medication/changed the following medications:   Stop the following medications: There are no discontinued medications.   Start the following medications: No orders of the defined types were placed in this encounter.     Follow up: 3 months    Remember:  Start taking your iron every other day instead of every day. Take it with vitamin C and without calcium. Please continue to see ENT Dr Dimas Millin. We will see you again in about three months!   Should you have any questions or concerns please call the internal medicine clinic at [phone].     Roswell Nickel, MD [place]

## 2022-09-25 NOTE — Assessment & Plan Note (Addendum)
Patient has already received the influenza vaccine this season, fall 2023.  - Encourage influenza vaccination in fall 2024

## 2022-09-25 NOTE — Assessment & Plan Note (Signed)
Patient reports constant ear ringing that started around August or September 2023.  As result, she has been unable to sleep and has been experiencing near constant fatigue throughout the last several months.  Also reports dizziness, and unbalanced sensation, headaches. Failure to identify an underlying cause to has led to a significant amount of frustration for her. She is currently following with ENT Dr Caprice Kluver, who prescribed her a vitamin B complex to pill to take which she has been taking for the last several months.  An MRI brain was also performed on January 22, which was unremarkable.  According to the patient, her ENT provider is considering surgery to treat her symptoms.  - Continue to follow with ENT Dr Caprice Kluver - Follow up with neurology on 3-18

## 2022-09-25 NOTE — Assessment & Plan Note (Signed)
Patient has a history of iron deficiency anemia, has been treated with iron supplementation. Most recent laboratory testing in 08-2022 confirmed normalization of ferritin, saturation, and TIBC levels. Hemoglobin at that time was 13.3. Patient denies lightheadedness, breath shortness, exertional dyspnea, and palpitations. Since starting the iron supplements, she has developed constipation that is currently managed with linaclotide and polyethylene glycol.  - Switch ferrous sulfate '325mg'$  from q24 to q48 - Recommend taking ferrous sulfate with ascorbic acid while avoiding calcium

## 2022-09-26 NOTE — Progress Notes (Signed)
Internal Medicine Clinic Attending  Case discussed with Dr.  Jodi Mourning   At the time of the visit.  We reviewed the resident's history and exam and pertinent patient test results.  I agree with the assessment, diagnosis, and plan of care documented in the resident's note.    No major changes made today. I agree that patient's constipation may be related to iron, so we will decrease frequency of iron dosing to see if we can limit polypharmacy (taking miralax, linzess currently).

## 2022-09-29 ENCOUNTER — Ambulatory Visit: Payer: Medicaid Other | Admitting: Gastroenterology

## 2022-09-29 ENCOUNTER — Other Ambulatory Visit (HOSPITAL_COMMUNITY): Payer: Self-pay

## 2022-09-29 ENCOUNTER — Encounter: Payer: Self-pay | Admitting: Gastroenterology

## 2022-09-29 VITALS — BP 124/80 | HR 91 | Ht 63.0 in | Wt 193.0 lb

## 2022-09-29 DIAGNOSIS — K5909 Other constipation: Secondary | ICD-10-CM | POA: Diagnosis not present

## 2022-09-29 DIAGNOSIS — D649 Anemia, unspecified: Secondary | ICD-10-CM | POA: Diagnosis not present

## 2022-09-29 MED ORDER — LINACLOTIDE 145 MCG PO CAPS
145.0000 ug | ORAL_CAPSULE | Freq: Every day | ORAL | 11 refills | Status: DC
  Filled 2022-09-29 – 2022-10-15 (×2): qty 30, 30d supply, fill #0
  Filled 2022-11-13: qty 30, 30d supply, fill #1
  Filled 2022-12-23: qty 30, 30d supply, fill #2
  Filled 2023-01-30: qty 30, 30d supply, fill #3
  Filled 2023-03-17 (×2): qty 30, 30d supply, fill #4
  Filled 2023-04-27: qty 30, 30d supply, fill #5
  Filled 2023-05-22 (×2): qty 30, 30d supply, fill #6
  Filled 2023-07-22: qty 30, 30d supply, fill #7
  Filled 2023-09-07: qty 30, 30d supply, fill #8

## 2022-09-29 NOTE — Progress Notes (Signed)
HPI :  62 year old female here for a follow-up visit for bowel issues, anemia.  She was last seen in November 2022 for constipation, abdominal pain, GERD, also had a history of H. pylori.  She has had that treated in the past with eradication.  Recall she had problems with bloating and constipation in the past.  She had strong related symptoms to beef and pork in the past, did serologic workup for alpha gal which was negative.  Had placed her on a bowel regimen with Linzess and she states that has been working fairly well for her.  She is pretty happy with the regimen of Linzess currently and wants to continue with that.  One of the main issues that bothers her is the sense of incomplete evacuation when she uses her bowels.  She has to strain a lot and place a finger outside of her rectal area to push into her rectum from the outside as she feels that that will help her evacuate.  Denies a clear bulge in that area or prolapse of her rectum or vagina.  She inquires about what she can do for this.  She reminds me that she had hip replacement this past September 2023.  During that time her hemoglobin dropped to 8s as it has in the past with her orthopedic surgeries (she also had 2 additional knee surgeries earlier in the year).  She had some blood work done following that hospitalization and noted to have a mild iron deficiency.  She was placed on oral iron and her anemia and iron studies have completely resolved as of December.  She denies any overt blood in her stool at all.  Her stools are a bit dark from iron use.  Recall she had an EGD and colonoscopy in May 2022, treated for H. pylori, no high risk lesions.  She is status post hysterectomy.  She was seen by hematology, they felt iron deficiency likely due to repeated orthopedic surgeries with blood loss.  Of note she has been taking meloxicam daily for joint pains and not on any GI prophylaxis.  She states she is recovering from those operations and now  more active and able to exercise more.  Recall she has had numerous CT scans in the past few years for evaluation of some of her abdominal symptoms.  Last CT scan July 2023 which was negative.     EGD 01/11/21 -  - The exam of the esophagus was otherwise normal. - The entire examined stomach was normal. Biopsies were taken with a cold forceps for Helicobacter pylori testing. - Diffuse nodular mucosa was found in the duodenal bulb. Biopsies were taken with a cold forceps for histology. - The exam of the duodenum was otherwise normal.   Colonoscopy 01/11/21 - The perianal and digital rectal examinations were normal. - The terminal ileum appeared normal. - Many medium-mouthed diverticula were found in the entire colon (highest burden in ascending colon). - A diminutive polyp was found in the recto-sigmoid colon. The polyp was sessile. The polyp was removed with a cold snare. Resection and retrieval were complete. - A 3 mm polyp was found in the rectum. The polyp was sessile. The polyp was removed with a cold snare. Resection and retrieval were complete. - Internal hemorrhoids were found during retroflexion. - The exam was otherwise without abnormality.   1. Surgical [P], duodenal nodules - PEPTIC DUODENITIS. - BRUNNER GLAND HYPERPLASIA. 2. Surgical [P], gastric antrum and gastric body - CHRONIC ACTIVE GASTRITIS WITH  HELICOBACTER PYLORI [WARTHIN-STARRY STAIN PERFORMED]. 3. Surgical [P], colon, rectum, recto-sigmoid, polyp (2) - TUBULAR ADENOMA, NEGATIVE FOR HIGH GRADE DYSPLASIA (X1). - HYPERPLASTIC POLYP (X1).     She was treated for H. pylori with 14 days worth of Amoxicillin 1gm BID, Clarithromycin '500mg'$  BID Flagyl '500mg'$  BID, too her protonix '40mg'$  BID for 14 days.  She states she tolerated this but it was very difficult on her stomach but she did complete the full therapy.  She had a H pylori stool antigen 03/22/21 - negative.     Prior work-up: CT abdomen / pelvis 11/16/20 -  IMPRESSION: 1. No acute findings in the abdomen or pelvis. Specifically, no findings to explain the patient's history of left lower quadrant pain with nausea and vomiting. 2. Diffuse colonic diverticulosis without diverticulitis. 3. Aortic Atherosclerosis (ICD10-I70.0).   CT renal stone study 04/29/21 IMPRESSION: Extensive colonic diverticulosis without evidence of diverticulitis. No acute intra-abdominal or intrapelvic abnormalities. Aortic Atherosclerosis (ICD10-I70.0).   CT abdomen / pelvis with contrast 05/26/21: IMPRESSION: 1. No suspicious CT etiology for LEFT flank pain identified. 2. Moderate to large colonic stool burden.  CT scan abdomen / pelvis 03/18/22: IMPRESSION: No acute abnormality. Colonic diverticulosis.   Past Medical History:  Diagnosis Date   Anemia    Anxiety    Arthritis    Back pain    Bipolar depression ([place]) 09/2015   Constipation    Depression 06/27/2021   Dizziness    History of colon polyps    History of kidney stones    History of patellar fracture 2016   History of tibial fracture 2016   Hyperlipidemia    IBS (irritable bowel syndrome)    Joint pain    Night terrors    Night terrors, adult 09/2015   Osteoarthritis    Pain of right knee after injury 03/10/2018   Post concussion syndrome 12/20/2019   Pre-diabetes    Seasonal allergies 11/09/2020   Sleep apnea    uses cpap   Vitamin B 12 deficiency    Vitamin D deficiency      Past Surgical History:  Procedure Laterality Date   ABDOMINAL HYSTERECTOMY     CHOLECYSTECTOMY     CLOSED REDUCTION TIBIAL FRACTURE Right 2003   COLONOSCOPY  2016   [place] Right 09/17/2021   Procedure: Removal of hardware from right tibia;  Surgeon: Paralee Cancel, MD;  Location: [facility];  Service: Orthopedics;  Laterality: Right;   MANDIBLE SURGERY  2022   TOTAL HIP ARTHROPLASTY Left 05/13/2022   Procedure: TOTAL HIP ARTHROPLASTY ANTERIOR APPROACH;  Surgeon: Paralee Cancel, MD;   Location: [facility];  Service: Orthopedics;  Laterality: Left;   TOTAL KNEE ARTHROPLASTY Right 10/17/2021   Procedure: TOTAL KNEE ARTHROPLASTY;  Surgeon: Paralee Cancel, MD;  Location: [facility];  Service: Orthopedics;  Laterality: Right;   WISDOM TOOTH EXTRACTION     Family History  Problem Relation Age of Onset   Hyperlipidemia Mother    Hypertension Mother    Colon polyps Mother    Heart disease Mother    Diabetes Mother    Depression Mother    Anxiety disorder Mother    Bipolar disorder Mother    Eating disorder Mother    Obesity Mother    Arthritis/Rheumatoid Father    Hyperlipidemia Maternal Aunt    Hypertension Maternal Aunt    Cancer Maternal Aunt        "blood cancer"   Colon polyps Maternal Grandmother    Stroke  Other    Colon cancer Neg Hx    Esophageal cancer Neg Hx    Stomach cancer Neg Hx    Rectal cancer Neg Hx    Sleep apnea Neg Hx    Social History   Tobacco Use   Smoking status: Former    Packs/day: 0.25    Years: 7.00    Total pack years: 1.75    Types: Cigarettes    Quit date: 12/21/2017    Years since quitting: 4.7   Smokeless tobacco: Never  Vaping Use   Vaping Use: Never used  Substance Use Topics   Alcohol use: Yes    Comment: special occasions   Drug use: No   Current Outpatient Medications  Medication Sig Dispense Refill   Accu-Chek FastClix Lancets MISC use 1 strip to check blood sugar up to 7 times a week 102 each 3   atorvastatin (LIPITOR) 80 MG tablet Take 1 tablet (80 mg total) by mouth daily. 30 tablet 11   cycloSPORINE (RESTASIS) 0.05 % ophthalmic emulsion Instill 1 drop into both eyes twice a day 180 each 3   doxepin (SINEQUAN) 10 MG capsule Take 1 capsule (10 mg total) by mouth at bedtime as needed for sleep 30 capsule 3   ferrous sulfate 325 (65 FE) MG tablet Take 325 mg by mouth daily with breakfast.     glucose blood (ACCU-CHEK GUIDE) test strip USE TO CHECK BLOOD GLUCOSE UP TO 7 TIMES A WEEK. 100 strip 3   Glycerin-Hypromellose-PEG  400 (DRY EYE RELIEF DROPS) 0.2-0.2-1 % SOLN Place 1 drop into both eyes in the morning and at bedtime.     lamoTRIgine (LAMICTAL) 100 MG tablet Take 1 tablet (100 mg total) by mouth daily. 90 tablet 1   meloxicam (MOBIC) 15 MG tablet Take 1 tablet (15 mg total) by mouth daily. 30 tablet 2   metFORMIN (GLUCOPHAGE-XR) 500 MG 24 hr tablet Take 1 tablet (500 mg total) by mouth daily with breakfast. 90 tablet 3   ondansetron (ZOFRAN) 4 MG tablet Take 1 tablet (4 mg total) by mouth every 6 (six) hours as needed for nausea. 20 tablet 0   polyethylene glycol (MIRALAX / GLYCOLAX) 17 g packet Mix 1 packet (17 g total) in 8 oz clear liquid and drink by mouth daily as needed for mild constipation. 14 each 0   prazosin (MINIPRESS) 1 MG capsule Take 2 capsules (2 mg total) by mouth at bedtime. 60 capsule 3   prazosin (MINIPRESS) 1 MG capsule Take 3 capsules (3 mg total) by mouth at bedtime. 90 capsule 2   pregabalin (LYRICA) 75 MG capsule Take 1 capsule (75 mg total) by mouth at bedtime as needed. (Patient taking differently: Take 75 mg by mouth at bedtime.) 30 capsule 5   Semaglutide, 2 MG/DOSE, 8 MG/3ML SOPN Inject 2 mg as directed once a week. 3 mL 0   triamcinolone cream (KENALOG) 0.1 % Apply 1 application topically daily. Apply underneath breast as needed 45 g 3   Vitamin D, Ergocalciferol, (DRISDOL) 1.25 MG (50000 UNIT) CAPS capsule Take 1 capsule (50,000 Units total) by mouth every 7 (seven) days. 12 capsule 0   cefadroxil (DURICEF) 500 MG capsule Take 1 capsule (500 mg total) by mouth 2 (two) times daily for 10 days (Patient not taking: Reported on 09/29/2022) 20 capsule 1   fluticasone (FLONASE) 50 MCG/ACT nasal spray Place 1 spray into both nostrils daily as needed for allergies or rhinitis 16 g 2   linaclotide (LINZESS) 145  MCG CAPS capsule Take 1 capsule (145 mcg total) by mouth daily before breakfast. 30 capsule 11   No current facility-administered medications for this visit.   Allergies  Allergen  Reactions   Aspirin     Upsets stomach in large doses   Depakote [Divalproex Sodium] Other (See Comments)    Hair fell out   Ibuprofen Nausea And Vomiting   Prednisone Other (See Comments)    Constipation, nervousness All steroids   Latex Rash     Review of Systems: All systems reviewed and negative except where noted in HPI.   Lab Results  Component Value Date   WBC 7.6 08/18/2022   HGB 13.3 08/18/2022   HCT 41.7 08/18/2022   MCV 81.3 08/18/2022   PLT 315 08/18/2022    Lab Results  Component Value Date   CREATININE 0.53 (L) 09/04/2022   BUN 7 (L) 09/04/2022   NA 141 09/04/2022   K 4.3 09/04/2022   CL 104 09/04/2022   CO2 24 09/04/2022    Lab Results  Component Value Date   ALT 19 09/04/2022   AST 19 09/04/2022   ALKPHOS 119 09/04/2022   BILITOT <0.2 09/04/2022   Lab Results  Component Value Date   IRON 81 08/18/2022   TIBC 363 08/18/2022   FERRITIN 151 08/18/2022     Physical Exam: BP 124/80   Pulse 91   Ht '5\' 3"'$  (1.6 m)   Wt 193 lb (87.5 kg)   BMI 34.19 kg/m  Constitutional: Pleasant,well-developed, female in no acute distress. DRE - normal sensation, no mass lesions, no obvious rectocele or prolapse, normal resting tone and squeeze but push test suggestive of dyssynergia. CMA Tia Alert as standby Neurological: Alert and oriented to person place and time. Skin: Skin is warm and dry. No rashes noted. Psychiatric: Normal mood and affect. Behavior is normal.   ASSESSMENT: 62 y.o. female here for assessment of the following  1. Chronic constipation   2. Anemia, unspecified type    Generally doing much better on Linzess, bowels are more regular and pain improved although she does have some straining at times and needs to use manual maneuvers to help push out stool.  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  We discussed what this is.  I am going to refer her to  pelvic floor PT for their evaluation, treatment for possible dyssynergia initially and see if that helps.  She will continue Linzess at this time, refilled it.  We discussed her iron deficiency in the past.  Agree with her endoscopic evaluation recently, no overt GI blood loss, this could be due to her orthopedic surgeries.  However also in the setting of routine use of meloxicam at risk for gastritis or PUD.  Recommend she stop using NSAIDs routinely and use Tylenol for joint pains as preferred modality.  She agreed.  Unclear if she really needs long-term iron at this point given her CBC and iron studies have resolved.  I think reasonable to stop it and repeat her blood work in 3 months to assess for any recurrent iron deficiency.  If she does have recurrent iron deficiency off NSAIDs and no further orthopedic issues, may need to consider further endoscopic evaluation.  She agrees.   PLAN: - continue Linzess - refilled  - referral to pelvic floor PT - suspect dsyynergia - stop iron for now - stop routine use of NSAIDs - use  tylenol for joint pains as preferred modality - recall in for CBC and TIBC / ferritin in 3 months to see if she has recurrent IDA  Jolly Mango, MD [facility]

## 2022-09-29 NOTE — Patient Instructions (Addendum)
If you are age 63 or older, your body mass index should be between 23-30. Your Body mass index is 34.19 kg/m. If this is out of the aforementioned range listed, please consider follow up with your Primary Care Provider.  If you are age 83 or younger, your body mass index should be between 19-25. Your Body mass index is 34.19 kg/m. If this is out of the aformentioned range listed, please consider follow up with your Primary Care Provider.   ________________________________________________________   We are referring you to Pelvic Floor Physical Therapy.  They will contact you directly to schedule an appointment.  It may take a week or more before you hear from them.  Please feel free to contact us if you have not heard from them within 2 weeks and we will follow up on the referral.    Stop iron  Avoid NSAIDs.  Use Tylenol as needed for joint pain  We have sent the following medications to your pharmacy for you to pick up at your convenience: [place] will be due for labs in April.  Our lab is located in the basement of our building, located at [address]. They are open Monday through Friday from 7:30 am to 5:00 pm. You do not need an appointment.  Thank you for entrusting me with your care and for choosing [facility], Dr. [place] Cellar

## 2022-09-30 ENCOUNTER — Telehealth (INDEPENDENT_AMBULATORY_CARE_PROVIDER_SITE_OTHER): Payer: Medicaid Other | Admitting: Family Medicine

## 2022-09-30 ENCOUNTER — Other Ambulatory Visit (INDEPENDENT_AMBULATORY_CARE_PROVIDER_SITE_OTHER): Payer: Self-pay | Admitting: Internal Medicine

## 2022-09-30 ENCOUNTER — Other Ambulatory Visit (HOSPITAL_COMMUNITY): Payer: Self-pay

## 2022-09-30 ENCOUNTER — Encounter (INDEPENDENT_AMBULATORY_CARE_PROVIDER_SITE_OTHER): Payer: Self-pay | Admitting: Family Medicine

## 2022-09-30 DIAGNOSIS — E669 Obesity, unspecified: Secondary | ICD-10-CM | POA: Diagnosis not present

## 2022-09-30 DIAGNOSIS — E119 Type 2 diabetes mellitus without complications: Secondary | ICD-10-CM

## 2022-09-30 DIAGNOSIS — Z7984 Long term (current) use of oral hypoglycemic drugs: Secondary | ICD-10-CM

## 2022-09-30 DIAGNOSIS — Z6834 Body mass index (BMI) 34.0-34.9, adult: Secondary | ICD-10-CM | POA: Diagnosis not present

## 2022-09-30 DIAGNOSIS — E559 Vitamin D deficiency, unspecified: Secondary | ICD-10-CM

## 2022-09-30 DIAGNOSIS — Z7985 Long-term (current) use of injectable non-insulin antidiabetic drugs: Secondary | ICD-10-CM

## 2022-09-30 MED ORDER — SEMAGLUTIDE (2 MG/DOSE) 8 MG/3ML ~~LOC~~ SOPN
2.0000 mg | PEN_INJECTOR | SUBCUTANEOUS | 0 refills | Status: DC
  Filled 2022-09-30: qty 3, 28d supply, fill #0

## 2022-09-30 NOTE — Progress Notes (Signed)
TeleHealth Visit:  This visit was completed with telemedicine (audio/video) technology. Meghan Wilson has verbally consented to this TeleHealth visit. The patient is located at home, the provider is located at home. The participants in this visit include the listed provider and patient. The visit was conducted today via MyChart video.  OBESITY Meghan Wilson is here to discuss her progress with her obesity treatment plan along with follow-up of her obesity related diagnoses.   Today's visit was # 15 Starting weight: 195 lbs Starting date: 11/21/2021 Weight at last in office visit: 192 lbs on 09/04/22 Total weight loss: 3 lbs at last in office visit on 09/04/22. Today's reported weight: 192 lbs   Nutrition Plan: the Category 2 Plan.   Current exercise: walking/marching You Tube video for 15 minutes 2 times per week.  Interim History: She finds it hard to eat all of the cat 2 food,  Journaling her food and aiming for 1200 cal/day and 80 gms of protein.  She is journaling on paper. Has cut out added salt and fried food.  She is using olive oil in her cooking but not measuring or accounting for the calories. Meal preps protein/chicken to use for dinners.   Typical day of food: Breakfast- 2 eggs and homemade protein drink (24 gms) Lunch - Salmon/herbs and spinach Dinner - Salad with chicken or frozen meal Snacks on [employer] cookies and sugar free [employer] candy.. Has sweets cravings.   She is able to fit into clothes she couldn't previously wear.   She found out she can get a free [employer] membership through [place] and plans on taking advantage of this.  Assessment/Plan:  1. Type II Diabetes HgbA1c is at goal. Last A1c was 6.4 on 09/04/22  On metformin XR 500 mg daily and Ozempic 2 mg weekly.  Dose of Ozempic increased from 1 mg to 2 mg last office visit.  Notes greater satiety and less hunger. On statin. She knows this is the highest dose of Ozempic and wants to make the most of it so she is  trying hard to adhere closely to plan.   Lab Results  Component Value Date   HGBA1C 6.4 (H) 09/04/2022   HGBA1C 6.0 (A) 04/07/2022   HGBA1C 5.9 (A) 12/18/2021   Lab Results  Component Value Date   LDLCALC 63 09/04/2022   CREATININE 0.53 (L) 09/04/2022    Plan: Refill Ozempic 2 mg weekly.  2. Vitamin D Deficiency Vitamin D level check last office visit and was 95, up from 27 in March 2023. She is on weekly prescription Vitamin D 50,000 IU.  Lab Results  Component Value Date   VD25OH 54.3 09/04/2022   VD25OH 34.2 11/21/2021    Plan: Continue prescription vitamin D 50,000 IU weekly.   3. Obesity: Current BMI 34 [name] is currently in the action stage of change. As such, her goal is to continue with weight loss efforts.  She has agreed to keeping a food journal and adhering to recommended goals of 1200 calories and 80 gms protein daily.  1.  Switch to journaling rather than category 2. 2. Encouraged her to try using the lose it app but she can continue to lock with paper and pencil if this works best. 3.  Measure all foods and including journaling-especially olive oil. 4.  Try using spray olive oil.  Exercise goals: Continue exercise 3 days/week-either the walking video or going to the [facility] and doing a combo of cardio and resistance.  Behavioral modification strategies: increasing lean protein  intake, decreasing simple carbohydrates, meal planning and cooking strategies, better snacking choices, and planning for success.  [place] has agreed to follow-up with our clinic in 2 weeks.   No orders of the defined types were placed in this encounter.   Medications Discontinued During This Encounter  Medication Reason   Semaglutide, 2 MG/DOSE, 8 MG/3ML SOPN Reorder     Meds ordered this encounter  Medications   Semaglutide, 2 MG/DOSE, 8 MG/3ML SOPN    Sig: Inject 2 mg as directed once a week.    Dispense:  3 mL    Refill:  0    Order Specific Question:   Supervising  Provider    Answer:   Dell Ponto [[id number]]      Objective:   VITALS: Per patient if applicable, see vitals. GENERAL: Alert and in no acute distress. CARDIOPULMONARY: No increased WOB. Speaking in clear sentences.  PSYCH: Pleasant and cooperative. Speech normal rate and rhythm. Affect is appropriate. Insight and judgement are appropriate. Attention is focused, linear, and appropriate.  NEURO: Oriented as arrived to appointment on time with no prompting.   Lab Results  Component Value Date   CREATININE 0.53 (L) 09/04/2022   BUN 7 (L) 09/04/2022   NA 141 09/04/2022   K 4.3 09/04/2022   CL 104 09/04/2022   CO2 24 09/04/2022   Lab Results  Component Value Date   ALT 19 09/04/2022   AST 19 09/04/2022   ALKPHOS 119 09/04/2022   BILITOT <0.2 09/04/2022   Lab Results  Component Value Date   HGBA1C 6.4 (H) 09/04/2022   HGBA1C 6.0 (A) 04/07/2022   HGBA1C 5.9 (A) 12/18/2021   HGBA1C 6.2 (A) 08/22/2021   HGBA1C 6.3 (A) 05/02/2021   Lab Results  Component Value Date   INSULIN 5.8 11/21/2021   Lab Results  Component Value Date   TSH 0.976 11/21/2021   Lab Results  Component Value Date   CHOL 144 09/04/2022   HDL 70 09/04/2022   LDLCALC 63 09/04/2022   TRIG 52 09/04/2022   CHOLHDL 2.7 05/02/2021   Lab Results  Component Value Date   WBC 7.6 08/18/2022   HGB 13.3 08/18/2022   HCT 41.7 08/18/2022   MCV 81.3 08/18/2022   PLT 315 08/18/2022   Lab Results  Component Value Date   IRON 81 08/18/2022   TIBC 363 08/18/2022   FERRITIN 151 08/18/2022   Lab Results  Component Value Date   VD25OH 54.3 09/04/2022   VD25OH 34.2 11/21/2021    Attestation Statements:   Reviewed by clinician on day of visit: allergies, medications, problem list, medical history, surgical history, family history, social history, and previous encounter notes.

## 2022-10-02 ENCOUNTER — Ambulatory Visit (INDEPENDENT_AMBULATORY_CARE_PROVIDER_SITE_OTHER): Payer: Medicaid Other | Admitting: Internal Medicine

## 2022-10-03 ENCOUNTER — Other Ambulatory Visit (HOSPITAL_COMMUNITY): Payer: Self-pay

## 2022-10-09 ENCOUNTER — Other Ambulatory Visit (HOSPITAL_COMMUNITY): Payer: Self-pay

## 2022-10-09 ENCOUNTER — Other Ambulatory Visit: Payer: Self-pay

## 2022-10-09 ENCOUNTER — Encounter: Payer: Self-pay | Admitting: Nurse Practitioner

## 2022-10-09 MED ORDER — ERYTHROMYCIN 5 MG/GM OP OINT
TOPICAL_OINTMENT | Freq: Three times a day (TID) | OPHTHALMIC | 0 refills | Status: DC
  Filled 2022-10-09: qty 3.5, 7d supply, fill #0

## 2022-10-09 MED ORDER — PRAZOSIN HCL 1 MG PO CAPS
3.0000 mg | ORAL_CAPSULE | Freq: Every day | ORAL | 2 refills | Status: DC
  Filled 2022-10-09: qty 90, 30d supply, fill #0

## 2022-10-09 MED ORDER — BACITRACIN-POLYMYXIN B 500-10000 UNIT/GM OP OINT
TOPICAL_OINTMENT | Freq: Three times a day (TID) | OPHTHALMIC | 0 refills | Status: DC
  Filled 2022-10-09: qty 3.5, 7d supply, fill #0

## 2022-10-09 MED ORDER — DOXEPIN HCL 10 MG PO CAPS
10.0000 mg | ORAL_CAPSULE | Freq: Every evening | ORAL | 3 refills | Status: DC | PRN
  Filled 2022-10-09: qty 30, 30d supply, fill #0

## 2022-10-09 MED ORDER — LAMOTRIGINE 100 MG PO TABS
100.0000 mg | ORAL_TABLET | Freq: Two times a day (BID) | ORAL | 3 refills | Status: DC
  Filled 2022-10-09 – 2022-12-30 (×2): qty 60, 30d supply, fill #0
  Filled 2023-03-23: qty 60, 30d supply, fill #1
  Filled 2023-05-22: qty 60, 30d supply, fill #2
  Filled 2023-06-17: qty 60, 30d supply, fill #3

## 2022-10-10 ENCOUNTER — Other Ambulatory Visit (HOSPITAL_COMMUNITY): Payer: Self-pay

## 2022-10-15 ENCOUNTER — Other Ambulatory Visit (HOSPITAL_COMMUNITY): Payer: Self-pay

## 2022-10-15 ENCOUNTER — Other Ambulatory Visit: Payer: Self-pay

## 2022-10-16 ENCOUNTER — Other Ambulatory Visit (HOSPITAL_COMMUNITY): Payer: Self-pay

## 2022-10-16 ENCOUNTER — Ambulatory Visit (INDEPENDENT_AMBULATORY_CARE_PROVIDER_SITE_OTHER): Payer: Medicaid Other | Admitting: Internal Medicine

## 2022-10-16 ENCOUNTER — Encounter (INDEPENDENT_AMBULATORY_CARE_PROVIDER_SITE_OTHER): Payer: Self-pay | Admitting: Internal Medicine

## 2022-10-16 VITALS — BP 109/75 | HR 99 | Temp 98.2°F | Ht 63.0 in | Wt 184.0 lb

## 2022-10-16 DIAGNOSIS — E119 Type 2 diabetes mellitus without complications: Secondary | ICD-10-CM | POA: Diagnosis not present

## 2022-10-16 DIAGNOSIS — E669 Obesity, unspecified: Secondary | ICD-10-CM

## 2022-10-16 DIAGNOSIS — G4733 Obstructive sleep apnea (adult) (pediatric): Secondary | ICD-10-CM | POA: Diagnosis not present

## 2022-10-16 DIAGNOSIS — E559 Vitamin D deficiency, unspecified: Secondary | ICD-10-CM | POA: Diagnosis not present

## 2022-10-16 DIAGNOSIS — Z6832 Body mass index (BMI) 32.0-32.9, adult: Secondary | ICD-10-CM

## 2022-10-16 DIAGNOSIS — Z7985 Long-term (current) use of injectable non-insulin antidiabetic drugs: Secondary | ICD-10-CM

## 2022-10-16 DIAGNOSIS — Z7984 Long term (current) use of oral hypoglycemic drugs: Secondary | ICD-10-CM

## 2022-10-16 MED ORDER — SEMAGLUTIDE (2 MG/DOSE) 8 MG/3ML ~~LOC~~ SOPN
2.0000 mg | PEN_INJECTOR | SUBCUTANEOUS | 0 refills | Status: DC
  Filled 2022-10-16: qty 3, 28d supply, fill #0

## 2022-10-16 MED ORDER — VITAMIN D (ERGOCALCIFEROL) 1.25 MG (50000 UNIT) PO CAPS
50000.0000 [IU] | ORAL_CAPSULE | ORAL | 0 refills | Status: DC
  Filled 2022-10-16: qty 12, 84d supply, fill #0

## 2022-10-16 NOTE — Progress Notes (Signed)
Office: [phone]  /  Fax: [phone]  WEIGHT SUMMARY AND BIOMETRICS  Medical Weight Loss Height: 5' 3"$  (1.6 m) Weight: 184 lb (83.5 kg) Temp: 98.2 F (36.8 C) Pulse Rate: 99 BP: 109/75 SpO2: 99 % Fasting: no Labs: no Today's Visit #: 15 Weight at Last VIsit: 192 lb Weight Lost Since Last Visit: 8 lb  Body Fat %: 47 % Fat Mass (lbs): 86.6 lbs Muscle Mass (lbs): 92.8 lbs Total Body Water (lbs): 65 lbs Visceral Fat Rating : 14 Peak Weight: 207 lb Starting Date: 11/21/21 Starting Weight: 195 lb Total Weight Loss (lbs): 11 lb (4.99 kg)    HPI  Chief Complaint: OBESITY  Meghan Wilson is here to discuss her progress with her obesity treatment plan. She is on the the Category 2 Plan and states she is following her eating plan approximately 90 % of the time. She states she is exercising 60 minutes 3 times per week.   Interval History:  Since last office visit she has lost 8 pounds.  Adherence to reduced calorie nutritional plan is good and improved.  She reports adequate satiety and satiation.  She denies abnormal cravings or problems with portion control.  She denies consumption of liquid calories and has transitioned to water.  She has been increasing vegetables and eating lean sources of protein with her 3 meals.  She is also been exercising.  She is on Ozempic without any side effects.   Pharmacotherapy: Ozempic with primary indication of diabetes  PHYSICAL EXAM:  Blood pressure 109/75, pulse 99, temperature 98.2 F (36.8 C), height 5' 3"$  (1.6 m), weight 184 lb (83.5 kg), SpO2 99 %. Body mass index is 32.59 kg/m.  General: She is overweight, cooperative, alert, well developed, and in no acute distress. PSYCH: Has normal mood, affect and thought process.   HEENT: EOMI, sclerae are anicteric. Lungs: Normal breathing effort, no conversational dyspnea. Extremities: No edema.  Neurologic: No gross sensory or motor deficits. No tremors or fasciculations noted.     ASSESSMENT AND PLAN  TREATMENT PLAN FOR OBESITY:  Recommended Dietary Goals  Meghan Wilson is currently in the action stage of change. As such, her goal is to continue weight management plan. She has agreed to the Category 2 Plan.  Behavioral Intervention  We discussed the following Behavioral Modification Strategies today: increasing lean protein intake, increasing vegetables, increase water intake, and think about ways to increase physical activity.  Additional resources provided today: NA  Recommended Physical Activity Goals  Meghan Wilson has been advised to work up to 150 minutes of moderate intensity aerobic activity a week and strengthening exercises 2-3 times per week for cardiovascular health, weight loss maintenance and preservation of muscle mass.   She has agreed to continue physical activity as is.    Pharmacotherapy We discussed various medication options to help Meghan Wilson with her weight loss efforts and we both agreed to continue with nutritional and behavioral strategies.  ASSOCIATED CONDITIONS ADDRESSED TODAY  Type 2 diabetes mellitus without complication, without long-term current use of insulin ([place]) Assessment & Plan: Most recent A1c is 6.4.  She is currently on Ozempic 2 mg a day and metformin without any adverse effects.  She denies any symptoms of hypoglycemia or hypoglycemia.  He will continue with reduced calorie nutritional plan and weight loss.  Continue current regimen  Orders: -     Semaglutide (2 MG/DOSE); Inject 2 mg as directed once a week.  Dispense: 3 mL; Refill: 0  Vitamin D deficiency Assessment & Plan: Most recent  vitamin D levels  Lab Results  Component Value Date   VD25OH 54.3 09/04/2022   VD25OH 34.2 11/21/2021    Have improved.  She will continue supplementation     Orders: -     Vitamin D (Ergocalciferol); Take 1 capsule (50,000 Units total) by mouth every 7 (seven) days.  Dispense: 12 capsule; Refill: 0  OSA on CPAP  Obesity, Current  BMI of 32      DIAGNOSTIC DATA REVIEWED:  BMET    Component Value Date/Time   NA 141 09/04/2022 1027   K 4.3 09/04/2022 1027   CL 104 09/04/2022 1027   CO2 24 09/04/2022 1027   GLUCOSE 93 09/04/2022 1027   GLUCOSE 100 (H) 07/06/2022 0944   BUN 7 (L) 09/04/2022 1027   CREATININE 0.53 (L) 09/04/2022 1027   CALCIUM 9.3 09/04/2022 1027   GFRNONAA >60 07/06/2022 0944   GFRAA 119 05/11/2020 0935   Lab Results  Component Value Date   HGBA1C 6.4 (H) 09/04/2022   HGBA1C 6.5 (H) 04/02/2018   Lab Results  Component Value Date   INSULIN 5.8 11/21/2021   Lab Results  Component Value Date   TSH 0.976 11/21/2021   CBC    Component Value Date/Time   WBC 7.6 08/18/2022 0924   WBC 7.3 07/06/2022 0944   RBC 5.13 (H) 08/18/2022 0924   HGB 13.3 08/18/2022 0924   HGB 11.9 06/25/2022 1420   HCT 41.7 08/18/2022 0924   HCT 38.5 06/25/2022 1420   PLT 315 08/18/2022 0924   PLT 398 06/25/2022 1420   MCV 81.3 08/18/2022 0924   MCV 81 06/25/2022 1420   MCH 25.9 (L) 08/18/2022 0924   MCHC 31.9 08/18/2022 0924   RDW 16.9 (H) 08/18/2022 0924   RDW 13.5 06/25/2022 1420   Iron Studies    Component Value Date/Time   IRON 81 08/18/2022 0924   IRON 24 (L) 06/25/2022 1420   TIBC 363 08/18/2022 0924   TIBC 351 06/25/2022 1420   FERRITIN 151 08/18/2022 0924   FERRITIN 81 06/25/2022 1420   IRONPCTSAT 22 08/18/2022 0924   IRONPCTSAT 7 (LL) 06/25/2022 1420   Lipid Panel     Component Value Date/Time   CHOL 144 09/04/2022 1027   TRIG 52 09/04/2022 1027   HDL 70 09/04/2022 1027   CHOLHDL 2.7 05/02/2021 1158   CHOLHDL 2.9 04/02/2018 1115   VLDL 51 (H) 04/02/2018 1115   LDLCALC 63 09/04/2022 1027   Hepatic Function Panel     Component Value Date/Time   PROT 6.6 09/04/2022 1027   ALBUMIN 4.3 09/04/2022 1027   AST 19 09/04/2022 1027   ALT 19 09/04/2022 1027   ALKPHOS 119 09/04/2022 1027   BILITOT <0.2 09/04/2022 1027      Component Value Date/Time   TSH 0.976 11/21/2021 1007    Nutritional Lab Results  Component Value Date   VD25OH 54.3 09/04/2022   VD25OH 34.2 11/21/2021      Return in about 2 weeks (around 10/30/2022) for For Weight Mangement with Dr. Gerarda Fraction.Marland Kitchen She was informed of the importance of frequent follow up visits to maximize her success with intensive lifestyle modifications for her multiple health conditions.    ATTESTASTION STATEMENTS:  Reviewed by clinician on day of visit: allergies, medications, problem list, medical history, surgical history, family history, social history, and previous encounter notes.   Time spent on visit including pre-visit chart review and post-visit care and charting was 30 minutes.    Thomes Dinning, MD

## 2022-10-16 NOTE — Assessment & Plan Note (Signed)
Most recent vitamin D levels  Lab Results  Component Value Date   VD25OH 54.3 09/04/2022   VD25OH 34.2 11/21/2021    Have improved.  She will continue supplementation

## 2022-10-16 NOTE — Assessment & Plan Note (Signed)
Most recent A1c is 6.4.  She is currently on Ozempic 2 mg a day and metformin without any adverse effects.  She denies any symptoms of hypoglycemia or hypoglycemia.  He will continue with reduced calorie nutritional plan and weight loss.  Continue current regimen

## 2022-10-21 ENCOUNTER — Encounter: Payer: Self-pay | Admitting: Nurse Practitioner

## 2022-10-21 ENCOUNTER — Other Ambulatory Visit (HOSPITAL_COMMUNITY): Payer: Self-pay

## 2022-10-21 MED ORDER — FLUOROMETHOLONE 0.1 % OP SUSP
1.0000 [drp] | Freq: Four times a day (QID) | OPHTHALMIC | 0 refills | Status: AC
  Filled 2022-10-21: qty 5, 25d supply, fill #0

## 2022-10-22 ENCOUNTER — Other Ambulatory Visit: Payer: Self-pay

## 2022-10-22 DIAGNOSIS — E785 Hyperlipidemia, unspecified: Secondary | ICD-10-CM

## 2022-10-23 ENCOUNTER — Telehealth: Payer: Self-pay

## 2022-10-23 ENCOUNTER — Other Ambulatory Visit (HOSPITAL_COMMUNITY): Payer: Self-pay

## 2022-10-23 MED ORDER — ATORVASTATIN CALCIUM 80 MG PO TABS
80.0000 mg | ORAL_TABLET | Freq: Every day | ORAL | 11 refills | Status: DC
  Filled 2022-10-23: qty 30, 30d supply, fill #0
  Filled 2022-11-24: qty 30, 30d supply, fill #1
  Filled 2022-12-30: qty 30, 30d supply, fill #2
  Filled 2023-02-19: qty 30, 30d supply, fill #3
  Filled 2023-03-23: qty 30, 30d supply, fill #4
  Filled 2023-04-27: qty 30, 30d supply, fill #5
  Filled 2023-05-22: qty 30, 30d supply, fill #6
  Filled 2023-07-22: qty 30, 30d supply, fill #7
  Filled 2023-08-31 (×2): qty 30, 30d supply, fill #8
  Filled 2023-10-01: qty 30, 30d supply, fill #9

## 2022-10-23 NOTE — Telephone Encounter (Signed)
Patient called she is requesting a log book so she can record her sugar.

## 2022-10-27 NOTE — Telephone Encounter (Signed)
Patient informed that a Logbook mailed.

## 2022-10-29 ENCOUNTER — Encounter (INDEPENDENT_AMBULATORY_CARE_PROVIDER_SITE_OTHER): Payer: Self-pay | Admitting: Physician Assistant

## 2022-10-29 ENCOUNTER — Ambulatory Visit (INDEPENDENT_AMBULATORY_CARE_PROVIDER_SITE_OTHER): Payer: Medicaid Other | Admitting: Physician Assistant

## 2022-10-29 ENCOUNTER — Other Ambulatory Visit (HOSPITAL_COMMUNITY): Payer: Self-pay

## 2022-10-29 VITALS — BP 106/72 | HR 82 | Temp 98.0°F | Ht 63.0 in | Wt 183.0 lb

## 2022-10-29 DIAGNOSIS — Z6832 Body mass index (BMI) 32.0-32.9, adult: Secondary | ICD-10-CM | POA: Diagnosis not present

## 2022-10-29 DIAGNOSIS — E119 Type 2 diabetes mellitus without complications: Secondary | ICD-10-CM

## 2022-10-29 DIAGNOSIS — E669 Obesity, unspecified: Secondary | ICD-10-CM

## 2022-10-29 DIAGNOSIS — Z7985 Long-term (current) use of injectable non-insulin antidiabetic drugs: Secondary | ICD-10-CM

## 2022-10-29 MED ORDER — SEMAGLUTIDE (2 MG/DOSE) 8 MG/3ML ~~LOC~~ SOPN
2.0000 mg | PEN_INJECTOR | SUBCUTANEOUS | 0 refills | Status: DC
  Filled 2022-10-29: qty 3, 28d supply, fill #0

## 2022-10-29 NOTE — Therapy (Unsigned)
OUTPATIENT PHYSICAL THERAPY FEMALE PELVIC EVALUATION   Patient Name: Meghan Wilson MRN: [id number] DOB:March 09, 1961, 62 y.o., female Today's Date: 10/30/2022  END OF SESSION:  PT End of Session - 10/30/22 0937     Visit Number 1    Date for PT Re-Evaluation 01/22/23    Authorization Type [employer]    PT Start Time 0930    PT Stop Time [id number]    PT Time Calculation (min) 45 min    Activity Tolerance Patient tolerated treatment well    Behavior During Therapy [facility] for tasks assessed/performed             Past Medical History:  Diagnosis Date   Anemia    Anxiety    Arthritis    Back pain    Bipolar depression ([place]) 09/2015   Constipation    Depression 06/27/2021   Dizziness    History of colon polyps    History of kidney stones    History of patellar fracture 2016   History of tibial fracture 2016   Hyperlipidemia    IBS (irritable bowel syndrome)    Joint pain    Night terrors    Night terrors, adult 09/2015   Osteoarthritis    Pain of right knee after injury 03/10/2018   Post concussion syndrome 12/20/2019   Pre-diabetes    Seasonal allergies 11/09/2020   Sleep apnea    uses cpap   Vitamin B 12 deficiency    Vitamin D deficiency    Past Surgical History:  Procedure Laterality Date   ABDOMINAL HYSTERECTOMY     CHOLECYSTECTOMY     CLOSED REDUCTION TIBIAL FRACTURE Right 2003   COLONOSCOPY  2016   [place] Right 09/17/2021   Procedure: Removal of hardware from right tibia;  Surgeon: Paralee Cancel, MD;  Location: [facility];  Service: Orthopedics;  Laterality: Right;   MANDIBLE SURGERY  2022   TOTAL HIP ARTHROPLASTY Left 05/13/2022   Procedure: TOTAL HIP ARTHROPLASTY ANTERIOR APPROACH;  Surgeon: Paralee Cancel, MD;  Location: [facility];  Service: Orthopedics;  Laterality: Left;   TOTAL KNEE ARTHROPLASTY Right 10/17/2021   Procedure: TOTAL KNEE ARTHROPLASTY;  Surgeon: Paralee Cancel, MD;  Location: [facility];  Service: Orthopedics;  Laterality:  Right;   WISDOM TOOTH EXTRACTION     Patient Active Problem List   Diagnosis Date Noted   OSA on CPAP 10/16/2022   Vitamin D deficiency 07/28/2022   Class 2 severe obesity with serious comorbidity and body mass index (BMI) of 35.0 to 35.9 in adult [facility]) 07/28/2022   Tinnitus, right 06/26/2022   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   OSA (obstructive sleep apnea) 04/07/2022   Anemia 04/07/2022   Pain of left hip joint 02/17/2022   Shortened PR interval 11/21/2021   S/P total knee arthroplasty, 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   Chronic knee pain after total replacement of right knee joint 10/02/2021   S/P hardware removal, right tibia 09/17/2021   Pain in joint of right shoulder 09/16/2021   Aortic atherosclerosis ([place]) 07/08/2021   Depression 06/27/2021   Generalized obesity 06/27/2021   Generalized abdominal discomfort 05/06/2021   Vaginal atrophy 11/09/2020   Type II diabetes mellitus ([place]) 02/16/2019   Healthcare maintenance 01/02/2018   Hyperlipidemia 01/01/2018   Peptic gastritis 01/01/2018   Bipolar disorder ([place]) 08/03/2015    PCP: Idamae Schuller, MD  REFERRING PROVIDER: Yetta Flock, MD   REFERRING DIAG: K59.09 (ICD-10-CM) - Chronic constipation D64.9 (ICD-10-CM) - Anemia, unspecified type  THERAPY DIAG:  Cramp and spasm  Rectal pain  Rationale for Evaluation and Treatment: Rehabilitation  ONSET DATE: 2022  SUBJECTIVE:                                                                                                                                                                                           SUBJECTIVE STATEMENT: Patient had sudden onset of constipation. Presses on the right buttocks but still feels the rectum does not relax to get the stool out.  Fluid intake: Yes: water    PAIN:  Are you having pain? Yes NPRS scale: 9/10 Pain location: Anal, lower left side when constipated  Pain type: sharp Pain description: intermittent   Aggravating  factors: having a bowel movement and the stool gets stuck in the pocket Relieving factors: having a bowel movement  PRECAUTIONS: None  WEIGHT BEARING RESTRICTIONS: No  FALLS:  Has patient fallen in last 6 months? No  LIVING ENVIRONMENT: Lives with: lives with their partner  OCCUPATION: disability, goes to the gym 3x per week  PLOF: Independent  PATIENT GOALS: improve bowel movements  PERTINENT HISTORY:  IBS, diverticulosis ; ABDOMINAL HYSTERECTOMY ; CHOLECYSTECTOMY; left THR, Right TKR  BOWEL MOVEMENT: Pain with bowel movement: Yes Type of bowel movement:Type (Bristol Stool Scale) Type 2 without Linzess, Frequency daily and sometimes 2x in the morning, Strain Yes, and Splinting yes , place a finger outside of her rectal area along the right gluteal to push into her rectum from the outside as she feels that that will help her evacuate.  Fully empty rectum: No Leakage: No Fiber supplement: Yes:   Linzess, protein drink  URINATION: No issues Pain with urination: No   INTERCOURSE: No issues  PREGNANCY: Vaginal deliveries 2   OBJECTIVE:   DIAGNOSTIC FINDINGS:  none  PATIENT SURVEYS:  CRAIQ-7: 29  COGNITION: Overall cognitive status: Within functional limits for tasks assessed     SENSATION: Light touch: Appears intact Proprioception: Appears intact    POSTURE: No Significant postural limitations  PELVIC ALIGNMENT:  LUMBARAROM/PROM: Lumbar ROM is full   LOWER EXTREMITY ROM: Bilateral hip ROM WFL   LOWER EXTREMITY MMT:  MMT Right eval Left eval  Hip extension 3/5 3/5  Hip abduction 3/5 3/5   PALPATION:   General  tenderness located throughout the abdomen; weak abdominal contraction, rib angle greater than 90 degrees, difficulty with expansion of the lower rib cage                External Perineal Exam intact  Internal Pelvic Floor tightness in the puborectalis, difficulty with pushing the therapist finger out of the  canal  Patient confirms identification and approves PT to assess internal pelvic floor and treatment Yes  PELVIC MMT:   MMT eval  Internal Anal Sphincter 2/5  External Anal Sphincter 2/5  Puborectalis 2/5  (Blank rows = not tested)        TONE: increased  PROLAPSE: none  TODAY'S TREATMENT:                                                                                                                              DATE: See below  EVAL Trigger Point Dry-Needling  Treatment instructions: Expect mild to moderate muscle soreness. S/S of pneumothorax if dry needled over a lung field, and to seek immediate medical attention should they occur. Patient verbalized understanding of these instructions and education.  Patient Consent Given: Yes Education handout provided: Yes Muscles treated: puborectalis Electrical stimulation performed: No Parameters: N/A Treatment response/outcome: elongation of muscle and trigger point response Manual work to assess for dry needling    PATIENT EDUCATION:  Education details: education on how to have a bowel movement, information on dry needling Person educated: Patient Education method: Consulting civil engineer, Media planner, Corporate treasurer cues, Verbal cues, and Handouts Education comprehension: verbalized understanding, returned demonstration, verbal cues required, tactile cues required, and needs further education  HOME EXERCISE PROGRAM: See above.   ASSESSMENT:  CLINICAL IMPRESSION: Patient is a 62 y.o. female who was seen today for physical therapy evaluation and treatment for chronic constipation. Patient reports issues with constipation 2 years ago. She is only able to have a bowel movement if she takes her Linzess. She would have to push on her right buttock cheek to assist a bowel movement out when not taking Linzess. She would feel the rectum is not relaxing to to let the stool fully come out. Pelvic floor strength is 2/5. She has difficulty with relaxing  the puborectalis. She has tenderness located in the anococcygeal ligament, obturator internist, and puborectalis. She has difficulty with pushing the therapist finger out rectum. Patient will benefit from skilled therapy to improve coordination with her rectal muscles to assist in pushing her stool out.    OBJECTIVE IMPAIRMENTS: decreased coordination, decreased strength, increased muscle spasms, and pain.   ACTIVITY LIMITATIONS: toileting  PARTICIPATION LIMITATIONS: community activity  PERSONAL FACTORS: Time since onset of injury/illness/exacerbation and 1-2 comorbidities: IBS, diverticulosis ; ABDOMINAL HYSTERECTOMY ; CHOLECYSTECTOMY; left THR, Right TKR  are also affecting patient's functional outcome.   REHAB POTENTIAL: Excellent  CLINICAL DECISION MAKING: Stable/uncomplicated  EVALUATION COMPLEXITY: Low   GOALS: Goals reviewed with patient? Yes  SHORT TERM GOALS: Target date: 11/27/22  Patient educated on correct toileting technique with diaphragmatic breathing to assist with pushing stool out.  Baseline:not educated yet Goal status: INITIAL   LONG TERM GOALS: Target date: 01/22/23  Patient independent with advanced HEP for core and pelvic floor strength.  Baseline: not educated yet  Goal status: INITIAL  2.  Patient is able to relax her puborectalis while pushing her stool out so she does not feel the stool is stuck at the opening.  Baseline: Feels the stool stuck at the opening.  Goal status: INITIAL  3.  Pelvic floor strength is >/= 3/5 and able to fully relax the pelvic floor to improve control with toileting.  Baseline: Pelvic floor strength is 2/5 Goal status: INITIAL  4.  CRAIQ-7 score is </= 10 due to decreased frustration of her trying to have a bowel movement with difficulty.  Baseline: CRAIQ-7: 29 Goal status: INITIAL  5.  Patient reports her rectal pain decreased </= 3/10 due to the ability to relax her muscles.  Baseline: Rectal pain 9/10 Goal status:  INITIAL   PLAN:  PT FREQUENCY: 1x/week  PT DURATION: 12 weeks  PLANNED INTERVENTIONS: Therapeutic exercises, Therapeutic activity, Neuromuscular re-education, Balance training, Patient/Family education, Joint mobilization, Dry Needling, Cryotherapy, Moist heat, Ultrasound, Biofeedback, and Manual therapy  PLAN FOR NEXT SESSION: dry needling to puborectalis, manual work to rectum, diaphragmatic breathing to relax the pelvic floor, review toileting   Earlie Counts, PT 10/30/22 2:21 PM

## 2022-10-29 NOTE — Progress Notes (Signed)
Office: [phone]  /  Fax: [phone]  WEIGHT SUMMARY AND BIOMETRICS  Vitals Temp: 98 F (36.7 C) BP: 106/72 Pulse Rate: 82 SpO2: 99 %   Anthropometric Measurements Height: '5\' 3"'$  (1.6 m) Weight: 183 lb (83 kg) BMI (Calculated): 32.43   Body Composition  Body Fat %: 44.5 % Fat Mass (lbs): 81.4 lbs Muscle Mass (lbs): 96.4 lbs Total Body Water (lbs): 65.6 lbs Visceral Fat Rating : 12   Today's visit was #: 16 Starting weight: 195 lbs Starting date: 11/21/21 Today's weight: 183 lbs Today's date: 10/29/22 Total lbs lost to date: 12 lbs Total lbs lost since last in-office visit: 1 lb  HPI  Chief Complaint: OBESITY  Meghan Wilson is here to discuss her progress with her obesity treatment plan. She is on the the Category 2 Plan and states she is following her eating plan approximately 90 % of the time. She states she is exercising Walking and biking 60 minutes 3 times per week.  Interval History:  Since last office visit she has joined the [facility] and is exercising regularly. Muscle mass is up on bio impedence scale today.  She reports good. Adherence to prescribed reduced calorie nutrition plan. Has been working on decreasing simple carbohydrates. She has stopped using mayo and decreased her salt intake. She is using herbs to season her food.  Denies problems with appetite and hunger signals.  Denies problems with satiety and satiation.  Denies problems with eating patterns and portion control.   Stress levels are reported as low and manageable.  Barriers identified none.   Pharmacotherapy for weight loss: She is currently taking no anti-obesity medication and Ozempic with diabetes as the primary indication.     ASSESSMENT AND PLAN  TREATMENT PLAN FOR OBESITY:  Recommended Dietary Goals  Meghan Wilson is currently in the action stage of change. As such, her goal is to continue weight management plan. She has agreed to the Category 2 Plan.  Behavioral  Intervention  We discussed the following Behavioral Modification Strategies today: increasing lean protein intake, increasing vegetables, increasing water intake, and work on meal planning and easy cooking plans.  Additional resources provided today: NA  Recommended Physical Activity Goals  Meghan Wilson has been advised to work up to 150 minutes of moderate intensity aerobic activity a week and strengthening exercises 2-3 times per week for cardiovascular health, weight loss maintenance and preservation of muscle mass.   She has agreed to : continue physical activity as is.   Pharmacotherapy We discussed various medication options to help Meghan Wilson with her weight loss efforts and we both agreed to continue with nutritional and behavioral strategies.  ASSOCIATED CONDITIONS ADDRESSED TODAY  Type 2 diabetes mellitus without complication, without long-term current use of insulin ([facility]) -     Semaglutide (2 MG/DOSE); Inject 2 mg as directed once a week.  Dispense: 3 mL; Refill: 0  Obesity ([facility])-start bmi 35.67  BMI 32.0-32.9,adult Current BMI 32.4  Pharmacotherapy for DMT2:  She is currently taking Ozempic 2 mg weekly and metformin 500 mg daily.  .  Denies side effects.   Last A1c was 6.4. No hypoglycemia.  On statin therapy.   Plan: Continue Ozempic 2 mg weekly and metformin 500 mg daily.  Continue working on nutrition plan to decrease simple carbohydrates, increase lean proteins and exercise to promote weight loss and improve glycemic control.    Lab Results  Component Value Date   HGBA1C 6.4 (H) 09/04/2022   HGBA1C 6.0 (A) 04/07/2022   HGBA1C 5.9 (  A) 12/18/2021   Lab Results  Component Value Date   LDLCALC 63 09/04/2022   CREATININE 0.53 (L) 09/04/2022      PHYSICAL EXAM:  Blood pressure 106/72, pulse 82, temperature 98 F (36.7 C), height '5\' 3"'$  (1.6 m), weight 183 lb (83 kg), SpO2 99 %. Body mass index is 32.42 kg/m.  General: She is overweight, cooperative, alert, well  developed, and in no acute distress. PSYCH: Has normal mood, affect and thought process.   HEENT: EOMI, sclerae are anicteric. Lungs: Normal breathing effort, no conversational dyspnea. Extremities: No edema.  Neurologic: No gross sensory or motor deficits. No tremors or fasciculations noted.    DIAGNOSTIC DATA REVIEWED:  BMET    Component Value Date/Time   NA 141 09/04/2022 1027   K 4.3 09/04/2022 1027   CL 104 09/04/2022 1027   CO2 24 09/04/2022 1027   GLUCOSE 93 09/04/2022 1027   GLUCOSE 100 (H) 07/06/2022 0944   BUN 7 (L) 09/04/2022 1027   CREATININE 0.53 (L) 09/04/2022 1027   CALCIUM 9.3 09/04/2022 1027   GFRNONAA >60 07/06/2022 0944   GFRAA 119 05/11/2020 0935   Lab Results  Component Value Date   HGBA1C 6.4 (H) 09/04/2022   HGBA1C 6.5 (H) 04/02/2018   Lab Results  Component Value Date   INSULIN 5.8 11/21/2021   Lab Results  Component Value Date   TSH 0.976 11/21/2021   CBC    Component Value Date/Time   WBC 7.6 08/18/2022 0924   WBC 7.3 07/06/2022 0944   RBC 5.13 (H) 08/18/2022 0924   HGB 13.3 08/18/2022 0924   HGB 11.9 06/25/2022 1420   HCT 41.7 08/18/2022 0924   HCT 38.5 06/25/2022 1420   PLT 315 08/18/2022 0924   PLT 398 06/25/2022 1420   MCV 81.3 08/18/2022 0924   MCV 81 06/25/2022 1420   MCH 25.9 (L) 08/18/2022 0924   MCHC 31.9 08/18/2022 0924   RDW 16.9 (H) 08/18/2022 0924   RDW 13.5 06/25/2022 1420   Iron Studies    Component Value Date/Time   IRON 81 08/18/2022 0924   IRON 24 (L) 06/25/2022 1420   TIBC 363 08/18/2022 0924   TIBC 351 06/25/2022 1420   FERRITIN 151 08/18/2022 0924   FERRITIN 81 06/25/2022 1420   IRONPCTSAT 22 08/18/2022 0924   IRONPCTSAT 7 (LL) 06/25/2022 1420   Lipid Panel     Component Value Date/Time   CHOL 144 09/04/2022 1027   TRIG 52 09/04/2022 1027   HDL 70 09/04/2022 1027   CHOLHDL 2.7 05/02/2021 1158   CHOLHDL 2.9 04/02/2018 1115   VLDL 51 (H) 04/02/2018 1115   LDLCALC 63 09/04/2022 1027   Hepatic  Function Panel     Component Value Date/Time   PROT 6.6 09/04/2022 1027   ALBUMIN 4.3 09/04/2022 1027   AST 19 09/04/2022 1027   ALT 19 09/04/2022 1027   ALKPHOS 119 09/04/2022 1027   BILITOT <0.2 09/04/2022 1027      Component Value Date/Time   TSH 0.976 11/21/2021 1007   Nutritional Lab Results  Component Value Date   VD25OH 54.3 09/04/2022   VD25OH 34.2 11/21/2021     Return in about 2 weeks (around 11/12/2022).Marland Kitchen She was informed of the importance of frequent follow up visits to maximize her success with intensive lifestyle modifications for her multiple health conditions.   ATTESTASTION STATEMENTS:  Reviewed by clinician on day of visit: allergies, medications, problem list, medical history, surgical history, family history, social history, and previous encounter  notes.   Time spent on visit including pre-visit chart review and post-visit care and charting was 32 minutes.   Sire Poet,PA-C

## 2022-10-30 ENCOUNTER — Other Ambulatory Visit: Payer: Self-pay

## 2022-10-30 ENCOUNTER — Encounter: Payer: Medicaid Other | Attending: Gastroenterology | Admitting: Physical Therapy

## 2022-10-30 ENCOUNTER — Encounter: Payer: Self-pay | Admitting: Physical Therapy

## 2022-10-30 DIAGNOSIS — K6289 Other specified diseases of anus and rectum: Secondary | ICD-10-CM | POA: Insufficient documentation

## 2022-10-30 DIAGNOSIS — R252 Cramp and spasm: Secondary | ICD-10-CM | POA: Diagnosis present

## 2022-10-30 NOTE — Patient Instructions (Signed)

## 2022-11-02 NOTE — Progress Notes (Unsigned)
CC: PCP follow up  HPI:  Ms.Donell Wheatcroft is a 62 y.o. with medical history of HTN, HLD, DMII, IBS, Bipolar Disorder presenting to [facility] for PCP follow up.   Please see problem-based list for further details, assessments, and plans.  Past Medical History:  Diagnosis Date   Anemia    Anxiety    Arthritis    Back pain    Bipolar depression ([place]) 09/2015   Constipation    Depression 06/27/2021   Dizziness    History of colon polyps    History of kidney stones    History of patellar fracture 2016   History of tibial fracture 2016   Hyperlipidemia    IBS (irritable bowel syndrome)    Joint pain    Night terrors    Night terrors, adult 09/2015   Osteoarthritis    Pain of right knee after injury 03/10/2018   Post concussion syndrome 12/20/2019   Pre-diabetes    Seasonal allergies 11/09/2020   Sleep apnea    uses cpap   Vitamin B 12 deficiency    Vitamin D deficiency     Current Outpatient Medications (Endocrine & Metabolic):    metFORMIN (GLUCOPHAGE-XR) 500 MG 24 hr tablet, Take 1 tablet (500 mg total) by mouth daily with breakfast.   Semaglutide, 2 MG/DOSE, 8 MG/3ML SOPN, Inject 2 mg as directed once a week.  Current Outpatient Medications (Cardiovascular):    atorvastatin (LIPITOR) 80 MG tablet, Take 1 tablet (80 mg total) by mouth daily.   prazosin (MINIPRESS) 1 MG capsule, Take 2 capsules (2 mg total) by mouth at bedtime.   prazosin (MINIPRESS) 1 MG capsule, Take 3 capsules (3 mg total) by mouth at bedtime.   prazosin (MINIPRESS) 1 MG capsule, Take 3 capsules (3 mg total) by mouth at bedtime.  Current Outpatient Medications (Respiratory):    fluticasone (FLONASE) 50 MCG/ACT nasal spray, Place 1 spray into both nostrils daily as needed for allergies or rhinitis  Current Outpatient Medications (Analgesics):    meloxicam (MOBIC) 15 MG tablet, Take 1 tablet (15 mg total) by mouth daily.  Current Outpatient Medications (Hematological):    ferrous sulfate 325 (65 FE) MG  tablet, Take 325 mg by mouth daily with breakfast.  Current Outpatient Medications (Other):    Accu-Chek FastClix Lancets MISC, use 1 strip to check blood sugar up to 7 times a week   bacitracin-polymyxin b (POLYSPORIN) ophthalmic ointment, Apply a small amount into the right eye 3 (three) times daily.   cycloSPORINE (RESTASIS) 0.05 % ophthalmic emulsion, Instill 1 drop into both eyes twice a day   doxepin (SINEQUAN) 10 MG capsule, Take 1 capsule (10 mg total) by mouth at bedtime as needed for sleep   doxepin (SINEQUAN) 10 MG capsule, Take 1 capsule (10 mg total) by mouth at bedtime as needed for sleep   erythromycin ophthalmic ointment, Apply a small amount into the right eye 3 (three) times daily.   fluorometholone (FML) 0.1 % ophthalmic suspension, Place 1 drop into the right eye 4 (four) times daily.   glucose blood (ACCU-CHEK GUIDE) test strip, USE TO CHECK BLOOD GLUCOSE UP TO 7 TIMES A WEEK.   Glycerin-Hypromellose-PEG 400 (DRY EYE RELIEF DROPS) 0.2-0.2-1 % SOLN, Place 1 drop into both eyes in the morning and at bedtime.   lamoTRIgine (LAMICTAL) 100 MG tablet, Take 1 tablet by mouth 2 times a day   linaclotide (LINZESS) 145 MCG CAPS capsule, Take 1 capsule (145 mcg total) by mouth daily before breakfast.  ondansetron (ZOFRAN) 4 MG tablet, Take 1 tablet (4 mg total) by mouth every 6 (six) hours as needed for nausea.   polyethylene glycol (MIRALAX / GLYCOLAX) 17 g packet, Mix 1 packet (17 g total) in 8 oz clear liquid and drink by mouth daily as needed for mild constipation.   pregabalin (LYRICA) 75 MG capsule, Take 1 capsule (75 mg total) by mouth at bedtime as needed. (Patient taking differently: Take 75 mg by mouth at bedtime.)   triamcinolone cream (KENALOG) 0.1 %, Apply 1 application topically daily. Apply underneath breast as needed   Vitamin D, Ergocalciferol, (DRISDOL) 1.25 MG (50000 UNIT) CAPS capsule, Take 1 capsule (50,000 Units total) by mouth every 7 (seven) days.  Review of  Systems:  Review of system negative unless stated in the problem list or HPI.    Physical Exam:  Vitals:   11/03/22 1515  BP: 120/85  Pulse: (!) 101  Resp: (!) 28  Temp: 98.1 F (36.7 C)  TempSrc: Oral  SpO2: 99%  Weight: 188 lb 14.4 oz (85.7 kg)  Height: '5\' 3"'$  (1.6 m)   Filed Weights   11/03/22 1515  Weight: 188 lb 14.4 oz (85.7 kg)   Physical Exam General: NAD HENT: NCAT Lungs: CTAB, no wheeze, rhonchi or rales.  Cardiovascular: Normal heart sounds, no r/m/g, 2+ pulses in all extremities. No LE edema Abdomen: No TTP, normal bowel sounds MSK: No asymmetry or muscle atrophy.  Skin: no lesions noted on exposed skin Neuro: Alert and oriented x4. CN grossly intact Psych: Normal mood and normal affect   Assessment & Plan:   No problem-specific Assessment & Plan notes found for this encounter.   See Encounters Tab for problem based charting.  Patient Discussed with Dr. {NAMES:[id number]::"Guilloud","Hoffman","Mullen","Narendra","Vincent","Machen","Lau","Hatcher","Williams"} Idamae Schuller, MD [facility]. [facility] Internal Medicine Residency, PGY-2   DMII Metformin 500 mg BID qd, Ozempic 2 mg qd  Tinnitus

## 2022-11-03 ENCOUNTER — Ambulatory Visit: Payer: Medicaid Other | Admitting: Internal Medicine

## 2022-11-03 ENCOUNTER — Encounter: Payer: Self-pay | Admitting: Internal Medicine

## 2022-11-03 ENCOUNTER — Other Ambulatory Visit: Payer: Self-pay

## 2022-11-03 VITALS — BP 120/85 | HR 101 | Temp 98.1°F | Resp 28 | Ht 63.0 in | Wt 188.9 lb

## 2022-11-03 DIAGNOSIS — Z7985 Long-term (current) use of injectable non-insulin antidiabetic drugs: Secondary | ICD-10-CM | POA: Diagnosis not present

## 2022-11-03 DIAGNOSIS — Z0000 Encounter for general adult medical examination without abnormal findings: Secondary | ICD-10-CM

## 2022-11-03 DIAGNOSIS — E7801 Familial hypercholesterolemia: Secondary | ICD-10-CM

## 2022-11-03 DIAGNOSIS — H9313 Tinnitus, bilateral: Secondary | ICD-10-CM | POA: Diagnosis present

## 2022-11-03 DIAGNOSIS — Z7984 Long term (current) use of oral hypoglycemic drugs: Secondary | ICD-10-CM

## 2022-11-03 DIAGNOSIS — E119 Type 2 diabetes mellitus without complications: Secondary | ICD-10-CM

## 2022-11-03 NOTE — Patient Instructions (Addendum)
Meghan Wilson, it was a pleasure seeing you today! You endorsed feeling well today. Below are some of the things we talked about this visit. We look forward to seeing you in the follow up appointment!  Today we discussed: Please follow up with neurology as scheduled.  11/17/2022 11:30 AM (Arrive by 11:00 AM) Ward Givens, NP [facility]  It is at [facility].  I placed an urgent referral to audiology for you. Please follow up with them.  I have ordered the following labs today:  Lab Orders  No laboratory test(s) ordered today      Referrals ordered today:    Referral Orders         Ambulatory referral to Audiology      I have ordered the following medication/changed the following medications:   Stop the following medications: There are no discontinued medications.   Start the following medications: No orders of the defined types were placed in this encounter.    Follow-up: 2 months follow up   Please make sure to arrive 15 minutes prior to your next appointment. If you arrive late, you may be asked to reschedule.   We look forward to seeing you next time. Please call our clinic at [phone] if you have any questions or concerns. The best time to call is Monday-Friday from 9am-4pm, but there is someone available 24/7. If after hours or the weekend, call the main hospital number and ask for the Internal Medicine Resident On-Call. If you need medication refills, please notify your pharmacy one week in advance and they will send [place] a request.  Thank you for letting us take part in your care. Wishing you the best!  Thank you, Idamae Schuller, MD

## 2022-11-05 ENCOUNTER — Encounter: Payer: Self-pay | Admitting: Internal Medicine

## 2022-11-05 NOTE — Assessment & Plan Note (Signed)
Will plan for foot exam given her diabetes at next follow-up visit.

## 2022-11-05 NOTE — Assessment & Plan Note (Signed)
Patient presents with bilateral tinnitus.  She states that has been a problem for over a year.  She states the constant ringing that gets worse with loud sounds. Patient reports difficulty hearing as well. Patient is on 2 medications that can cause tinnitus including doxepin and prazosin.  Initially she was stating that this started after initiation of doxepin but later stated that it was a problem before as well.  Advised we will discontinue both of these medications and she should not take them any longer.  Reports she has not had any need for them in the last few weeks.  Patient was seen by ENT and they performed brain imaging which was normal.  Patient states they told her there is nothing ENT can offer her.  On the differential is tinnitus secondary to hearing loss.  She will benefit from an audiological exam.  Mnire's disease was considered but less likely given patient's symptoms are constant rather than episodic.  Patient does not report prominent symptoms of vertigo.  Patient needs audiometric testing for this diagnosis as well so we will obtain this.  Showed by imaging findings but patient still reports the tinnitus is distressing to her so we will continue to work this up.  Patient was referred to neurology at the previous visit has appointment coming up this month.  Advised patient to keep this appointment.  Will place audiology referral as well.

## 2022-11-05 NOTE — Assessment & Plan Note (Signed)
Patient has diabetes and uses Ozempic and metformin.  A1c 2 month ago is well-controlled.  Advised to continue current medications.  If needed her metformin can be increased to 1000 mg twice daily.

## 2022-11-05 NOTE — Assessment & Plan Note (Signed)
Patient has hyperlipidemia and takes atorvastatin 80 mg daily.  Her last lipid panel showed LDL of 63.  Advised to continue Lipitor 80 mg and since her LDL is stable can check yearly lipid panel.

## 2022-11-06 ENCOUNTER — Encounter: Payer: Medicaid Other | Admitting: Physical Therapy

## 2022-11-13 ENCOUNTER — Ambulatory Visit (INDEPENDENT_AMBULATORY_CARE_PROVIDER_SITE_OTHER): Payer: Medicaid Other | Admitting: Internal Medicine

## 2022-11-13 ENCOUNTER — Encounter (INDEPENDENT_AMBULATORY_CARE_PROVIDER_SITE_OTHER): Payer: Self-pay | Admitting: Internal Medicine

## 2022-11-13 ENCOUNTER — Other Ambulatory Visit (HOSPITAL_COMMUNITY): Payer: Self-pay

## 2022-11-13 VITALS — BP 118/79 | HR 86 | Temp 97.9°F | Ht 63.0 in | Wt 182.0 lb

## 2022-11-13 DIAGNOSIS — E119 Type 2 diabetes mellitus without complications: Secondary | ICD-10-CM

## 2022-11-13 DIAGNOSIS — E1169 Type 2 diabetes mellitus with other specified complication: Secondary | ICD-10-CM

## 2022-11-13 DIAGNOSIS — Z7984 Long term (current) use of oral hypoglycemic drugs: Secondary | ICD-10-CM

## 2022-11-13 DIAGNOSIS — E7800 Pure hypercholesterolemia, unspecified: Secondary | ICD-10-CM | POA: Diagnosis not present

## 2022-11-13 DIAGNOSIS — Z7985 Long-term (current) use of injectable non-insulin antidiabetic drugs: Secondary | ICD-10-CM

## 2022-11-13 DIAGNOSIS — E669 Obesity, unspecified: Secondary | ICD-10-CM | POA: Diagnosis not present

## 2022-11-13 DIAGNOSIS — Z6832 Body mass index (BMI) 32.0-32.9, adult: Secondary | ICD-10-CM

## 2022-11-13 MED ORDER — SEMAGLUTIDE (2 MG/DOSE) 8 MG/3ML ~~LOC~~ SOPN
2.0000 mg | PEN_INJECTOR | SUBCUTANEOUS | 0 refills | Status: DC
  Filled 2022-11-13 – 2022-11-24 (×2): qty 3, 28d supply, fill #0

## 2022-11-13 NOTE — Assessment & Plan Note (Signed)
LDL is at goal.  On high intensity statin therapy with atorvastatin 80 mg a day.   Her 10 year risk is: The 10-year ASCVD risk score (Arnett DK, et al., 2019) is: 7.8%  Lab Results  Component Value Date   CHOL 144 09/04/2022   HDL 70 09/04/2022   LDLCALC 63 09/04/2022   TRIG 52 09/04/2022   CHOLHDL 2.7 05/02/2021    Continue high intensity statin therapy no adverse effects reported.  Continue weight loss therapy and reduce saturated fats in diet to less than 10% of daily calories.

## 2022-11-13 NOTE — Assessment & Plan Note (Signed)
We reviewed the ABCs of diabetes today she is meeting all of her goals.  Her A1c is 6.4.  She has an LDL less than 100 and her blood pressure is well-controlled off antihypertensives.  She does not have any microalbumin or indications for an ACE inhibitor at present time.  She is currently on semaglutide 2 mg a week and also metformin.  She has lost 25 pounds close to 13% of her body weight.  She will continue current regimen and continue with nutritional and behavioral strategies for weight management.

## 2022-11-13 NOTE — Progress Notes (Signed)
Office: [phone]  /  Fax: [phone]  WEIGHT SUMMARY AND BIOMETRICS  Vitals Temp: 97.9 F (36.6 C) BP: 118/79 Pulse Rate: 86 SpO2: 99 %   Anthropometric Measurements Height: '5\' 3"'$  (1.6 m) Weight: 182 lb (82.6 kg) BMI (Calculated): 32.25 Weight at Last Visit: 183 lb Weight Lost Since Last Visit: 1 lb Starting Weight: 195 lb Total Weight Loss (lbs): 12 lb (5.443 kg) Peak Weight: 207 lb   Body Composition  Body Fat %: 44.5 % Fat Mass (lbs): 81 lbs Muscle Mass (lbs): 96 lbs Total Body Water (lbs): 65 lbs Visceral Fat Rating : 12    HPI  Chief Complaint: OBESITY  Meghan Wilson is here to discuss her progress with her obesity treatment plan. She is on the the Category 2 Plan and states she is following her eating plan approximately 85 % of the time. She states she is not exercising.  Interval History:  Since last office visit she has lost 1 lb , total 25 for 13% She reports good adherence to reduced calorie nutritional plan. She has been working on thinking of starting to exercise and starting to [facility] '[x]'$ Denies '[]'$ Reports problems with appetite and hunger signals.  '[x]'$ Denies '[]'$ Reports problems with satiety and satiation.  '[x]'$ Denies '[]'$ Reports problems with eating patterns and portion control.  '[x]'$ Denies '[]'$ Reports abnormal cravings Sleeping approximately 8 hours a day.  Sleep described as: '[x]'$ Restorative '[]'$ Unrestorative '[]'$ Interrupted Stress levels are reported as low and manageable.  Barriers identified none.   Pharmacotherapy for weight loss: She is currently taking Metformin (off label use for incretin effect and / or insulin resistance and / or diabetes prevention)  and Ozempic with diabetes as the primary indication.   Weight promoting medications identified: Antiepileptics.  ASSESSMENT AND PLAN  TREATMENT PLAN FOR OBESITY:  Recommended Dietary Goals  Meghan Wilson is currently in the action stage of change. As such, her goal is to continue weight management  plan. She has agreed to: the Category 2 Plan.  Behavioral Intervention  We discussed the following Behavioral Modification Strategies today: increasing lean protein intake, increasing vegetables, avoiding skipping meals, and increasing water intake.  Additional resources provided today: Handout on exercise goal setting  Recommended Physical Activity Goals  Meghan Wilson has been advised to work up to 150 minutes of moderate intensity aerobic activity a week and strengthening exercises 2-3 times per week for cardiovascular health, weight loss maintenance and preservation of muscle mass.   She has agreed to :  Increase the intensity, frequency or duration of strengthening exercises , Increase the intensity, frequency or duration of aerobic exercises  , and will begin to go to the [facility]  Pharmacotherapy We discussed various medication options to help Meghan Wilson with her weight loss efforts and we both agreed to : continue current anti-obesity medication regimen  ASSOCIATED CONDITIONS ADDRESSED TODAY  Type 2 diabetes mellitus without complication, without long-term current use of insulin (Meghan Wilson) Assessment & Plan: We reviewed the ABCs of diabetes today she is meeting all of her goals.  Her A1c is 6.4.  She has an LDL less than 100 and her blood pressure is well-controlled off antihypertensives.  She does not have any microalbumin or indications for an ACE inhibitor at present time.  She is currently on semaglutide 2 mg a week and also metformin.  She has lost 25 pounds close to 13% of her body weight.  She will continue current regimen and continue with nutritional and behavioral strategies for weight management.  Orders: -     Hemoglobin A1c  Obesity, Current BMI of 32  Type 2 diabetes mellitus with other specified complication, without long-term current use of insulin (Meghan Wilson) Assessment & Plan: We reviewed the ABCs of diabetes today she is meeting all of her goals.  Her A1c is 6.4.  She has an LDL less  than 100 and her blood pressure is well-controlled off antihypertensives.  She does not have any microalbumin or indications for an ACE inhibitor at present time.  She is currently on semaglutide 2 mg a week and also metformin.  She has lost 25 pounds close to 13% of her body weight.  She will continue current regimen and continue with nutritional and behavioral strategies for weight management.  Orders: -     Semaglutide (2 MG/DOSE); Inject 2 mg as directed once a week.  Dispense: 3 mL; Refill: 0  Pure hypercholesterolemia Assessment & Plan: LDL is at goal.  On high intensity statin therapy with atorvastatin 80 mg a day.   Her 10 year risk is: The 10-year ASCVD risk score (Arnett DK, et al., 2019) is: 7.8%  Lab Results  Component Value Date   CHOL 144 09/04/2022   HDL 70 09/04/2022   LDLCALC 63 09/04/2022   TRIG 52 09/04/2022   CHOLHDL 2.7 05/02/2021    Continue high intensity statin therapy no adverse effects reported.  Continue weight loss therapy and reduce saturated fats in diet to less than 10% of daily calories.           PHYSICAL EXAM:  Blood pressure 118/79, pulse 86, temperature 97.9 F (36.6 C), height '5\' 3"'$  (1.6 m), weight 182 lb (82.6 kg), SpO2 99 %. Body mass index is 32.24 kg/m.  General: She is overweight, cooperative, alert, well developed, and in no acute distress. PSYCH: Has normal mood, affect and thought process.   HEENT: EOMI, sclerae are anicteric. Lungs: Normal breathing effort, no conversational dyspnea. Extremities: No edema.  Neurologic: No gross sensory or motor deficits. No tremors or fasciculations noted.    DIAGNOSTIC DATA REVIEWED:  BMET    Component Value Date/Time   NA 141 09/04/2022 1027   K 4.3 09/04/2022 1027   CL 104 09/04/2022 1027   CO2 24 09/04/2022 1027   GLUCOSE 93 09/04/2022 1027   GLUCOSE 100 (H) 07/06/2022 0944   BUN 7 (L) 09/04/2022 1027   CREATININE 0.53 (L) 09/04/2022 1027   CALCIUM 9.3 09/04/2022 1027   GFRNONAA  >60 07/06/2022 0944   GFRAA 119 05/11/2020 0935   Lab Results  Component Value Date   HGBA1C 6.4 (H) 09/04/2022   HGBA1C 6.5 (H) 04/02/2018   Lab Results  Component Value Date   INSULIN 5.8 11/21/2021   Lab Results  Component Value Date   TSH 0.976 11/21/2021   CBC    Component Value Date/Time   WBC 7.6 08/18/2022 0924   WBC 7.3 07/06/2022 0944   RBC 5.13 (H) 08/18/2022 0924   HGB 13.3 08/18/2022 0924   HGB 11.9 06/25/2022 1420   HCT 41.7 08/18/2022 0924   HCT 38.5 06/25/2022 1420   PLT 315 08/18/2022 0924   PLT 398 06/25/2022 1420   MCV 81.3 08/18/2022 0924   MCV 81 06/25/2022 1420   MCH 25.9 (L) 08/18/2022 0924   MCHC 31.9 08/18/2022 0924   RDW 16.9 (H) 08/18/2022 0924   RDW 13.5 06/25/2022 1420   Iron Studies    Component Value Date/Time   IRON 81 08/18/2022 0924   IRON 24 (L) 06/25/2022 1420   TIBC 363 08/18/2022 0924  TIBC 351 06/25/2022 1420   FERRITIN 151 08/18/2022 0924   FERRITIN 81 06/25/2022 1420   IRONPCTSAT 22 08/18/2022 0924   IRONPCTSAT 7 (LL) 06/25/2022 1420   Lipid Panel     Component Value Date/Time   CHOL 144 09/04/2022 1027   TRIG 52 09/04/2022 1027   HDL 70 09/04/2022 1027   CHOLHDL 2.7 05/02/2021 1158   CHOLHDL 2.9 04/02/2018 1115   VLDL 51 (H) 04/02/2018 1115   LDLCALC 63 09/04/2022 1027   Hepatic Function Panel     Component Value Date/Time   PROT 6.6 09/04/2022 1027   ALBUMIN 4.3 09/04/2022 1027   AST 19 09/04/2022 1027   ALT 19 09/04/2022 1027   ALKPHOS 119 09/04/2022 1027   BILITOT <0.2 09/04/2022 1027      Component Value Date/Time   TSH 0.976 11/21/2021 1007   Nutritional Lab Results  Component Value Date   VD25OH 54.3 09/04/2022   VD25OH 34.2 11/21/2021     Return in about 1 week (around 11/20/2022) for For Weight Mangement with Dr. Gerarda Fraction.Marland Kitchen She was informed of the importance of frequent follow up visits to maximize her success with intensive lifestyle modifications for her multiple health  conditions.   ATTESTASTION STATEMENTS:  Reviewed by clinician on day of visit: allergies, medications, problem list, medical history, surgical history, family history, social history, and previous encounter notes.     Thomes Dinning, MD

## 2022-11-14 LAB — HEMOGLOBIN A1C
Est. average glucose Bld gHb Est-mCnc: 123 mg/dL
Hgb A1c MFr Bld: 5.9 % — ABNORMAL HIGH (ref 4.8–5.6)

## 2022-11-17 ENCOUNTER — Encounter: Payer: Self-pay | Admitting: Adult Health

## 2022-11-17 ENCOUNTER — Ambulatory Visit: Payer: Medicaid Other | Admitting: Adult Health

## 2022-11-17 VITALS — BP 129/73 | HR 92 | Ht 63.0 in | Wt 185.6 lb

## 2022-11-17 DIAGNOSIS — G4733 Obstructive sleep apnea (adult) (pediatric): Secondary | ICD-10-CM | POA: Diagnosis not present

## 2022-11-17 NOTE — Progress Notes (Signed)
PATIENT: Meghan Wilson DOB: [birth year]  REASON FOR VISIT: follow up HISTORY FROM: patient PRIMARY NEUROLOGIST: Dr. Rexene Alberts  Chief Complaint  Patient presents with   Follow-up    Pt in 12 Pt here for CPAP f/u Pt hasn't used CPAP in 3 months Pt states her chest hurts and burns when using CPAP machine  Pt doesn't have CPAP machine for today's visit      HISTORY OF PRESENT ILLNESS: Today 11/17/22:  Meghan Wilson is a 62 y.o. female with a history of OSA on CPAP. Returns today for follow-up.  At her last visit she reported that she was using her machine and reported that it was working well.  She states that she has not used the machine in the last 3 months. she states that she used the machine and afterwards her chest hurt and burn when using it. This has continued despite her not using the CPAP. Had an appt with PCP and mentioned it to her PCP but reports that that visit was focused on tinnitus.   Reports that she has reordered supplies but doesn't want to start using it until she gets her chest checked out.    Tinnitis: was seen at [facility] and MRI brain and angio that was unremarkable.    05/08/2022: Meghan Wilson is a 62 year old female with a history of obstructive sleep apnea on CPAP.  She returns today for her first download.  She reports that the CPAP has been working well for her.  She does feel that the mask is too small for her and it leaks throughout the night.  For that reason some nights she does not use it.  She states that the nights that she does use that she can tell a big difference in her sleep.  She would like to try a different type of mask.    REVIEW OF SYSTEMS: Out of a complete 14 system review of symptoms, the patient complains only of the following symptoms, and all other reviewed systems are negative.   ESS 19  ALLERGIES: Allergies  Allergen Reactions   Aspirin     Upsets stomach in large doses   Depakote [Divalproex Sodium] Other (See Comments)    Hair  fell out   Ibuprofen Nausea And Vomiting   Prednisone Other (See Comments)    Constipation, nervousness All steroids   Latex Rash    HOME MEDICATIONS: Outpatient Medications Prior to Visit  Medication Sig Dispense Refill   Accu-Chek FastClix Lancets MISC use 1 strip to check blood sugar up to 7 times a week 102 each 3   atorvastatin (LIPITOR) 80 MG tablet Take 1 tablet (80 mg total) by mouth daily. 30 tablet 11   bacitracin-polymyxin b (POLYSPORIN) ophthalmic ointment Apply a small amount into the right eye 3 (three) times daily. 3.5 g 0   cycloSPORINE (RESTASIS) 0.05 % ophthalmic emulsion Instill 1 drop into both eyes twice a day 180 each 3   erythromycin ophthalmic ointment Apply a small amount into the right eye 3 (three) times daily. 3.5 g 0   ferrous sulfate 325 (65 FE) MG tablet Take 325 mg by mouth daily with breakfast.     fluorometholone (FML) 0.1 % ophthalmic suspension Place 1 drop into the right eye 4 (four) times daily. 5 mL 0   glucose blood (ACCU-CHEK GUIDE) test strip USE TO CHECK BLOOD GLUCOSE UP TO 7 TIMES A WEEK. 100 strip 3   Glycerin-Hypromellose-PEG 400 (DRY EYE RELIEF DROPS) 0.2-0.2-1 % SOLN  Place 1 drop into both eyes in the morning and at bedtime.     lamoTRIgine (LAMICTAL) 100 MG tablet Take 1 tablet by mouth 2 times a day 60 tablet 3   linaclotide (LINZESS) 145 MCG CAPS capsule Take 1 capsule (145 mcg total) by mouth daily before breakfast. 30 capsule 11   meloxicam (MOBIC) 15 MG tablet Take 1 tablet (15 mg total) by mouth daily. 30 tablet 2   metFORMIN (GLUCOPHAGE-XR) 500 MG 24 hr tablet Take 1 tablet (500 mg total) by mouth daily with breakfast. 90 tablet 3   ondansetron (ZOFRAN) 4 MG tablet Take 1 tablet (4 mg total) by mouth every 6 (six) hours as needed for nausea. 20 tablet 0   polyethylene glycol (MIRALAX / GLYCOLAX) 17 g packet Mix 1 packet (17 g total) in 8 oz clear liquid and drink by mouth daily as needed for mild constipation. 14 each 0   pregabalin  (LYRICA) 75 MG capsule Take 1 capsule (75 mg total) by mouth at bedtime as needed. (Patient taking differently: Take 75 mg by mouth at bedtime.) 30 capsule 5   Semaglutide, 2 MG/DOSE, 8 MG/3ML SOPN Inject 2 mg as directed once a week. 3 mL 0   triamcinolone cream (KENALOG) 0.1 % Apply 1 application topically daily. Apply underneath breast as needed 45 g 3   Vitamin D, Ergocalciferol, (DRISDOL) 1.25 MG (50000 UNIT) CAPS capsule Take 1 capsule (50,000 Units total) by mouth every 7 (seven) days. 12 capsule 0   fluticasone (FLONASE) 50 MCG/ACT nasal spray Place 1 spray into both nostrils daily as needed for allergies or rhinitis 16 g 2   No facility-administered medications prior to visit.    PAST MEDICAL HISTORY: Past Medical History:  Diagnosis Date   Anemia    Anxiety    Arthritis    Back pain    Bipolar depression ([place]) 09/2015   Chronic knee pain after total replacement of right knee joint 10/02/2021   Constipation    Depression 06/27/2021   Dizziness    Generalized abdominal discomfort 05/06/2021   History of colon polyps    History of kidney stones    History of patellar fracture 2016   History of tibial fracture 2016   Hyperlipidemia    IBS (irritable bowel syndrome)    Joint pain    Night terrors    Night terrors, adult 09/2015   OSA (obstructive sleep apnea) 04/07/2022   Osteoarthritis    Pain in joint of right shoulder 09/16/2021   Pain of right knee after injury 03/10/2018   Post concussion syndrome 12/20/2019   Pre-diabetes    S/P hardware removal, right tibia 09/17/2021   S/P total knee arthroplasty, 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   Seasonal allergies 11/09/2020   Sleep apnea    uses cpap   Vaginal atrophy 11/09/2020   Vitamin B 12 deficiency    Vitamin D deficiency     PAST SURGICAL HISTORY: Past Surgical History:  Procedure Laterality Date   ABDOMINAL HYSTERECTOMY     CHOLECYSTECTOMY     CLOSED REDUCTION TIBIAL FRACTURE  Right 2003   COLONOSCOPY  2016   [place] Right 09/17/2021   Procedure: Removal of hardware from right tibia;  Surgeon: Paralee Cancel, MD;  Location: [facility];  Service: Orthopedics;  Laterality: Right;   MANDIBLE SURGERY  2022   TOTAL HIP ARTHROPLASTY Left 05/13/2022   Procedure: TOTAL HIP ARTHROPLASTY ANTERIOR APPROACH;  Surgeon:  Paralee Cancel, MD;  Location: [facility];  Service: Orthopedics;  Laterality: Left;   TOTAL KNEE ARTHROPLASTY Right 10/17/2021   Procedure: TOTAL KNEE ARTHROPLASTY;  Surgeon: Paralee Cancel, MD;  Location: [facility];  Service: Orthopedics;  Laterality: Right;   WISDOM TOOTH EXTRACTION      FAMILY HISTORY: Family History  Problem Relation Age of Onset   Hyperlipidemia Mother    Hypertension Mother    Colon polyps Mother    Heart disease Mother    Diabetes Mother    Depression Mother    Anxiety disorder Mother    Bipolar disorder Mother    Eating disorder Mother    Obesity Mother    Arthritis/Rheumatoid Father    Hyperlipidemia Maternal Aunt    Hypertension Maternal Aunt    Cancer Maternal Aunt        "blood cancer"   Colon polyps Maternal Grandmother    Stroke Other    Colon cancer Neg Hx    Esophageal cancer Neg Hx    Stomach cancer Neg Hx    Rectal cancer Neg Hx    Sleep apnea Neg Hx     SOCIAL HISTORY: Social History   Socioeconomic History   Marital status: Single    Spouse name: Not on file   Number of children: 2   Years of education: 2 years of college   Highest education level: Not on file  Occupational History   Not on file  Tobacco Use   Smoking status: Former    Packs/day: 0.25    Years: 7.00    Additional pack years: 0.00    Total pack years: 1.75    Types: Cigarettes    Quit date: 12/21/2017    Years since quitting: 4.9   Smokeless tobacco: Never  Vaping Use   Vaping Use: Never used  Substance and Sexual Activity   Alcohol use: Yes    Comment: special occasions   Drug use: No   Sexual activity: Yes     Birth control/protection: Surgical    Comment: Celibate  Other Topics Concern   Not on file  Social History Narrative   Lives at home with her fiance.   Right-handed.   No daily use of caffeine.   Social Determinants of Health   Financial Resource Strain: Not on file  Food Insecurity: No Food Insecurity (09/25/2022)   Hunger Vital Sign    Worried About Running Out of Food in the Last Year: Never true    Ran Out of Food in the Last Year: Never true  Transportation Needs: No Transportation Needs (05/14/2022)   PRAPARE - Hydrologist (Medical): No    Lack of Transportation (Non-Medical): No  Physical Activity: Not on file  Stress: Not on file  Social Connections: Moderately Isolated (09/25/2022)   Social Connection and Isolation Panel [NHANES]    Frequency of Communication with Friends and Family: More than three times a week    Frequency of Social Gatherings with Friends and Family: More than three times a week    Attends Religious Services: More than 4 times per year    Active Member of [employer] or Organizations: Not on file    Attends Archivist Meetings: Never    Marital Status: Divorced  Intimate Partner Violence: Not At Risk (09/25/2022)   Humiliation, Afraid, Rape, and Kick questionnaire    Fear of Current or Ex-Partner: No    Emotionally Abused: No    Physically Abused: No  Sexually Abused: No      PHYSICAL EXAM  Vitals:   11/17/22 1123  BP: 129/73  Pulse: 92  Weight: 185 lb 9.6 oz (84.2 kg)  Height: 5\' 3"  (1.6 m)   Body mass index is 32.88 kg/m.  Generalized: Well developed, in no acute distress  Chest: Lungs clear to auscultation bilaterally, heart sounds normal.  Neurological examination  Mentation: Alert oriented to time, place, history taking. Follows all commands speech and language fluent Cranial nerve II-XII: Extraocular movements were full, visual field were full on confrontational test Head turning and shoulder  shrug  were normal and symmetric. Gait and station: Gait is normal.    DIAGNOSTIC DATA (LABS, IMAGING, TESTING) - I reviewed patient records, labs, notes, testing and imaging myself where available.  Lab Results  Component Value Date   WBC 7.6 08/18/2022   HGB 13.3 08/18/2022   HCT 41.7 08/18/2022   MCV 81.3 08/18/2022   PLT 315 08/18/2022      Component Value Date/Time   NA 141 09/04/2022 1027   K 4.3 09/04/2022 1027   CL 104 09/04/2022 1027   CO2 24 09/04/2022 1027   GLUCOSE 93 09/04/2022 1027   GLUCOSE 100 (H) 07/06/2022 0944   BUN 7 (L) 09/04/2022 1027   CREATININE 0.53 (L) 09/04/2022 1027   CALCIUM 9.3 09/04/2022 1027   PROT 6.6 09/04/2022 1027   ALBUMIN 4.3 09/04/2022 1027   AST 19 09/04/2022 1027   ALT 19 09/04/2022 1027   ALKPHOS 119 09/04/2022 1027   BILITOT <0.2 09/04/2022 1027   GFRNONAA >60 07/06/2022 0944   GFRAA 119 05/11/2020 0935   Lab Results  Component Value Date   CHOL 144 09/04/2022   HDL 70 09/04/2022   LDLCALC 63 09/04/2022   TRIG 52 09/04/2022   CHOLHDL 2.7 05/02/2021   Lab Results  Component Value Date   HGBA1C 5.9 (H) 11/13/2022   Lab Results  Component Value Date   VITAMINB12 860 11/21/2021   Lab Results  Component Value Date   TSH 0.976 11/21/2021      ASSESSMENT AND PLAN 62 y.o. year old female  has a past medical history of Anemia, Anxiety, Arthritis, Back pain, Bipolar depression ([place]) (09/2015), Chronic knee pain after total replacement of right knee joint (10/02/2021), Constipation, Depression (06/27/2021), Dizziness, Generalized abdominal discomfort (05/06/2021), History of colon polyps, History of kidney stones, History of patellar fracture (2016), History of tibial fracture (2016), Hyperlipidemia, IBS (irritable bowel syndrome), Joint pain, Night terrors, Night terrors, adult (09/2015), OSA (obstructive sleep apnea) (04/07/2022), Osteoarthritis, Pain in joint of right shoulder (09/16/2021), Pain of right knee after injury  (03/10/2018), Post concussion syndrome (12/20/2019), Pre-diabetes, S/P hardware removal, right tibia (09/17/2021), S/P total knee arthroplasty, right (10/17/2021), S/P total left hip arthroplasty (05/13/2022), Seasonal allergies (11/09/2020), Sleep apnea, Vaginal atrophy (11/09/2020), Vitamin B 12 deficiency, and Vitamin D deficiency. here with:  OSA on CPAP  Not sure how the CPAP would have caused chest pain/burning that has continued despite stopping the CPAP. Discussed with Dr. Rexene Alberts as well and she does not believe symptoms are caused by the CPAP machine Encouraged her to discuss with PCP- GERD? Encouraged patient to restart CPAP machine when she follows up with PCP regarding symptoms. F/U in 6 months or sooner if needed   Ward Givens, MSN, NP-C 11/17/2022, 11:30 AM [facility] [address], [place], [place] [address] [phone]

## 2022-11-17 NOTE — Progress Notes (Signed)
Internal Medicine Clinic Attending  Case discussed with Dr. Khan  At the time of the visit.  We reviewed the resident's history and exam and pertinent patient test results.  I agree with the assessment, diagnosis, and plan of care documented in the resident's note.  

## 2022-11-18 ENCOUNTER — Encounter: Payer: Self-pay | Admitting: Physical Therapy

## 2022-11-18 ENCOUNTER — Encounter: Payer: Medicaid Other | Attending: Gastroenterology | Admitting: Physical Therapy

## 2022-11-18 DIAGNOSIS — D649 Anemia, unspecified: Secondary | ICD-10-CM | POA: Diagnosis not present

## 2022-11-18 DIAGNOSIS — K5909 Other constipation: Secondary | ICD-10-CM | POA: Insufficient documentation

## 2022-11-18 DIAGNOSIS — R252 Cramp and spasm: Secondary | ICD-10-CM | POA: Diagnosis present

## 2022-11-18 DIAGNOSIS — K6289 Other specified diseases of anus and rectum: Secondary | ICD-10-CM | POA: Diagnosis present

## 2022-11-18 NOTE — Therapy (Signed)
OUTPATIENT PHYSICAL THERAPY TREATMENT NOTE   Patient Name: Meghan Wilson MRN: [id number] DOB:05-Apr-1961, 62 y.o., female Today's Date: 11/18/2022  PCP: Idamae Schuller, MD  REFERRING PROVIDER:  Yetta Flock, MD   END OF SESSION:   PT End of Session - 11/18/22 0837     Visit Number 2    Date for PT Re-Evaluation 01/22/23    Authorization Type [employer]    Authorization Time Period 11/06/2022-11/26/2022    Authorization - Visit Number 1    Authorization - Number of Visits 3    PT Start Time 0830    PT Stop Time 0915    PT Time Calculation (min) 45 min    Activity Tolerance Patient tolerated treatment well    Behavior During Therapy [facility] for tasks assessed/performed             Past Medical History:  Diagnosis Date   Anemia    Anxiety    Arthritis    Back pain    Bipolar depression ([place]) 09/2015   Chronic knee pain after total replacement of right knee joint 10/02/2021   Constipation    Depression 06/27/2021   Dizziness    Generalized abdominal discomfort 05/06/2021   History of colon polyps    History of kidney stones    History of patellar fracture 2016   History of tibial fracture 2016   Hyperlipidemia    IBS (irritable bowel syndrome)    Joint pain    Night terrors    Night terrors, adult 09/2015   OSA (obstructive sleep apnea) 04/07/2022   Osteoarthritis    Pain in joint of right shoulder 09/16/2021   Pain of right knee after injury 03/10/2018   Post concussion syndrome 12/20/2019   Pre-diabetes    S/P hardware removal, right tibia 09/17/2021   S/P total knee arthroplasty, 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   Seasonal allergies 11/09/2020   Sleep apnea    uses cpap   Vaginal atrophy 11/09/2020   Vitamin B 12 deficiency    Vitamin D deficiency    Past Surgical History:  Procedure Laterality Date   ABDOMINAL HYSTERECTOMY     CHOLECYSTECTOMY     CLOSED REDUCTION TIBIAL FRACTURE Right 2003   COLONOSCOPY  2016   [place] Right 09/17/2021   Procedure: Removal of hardware from right tibia;  Surgeon: Paralee Cancel, MD;  Location: [facility];  Service: Orthopedics;  Laterality: Right;   MANDIBLE SURGERY  2022   TOTAL HIP ARTHROPLASTY Left 05/13/2022   Procedure: TOTAL HIP ARTHROPLASTY ANTERIOR APPROACH;  Surgeon: Paralee Cancel, MD;  Location: [facility];  Service: Orthopedics;  Laterality: Left;   TOTAL KNEE ARTHROPLASTY Right 10/17/2021   Procedure: TOTAL KNEE ARTHROPLASTY;  Surgeon: Paralee Cancel, MD;  Location: [facility];  Service: Orthopedics;  Laterality: Right;   WISDOM TOOTH EXTRACTION     Patient Active Problem List   Diagnosis Date Noted   OSA on CPAP 10/16/2022   Vitamin D deficiency 07/28/2022   Bilateral tinnitus 06/26/2022   Anemia 04/07/2022   Pain of left hip joint 02/17/2022   Shortened PR interval 11/21/2021   Aortic atherosclerosis ([place]) 07/08/2021   Depression 06/27/2021   Class 2 severe obesity with serious comorbidity and body mass index (BMI) of 35.0 to 35.9 in adult [facility]) 06/27/2021   Type II diabetes mellitus ([employer]) 02/16/2019   Healthcare maintenance 01/02/2018   Hyperlipidemia 01/01/2018   Peptic gastritis 01/01/2018   Bipolar  disorder ([place]) 08/03/2015   REFERRING DIAG: K59.09 (ICD-10-CM) - Chronic constipation D64.9 (ICD-10-CM) - Anemia, unspecified type   THERAPY DIAG:  Cramp and spasm   Rectal pain   Rationale for Evaluation and Treatment: Rehabilitation   ONSET DATE: 2022   SUBJECTIVE:                                                                                                                                                                                            SUBJECTIVE STATEMENT: I am having formed stool. I am not having the problem that the stool is backing up into spot. No pain. Patient has not had to splint to have a bowel movement. I have been using the squatty potty and it helped. I have not had the straining.      PAIN:  Are you  having pain? Yes NPRS scale: 0/10 today 11/18/22 Pain location: Anal, lower left side when constipated   Pain type: sharp Pain description: intermittent    Aggravating factors: having a bowel movement and the stool gets stuck in the pocket Relieving factors: having a bowel movement   PRECAUTIONS: None   WEIGHT BEARING RESTRICTIONS: No   FALLS:  Has patient fallen in last 6 months? No   LIVING ENVIRONMENT: Lives with: lives with their partner   OCCUPATION: disability, goes to the gym 3x per week   PLOF: Independent   PATIENT GOALS: improve bowel movements   PERTINENT HISTORY:  IBS, diverticulosis ; ABDOMINAL HYSTERECTOMY ; CHOLECYSTECTOMY; left THR, Right TKR   BOWEL MOVEMENT: Pain with bowel movement: Yes Type of bowel movement:Type (Bristol Stool Scale) Type 2 without Linzess, Frequency daily and sometimes 2x in the morning, Strain Yes, and Splinting yes , place a finger outside of her rectal area along the right gluteal to push into her rectum from the outside as she feels that that will help her evacuate.  Fully empty rectum: No Leakage: No Fiber supplement: Yes:   Linzess, protein drink   URINATION: No issues Pain with urination: No     INTERCOURSE: No issues   PREGNANCY: Vaginal deliveries 2     OBJECTIVE:    DIAGNOSTIC FINDINGS:  none   PATIENT SURVEYS:  CRAIQ-7: 29   COGNITION: Overall cognitive status: Within functional limits for tasks assessed                          SENSATION: Light touch: Appears intact Proprioception: Appears intact       POSTURE: No Significant postural limitations   PELVIC ALIGNMENT:   LUMBARAROM/PROM: Lumbar ROM is full  LOWER EXTREMITY ROM: Bilateral hip ROM WFL     LOWER EXTREMITY MMT:   MMT Right eval Left eval  Hip extension 3/5 3/5  Hip abduction 3/5 3/5    PALPATION:   General  tenderness located throughout the abdomen; weak abdominal contraction, rib angle greater than 90 degrees, difficulty  with expansion of the lower rib cage                 External Perineal Exam intact                             Internal Pelvic Floor tightness in the puborectalis, difficulty with pushing the therapist finger out of the canal   Patient confirms identification and approves PT to assess internal pelvic floor and treatment Yes   PELVIC MMT:   MMT eval 11/18/22  Internal Anal Sphincter 2/5 3/5, 8s 10x  External Anal Sphincter 2/5 3/5  Puborectalis 2/5 3/5  (Blank rows = not tested)         TONE: average  PROLAPSE: none   TODAY'S TREATMENT:  11/18/22 Manual: Internal pelvic floor techniques: No emotional/communication barriers or cognitive limitation. Patient is motivated to learn. Patient understands and agrees with treatment goals and plan. PT explains patient will be examined in standing, sitting, and lying down to see how their muscles and joints work. When they are ready, they will be asked to remove their underwear so PT can examine their perineum. The patient is also given the option of providing their own chaperone as one is not provided in our facility. The patient also has the right and is explained the right to defer or refuse any part of the evaluation or treatment including the internal exam. With the patient's consent, PT will use one gloved finger to gently assess the muscles of the pelvic floor, seeing how well it contracts and relaxes and if there is muscle symmetry. After, the patient will get dressed and PT and patient will discuss exam findings and plan of care. PT and patient discuss plan of care, schedule, attendance policy and HEP activities.  Going through the anal canal working on the puborectalis, anococcygeal ligament, iliococcygeus to release the trigger points Neuromuscular re-education: Core retraining: Core facilitation: Form correction: Pelvic floor contraction training: Down training: Exercises: Stretches/mobility: Strengthening: Pelvic floor contraction  holding for 8 sec 10x Bridge 15x Supine hip abduction 15 x with red band around the knees Supine hip marching 30x with red band around the knees                                                                                                                         DATE: See below  EVAL Trigger Point Dry-Needling  Treatment instructions: Expect mild to moderate muscle soreness. S/S of pneumothorax if dry needled over a lung field, and to seek immediate medical attention should they occur. Patient verbalized understanding of these instructions and education.  Patient Consent Given: Yes Education handout provided: Yes Muscles treated: puborectalis Electrical stimulation performed: No Parameters: N/A Treatment response/outcome: elongation of muscle and trigger point response Manual work to assess for dry needling     PATIENT EDUCATION: 11/18/22 Education details: Access Code: [email or web address] Person educated: Patient Education method: Explanation, Demonstration, Tactile cues, Verbal cues, and Handouts Education comprehension: verbalized understanding, returned demonstration, verbal cues required, tactile cues required, and needs further education      HOME EXERCISE PROGRAM: 11/18/22 Access Code: [email or web address] URL: [email or web address] Date: 11/18/2022 Prepared by: Earlie Counts  Exercises - Seated Pelvic Floor Contraction  - 2 x daily - 7 x weekly - 1 sets - 10 reps - 8-10 sec hold - Supine Bridge  - 1 x daily - 3 x weekly - 1 sets - 15 reps - Hooklying Clamshell with Resistance  - 1 x daily - 3 x weekly - 1 sets - 15 reps - Supine March with Resistance Band  - 1 x daily - 3 x weekly - 2 sets - 10 reps   ASSESSMENT:   CLINICAL IMPRESSION: Patient is a 62 y.o. female who was seen today for physical therapy  treatment for chronic constipation. Patient pelvic flor strength increased to 3/5 holding for 8 seconds. She was able to push the therapist finger out of the anal canal with  correct breath. He puborectalis was able  to lengthen after the manual work.  She reports no rectal pain since last vist. She is not straining to have a bowel movement since last visit and her stool is formed well. Patient will benefit from skilled therapy to improve coordination with her rectal muscles to assist in pushing her stool out.     OBJECTIVE IMPAIRMENTS: decreased coordination, decreased strength, increased muscle spasms, and pain.    ACTIVITY LIMITATIONS: toileting   PARTICIPATION LIMITATIONS: community activity   PERSONAL FACTORS: Time since onset of injury/illness/exacerbation and 1-2 comorbidities: IBS, diverticulosis ; ABDOMINAL HYSTERECTOMY ; CHOLECYSTECTOMY; left THR, Right TKR  are also affecting patient's functional outcome.    REHAB POTENTIAL: Excellent   CLINICAL DECISION MAKING: Stable/uncomplicated   EVALUATION COMPLEXITY: Low     GOALS: Goals reviewed with patient? Yes   SHORT TERM GOALS: Target date: 11/27/22   Patient educated on correct toileting technique with diaphragmatic breathing to assist with pushing stool out.  Baseline:not educated yet Goal status: Met 11/18/22     LONG TERM GOALS: Target date: 01/22/23   Patient independent with advanced HEP for core and pelvic floor strength.  Baseline: not educated yet Goal status: INITIAL   2.  Patient is able to relax her puborectalis while pushing her stool out so she does not feel the stool is stuck at the opening.  Baseline: Feels the stool stuck at the opening.  Goal status: INITIAL   3.  Pelvic floor strength is >/= 3/5 and able to fully relax the pelvic floor to improve control with toileting.  Baseline: Pelvic floor strength is 2/5 Goal status: met 11/18/22   4.  CRAIQ-7 score is </= 10 due to decreased frustration of her trying to have a bowel movement with difficulty.  Baseline: CRAIQ-7: 29 Goal status: INITIAL   5.  Patient reports her rectal pain decreased </= 3/10 due to the ability to  relax her muscles.  Baseline: Rectal pain 9/10 Goal status: Met 11/18/22     PLAN:   PT FREQUENCY: 1x/week   PT DURATION: 12 weeks   PLANNED INTERVENTIONS: Therapeutic exercises, Therapeutic activity, Neuromuscular re-education, Balance  training, Patient/Family education, Joint mobilization, Dry Needling, Cryotherapy, Moist heat, Ultrasound, Biofeedback, and Manual therapy   PLAN FOR NEXT SESSION: dry needling to puborectalis if in pain, manual work to rectum, if doing well then discharge     Earlie Counts, PT 11/18/22 9:22 AM

## 2022-11-19 ENCOUNTER — Inpatient Hospital Stay: Payer: Medicaid Other | Attending: Nurse Practitioner

## 2022-11-19 ENCOUNTER — Other Ambulatory Visit: Payer: Self-pay

## 2022-11-19 DIAGNOSIS — D509 Iron deficiency anemia, unspecified: Secondary | ICD-10-CM | POA: Insufficient documentation

## 2022-11-19 DIAGNOSIS — D508 Other iron deficiency anemias: Secondary | ICD-10-CM

## 2022-11-19 LAB — CBC WITH DIFFERENTIAL (CANCER CENTER ONLY)
Abs Immature Granulocytes: 0.02 10*3/uL (ref 0.00–0.07)
Basophils Absolute: 0 10*3/uL (ref 0.0–0.1)
Basophils Relative: 1 %
Eosinophils Absolute: 0.1 10*3/uL (ref 0.0–0.5)
Eosinophils Relative: 1 %
HCT: 40.9 % (ref 36.0–46.0)
Hemoglobin: 12.8 g/dL (ref 12.0–15.0)
Immature Granulocytes: 0 %
Lymphocytes Relative: 32 %
Lymphs Abs: 2.1 10*3/uL (ref 0.7–4.0)
MCH: 26.8 pg (ref 26.0–34.0)
MCHC: 31.3 g/dL (ref 30.0–36.0)
MCV: 85.7 fL (ref 80.0–100.0)
Monocytes Absolute: 0.5 10*3/uL (ref 0.1–1.0)
Monocytes Relative: 7 %
Neutro Abs: 3.9 10*3/uL (ref 1.7–7.7)
Neutrophils Relative %: 59 %
Platelet Count: 341 10*3/uL (ref 150–400)
RBC: 4.77 MIL/uL (ref 3.87–5.11)
RDW: 16.3 % — ABNORMAL HIGH (ref 11.5–15.5)
WBC Count: 6.5 10*3/uL (ref 4.0–10.5)
nRBC: 0 % (ref 0.0–0.2)

## 2022-11-19 LAB — FERRITIN: Ferritin: 180 ng/mL (ref 11–307)

## 2022-11-19 LAB — IRON AND IRON BINDING CAPACITY (CC-WL,HP ONLY)
Iron: 69 ug/dL (ref 28–170)
Saturation Ratios: 19 % (ref 10.4–31.8)
TIBC: 365 ug/dL (ref 250–450)
UIBC: 296 ug/dL (ref 148–442)

## 2022-11-24 ENCOUNTER — Telehealth: Payer: Self-pay

## 2022-11-24 ENCOUNTER — Other Ambulatory Visit (HOSPITAL_COMMUNITY): Payer: Self-pay

## 2022-11-24 NOTE — Telephone Encounter (Signed)
Py Meghan Wilson as called this morning 11/24/2022 ,letting her know that her Iron level continue to be resolve and that she was to reduce her oral tablet to 1 tablet 3 time a week and  her f/u appointment was schedule for July. Pt verbalized understanding. Pt was advise to call the office if she had further questions.    Meghan Wilson, ,CMA

## 2022-11-25 ENCOUNTER — Encounter: Payer: Medicaid Other | Admitting: Physical Therapy

## 2022-11-27 ENCOUNTER — Encounter: Payer: Medicaid Other | Admitting: Physical Therapy

## 2022-12-10 ENCOUNTER — Ambulatory Visit (INDEPENDENT_AMBULATORY_CARE_PROVIDER_SITE_OTHER): Payer: Medicaid Other | Admitting: Internal Medicine

## 2022-12-10 ENCOUNTER — Encounter (INDEPENDENT_AMBULATORY_CARE_PROVIDER_SITE_OTHER): Payer: Self-pay | Admitting: Internal Medicine

## 2022-12-10 VITALS — BP 128/74 | HR 60 | Temp 98.1°F | Ht 63.0 in | Wt 179.0 lb

## 2022-12-10 DIAGNOSIS — E119 Type 2 diabetes mellitus without complications: Secondary | ICD-10-CM | POA: Diagnosis not present

## 2022-12-10 DIAGNOSIS — Z6832 Body mass index (BMI) 32.0-32.9, adult: Secondary | ICD-10-CM

## 2022-12-10 DIAGNOSIS — G4733 Obstructive sleep apnea (adult) (pediatric): Secondary | ICD-10-CM

## 2022-12-10 DIAGNOSIS — Z7985 Long-term (current) use of injectable non-insulin antidiabetic drugs: Secondary | ICD-10-CM

## 2022-12-10 DIAGNOSIS — E669 Obesity, unspecified: Secondary | ICD-10-CM

## 2022-12-10 NOTE — Assessment & Plan Note (Signed)
Has been sleeping better on CPAP with reported good compliance. Continue PAP therapy. Weight loss of 15% or more may improve AHI

## 2022-12-10 NOTE — Progress Notes (Signed)
Office: [phone]  /  Fax: [phone]  WEIGHT SUMMARY AND BIOMETRICS  Vitals Temp: 98.1 F (36.7 C) BP: 128/74 Pulse Rate: 60 SpO2: 99 %   Anthropometric Measurements Height: 5\' 3"  (1.6 m) Weight: 179 lb (81.2 kg) BMI (Calculated): 31.72 Weight at Last Visit: 182 lb Weight Lost Since Last Visit: 3 lb Starting Weight: 195 lb Total Weight Loss (lbs): 15 lb (6.804 kg) Peak Weight: 207 lb   Body Composition  Body Fat %: 43.9 % Fat Mass (lbs): 78.8 lbs Muscle Mass (lbs): 95.6 lbs Total Body Water (lbs): 63.6 lbs Visceral Fat Rating : 12    HPI  Chief Complaint: OBESITY  Meghan Wilson is here to discuss her progress with her obesity treatment plan. She is on the the Category 2 Plan and states she is following her eating plan approximately 95 % of the time. She states she is exercising 60 minutes 3 times per week.  Interval History:  Since last office visit she has lost 3 lbs. She reports excellent adherence to reduced calorie nutritional plan.  She has been working on not skipping meals, increasing protein at every meal, eating more fruits, eating more vegetables, drinking more water, avoiding and or reducing liquid calories, making healthier choices, and continues to exercise she has noticed an improvement in energy, mood and sleep. Reports problems with appetite and hunger signals.  Denies problems with satiety and satiation.  Denies problems with eating patterns and portion control.  Denies abnormal cravings. Denies feeling deprived or restricted.   Barriers identified: none.   Pharmacotherapy for weight loss: She is currently taking Ozempic with diabetes as the primary indication.    ASSESSMENT AND PLAN  TREATMENT PLAN FOR OBESITY:  Recommended Dietary Goals  Meghan Wilson is currently in the action stage of change. As such, her goal is to continue weight management plan. She has agreed to: continue current plan  Behavioral Intervention  We discussed the  following Behavioral Modification Strategies today: increasing lean protein intake, decreasing simple carbohydrates , increasing vegetables, and increasing water intake.  Additional resources provided today: None  Recommended Physical Activity Goals  Meghan Wilson has been advised to work up to 150 minutes of moderate intensity aerobic activity a week and strengthening exercises 2-3 times per week for cardiovascular health, weight loss maintenance and preservation of muscle mass.   She has agreed to :  Continue current level of physical activity   Pharmacotherapy We discussed various medication options to help Meghan Wilson with her weight loss efforts and we both agreed to :  continue ozempic at 2 mg/week with primary indication diabetes  ASSOCIATED CONDITIONS ADDRESSED TODAY  Type 2 diabetes mellitus without complication, without long-term current use of insulin Assessment & Plan: We reviewed the ABCs of diabetes today she is meeting all of her goals.  Her A1c is 5.9.  She has an LDL less than 100 and her blood pressure is well-controlled off antihypertensives.  She does not have any microalbumin or indications for an ACE inhibitor at present time.  She is currently on semaglutide 2 mg a week and also metformin but she is now experiencing some side effects to the metformin.  We will discontinue medication.  She has lost 28 pounds close to 15% of her body weight.  She will continue current regimen and continue with nutritional and behavioral strategies for weight management.   Obesity, Current BMI of 32  OSA on CPAP Assessment & Plan: Has been sleeping better on CPAP with reported good compliance. Continue PAP therapy.  Weight loss of 15% or more may improve AHI       PHYSICAL EXAM:  Blood pressure 128/74, pulse 60, temperature 98.1 F (36.7 C), height 5\' 3"  (1.6 m), weight 179 lb (81.2 kg), SpO2 99 %. Body mass index is 31.71 kg/m.  General: She is overweight, cooperative, alert, well  developed, and in no acute distress. PSYCH: Has normal mood, affect and thought process.   HEENT: EOMI, sclerae are anicteric. Lungs: Normal breathing effort, no conversational dyspnea. Extremities: No edema.  Neurologic: No gross sensory or motor deficits. No tremors or fasciculations noted.    DIAGNOSTIC DATA REVIEWED:  BMET    Component Value Date/Time   NA 141 09/04/2022 1027   K 4.3 09/04/2022 1027   CL 104 09/04/2022 1027   CO2 24 09/04/2022 1027   GLUCOSE 93 09/04/2022 1027   GLUCOSE 100 (H) 07/06/2022 0944   BUN 7 (L) 09/04/2022 1027   CREATININE 0.53 (L) 09/04/2022 1027   CALCIUM 9.3 09/04/2022 1027   GFRNONAA >60 07/06/2022 0944   GFRAA 119 05/11/2020 0935   Lab Results  Component Value Date   HGBA1C 5.9 (H) 11/13/2022   HGBA1C 6.5 (H) 04/02/2018   Lab Results  Component Value Date   INSULIN 5.8 11/21/2021   Lab Results  Component Value Date   TSH 0.976 11/21/2021   CBC    Component Value Date/Time   WBC 6.5 11/19/2022 0933   WBC 7.3 07/06/2022 0944   RBC 4.77 11/19/2022 0933   HGB 12.8 11/19/2022 0933   HGB 11.9 06/25/2022 1420   HCT 40.9 11/19/2022 0933   HCT 38.5 06/25/2022 1420   PLT 341 11/19/2022 0933   PLT 398 06/25/2022 1420   MCV 85.7 11/19/2022 0933   MCV 81 06/25/2022 1420   MCH 26.8 11/19/2022 0933   MCHC 31.3 11/19/2022 0933   RDW 16.3 (H) 11/19/2022 0933   RDW 13.5 06/25/2022 1420   Iron Studies    Component Value Date/Time   IRON 69 11/19/2022 0933   IRON 24 (L) 06/25/2022 1420   TIBC 365 11/19/2022 0933   TIBC 351 06/25/2022 1420   FERRITIN 180 11/19/2022 0933   FERRITIN 81 06/25/2022 1420   IRONPCTSAT 19 11/19/2022 0933   IRONPCTSAT 7 (LL) 06/25/2022 1420   Lipid Panel     Component Value Date/Time   CHOL 144 09/04/2022 1027   TRIG 52 09/04/2022 1027   HDL 70 09/04/2022 1027   CHOLHDL 2.7 05/02/2021 1158   CHOLHDL 2.9 04/02/2018 1115   VLDL 51 (H) 04/02/2018 1115   LDLCALC 63 09/04/2022 1027   Hepatic Function  Panel     Component Value Date/Time   PROT 6.6 09/04/2022 1027   ALBUMIN 4.3 09/04/2022 1027   AST 19 09/04/2022 1027   ALT 19 09/04/2022 1027   ALKPHOS 119 09/04/2022 1027   BILITOT <0.2 09/04/2022 1027      Component Value Date/Time   TSH 0.976 11/21/2021 1007   Nutritional Lab Results  Component Value Date   VD25OH 54.3 09/04/2022   VD25OH 34.2 11/21/2021     Return in about 3 weeks (around 12/31/2022) for For Weight Mangement with Dr. Rikki Spearing.Marland Kitchen She was informed of the importance of frequent follow up visits to maximize her success with intensive lifestyle modifications for her multiple health conditions.   ATTESTASTION STATEMENTS:  Reviewed by clinician on day of visit: allergies, medications, problem list, medical history, surgical history, family history, social history, and previous encounter notes.     Meghan Wilson  Gerarda Fraction, MD

## 2022-12-10 NOTE — Assessment & Plan Note (Signed)
We reviewed the ABCs of diabetes today she is meeting all of her goals.  Her A1c is 5.9.  She has an LDL less than 100 and her blood pressure is well-controlled off antihypertensives.  She does not have any microalbumin or indications for an ACE inhibitor at present time.  She is currently on semaglutide 2 mg a week and also metformin but she is now experiencing some side effects to the metformin.  We will discontinue medication.  She has lost 28 pounds close to 15% of her body weight.  She will continue current regimen and continue with nutritional and behavioral strategies for weight management.

## 2022-12-23 ENCOUNTER — Other Ambulatory Visit: Payer: Self-pay | Admitting: Internal Medicine

## 2022-12-23 ENCOUNTER — Other Ambulatory Visit (HOSPITAL_COMMUNITY): Payer: Self-pay

## 2022-12-23 ENCOUNTER — Other Ambulatory Visit (INDEPENDENT_AMBULATORY_CARE_PROVIDER_SITE_OTHER): Payer: Self-pay | Admitting: Internal Medicine

## 2022-12-23 ENCOUNTER — Telehealth (INDEPENDENT_AMBULATORY_CARE_PROVIDER_SITE_OTHER): Payer: Self-pay | Admitting: Internal Medicine

## 2022-12-23 DIAGNOSIS — E1169 Type 2 diabetes mellitus with other specified complication: Secondary | ICD-10-CM

## 2022-12-23 DIAGNOSIS — E119 Type 2 diabetes mellitus without complications: Secondary | ICD-10-CM

## 2022-12-23 MED ORDER — SEMAGLUTIDE (2 MG/DOSE) 8 MG/3ML ~~LOC~~ SOPN
2.0000 mg | PEN_INJECTOR | SUBCUTANEOUS | 0 refills | Status: DC
  Filled 2022-12-23: qty 3, 28d supply, fill #0

## 2022-12-23 NOTE — Telephone Encounter (Signed)
Patient stated that she would be out medication ( Ozempic) before her next appointment and she needs a refill. Patient thought she would be fine until her appointment.

## 2022-12-25 DIAGNOSIS — Z0271 Encounter for disability determination: Secondary | ICD-10-CM

## 2022-12-26 ENCOUNTER — Other Ambulatory Visit (HOSPITAL_COMMUNITY): Payer: Self-pay

## 2022-12-30 ENCOUNTER — Other Ambulatory Visit (HOSPITAL_COMMUNITY): Payer: Self-pay

## 2023-01-05 ENCOUNTER — Other Ambulatory Visit: Payer: Self-pay | Admitting: Student

## 2023-01-05 ENCOUNTER — Ambulatory Visit (INDEPENDENT_AMBULATORY_CARE_PROVIDER_SITE_OTHER): Payer: Medicaid Other | Admitting: Internal Medicine

## 2023-01-05 ENCOUNTER — Other Ambulatory Visit (HOSPITAL_COMMUNITY): Payer: Self-pay

## 2023-01-05 ENCOUNTER — Encounter (INDEPENDENT_AMBULATORY_CARE_PROVIDER_SITE_OTHER): Payer: Self-pay | Admitting: Internal Medicine

## 2023-01-05 VITALS — BP 134/75 | HR 95 | Temp 98.4°F | Ht 63.0 in | Wt 179.0 lb

## 2023-01-05 DIAGNOSIS — E669 Obesity, unspecified: Secondary | ICD-10-CM | POA: Diagnosis not present

## 2023-01-05 DIAGNOSIS — Z7985 Long-term (current) use of injectable non-insulin antidiabetic drugs: Secondary | ICD-10-CM

## 2023-01-05 DIAGNOSIS — E66812 Obesity, class 2: Secondary | ICD-10-CM

## 2023-01-05 DIAGNOSIS — Z6831 Body mass index (BMI) 31.0-31.9, adult: Secondary | ICD-10-CM

## 2023-01-05 DIAGNOSIS — E1169 Type 2 diabetes mellitus with other specified complication: Secondary | ICD-10-CM | POA: Diagnosis not present

## 2023-01-05 DIAGNOSIS — E559 Vitamin D deficiency, unspecified: Secondary | ICD-10-CM | POA: Diagnosis not present

## 2023-01-05 MED ORDER — SEMAGLUTIDE (2 MG/DOSE) 8 MG/3ML ~~LOC~~ SOPN
2.0000 mg | PEN_INJECTOR | SUBCUTANEOUS | 0 refills | Status: DC
Start: 2023-01-05 — End: 2023-02-02
  Filled 2023-01-05: qty 3, 28d supply, fill #0

## 2023-01-05 MED ORDER — LAMOTRIGINE 100 MG PO TABS
100.0000 mg | ORAL_TABLET | Freq: Every day | ORAL | 3 refills | Status: DC
  Filled 2023-01-05 – 2023-06-22 (×2): qty 30, 30d supply, fill #0
  Filled 2023-08-31 (×2): qty 30, 30d supply, fill #1

## 2023-01-05 MED ORDER — VITAMIN D (ERGOCALCIFEROL) 1.25 MG (50000 UNIT) PO CAPS
50000.0000 [IU] | ORAL_CAPSULE | ORAL | 0 refills | Status: DC
Start: 2023-01-05 — End: 2023-02-02
  Filled 2023-01-05: qty 12, 84d supply, fill #0

## 2023-01-05 MED ORDER — DOXEPIN HCL 10 MG PO CAPS
10.0000 mg | ORAL_CAPSULE | Freq: Every evening | ORAL | 3 refills | Status: DC | PRN
  Filled 2023-01-05 – 2023-03-23 (×3): qty 30, 30d supply, fill #0

## 2023-01-05 MED ORDER — PRAZOSIN HCL 1 MG PO CAPS
1.0000 mg | ORAL_CAPSULE | Freq: Every day | ORAL | 3 refills | Status: DC
  Filled 2023-01-05 – 2023-03-23 (×3): qty 30, 30d supply, fill #0
  Filled 2023-09-07: qty 30, 30d supply, fill #1

## 2023-01-05 NOTE — Assessment & Plan Note (Signed)
Most recent vitamin D levels  Lab Results  Component Value Date   VD25OH 54.3 09/04/2022   VD25OH 34.2 11/21/2021    We discussed switching to over-the-counter supplementation.  She will like to continue on high-dose vitamin D as she has had problems with this in the past.  I recommend checking her levels in June to ensure we are not over supplementing patient.

## 2023-01-05 NOTE — Telephone Encounter (Signed)
I am not sure what she take this for.  Please have her follow up if having a rash

## 2023-01-05 NOTE — Progress Notes (Signed)
Office: [phone]  /  Fax: [phone]  WEIGHT SUMMARY AND BIOMETRICS  Vitals Temp: 98.4 F (36.9 C) BP: 134/75 Pulse Rate: 95 SpO2: 98 %   Anthropometric Measurements Height: 5\' 3"  (1.6 m) Weight: 179 lb (81.2 kg) BMI (Calculated): 31.72 Weight at Last Visit: 179 lb Weight Lost Since Last Visit: 0 lb Weight Gained Since Last Visit: 0 lb Starting Weight: 195 lb Total Weight Loss (lbs): 15 lb (6.804 kg) Peak Weight: 207 lb   Body Composition  Body Fat %: 45.6 % Fat Mass (lbs): 82 lbs Muscle Mass (lbs): 92.8 lbs Total Body Water (lbs): 66.6 lbs Visceral Fat Rating : 12    No data recorded Today's Visit #: 18  Starting Date: 11/22/22   HPI  Chief Complaint: OBESITY  Meghan Wilson is here to discuss her progress with her obesity treatment plan. She is on the the Category 2 Plan and states she is following her eating plan approximately 90 % of the time. She states she is exercising 60-75 minutes 4-5 times per week.  Interval History:  Since last office visit she has maintained weight. She reports good adherence to reduced calorie nutritional plan. She has been working on increasing protein intake at every meal, eating more fruits, eating more vegetables, and continues to exercise Denies problems with appetite and hunger signals.  Denies problems with satiety and satiation.  Denies problems with eating patterns and portion control.  Denies abnormal cravings. Denies feeling deprived or restricted.   Barriers identified: none.   Pharmacotherapy for weight loss: She is currently taking Ozempic with diabetes as the primary indication.    ASSESSMENT AND PLAN  TREATMENT PLAN FOR OBESITY:  Recommended Dietary Goals  [name] is currently in the action stage of change. As such, her goal is to continue weight management plan. She has agreed to: keep a food journal with a target of  1100 calories and 90 grams of protein   Behavioral Intervention  We discussed the  following Behavioral Modification Strategies today: increasing lean protein intake, decreasing simple carbohydrates , increasing vegetables, increasing lower glycemic fruits, increasing fiber rich foods, increasing water intake, work on tracking and journaling calories using tracking application, reading food labels , continue to practice mindfulness when eating, and planning for success.  Additional resources provided today: Handout on tracking and journaling using Apps  Recommended Physical Activity Goals  Treona has been advised to work up to 150 minutes of moderate intensity aerobic activity a week and strengthening exercises 2-3 times per week for cardiovascular health, weight loss maintenance and preservation of muscle mass.   She has agreed to :  Continue current level of physical activity   Pharmacotherapy We discussed various medication options to help [name] with her weight loss efforts and we both agreed to :  Continue semaglutide with primary indication of type 2 diabetes  ASSOCIATED CONDITIONS ADDRESSED TODAY  Obesity, Current BMI of 31  Type 2 diabetes mellitus with other specified complication, without long-term current use of insulin (HCC) Assessment & Plan: Her A1c is 5.9.  Morning blood sugars have been at goal.  She is currently on semaglutide 2 mg a week without adverse effects.  She has lost 28 pounds close to 15% of her body weight.  She will continue current regimen and continue with nutritional and behavioral strategies for weight management.  Orders: -     Semaglutide (2 MG/DOSE); Inject 2 mg as directed once a week.  Dispense: 3 mL; Refill: 0  Vitamin D deficiency Assessment &  Plan: Most recent vitamin D levels  Lab Results  Component Value Date   VD25OH 54.3 09/04/2022   VD25OH 34.2 11/21/2021    We discussed switching to over-the-counter supplementation.  She will like to continue on high-dose vitamin D as she has had problems with this in the past.  I  recommend checking her levels in June to ensure we are not over supplementing patient.     Orders: -     Vitamin D (Ergocalciferol); Take 1 capsule (50,000 Units total) by mouth every 7 (seven) days.  Dispense: 12 capsule; Refill: 0    PHYSICAL EXAM:  Blood pressure 134/75, pulse 95, temperature 98.4 F (36.9 C), height 5\' 3"  (1.6 m), weight 179 lb (81.2 kg), SpO2 98 %. Body mass index is 31.71 kg/m.  General: She is overweight, cooperative, alert, well developed, and in no acute distress. PSYCH: Has normal mood, affect and thought process.   HEENT: EOMI, sclerae are anicteric. Lungs: Normal breathing effort, no conversational dyspnea. Extremities: No edema.  Neurologic: No gross sensory or motor deficits. No tremors or fasciculations noted.    DIAGNOSTIC DATA REVIEWED:  BMET    Component Value Date/Time   NA 141 09/04/2022 1027   K 4.3 09/04/2022 1027   CL 104 09/04/2022 1027   CO2 24 09/04/2022 1027   GLUCOSE 93 09/04/2022 1027   GLUCOSE 100 (H) 07/06/2022 0944   BUN 7 (L) 09/04/2022 1027   CREATININE 0.53 (L) 09/04/2022 1027   CALCIUM 9.3 09/04/2022 1027   GFRNONAA >60 07/06/2022 0944   GFRAA 119 05/11/2020 0935   Lab Results  Component Value Date   HGBA1C 5.9 (H) 11/13/2022   HGBA1C 6.5 (H) 04/02/2018   Lab Results  Component Value Date   INSULIN 5.8 11/21/2021   Lab Results  Component Value Date   TSH 0.976 11/21/2021   CBC    Component Value Date/Time   WBC 6.5 11/19/2022 0933   WBC 7.3 07/06/2022 0944   RBC 4.77 11/19/2022 0933   HGB 12.8 11/19/2022 0933   HGB 11.9 06/25/2022 1420   HCT 40.9 11/19/2022 0933   HCT 38.5 06/25/2022 1420   PLT 341 11/19/2022 0933   PLT 398 06/25/2022 1420   MCV 85.7 11/19/2022 0933   MCV 81 06/25/2022 1420   MCH 26.8 11/19/2022 0933   MCHC 31.3 11/19/2022 0933   RDW 16.3 (H) 11/19/2022 0933   RDW 13.5 06/25/2022 1420   Iron Studies    Component Value Date/Time   IRON 69 11/19/2022 0933   IRON 24 (L)  06/25/2022 1420   TIBC 365 11/19/2022 0933   TIBC 351 06/25/2022 1420   FERRITIN 180 11/19/2022 0933   FERRITIN 81 06/25/2022 1420   IRONPCTSAT 19 11/19/2022 0933   IRONPCTSAT 7 (LL) 06/25/2022 1420   Lipid Panel     Component Value Date/Time   CHOL 144 09/04/2022 1027   TRIG 52 09/04/2022 1027   HDL 70 09/04/2022 1027   CHOLHDL 2.7 05/02/2021 1158   CHOLHDL 2.9 04/02/2018 1115   VLDL 51 (H) 04/02/2018 1115   LDLCALC 63 09/04/2022 1027   Hepatic Function Panel     Component Value Date/Time   PROT 6.6 09/04/2022 1027   ALBUMIN 4.3 09/04/2022 1027   AST 19 09/04/2022 1027   ALT 19 09/04/2022 1027   ALKPHOS 119 09/04/2022 1027   BILITOT <0.2 09/04/2022 1027      Component Value Date/Time   TSH 0.976 11/21/2021 1007   Nutritional Lab Results  Component  Value Date   VD25OH 54.3 09/04/2022   VD25OH 34.2 11/21/2021     Return in about 3 weeks (around 01/26/2023) for For Weight Mangement with Dr. Rikki Spearing.Marland Kitchen She was informed of the importance of frequent follow up visits to maximize her success with intensive lifestyle modifications for her multiple health conditions.   ATTESTASTION STATEMENTS:  Reviewed by clinician on day of visit: allergies, medications, problem list, medical history, surgical history, family history, social history, and previous encounter notes.     Worthy Rancher, MD

## 2023-01-05 NOTE — Assessment & Plan Note (Signed)
Her A1c is 5.9.  Morning blood sugars have been at goal.  She is currently on semaglutide 2 mg a week without adverse effects.  She has lost 28 pounds close to 15% of her body weight.  She will continue current regimen and continue with nutritional and behavioral strategies for weight management.

## 2023-01-06 ENCOUNTER — Other Ambulatory Visit (HOSPITAL_COMMUNITY): Payer: Self-pay

## 2023-01-06 ENCOUNTER — Encounter: Payer: Self-pay | Admitting: Physical Therapy

## 2023-01-06 ENCOUNTER — Encounter: Payer: Medicaid Other | Attending: Gastroenterology | Admitting: Physical Therapy

## 2023-01-06 DIAGNOSIS — R252 Cramp and spasm: Secondary | ICD-10-CM | POA: Insufficient documentation

## 2023-01-06 NOTE — Therapy (Signed)
OUTPATIENT PHYSICAL THERAPY TREATMENT NOTE   Patient Name: Meghan Wilson MRN: [id number] DOB:June 07, 1961, 62 y.o., female Today's Date: 01/06/2023  PCP: Gwenevere Abbot, MD  REFERRING PROVIDER: Benancio Deeds, MD   END OF SESSION:   PT End of Session - 01/06/23 1034     Visit Number 3    Date for PT Re-Evaluation 01/22/23    Authorization Type [employer]    Authorization Time Period 12/16/2022-03/09/2023    Authorization - Visit Number 1    Authorization - Number of Visits 6    PT Start Time 1030    PT Stop Time 1115    PT Time Calculation (min) 45 min    Activity Tolerance Patient tolerated treatment well    Behavior During Therapy [facility] for tasks assessed/performed             Past Medical History:  Diagnosis Date   Anemia    Anxiety    Arthritis    Back pain    Bipolar depression (HCC) 09/2015   Chronic knee pain after total replacement of right knee joint 10/02/2021   Constipation    Depression 06/27/2021   Dizziness    Generalized abdominal discomfort 05/06/2021   History of colon polyps    History of kidney stones    History of patellar fracture 2016   History of tibial fracture 2016   Hyperlipidemia    IBS (irritable bowel syndrome)    Joint pain    Night terrors    Night terrors, adult 09/2015   OSA (obstructive sleep apnea) 04/07/2022   Osteoarthritis    Pain in joint of right shoulder 09/16/2021   Pain of right knee after injury 03/10/2018   Post concussion syndrome 12/20/2019   Pre-diabetes    S/P hardware removal, right tibia 09/17/2021   S/P total knee arthroplasty, 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   Seasonal allergies 11/09/2020   Sleep apnea    uses cpap   Vaginal atrophy 11/09/2020   Vitamin B 12 deficiency    Vitamin D deficiency    Past Surgical History:  Procedure Laterality Date   ABDOMINAL HYSTERECTOMY     CHOLECYSTECTOMY     CLOSED REDUCTION TIBIAL FRACTURE Right 2003   COLONOSCOPY  2016   [facility]    HARDWARE REMOVAL Right 09/17/2021   Procedure: Removal of hardware from right tibia;  Surgeon: Durene Romans, MD;  Location: [facility];  Service: Orthopedics;  Laterality: Right;   MANDIBLE SURGERY  2022   TOTAL HIP ARTHROPLASTY Left 05/13/2022   Procedure: TOTAL HIP ARTHROPLASTY ANTERIOR APPROACH;  Surgeon: Durene Romans, MD;  Location: [facility];  Service: Orthopedics;  Laterality: Left;   TOTAL KNEE ARTHROPLASTY Right 10/17/2021   Procedure: TOTAL KNEE ARTHROPLASTY;  Surgeon: Durene Romans, MD;  Location: [facility];  Service: Orthopedics;  Laterality: Right;   WISDOM TOOTH EXTRACTION     Patient Active Problem List   Diagnosis Date Noted   OSA on CPAP 10/16/2022   Vitamin D deficiency 07/28/2022   Bilateral tinnitus 06/26/2022   Anemia 04/07/2022   Pain of left hip joint 02/17/2022   Shortened PR interval 11/21/2021   Aortic atherosclerosis (HCC) 07/08/2021   Depression 06/27/2021   Obesity with current BMI of 31 06/27/2021   Type II diabetes mellitus (HCC) 02/16/2019   Healthcare maintenance 01/02/2018   Hyperlipidemia 01/01/2018   Peptic gastritis 01/01/2018   Bipolar disorder (HCC) 08/03/2015   REFERRING DIAG: K59.09 (ICD-10-CM) - Chronic constipation D64.9 (ICD-10-CM) -  Anemia, unspecified type   THERAPY DIAG:  Cramp and spasm   Rectal pain   Rationale for Evaluation and Treatment: Rehabilitation   ONSET DATE: 2022   SUBJECTIVE:                                                                                                                                                                                            SUBJECTIVE STATEMENT: I only had one episode since last visit. I have not had the blockage part except for the 1 episode. My stool is normally light and floating. The therapy has really helped. I still do not have anal pain or the hard pressure.      PAIN:  Are you having pain? Yes NPRS scale: 0/10 today 11/18/22 Pain location: Anal, lower left side when  constipated   Pain type: sharp Pain description: intermittent    Aggravating factors: having a bowel movement and the stool gets stuck in the pocket Relieving factors: having a bowel movement   PRECAUTIONS: None   WEIGHT BEARING RESTRICTIONS: No   FALLS:  Has patient fallen in last 6 months? No   LIVING ENVIRONMENT: Lives with: lives with their partner   OCCUPATION: disability, goes to the gym 3x per week   PLOF: Independent   PATIENT GOALS: improve bowel movements   PERTINENT HISTORY:  IBS, diverticulosis ; ABDOMINAL HYSTERECTOMY ; CHOLECYSTECTOMY; left THR, Right TKR   BOWEL MOVEMENT: Pain with bowel movement: Yes Type of bowel movement:Type (Bristol Stool Scale) Type 2 without Linzess, Frequency daily and sometimes 2x in the morning, Strain Yes, and Splinting yes , place a finger outside of her rectal area along the right gluteal to push into her rectum from the outside as she feels that that will help her evacuate.  Fully empty rectum: No Leakage: No Fiber supplement: Yes:   Linzess, protein drink   URINATION: No issues Pain with urination: No     INTERCOURSE: No issues   PREGNANCY: Vaginal deliveries 2     OBJECTIVE:    DIAGNOSTIC FINDINGS:  none   PATIENT SURVEYS:  CRAIQ-7: 29 01/06/23 CRAIQ-7 0   COGNITION: Overall cognitive status: Within functional limits for tasks assessed                          SENSATION: Light touch: Appears intact Proprioception: Appears intact       POSTURE: No Significant postural limitations   PELVIC ALIGNMENT:   LUMBARAROM/PROM: Lumbar ROM is full     LOWER EXTREMITY ROM: Bilateral hip ROM WFL     LOWER EXTREMITY MMT:   MMT  Right eval Left eval Right/left 01/06/23  Hip extension 3/5 3/5 3+/5  Hip abduction 3/5 3/5 3+/5    PALPATION:   General  tenderness located throughout the abdomen; weak abdominal contraction, rib angle greater than 90 degrees, difficulty with expansion of the lower rib cage                  External Perineal Exam intact                             Internal Pelvic Floor tightness in the puborectalis, difficulty with pushing the therapist finger out of the canal   Patient confirms identification and approves PT to assess internal pelvic floor and treatment Yes   PELVIC MMT:   MMT eval 11/18/22 01/06/23  Internal Anal Sphincter 2/5 3/5, 8s 10x 4/5  External Anal Sphincter 2/5 3/5 4/5  Puborectalis 2/5 3/5 4/5  (Blank rows = not tested)         TONE: average  PROLAPSE: none   TODAY'S TREATMENT:  01/06/23 Manual: Internal pelvic floor techniques: No emotional/communication barriers or cognitive limitation. Patient is motivated to learn. Patient understands and agrees with treatment goals and plan. PT explains patient will be examined in standing, sitting, and lying down to see how their muscles and joints work. When they are ready, they will be asked to remove their underwear so PT can examine their perineum. The patient is also given the option of providing their own chaperone as one is not provided in our facility. The patient also has the right and is explained the right to defer or refuse any part of the evaluation or treatment including the internal exam. With the patient's consent, PT will use one gloved finger to gently assess the muscles of the pelvic floor, seeing how well it contracts and relaxes and if there is muscle symmetry. After, the patient will get dressed and PT and patient will discuss exam findings and plan of care. PT and patient discuss plan of care, schedule, attendance policy and HEP activities.  Manual work to the puborectalis along the whole muscle going through the rectum Manual work to the iliococcygeus going through the rectum Neuromuscular re-education: Pelvic floor contraction training: Anal contraction holding for 8 sec 10 times with therapist finger in the rectum Pushing the therapist finger out of the canal in sidely   Exercises: Strengthening: Supine hip marching 30x with red band around the knees Sidely clamshell with red band 10x each side Bridge 15x with verbal cues to push knees forward  Supine hip abduction 15 x with red band around the knees Supine hip marching 30x with red band around the knees    11/18/22 Manual: Internal pelvic floor techniques: No emotional/communication barriers or cognitive limitation. Patient is motivated to learn. Patient understands and agrees with treatment goals and plan. PT explains patient will be examined in standing, sitting, and lying down to see how their muscles and joints work. When they are ready, they will be asked to remove their underwear so PT can examine their perineum. The patient is also given the option of providing their own chaperone as one is not provided in our facility. The patient also has the right and is explained the right to defer or refuse any part of the evaluation or treatment including the internal exam. With the patient's consent, PT will use one gloved finger to gently assess the muscles of the pelvic floor, seeing how well it  contracts and relaxes and if there is muscle symmetry. After, the patient will get dressed and PT and patient will discuss exam findings and plan of care. PT and patient discuss plan of care, schedule, attendance policy and HEP activities.  Going through the anal canal working on the puborectalis, anococcygeal ligament, iliococcygeus to release the trigger points Exercises: Strengthening: Pelvic floor contraction holding for 8 sec 10x Bridge 15x Supine hip abduction 15 x with red band around the knees Supine hip marching 30x with red band around the knees                                                                                                                         DATE: See below  EVAL Trigger Point Dry-Needling  Treatment instructions: Expect mild to moderate muscle soreness. S/S of pneumothorax if dry  needled over a lung field, and to seek immediate medical attention should they occur. Patient verbalized understanding of these instructions and education.   Patient Consent Given: Yes Education handout provided: Yes Muscles treated: puborectalis Electrical stimulation performed: No Parameters: N/A Treatment response/outcome: elongation of muscle and trigger point response Manual work to assess for dry needling     PATIENT EDUCATION: 01/06/23 Education details: Access Code: [email or web address] Person educated: Patient Education method: Explanation, Demonstration, Tactile cues, Verbal cues, and Handouts Education comprehension: verbalized understanding, returned demonstration, verbal cues required, tactile cues required, and needs further education       HOME EXERCISE PROGRAM: 01/06/23 Access Code: [email or web address] URL: [email or web address] Date: 01/06/2023 Prepared by: Eulis Foster  Exercises - Seated Pelvic Floor Contraction  - 2 x daily - 7 x weekly - 1 sets - 10 reps - 8-10 sec hold - Supine Bridge  - 1 x daily - 3 x weekly - 1 sets - 15 reps - Hooklying Clamshell with Resistance  - 1 x daily - 3 x weekly - 1 sets - 15 reps - Supine March with Resistance Band  - 1 x daily - 3 x weekly - 2 sets - 10 reps - Clamshell with Resistance  - 1 x daily - 3 x weekly - 2 sets - 10 reps    ASSESSMENT:   CLINICAL IMPRESSION: Patient is a 62 y.o. female who was seen today for physical therapy  treatment for chronic constipation.  Patient is not having issues anymore. She is able to push out the therapist finger. Her pelvic floor strength is now 4/5. She is not having any rectal pain or pressure. She is independent with her HEP and understands how to engage her core with them. Her  CRAIQ-7 score went from 29 to 0. She is ready for discharge.   OBJECTIVE IMPAIRMENTS: decreased coordination, decreased strength, increased muscle spasms, and pain.    ACTIVITY LIMITATIONS: toileting   PARTICIPATION  LIMITATIONS: community activity   PERSONAL FACTORS: Time since onset of injury/illness/exacerbation and 1-2 comorbidities: IBS, diverticulosis ; ABDOMINAL HYSTERECTOMY ; CHOLECYSTECTOMY; left THR, Right TKR  are also affecting patient's functional outcome.  REHAB POTENTIAL: Excellent   CLINICAL DECISION MAKING: Stable/uncomplicated   EVALUATION COMPLEXITY: Low     GOALS: Goals reviewed with patient? Yes   SHORT TERM GOALS: Target date: 11/27/22   Patient educated on correct toileting technique with diaphragmatic breathing to assist with pushing stool out.  Baseline:not educated yet Goal status: Met 11/18/22     LONG TERM GOALS: Target date: 01/22/23   Patient independent with advanced HEP for core and pelvic floor strength.  Baseline: not educated yet Goal status: Met 01/06/23   2.  Patient is able to relax her puborectalis while pushing her stool out so she does not feel the stool is stuck at the opening.  Baseline: Feels the stool stuck at the opening.  Goal status: Met 01/06/23   3.  Pelvic floor strength is >/= 3/5 and able to fully relax the pelvic floor to improve control with toileting.  Baseline: Pelvic floor strength is 2/5 Goal status: met 11/18/22   4.  CRAIQ-7 score is </= 10 due to decreased frustration of her trying to have a bowel movement with difficulty.  Baseline: CRAIQ-7: 29 Goal status: Met 01/06/23   5.  Patient reports her rectal pain decreased </= 3/10 due to the ability to relax her muscles.  Baseline: Rectal pain 9/10 Goal status: Met 11/18/22     PLAN:   Discharge to HEP  Eulis Foster, PT 01/06/23 11:18 AM    PHYSICAL THERAPY DISCHARGE SUMMARY  Visits from Start of Care: 3  Current functional level related to goals / functional outcomes: See above.    Remaining deficits: See above.    Education / Equipment: HEP   Patient agrees to discharge. Patient goals were met. Patient is being discharged due to meeting the stated rehab goals. Thank  you for the referral. Eulis Foster, PT 01/06/23 11:18 AM

## 2023-01-08 ENCOUNTER — Other Ambulatory Visit (HOSPITAL_COMMUNITY): Payer: Self-pay

## 2023-01-08 ENCOUNTER — Telehealth (INDEPENDENT_AMBULATORY_CARE_PROVIDER_SITE_OTHER): Payer: Self-pay | Admitting: Internal Medicine

## 2023-01-08 NOTE — Telephone Encounter (Signed)
Left message, refill sent in 5/6 , enough until her next visit and will renew then

## 2023-01-08 NOTE — Telephone Encounter (Signed)
Pt called 01/08/23 She needs a refill on her Ozempic.She will take her last shot on 01/21/23

## 2023-01-13 ENCOUNTER — Other Ambulatory Visit: Payer: Self-pay

## 2023-01-13 ENCOUNTER — Encounter: Payer: Medicaid Other | Admitting: Physical Therapy

## 2023-01-13 ENCOUNTER — Other Ambulatory Visit (HOSPITAL_COMMUNITY): Payer: Self-pay

## 2023-01-13 ENCOUNTER — Ambulatory Visit: Payer: Medicaid Other | Admitting: Student

## 2023-01-13 ENCOUNTER — Encounter: Payer: Self-pay | Admitting: Student

## 2023-01-13 VITALS — BP 112/71 | HR 87 | Temp 98.2°F | Ht 63.0 in | Wt 182.6 lb

## 2023-01-13 DIAGNOSIS — Z7985 Long-term (current) use of injectable non-insulin antidiabetic drugs: Secondary | ICD-10-CM | POA: Diagnosis not present

## 2023-01-13 DIAGNOSIS — E119 Type 2 diabetes mellitus without complications: Secondary | ICD-10-CM

## 2023-01-13 DIAGNOSIS — R21 Rash and other nonspecific skin eruption: Secondary | ICD-10-CM | POA: Diagnosis present

## 2023-01-13 MED ORDER — TRIAMCINOLONE ACETONIDE 0.1 % EX CREA
1.0000 | TOPICAL_CREAM | Freq: Every day | CUTANEOUS | 2 refills | Status: DC
  Filled 2023-01-13: qty 45, 15d supply, fill #0

## 2023-01-13 MED ORDER — TRIAMCINOLONE ACETONIDE 0.5 % EX OINT
1.0000 | TOPICAL_OINTMENT | Freq: Every day | CUTANEOUS | 2 refills | Status: DC | PRN
  Filled 2023-01-13: qty 30, 10d supply, fill #0

## 2023-01-13 NOTE — Assessment & Plan Note (Signed)
Last A1c 5.9% in March. Well controlled on Ozempic 2 mg weekly. Continues working with [facility]. Foot exam done today without any acute concerns today.   Plan -continue Ozempic

## 2023-01-13 NOTE — Patient Instructions (Signed)
Thank you, Ms.Jamesina Ingles for allowing [place] to provide your care today. Today we discussed your medication refill.   -I refilled your triamcinolone cream for your hand rash/eczema. Please try to use it sparingly as needed. -Foot exam was done today. Please let us know of any new wounds or other concerns.  -Continue your Ozempic. Plan to recheck A1c and decide if you need to be back on metformin.    I have ordered the following medication/changed the following medications:   Stop the following medications: Medications Discontinued During This Encounter  Medication Reason   triamcinolone cream (KENALOG) 0.1 % Reorder     Start the following medications: Meds ordered this encounter  Medications   triamcinolone ointment (KENALOG) 0.5 %    Sig: Apply 1 Application topically daily as needed. Apply to hands as needed.    Dispense:  30 g    Refill:  2   triamcinolone cream (KENALOG) 0.1 %    Sig: Apply 1 Application topically daily. Apply underneath breast as needed    Dispense:  45 g    Refill:  2    IM program     Follow up:  1-2 months    Should you have any questions or concerns please call the internal medicine clinic at [phone].    Rana Snare, D.O. [facility]

## 2023-01-13 NOTE — Progress Notes (Signed)
CC: medication refill  HPI:  [name] is a 62 y.o. female living with a history stated below and presents today for medication refill. Please see problem based assessment and plan for additional details.  Past Medical History:  Diagnosis Date   Anemia    Anxiety    Arthritis    Back pain    Bipolar depression (HCC) 09/2015   Chronic knee pain after total replacement of right knee joint 10/02/2021   Constipation    Depression 06/27/2021   Dizziness    Generalized abdominal discomfort 05/06/2021   History of colon polyps    History of kidney stones    History of patellar fracture 2016   History of tibial fracture 2016   Hyperlipidemia    IBS (irritable bowel syndrome)    Joint pain    Night terrors    Night terrors, adult 09/2015   OSA (obstructive sleep apnea) 04/07/2022   Osteoarthritis    Pain in joint of right shoulder 09/16/2021   Pain of right knee after injury 03/10/2018   Post concussion syndrome 12/20/2019   Pre-diabetes    S/P hardware removal, right tibia 09/17/2021   S/P total knee arthroplasty, 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 30 tablet 11   bacitracin-polymyxin b (POLYSPORIN) ophthalmic ointment Apply a small amount into the right eye 3 (three) times daily. 3.5 g 0   cycloSPORINE (RESTASIS) 0.05 % ophthalmic emulsion Instill 1 drop into both eyes twice a day 180 each 3   doxepin (SINEQUAN) 10 MG capsule Take 1 capsule (10 mg total) by mouth at bedtime as needed for sleep 30 capsule 3   erythromycin ophthalmic  ointment Apply a small amount into the right eye 3 (three) times daily. 3.5 g 0   ferrous sulfate 325 (65 FE) MG tablet Take 325 mg by mouth daily with breakfast.     fluorometholone (FML) 0.1 % ophthalmic suspension Place 1 drop into the right eye 4 (four) times daily. 5 mL 0   fluticasone (FLONASE) 50 MCG/ACT nasal spray Place 1 spray into both nostrils daily as needed for allergies or rhinitis 16 g 2   glucose blood (ACCU-CHEK GUIDE) test strip USE TO CHECK BLOOD GLUCOSE UP TO 7 TIMES A WEEK. 100 strip 3   Glycerin-Hypromellose-PEG 400 (DRY EYE RELIEF DROPS) 0.2-0.2-1 % SOLN Place 1 drop into both eyes in the morning and at bedtime.     lamoTRIgine (LAMICTAL) 100 MG tablet Take 1 tablet by mouth 2 times a day 60 tablet 3   lamoTRIgine (LAMICTAL) 100 MG tablet Take 1 tablet by mouth once a day 30 tablet 3   linaclotide (LINZESS) 145 MCG CAPS capsule Take 1 capsule (145 mcg total) by mouth daily before breakfast. 30 capsule 11   meloxicam (MOBIC) 15 MG tablet Take 1 tablet (15 mg total) by mouth daily. 30 tablet 2   ondansetron (ZOFRAN) 4 MG tablet Take 1 tablet (4 mg total) by mouth every 6 (six) hours  as needed for nausea. 20 tablet 0   polyethylene glycol (MIRALAX / GLYCOLAX) 17 g packet Mix 1 packet (17 g total) in 8 oz clear liquid and drink by mouth daily as needed for mild constipation. 14 each 0   prazosin (MINIPRESS) 1 MG capsule Take 1 capsule by mouth at bedtime 30 capsule 3   pregabalin (LYRICA) 75 MG capsule Take 1 capsule (75 mg total) by mouth at bedtime as needed. (Patient taking differently: Take 75 mg by mouth at bedtime.) 30 capsule 5   Semaglutide, 2 MG/DOSE, 8 MG/3ML SOPN Inject 2 mg as directed once a week. 3 mL 0   Vitamin D, Ergocalciferol, (DRISDOL) 1.25 MG (50000 UNIT) CAPS capsule Take 1 capsule (50,000 Units total) by mouth every 7 (seven) days. 12 capsule 0   No current facility-administered medications on file prior to visit.   Review of Systems: ROS negative except  for what is noted on the assessment and plan.  Vitals:   01/13/23 0847  BP: 112/71  Pulse: 87  Temp: 98.2 F (36.8 C)  TempSrc: Oral  SpO2: 100%  Weight: 182 lb 9.6 oz (82.8 kg)  Height: 5\' 3"  (1.6 m)   Physical Exam: Constitutional: well-appearing female sitting in chair comfortably, in no acute distress Pulmonary/Chest: normal work of breathing on room air Extremities: 2+ DP pulses bilaterally, skin intact of bilateral feet  Neurological: alert & oriented x 3 Skin: warm and dry, few papular lesions of dorsal hands bilaterally without tenderness, surrounding erythema, bleeding or discharge Psych: pleasant mood  Assessment & Plan:   Rash Patient states chronic history of small papular lesions over bilateral hands. Was seen by dermatology a few years ago and no definitive answer per patient. Possible thought to be atopic dermatitis. No recent changes in detergents or soaps. No recent inset bites. Has tried OTC ointments in past without much relief. She has been prescribed triamcinolone ointment which resolves the lesions. She reports only taking it as needed during flare ups and not a daily application. States also triamcinolone cream sparingly for underneath breasts. States cream has helped and she keeps the area dry and clean as much as possible. Does not have active rash under breast now. She understands the risks of topical steroids but states this is the only thing that has helped.   Plan -Refill for triamcinolone 0.1% cream underneath breast daily as needed -Refill for triamcinolone 0.5% ointment to apply to hands daily as needed   Type II diabetes mellitus (HCC) Last A1c 5.9% in March. Well controlled on Ozempic 2 mg weekly. Continues working with [facility]. Foot exam done today without any acute concerns today.   Plan -continue Ozempic    Patient discussed with Dr. Rollene Fare, D.O. [facility] Internal Medicine, PGY-1 Phone:  [phone] Date 01/13/2023 Time 1:10 PM

## 2023-01-13 NOTE — Assessment & Plan Note (Addendum)
Patient states chronic history of small papular lesions over bilateral hands. Was seen by dermatology a few years ago and no definitive answer per patient. Possible thought to be atopic dermatitis. No recent changes in detergents or soaps. No recent inset bites. Has tried OTC ointments in past without much relief. She has been prescribed triamcinolone ointment which resolves the lesions. She reports only taking it as needed during flare ups and not a daily application. States also triamcinolone cream sparingly for underneath breasts. States cream has helped and she keeps the area dry and clean as much as possible. Does not have active rash under breast now. She understands the risks of topical steroids but states this is the only thing that has helped.   Plan -Refill for triamcinolone 0.1% cream underneath breast daily as needed -Refill for triamcinolone 0.5% ointment to apply to hands daily as needed

## 2023-01-16 NOTE — Progress Notes (Signed)
Internal Medicine Clinic Attending  Case discussed with Dr. Zheng  At the time of the visit.  We reviewed the resident's history and exam and pertinent patient test results.  I agree with the assessment, diagnosis, and plan of care documented in the resident's note.  

## 2023-01-20 ENCOUNTER — Encounter: Payer: Medicaid Other | Admitting: Physical Therapy

## 2023-01-28 ENCOUNTER — Other Ambulatory Visit (HOSPITAL_COMMUNITY): Payer: Self-pay

## 2023-01-28 ENCOUNTER — Other Ambulatory Visit (INDEPENDENT_AMBULATORY_CARE_PROVIDER_SITE_OTHER): Payer: Self-pay | Admitting: Internal Medicine

## 2023-01-28 DIAGNOSIS — E1169 Type 2 diabetes mellitus with other specified complication: Secondary | ICD-10-CM

## 2023-01-30 ENCOUNTER — Other Ambulatory Visit (HOSPITAL_COMMUNITY): Payer: Self-pay

## 2023-02-02 ENCOUNTER — Other Ambulatory Visit (HOSPITAL_COMMUNITY): Payer: Self-pay

## 2023-02-02 ENCOUNTER — Encounter (INDEPENDENT_AMBULATORY_CARE_PROVIDER_SITE_OTHER): Payer: Self-pay | Admitting: Physician Assistant

## 2023-02-02 ENCOUNTER — Ambulatory Visit (INDEPENDENT_AMBULATORY_CARE_PROVIDER_SITE_OTHER): Payer: Medicaid Other | Admitting: Physician Assistant

## 2023-02-02 VITALS — BP 118/70 | HR 90 | Temp 98.1°F | Ht 63.0 in | Wt 177.0 lb

## 2023-02-02 DIAGNOSIS — E559 Vitamin D deficiency, unspecified: Secondary | ICD-10-CM

## 2023-02-02 DIAGNOSIS — R202 Paresthesia of skin: Secondary | ICD-10-CM

## 2023-02-02 DIAGNOSIS — Z6831 Body mass index (BMI) 31.0-31.9, adult: Secondary | ICD-10-CM

## 2023-02-02 DIAGNOSIS — E669 Obesity, unspecified: Secondary | ICD-10-CM | POA: Insufficient documentation

## 2023-02-02 DIAGNOSIS — E1169 Type 2 diabetes mellitus with other specified complication: Secondary | ICD-10-CM

## 2023-02-02 DIAGNOSIS — Z6830 Body mass index (BMI) 30.0-30.9, adult: Secondary | ICD-10-CM | POA: Insufficient documentation

## 2023-02-02 DIAGNOSIS — Z7985 Long-term (current) use of injectable non-insulin antidiabetic drugs: Secondary | ICD-10-CM

## 2023-02-02 MED ORDER — VITAMIN D (ERGOCALCIFEROL) 1.25 MG (50000 UNIT) PO CAPS
50000.0000 [IU] | ORAL_CAPSULE | ORAL | 0 refills | Status: DC
Start: 2023-02-02 — End: 2023-03-17
  Filled 2023-02-02: qty 12, 84d supply, fill #0

## 2023-02-02 MED ORDER — SEMAGLUTIDE (2 MG/DOSE) 8 MG/3ML ~~LOC~~ SOPN
2.0000 mg | PEN_INJECTOR | SUBCUTANEOUS | 0 refills | Status: DC
Start: 2023-02-02 — End: 2023-03-02
  Filled 2023-02-02: qty 3, 28d supply, fill #0

## 2023-02-02 NOTE — Progress Notes (Signed)
.[facility]  Office: [phone]  /  Fax: [phone]  WEIGHT SUMMARY AND BIOMETRICS  Vitals Temp: 98.1 F (36.7 C) BP: 118/70 Pulse Rate: 90 SpO2: 99 %   Anthropometric Measurements Height: 5\' 3"  (1.6 m) Weight: 177 lb (80.3 kg) BMI (Calculated): 31.36 Weight at Last Visit: 179 lb Weight Lost Since Last Visit: 2 lb Weight Gained Since Last Visit: 0 lb Starting Weight: 195 lb Total Weight Loss (lbs): 17 lb (7.711 kg) Peak Weight: 207 lb   Body Composition  Body Fat %: 45.3 % Fat Mass (lbs): 80.4 lbs Muscle Mass (lbs): 92 lbs Total Body Water (lbs): 66.2 lbs   Other Clinical Data Fasting: No Labs: No Today's Visit #: 19 Starting Date: 11/22/22     HPI  Chief Complaint: OBESITY  Meghan Wilson is here to discuss her progress with her obesity treatment plan. She is on the the Category 2 Plan and states she is following her eating plan approximately 70 % of the time. She states she is exercising 60 minutes 3 times per week.   Interval History:  Since last office visit she down 2 lbs  Hunger/appetite-well controlled overall, except when was out of Ozempic a couple of weeks ago.  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  Exercise-60 mins 3 times weekly- [facility] [place], walking track x 1 mile.  Hydration-Adequate .   She reports feeling generally well, albeit a bit tired due to recent catering events. The patient notes a significant increase in hunger after failing to refill her Ozempic prescription, which she has since rectified. She denies any side effects from her vitamin D supplementation.  Pharmacotherapy: Ozempic 2 mg weekly for Type 2 diabetes. Denies mass in neck, dysphagia, dyspepsia, persistent hoarseness, abdominal pain, or N/V/Constipation or diarrhea. Has annual eye exam. Mood is  stable.   Will plan to recheck fasting labs at next visit.   TREATMENT PLAN FOR OBESITY:  Recommended Dietary Goals  Meghan Wilson is currently in the action stage of change. As such, her goal is to continue weight management plan. She has agreed to keeping a food journal and adhering to recommended goals of 1100 calories and 90 grams of protein.  Behavioral Intervention  We discussed the following Behavioral Modification Strategies today: increasing lean protein intake, decreasing simple carbohydrates , increasing vegetables, increasing lower glycemic fruits, avoiding skipping meals, work on tracking and journaling calories using tracking application, continue to practice mindfulness when eating, and planning for success.  Additional resources provided today: NA  Recommended Physical Activity Goals  Meghan Wilson has been advised to work up to 150 minutes of moderate intensity aerobic activity a week and strengthening exercises 2-3 times per week for cardiovascular health, weight loss maintenance and preservation of muscle mass.   She has agreed to Continue current level of physical activity    Pharmacotherapy We discussed various medication options to help Meghan Wilson with her weight loss efforts and we both agreed to continue Ozempic 2 mg weekly for Type 2 diabetes.    Return in about 4 weeks (around 03/02/2023).Marland Kitchen She was informed of the importance of frequent follow up visits to maximize her success with intensive lifestyle modifications for her multiple health conditions.  PHYSICAL EXAM:  Blood pressure 118/70, pulse 90, temperature 98.1 F (36.7 C), height 5\' 3"  (1.6 m), weight 177 lb (80.3 kg), SpO2 99 %. Body mass index  is 31.35 kg/m.  General: She is overweight, cooperative, alert, well developed, and in no acute distress. PSYCH: Has normal mood, affect and thought process.   Cardiovascular: HR 90's regular Lungs: Normal breathing effort, no conversational dyspnea. Neuro: no focal  deficits  DIAGNOSTIC DATA REVIEWED:  BMET    Component Value Date/Time   NA 141 09/04/2022 1027   K 4.3 09/04/2022 1027   CL 104 09/04/2022 1027   CO2 24 09/04/2022 1027   GLUCOSE 93 09/04/2022 1027   GLUCOSE 100 (H) 07/06/2022 0944   BUN 7 (L) 09/04/2022 1027   CREATININE 0.53 (L) 09/04/2022 1027   CALCIUM 9.3 09/04/2022 1027   GFRNONAA >60 07/06/2022 0944   GFRAA 119 05/11/2020 0935   Lab Results  Component Value Date   HGBA1C 5.9 (H) 11/13/2022   HGBA1C 6.5 (H) 04/02/2018   Lab Results  Component Value Date   INSULIN 5.8 11/21/2021   Lab Results  Component Value Date   TSH 0.976 11/21/2021   CBC    Component Value Date/Time   WBC 6.5 11/19/2022 0933   WBC 7.3 07/06/2022 0944   RBC 4.77 11/19/2022 0933   HGB 12.8 11/19/2022 0933   HGB 11.9 06/25/2022 1420   HCT 40.9 11/19/2022 0933   HCT 38.5 06/25/2022 1420   PLT 341 11/19/2022 0933   PLT 398 06/25/2022 1420   MCV 85.7 11/19/2022 0933   MCV 81 06/25/2022 1420   MCH 26.8 11/19/2022 0933   MCHC 31.3 11/19/2022 0933   RDW 16.3 (H) 11/19/2022 0933   RDW 13.5 06/25/2022 1420   Iron Studies    Component Value Date/Time   IRON 69 11/19/2022 0933   IRON 24 (L) 06/25/2022 1420   TIBC 365 11/19/2022 0933   TIBC 351 06/25/2022 1420   FERRITIN 180 11/19/2022 0933   FERRITIN 81 06/25/2022 1420   IRONPCTSAT 19 11/19/2022 0933   IRONPCTSAT 7 (LL) 06/25/2022 1420   Lipid Panel     Component Value Date/Time   CHOL 144 09/04/2022 1027   TRIG 52 09/04/2022 1027   HDL 70 09/04/2022 1027   CHOLHDL 2.7 05/02/2021 1158   CHOLHDL 2.9 04/02/2018 1115   VLDL 51 (H) 04/02/2018 1115   LDLCALC 63 09/04/2022 1027   Hepatic Function Panel     Component Value Date/Time   PROT 6.6 09/04/2022 1027   ALBUMIN 4.3 09/04/2022 1027   AST 19 09/04/2022 1027   ALT 19 09/04/2022 1027   ALKPHOS 119 09/04/2022 1027   BILITOT <0.2 09/04/2022 1027      Component Value Date/Time   TSH 0.976 11/21/2021 1007    Nutritional Lab Results  Component Value Date   VD25OH 54.3 09/04/2022   VD25OH 34.2 11/21/2021    ASSOCIATED CONDITIONS ADDRESSED TODAY  ASSESSMENT AND PLAN  Problem List Items Addressed This Visit     Type II diabetes mellitus (HCC)   Relevant Medications   Semaglutide, 2 MG/DOSE, 8 MG/3ML SOPN   Vitamin D deficiency - Primary   Relevant Medications   Vitamin D, Ergocalciferol, (DRISDOL) 1.25 MG (50000 UNIT) CAPS capsule   Obesity: Initial BMI 35   Relevant Medications   Semaglutide, 2 MG/DOSE, 8 MG/3ML SOPN   BMI 31.0-31.9,adult Current BMI 31.4   Other Visit Diagnoses     Paresthesia of lower extremity         Type 2 Diabetes Mellitus with other specified complication, without long-term current use of insulin HgbA1c is at goal. Last A1c was 5.9 CBGs: Fasting 80-89  PP CBG- 130's Episodes of hypoglycemia: no Medication(s): Ozempic 2 mg SQ weekly Obesity and Type 2 Diabetes: Successful weight loss and good glycemic control. Noted increased hunger when Ozempic was not refilled. She is working on nutrition plan to decrease simple carbohydrates, increase lean proteins and exercise to promote weight loss and improve glycemic control.  Lab Results  Component Value Date   HGBA1C 5.9 (H) 11/13/2022   HGBA1C 6.4 (H) 09/04/2022   HGBA1C 6.0 (A) 04/07/2022   Lab Results  Component Value Date   LDLCALC 63 09/04/2022   CREATININE 0.53 (L) 09/04/2022   Lab Results  Component Value Date   GFR 99.29 11/09/2020    Plan: Continue and refill Ozempic 2 mg SQ weekly Continue working on nutrition plan to decrease simple carbohydrates, increase lean proteins and exercise to promote weight loss and improve glycemic control . Recheck fasting labs next visit.   Vitamin D Deficiency Vitamin D is at goal of 50.  Most recent vitamin D level was 54.3. She is on  prescription ergocalciferol 50,000 IU weekly but reports now taking every other week.  Lab Results  Component Value  Date   VD25OH 54.3 09/04/2022   VD25OH 34.2 11/21/2021    Plan: Continue and refill  prescription ergocalciferol 50,000 IU weekly She has been taking every other week.  Low vitamin D levels can be associated with adiposity and may result in leptin resistance and weight gain. Also associated with fatigue. Currently on vitamin D supplementation without any adverse effects.  Plan to recheck vitamin D level next visit with other labs.   Paresthesia of lower extremities: Reports of tingling sensation in legs, possibly related to recent surgeries( total knee replacement and removal of hardware from right lower leg) , has known back issues (CT scan of lumbar spine 03/18/2022-  IMPRESSION: 1. Mild lumbar spondylosis and degenerative disc disease. There is mild left foraminal stenosis at L5-S1 due to asymmetric facet arthropathy. 2.  Aortic Atherosclerosis (ICD10-I70.0). 3. Suspected bony demineralization.  No fracture observed. No pain or functional impairment of lower extremities reported. No new bladder or bowel dysfunction. No falls or history of trauma. No fevers or other signs of infection.  Plan: Discussed this could be multifactorial related to previous surgeries, possibly due to DDD of spine as well as diabetes.  Discussed follow up with orthopedics for further evaluation. Discussed tight management of diabetes as she has been doing with nutrition plan, exercise and Ozempic.  Will also check Vitamin B12 level at next lab visit to rule out deficiency as source of paresthesias .   ATTESTASTION STATEMENTS:  Reviewed by clinician on day of visit: allergies, medications, problem list, medical history, surgical history, family history, social history, and previous encounter notes.   I have personally spent 46 minutes total time today in preparation, patient care, nutritional counseling and documentation for this visit, including the following: review of clinical lab tests; review of medical  tests/procedures/services.      Meric Joye, PA-C

## 2023-02-03 ENCOUNTER — Other Ambulatory Visit (HOSPITAL_COMMUNITY): Payer: Self-pay

## 2023-03-02 ENCOUNTER — Ambulatory Visit (INDEPENDENT_AMBULATORY_CARE_PROVIDER_SITE_OTHER): Payer: MEDICAID | Admitting: Internal Medicine

## 2023-03-02 ENCOUNTER — Encounter (INDEPENDENT_AMBULATORY_CARE_PROVIDER_SITE_OTHER): Payer: Self-pay | Admitting: Internal Medicine

## 2023-03-02 ENCOUNTER — Encounter: Payer: Self-pay | Admitting: Nurse Practitioner

## 2023-03-02 ENCOUNTER — Other Ambulatory Visit (HOSPITAL_COMMUNITY): Payer: Self-pay

## 2023-03-02 ENCOUNTER — Encounter (HOSPITAL_COMMUNITY): Payer: Self-pay

## 2023-03-02 VITALS — BP 103/69 | HR 84 | Temp 98.0°F | Ht 63.0 in | Wt 177.0 lb

## 2023-03-02 DIAGNOSIS — E559 Vitamin D deficiency, unspecified: Secondary | ICD-10-CM | POA: Diagnosis not present

## 2023-03-02 DIAGNOSIS — E1169 Type 2 diabetes mellitus with other specified complication: Secondary | ICD-10-CM | POA: Diagnosis not present

## 2023-03-02 DIAGNOSIS — Z7985 Long-term (current) use of injectable non-insulin antidiabetic drugs: Secondary | ICD-10-CM

## 2023-03-02 DIAGNOSIS — Z6831 Body mass index (BMI) 31.0-31.9, adult: Secondary | ICD-10-CM | POA: Diagnosis not present

## 2023-03-02 DIAGNOSIS — Z7984 Long term (current) use of oral hypoglycemic drugs: Secondary | ICD-10-CM

## 2023-03-02 MED ORDER — METFORMIN HCL ER 500 MG PO TB24
500.0000 mg | ORAL_TABLET | Freq: Every day | ORAL | 0 refills | Status: DC
Start: 2023-03-02 — End: 2023-03-23
  Filled 2023-03-02: qty 30, 30d supply, fill #0

## 2023-03-02 MED ORDER — SEMAGLUTIDE (2 MG/DOSE) 8 MG/3ML ~~LOC~~ SOPN
2.0000 mg | PEN_INJECTOR | SUBCUTANEOUS | 0 refills | Status: DC
Start: 2023-03-02 — End: 2023-03-23
  Filled 2023-03-02: qty 3, 28d supply, fill #0

## 2023-03-02 NOTE — Assessment & Plan Note (Signed)
Most recent vitamin D levels  Lab Results  Component Value Date   VD25OH 54.3 09/04/2022   VD25OH 34.2 11/21/2021     Deficiency state associated with adiposity and may result in leptin resistance, weight gain and fatigue. Currently on vitamin D supplementation without any adverse effects.  Plan: Check vitamin D levels today if levels are therapeutic she will be switched to over-the-counter supplementation to reduce the need for monitoring .

## 2023-03-02 NOTE — Progress Notes (Signed)
Office: [phone]  /  Fax: [phone]  WEIGHT SUMMARY AND BIOMETRICS  Vitals Temp: 98 F (36.7 C) BP: 103/69 Pulse Rate: 84 SpO2: 100 %   Anthropometric Measurements Height: 5\' 3"  (1.6 m) Weight: 177 lb (80.3 kg) BMI (Calculated): 31.36 Weight at Last Visit: 177lb Weight Lost Since Last Visit: 0 Weight Gained Since Last Visit: 0 Starting Weight: 195lb Total Weight Loss (lbs): 17 lb (7.711 kg) Peak Weight: 207lb   Body Composition  Body Fat %: 44.2 % Fat Mass (lbs): 78.4 lbs Muscle Mass (lbs): 93.8 lbs Total Body Water (lbs): 63.8 lbs Visceral Fat Rating : 12    No data recorded Today's Visit #: 20  Starting Date: 11/22/22   HPI  Chief Complaint: OBESITY  Meghan Wilson is here to discuss her progress with her obesity treatment plan. She is on the the Category 2 Plan and states she is following her eating plan approximately 70 % of the time. She states she is not exercising for the last 2 weeks due to shoulder pain 0 minutes 0 times per week.  Interval History:  Since last office visit she has maintained weight. She reports fair adherence to reduced calorie nutritional plan. She has been working on not skipping meals, increasing protein intake at every meal, eating more vegetables, and drinking more water  Orixegenic Control: Reports problems with appetite and hunger signals.  Denies problems with satiety and satiation.  Denies problems with eating patterns and portion control.  Reports abnormal cravings. Denies feeling deprived or restricted.   Barriers identified: strong hunger signals and appetite, having difficulty focusing on healthy eating, and exposure to enticing environments and or relationships.   Pharmacotherapy for weight loss: She is currently taking Ozempic with diabetes as the primary indication with adequate clinical response  and without side effects..    ASSESSMENT AND PLAN  TREATMENT PLAN FOR OBESITY:  Recommended Dietary  Goals  Meghan Wilson is currently in the action stage of change. As such, her goal is to continue weight management plan. She has agreed to: continue current plan  Behavioral Intervention  We discussed the following Behavioral Modification Strategies today: increasing lean protein intake, decreasing simple carbohydrates , increasing vegetables, increasing lower glycemic fruits, increasing fiber rich foods, avoiding skipping meals, increasing water intake, and planning for success.  Additional resources provided today: None  Recommended Physical Activity Goals  [name] has been advised to work up to 150 minutes of moderate intensity aerobic activity a week and strengthening exercises 2-3 times per week for cardiovascular health, weight loss maintenance and preservation of muscle mass.   She has agreed to :  Think about ways to increase daily physical activity and overcoming barriers to exercise  Pharmacotherapy We discussed various medication options to help [name] with her weight loss efforts and we both agreed to :  She will continue on Ozempic at 2 mg once a week with a primary indication of diabetes.  We will start metformin XR 500 mg once a day for enhanced incretin effect  ASSOCIATED CONDITIONS ADDRESSED TODAY  Vitamin D deficiency Assessment & Plan: Most recent vitamin D levels  Lab Results  Component Value Date   VD25OH 54.3 09/04/2022   VD25OH 34.2 11/21/2021     Deficiency state associated with adiposity and may result in leptin resistance, weight gain and fatigue. Currently on vitamin D supplementation without any adverse effects.  Plan: Check vitamin D levels today if levels are therapeutic she will be switched to over-the-counter supplementation to reduce the need for  monitoring .    Type 2 diabetes mellitus with other specified complication, without long-term current use of insulin (HCC) Assessment & Plan: Her A1c is 5.9.  Morning blood sugars have been at goal.  She is  currently on semaglutide 2 mg a week without adverse effects.  She has lost 28 pounds close to 15% of her body weight.  We will add metformin 500 mg XR for enhance and creatinine effect.  She will continue on semaglutide 2 mg a week.  We are checking hemoglobin A1c today.  Orders: -     Semaglutide (2 MG/DOSE); Inject 2 mg as directed once a week.  Dispense: 3 mL; Refill: 0 -     VITAMIN D 25 Hydroxy (Vit-D Deficiency, Fractures) -     Hemoglobin A1c -     Insulin, random -     CMP14+EGFR -     metFORMIN HCl ER; Take 1 tablet (500 mg total) by mouth daily with breakfast.  Dispense: 30 tablet; Refill: 0  Obesity, Current BMI of 31    PHYSICAL EXAM:  Blood pressure 103/69, pulse 84, temperature 98 F (36.7 C), height 5\' 3"  (1.6 m), weight 177 lb (80.3 kg), SpO2 100 %. Body mass index is 31.35 kg/m.  General: She is overweight, cooperative, alert, well developed, and in no acute distress. PSYCH: Has normal mood, affect and thought process.   HEENT: EOMI, sclerae are anicteric. Lungs: Normal breathing effort, no conversational dyspnea. Extremities: No edema.  Neurologic: No gross sensory or motor deficits. No tremors or fasciculations noted.    DIAGNOSTIC DATA REVIEWED:  BMET    Component Value Date/Time   NA 141 09/04/2022 1027   K 4.3 09/04/2022 1027   CL 104 09/04/2022 1027   CO2 24 09/04/2022 1027   GLUCOSE 93 09/04/2022 1027   GLUCOSE 100 (H) 07/06/2022 0944   BUN 7 (L) 09/04/2022 1027   CREATININE 0.53 (L) 09/04/2022 1027   CALCIUM 9.3 09/04/2022 1027   GFRNONAA >60 07/06/2022 0944   GFRAA 119 05/11/2020 0935   Lab Results  Component Value Date   HGBA1C 5.9 (H) 11/13/2022   HGBA1C 6.5 (H) 04/02/2018   Lab Results  Component Value Date   INSULIN 5.8 11/21/2021   Lab Results  Component Value Date   TSH 0.976 11/21/2021   CBC    Component Value Date/Time   WBC 6.5 11/19/2022 0933   WBC 7.3 07/06/2022 0944   RBC 4.77 11/19/2022 0933   HGB 12.8 11/19/2022  0933   HGB 11.9 06/25/2022 1420   HCT 40.9 11/19/2022 0933   HCT 38.5 06/25/2022 1420   PLT 341 11/19/2022 0933   PLT 398 06/25/2022 1420   MCV 85.7 11/19/2022 0933   MCV 81 06/25/2022 1420   MCH 26.8 11/19/2022 0933   MCHC 31.3 11/19/2022 0933   RDW 16.3 (H) 11/19/2022 0933   RDW 13.5 06/25/2022 1420   Iron Studies    Component Value Date/Time   IRON 69 11/19/2022 0933   IRON 24 (L) 06/25/2022 1420   TIBC 365 11/19/2022 0933   TIBC 351 06/25/2022 1420   FERRITIN 180 11/19/2022 0933   FERRITIN 81 06/25/2022 1420   IRONPCTSAT 19 11/19/2022 0933   IRONPCTSAT 7 (LL) 06/25/2022 1420   Lipid Panel     Component Value Date/Time   CHOL 144 09/04/2022 1027   TRIG 52 09/04/2022 1027   HDL 70 09/04/2022 1027   CHOLHDL 2.7 05/02/2021 1158   CHOLHDL 2.9 04/02/2018 1115   VLDL  51 (H) 04/02/2018 1115   LDLCALC 63 09/04/2022 1027   Hepatic Function Panel     Component Value Date/Time   PROT 6.6 09/04/2022 1027   ALBUMIN 4.3 09/04/2022 1027   AST 19 09/04/2022 1027   ALT 19 09/04/2022 1027   ALKPHOS 119 09/04/2022 1027   BILITOT <0.2 09/04/2022 1027      Component Value Date/Time   TSH 0.976 11/21/2021 1007   Nutritional Lab Results  Component Value Date   VD25OH 54.3 09/04/2022   VD25OH 34.2 11/21/2021     Return in about 3 weeks (around 03/23/2023) for For Weight Mangement with Dr. Rikki Spearing.Marland Kitchen She was informed of the importance of frequent follow up visits to maximize her success with intensive lifestyle modifications for her multiple health conditions.   ATTESTASTION STATEMENTS:  Reviewed by clinician on day of visit: allergies, medications, problem list, medical history, surgical history, family history, social history, and previous encounter notes.     Worthy Rancher, MD

## 2023-03-02 NOTE — Assessment & Plan Note (Signed)
Her A1c is 5.9.  Morning blood sugars have been at goal.  She is currently on semaglutide 2 mg a week without adverse effects.  She has lost 28 pounds close to 15% of her body weight.  We will add metformin 500 mg XR for enhance and creatinine effect.  She will continue on semaglutide 2 mg a week.  We are checking hemoglobin A1c today.

## 2023-03-04 ENCOUNTER — Other Ambulatory Visit (HOSPITAL_COMMUNITY): Payer: Self-pay

## 2023-03-04 ENCOUNTER — Encounter: Payer: Self-pay | Admitting: Nurse Practitioner

## 2023-03-08 ENCOUNTER — Other Ambulatory Visit: Payer: Self-pay | Admitting: Nurse Practitioner

## 2023-03-08 DIAGNOSIS — D508 Other iron deficiency anemias: Secondary | ICD-10-CM
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
## 2023-03-11 ENCOUNTER — Encounter: Payer: Self-pay | Admitting: Nurse Practitioner

## 2023-03-11 ENCOUNTER — Inpatient Hospital Stay (HOSPITAL_BASED_OUTPATIENT_CLINIC_OR_DEPARTMENT_OTHER): Payer: MEDICAID | Admitting: Nurse Practitioner

## 2023-03-11 ENCOUNTER — Other Ambulatory Visit: Payer: Self-pay

## 2023-03-11 ENCOUNTER — Telehealth: Payer: Self-pay

## 2023-03-11 ENCOUNTER — Inpatient Hospital Stay: Payer: MEDICAID | Attending: Hematology

## 2023-03-11 VITALS — BP 129/80 | HR 85 | Temp 98.2°F | Resp 17 | Wt 182.8 lb

## 2023-03-11 DIAGNOSIS — R519 Headache, unspecified: Secondary | ICD-10-CM | POA: Insufficient documentation

## 2023-03-11 DIAGNOSIS — D508 Other iron deficiency anemias: Secondary | ICD-10-CM

## 2023-03-11 DIAGNOSIS — Z90710 Acquired absence of both cervix and uterus: Secondary | ICD-10-CM | POA: Diagnosis not present

## 2023-03-11 DIAGNOSIS — E119 Type 2 diabetes mellitus without complications: Secondary | ICD-10-CM | POA: Diagnosis not present

## 2023-03-11 DIAGNOSIS — D509 Iron deficiency anemia, unspecified: Secondary | ICD-10-CM | POA: Diagnosis present

## 2023-03-11 DIAGNOSIS — Z96642 Presence of left artificial hip joint: Secondary | ICD-10-CM | POA: Insufficient documentation

## 2023-03-11 DIAGNOSIS — R5383 Other fatigue: Secondary | ICD-10-CM | POA: Diagnosis not present

## 2023-03-11 DIAGNOSIS — R42 Dizziness and giddiness: Secondary | ICD-10-CM

## 2023-03-11 DIAGNOSIS — D649 Anemia, unspecified: Secondary | ICD-10-CM

## 2023-03-11 DIAGNOSIS — H9319 Tinnitus, unspecified ear: Secondary | ICD-10-CM | POA: Diagnosis not present

## 2023-03-11 DIAGNOSIS — I10 Essential (primary) hypertension: Secondary | ICD-10-CM | POA: Insufficient documentation

## 2023-03-11 LAB — CBC WITH DIFFERENTIAL (CANCER CENTER ONLY)
Abs Immature Granulocytes: 0.02 10*3/uL (ref 0.00–0.07)
Basophils Absolute: 0 10*3/uL (ref 0.0–0.1)
Basophils Relative: 1 %
Eosinophils Absolute: 0 10*3/uL (ref 0.0–0.5)
Eosinophils Relative: 1 %
HCT: 39.1 % (ref 36.0–46.0)
Hemoglobin: 12.9 g/dL (ref 12.0–15.0)
Immature Granulocytes: 0 %
Lymphocytes Relative: 32 %
Lymphs Abs: 2.4 10*3/uL (ref 0.7–4.0)
MCH: 28.5 pg (ref 26.0–34.0)
MCHC: 33 g/dL (ref 30.0–36.0)
MCV: 86.3 fL (ref 80.0–100.0)
Monocytes Absolute: 0.7 10*3/uL (ref 0.1–1.0)
Monocytes Relative: 9 %
Neutro Abs: 4.3 10*3/uL (ref 1.7–7.7)
Neutrophils Relative %: 57 %
Platelet Count: 304 10*3/uL (ref 150–400)
RBC: 4.53 MIL/uL (ref 3.87–5.11)
RDW: 14.6 % (ref 11.5–15.5)
WBC Count: 7.4 10*3/uL (ref 4.0–10.5)
nRBC: 0 % (ref 0.0–0.2)

## 2023-03-11 LAB — IRON AND IRON BINDING CAPACITY (CC-WL,HP ONLY)
Iron: 98 ug/dL (ref 28–170)
Saturation Ratios: 27 % (ref 10.4–31.8)
TIBC: 360 ug/dL (ref 250–450)
UIBC: 262 ug/dL (ref 148–442)

## 2023-03-11 LAB — FERRITIN: Ferritin: 195 ng/mL (ref 11–307)

## 2023-03-11 NOTE — Telephone Encounter (Signed)
-----   Message from Pincus Sanes, MD sent at 03/11/2023  1:31 PM EDT ----- Looks like she was referred to me by oncology and my name was placed in the banner.  .  Please call her and let her know I am not accepting new patients, but can get her connected with Jaye Beagle or Beather Arbour.  Please remove my name from the [facility] ----- Message ----- From: Verlee Rossetti, CMA Sent: 03/11/2023  10:38 AM EDT To: Pincus Sanes, MD  New patient referral please call patient to set up appointment.

## 2023-03-12 LAB — CMP14+EGFR
ALT: 26 IU/L (ref 0–32)
AST: 16 IU/L (ref 0–40)
Albumin: 4.4 g/dL (ref 3.9–4.9)
Alkaline Phosphatase: 100 IU/L (ref 44–121)
BUN/Creatinine Ratio: 16 (ref 12–28)
BUN: 9 mg/dL (ref 8–27)
Bilirubin Total: 0.3 mg/dL (ref 0.0–1.2)
CO2: 24 mmol/L (ref 20–29)
Calcium: 9.6 mg/dL (ref 8.7–10.3)
Chloride: 100 mmol/L (ref 96–106)
Creatinine, Ser: 0.57 mg/dL (ref 0.57–1.00)
Globulin, Total: 2.1 g/dL (ref 1.5–4.5)
Glucose: 83 mg/dL (ref 70–99)
Potassium: 4.6 mmol/L (ref 3.5–5.2)
Sodium: 138 mmol/L (ref 134–144)
Total Protein: 6.5 g/dL (ref 6.0–8.5)
eGFR: 102 mL/min/{1.73_m2} (ref >59)

## 2023-03-12 LAB — INSULIN, RANDOM: INSULIN: 6.1 u[IU]/mL (ref 2.6–24.9)

## 2023-03-12 LAB — HEMOGLOBIN A1C
Est. average glucose Bld gHb Est-mCnc: 128 mg/dL
Hgb A1c MFr Bld: 6.1 % — ABNORMAL HIGH (ref 4.8–5.6)

## 2023-03-12 LAB — VITAMIN D 25 HYDROXY (VIT D DEFICIENCY, FRACTURES): Vit D, 25-Hydroxy: 78.3 ng/mL (ref 30.0–100.0)

## 2023-03-13 ENCOUNTER — Telehealth: Payer: Self-pay

## 2023-03-13 NOTE — Telephone Encounter (Addendum)
Called patietn relayed message below as per Meghan Glad NP. Patient voiced full understanding.    ----- Message from [name] sent at 03/12/2023  7:56 PM EDT ----- Please let Meghan Wilson know ferritin is normal, other labs are good as we discussed. She can remain off oral iron. Keep same schedule for now, but ok to cancel if she establishes with new PCP who will monitor IDA by then. Have her let us know please.   Thanks, Clayborn Heron NP

## 2023-03-17 ENCOUNTER — Other Ambulatory Visit (INDEPENDENT_AMBULATORY_CARE_PROVIDER_SITE_OTHER): Payer: Self-pay | Admitting: Internal Medicine

## 2023-03-17 ENCOUNTER — Other Ambulatory Visit (HOSPITAL_COMMUNITY): Payer: Self-pay

## 2023-03-23 ENCOUNTER — Encounter: Payer: Self-pay | Admitting: Nurse Practitioner

## 2023-03-23 ENCOUNTER — Encounter (INDEPENDENT_AMBULATORY_CARE_PROVIDER_SITE_OTHER): Payer: Self-pay | Admitting: Internal Medicine

## 2023-03-23 ENCOUNTER — Other Ambulatory Visit: Payer: Self-pay

## 2023-03-23 ENCOUNTER — Ambulatory Visit (INDEPENDENT_AMBULATORY_CARE_PROVIDER_SITE_OTHER): Payer: MEDICAID | Admitting: Internal Medicine

## 2023-03-23 ENCOUNTER — Other Ambulatory Visit (HOSPITAL_COMMUNITY): Payer: Self-pay

## 2023-03-23 VITALS — BP 128/78 | Temp 98.1°F | Ht 63.0 in | Wt 178.0 lb

## 2023-03-23 DIAGNOSIS — E559 Vitamin D deficiency, unspecified: Secondary | ICD-10-CM | POA: Diagnosis not present

## 2023-03-23 DIAGNOSIS — Z7984 Long term (current) use of oral hypoglycemic drugs: Secondary | ICD-10-CM

## 2023-03-23 DIAGNOSIS — Z6831 Body mass index (BMI) 31.0-31.9, adult: Secondary | ICD-10-CM | POA: Diagnosis not present

## 2023-03-23 DIAGNOSIS — E1169 Type 2 diabetes mellitus with other specified complication: Secondary | ICD-10-CM | POA: Diagnosis not present

## 2023-03-23 DIAGNOSIS — E669 Obesity, unspecified: Secondary | ICD-10-CM | POA: Diagnosis not present

## 2023-03-23 DIAGNOSIS — Z7985 Long-term (current) use of injectable non-insulin antidiabetic drugs: Secondary | ICD-10-CM

## 2023-03-23 MED ORDER — VITAMIN D3 50 MCG (2000 UT) PO CAPS
2000.0000 [IU] | ORAL_CAPSULE | Freq: Every day | ORAL | Status: DC
Start: 2023-03-23 — End: 2023-06-18

## 2023-03-23 MED ORDER — METFORMIN HCL ER 500 MG PO TB24
500.0000 mg | ORAL_TABLET | Freq: Every day | ORAL | 0 refills | Status: DC
Start: 2023-03-23 — End: 2023-04-16
  Filled 2023-03-23: qty 30, 30d supply, fill #0

## 2023-03-23 MED ORDER — SEMAGLUTIDE (2 MG/DOSE) 8 MG/3ML ~~LOC~~ SOPN
2.0000 mg | PEN_INJECTOR | SUBCUTANEOUS | 0 refills | Status: DC
Start: 2023-03-23 — End: 2023-04-16
  Filled 2023-03-23: qty 3, 28d supply, fill #0

## 2023-03-23 NOTE — Assessment & Plan Note (Signed)
Her vitamin D levels are now 73 we recommend she discontinue high-dose supplementation and start vitamin D3 2000 international units daily over-the-counter.

## 2023-03-23 NOTE — Assessment & Plan Note (Signed)
Her A1c is 6.1 and improved from 6.5.  She is currently on semaglutide 2 mg a week without adverse effects.  She has lost 28 pounds close to 15% of her body weight.  She will continue metformin 500 mg XR once daily and semaglutide 2 mg

## 2023-03-23 NOTE — Progress Notes (Signed)
Office: [phone]  /  Fax: [phone]  WEIGHT SUMMARY AND BIOMETRICS  Vitals Temp: 98.1 F (36.7 C) BP: 128/78   Anthropometric Measurements Height: 5\' 3"  (1.6 m) Weight: 178 lb (80.7 kg) BMI (Calculated): 31.54 Weight at Last Visit: 177 lb Weight Lost Since Last Visit: 0 lb Weight Gained Since Last Visit: 1 lb Starting Weight: 195 lb Total Weight Loss (lbs): 16 lb (7.258 kg) Peak Weight: 207 lb   Body Composition  Body Fat %: 44.4 % Fat Mass (lbs): 79.2 lbs Muscle Mass (lbs): 94 lbs Total Body Water (lbs): 66 lbs Visceral Fat Rating : 12    No data recorded Today's Visit #: 21  Starting Date: 11/22/22   HPI  Chief Complaint: OBESITY  Alazne is here to discuss her progress with her obesity treatment plan. She is on the the Category 2 Plan and states she is following her eating plan approximately 80 % of the time. She states she is not exercising.  Interval History:  Since last office visit she has gained 1 lb. PA has declined. Had skipped some meals. She reports good adherence to reduced calorie nutritional plan. She has been working on increasing protein intake at every meal, eating more vegetables, drinking more water, avoiding and / or reducing liquid calories, making healthier choices, and working on meal prepping  Orixegenic Control: Denies problems with appetite and hunger signals.  Denies problems with satiety and satiation.  Denies problems with eating patterns and portion control.  Denies abnormal cravings. Denies feeling deprived or restricted.   Barriers identified: presence of obesogenic drugs.   Pharmacotherapy for weight loss: She is currently taking Metformin (off label use for incretin effect and / or insulin resistance and / or diabetes prevention) with adequate clinical response  and without side effects. and Ozempic with diabetes as the primary indication with adequate clinical response  and without side effects..    ASSESSMENT  AND PLAN  TREATMENT PLAN FOR OBESITY:  Recommended Dietary Goals  Laelah is currently in the action stage of change. As such, her goal is to continue weight management plan. She has agreed to: continue current plan  Behavioral Intervention  We discussed the following Behavioral Modification Strategies today: increasing lean protein intake, decreasing simple carbohydrates , increasing vegetables, increasing lower glycemic fruits, increasing fiber rich foods, increasing water intake, continue to practice mindfulness when eating, and planning for success.  Additional resources provided today: None  Recommended Physical Activity Goals  Bernadetta has been advised to work up to 150 minutes of moderate intensity aerobic activity a week and strengthening exercises 2-3 times per week for cardiovascular health, weight loss maintenance and preservation of muscle mass.   She has agreed to :  We discussed chair exercise.   Pharmacotherapy We discussed various medication options to help Livianna with her weight loss efforts and we both agreed to : continue current anti-obesity medication regimen  ASSOCIATED CONDITIONS ADDRESSED TODAY  Obesity, Current BMI of 31  Type 2 diabetes mellitus with other specified complication, without long-term current use of insulin (HCC) Assessment & Plan: Her A1c is 6.1 and improved from 6.5.  She is currently on semaglutide 2 mg a week without adverse effects.  She has lost 28 pounds close to 15% of her body weight.  She will continue metformin 500 mg XR once daily and semaglutide 2 mg  Orders: -     metFORMIN HCl ER; Take 1 tablet (500 mg total) by mouth daily with breakfast.  Dispense: 30 tablet;  Refill: 0 -     Semaglutide (2 MG/DOSE); Inject 2 mg as directed once a week.  Dispense: 3 mL; Refill: 0  Vitamin D deficiency Assessment & Plan: Her vitamin D levels are now 29 we recommend she discontinue high-dose supplementation and start vitamin D3 2000  international units daily over-the-counter.  Orders: -     Vitamin D3; Take 1 capsule (2,000 Units total) by mouth daily.    PHYSICAL EXAM:  Blood pressure 128/78, temperature 98.1 F (36.7 C), height 5\' 3"  (1.6 m), weight 178 lb (80.7 kg). Body mass index is 31.53 kg/m.  General: She is overweight, cooperative, alert, well developed, and in no acute distress. PSYCH: Has normal mood, affect and thought process.   HEENT: EOMI, sclerae are anicteric. Lungs: Normal breathing effort, no conversational dyspnea. Extremities: No edema.  Neurologic: No gross sensory or motor deficits. No tremors or fasciculations noted.    DIAGNOSTIC DATA REVIEWED:  BMET    Component Value Date/Time   NA 138 03/11/2023 0845   K 4.6 03/11/2023 0845   CL 100 03/11/2023 0845   CO2 24 03/11/2023 0845   GLUCOSE 83 03/11/2023 0845   GLUCOSE 100 (H) 07/06/2022 0944   BUN 9 03/11/2023 0845   CREATININE 0.57 03/11/2023 0845   CALCIUM 9.6 03/11/2023 0845   GFRNONAA >60 07/06/2022 0944   GFRAA 119 05/11/2020 0935   Lab Results  Component Value Date   HGBA1C 6.1 (H) 03/11/2023   HGBA1C 6.5 (H) 04/02/2018   Lab Results  Component Value Date   INSULIN 6.1 03/11/2023   INSULIN 5.8 11/21/2021   Lab Results  Component Value Date   TSH 0.976 11/21/2021   CBC    Component Value Date/Time   WBC 7.4 03/11/2023 0915   WBC 7.3 07/06/2022 0944   RBC 4.53 03/11/2023 0915   HGB 12.9 03/11/2023 0915   HGB 11.9 06/25/2022 1420   HCT 39.1 03/11/2023 0915   HCT 38.5 06/25/2022 1420   PLT 304 03/11/2023 0915   PLT 398 06/25/2022 1420   MCV 86.3 03/11/2023 0915   MCV 81 06/25/2022 1420   MCH 28.5 03/11/2023 0915   MCHC 33.0 03/11/2023 0915   RDW 14.6 03/11/2023 0915   RDW 13.5 06/25/2022 1420   Iron Studies    Component Value Date/Time   IRON 98 03/11/2023 0915   IRON 24 (L) 06/25/2022 1420   TIBC 360 03/11/2023 0915   TIBC 351 06/25/2022 1420   FERRITIN 195 03/11/2023 0916   FERRITIN 81  06/25/2022 1420   IRONPCTSAT 27 03/11/2023 0915   IRONPCTSAT 7 (LL) 06/25/2022 1420   Lipid Panel     Component Value Date/Time   CHOL 144 09/04/2022 1027   TRIG 52 09/04/2022 1027   HDL 70 09/04/2022 1027   CHOLHDL 2.7 05/02/2021 1158   CHOLHDL 2.9 04/02/2018 1115   VLDL 51 (H) 04/02/2018 1115   LDLCALC 63 09/04/2022 1027   Hepatic Function Panel     Component Value Date/Time   PROT 6.5 03/11/2023 0845   ALBUMIN 4.4 03/11/2023 0845   AST 16 03/11/2023 0845   ALT 26 03/11/2023 0845   ALKPHOS 100 03/11/2023 0845   BILITOT 0.3 03/11/2023 0845      Component Value Date/Time   TSH 0.976 11/21/2021 1007   Nutritional Lab Results  Component Value Date   VD25OH 78.3 03/11/2023   VD25OH 54.3 09/04/2022   VD25OH 34.2 11/21/2021     Return in about 3 weeks (around 04/13/2023) for For Weight  Mangement with Dr. Rikki Spearing.Marland Kitchen She was informed of the importance of frequent follow up visits to maximize her success with intensive lifestyle modifications for her multiple health conditions.   ATTESTASTION STATEMENTS:  Reviewed by clinician on day of visit: allergies, medications, problem list, medical history, surgical history, family history, social history, and previous encounter notes.     Worthy Rancher, MD

## 2023-03-31 ENCOUNTER — Other Ambulatory Visit (HOSPITAL_COMMUNITY): Payer: Self-pay

## 2023-04-16 ENCOUNTER — Encounter: Payer: Self-pay | Admitting: Nurse Practitioner

## 2023-04-16 ENCOUNTER — Ambulatory Visit (INDEPENDENT_AMBULATORY_CARE_PROVIDER_SITE_OTHER): Payer: MEDICAID | Admitting: Internal Medicine

## 2023-04-16 ENCOUNTER — Encounter (INDEPENDENT_AMBULATORY_CARE_PROVIDER_SITE_OTHER): Payer: Self-pay | Admitting: Internal Medicine

## 2023-04-16 ENCOUNTER — Other Ambulatory Visit (HOSPITAL_COMMUNITY): Payer: Self-pay

## 2023-04-16 VITALS — BP 130/89 | HR 94 | Temp 98.3°F | Ht 63.0 in | Wt 174.0 lb

## 2023-04-16 DIAGNOSIS — E1169 Type 2 diabetes mellitus with other specified complication: Secondary | ICD-10-CM

## 2023-04-16 DIAGNOSIS — Z6830 Body mass index (BMI) 30.0-30.9, adult: Secondary | ICD-10-CM

## 2023-04-16 DIAGNOSIS — Z7984 Long term (current) use of oral hypoglycemic drugs: Secondary | ICD-10-CM

## 2023-04-16 DIAGNOSIS — E669 Obesity, unspecified: Secondary | ICD-10-CM | POA: Diagnosis not present

## 2023-04-16 DIAGNOSIS — Z7985 Long-term (current) use of injectable non-insulin antidiabetic drugs: Secondary | ICD-10-CM

## 2023-04-16 MED ORDER — SEMAGLUTIDE (2 MG/DOSE) 8 MG/3ML ~~LOC~~ SOPN
2.0000 mg | PEN_INJECTOR | SUBCUTANEOUS | 0 refills | Status: DC
Start: 2023-04-16 — End: 2023-05-14
  Filled 2023-04-16 – 2023-04-27 (×2): qty 3, 28d supply, fill #0

## 2023-04-16 MED ORDER — METFORMIN HCL ER 500 MG PO TB24
500.0000 mg | ORAL_TABLET | Freq: Every day | ORAL | 0 refills | Status: DC
Start: 2023-04-16 — End: 2023-06-18
  Filled 2023-04-16 – 2023-05-22 (×2): qty 90, 90d supply, fill #0

## 2023-04-16 NOTE — Progress Notes (Signed)
Office: [phone]  /  Fax: [phone]  WEIGHT SUMMARY AND BIOMETRICS  Vitals Temp: 98.3 F (36.8 C) BP: 130/89 Pulse Rate: 94 SpO2: 99 %   Anthropometric Measurements Height: 5\' 3"  (1.6 m) Weight: 174 lb (78.9 kg) BMI (Calculated): 30.83 Weight at Last Visit: 178 lb Weight Lost Since Last Visit: 4 lb Weight Gained Since Last Visit: 0 lb Starting Weight: 195 lb Total Weight Loss (lbs): 21 lb (9.526 kg) Peak Weight: 207 lb   Body Composition  Body Fat %: 42.9 % Fat Mass (lbs): 75 lbs Muscle Mass (lbs): 94.8 lbs Total Body Water (lbs): 61.2 lbs Visceral Fat Rating : 11    No data recorded Today's Visit #: 22  Starting Date: 11/22/22   HPI  Chief Complaint: OBESITY  Meghan Wilson is here to discuss her progress with her obesity treatment plan. She is on the the Category 2 Plan and states she is following her eating plan approximately 70 % of the time. She states she is not exercising.  Interval History:  Since last office visit she has lost 4 pounds. She reports good adherence to reduced calorie nutritional plan. She has been working on not skipping meals, increasing protein intake at every meal, eating more fruits, eating more vegetables, drinking more water, avoiding and / or reducing liquid calories, making healthier choices, reducing portion sizes, and has been eating more salads.  Has not been able to exercise because of orthopedic problems.  Orixegenic Control: Denies problems with appetite and hunger signals.  Denies problems with satiety and satiation.  Denies problems with eating patterns and portion control.  Denies abnormal cravings. Denies feeling deprived or restricted.   Barriers identified: orthopedic problems, medical conditions or chronic pain affecting mobility.   Pharmacotherapy for weight loss: She is currently taking Metformin (off label use for incretin effect and / or insulin resistance and / or diabetes prevention) with adequate  clinical response  and without side effects. and Ozempic with diabetes as the primary indication with adequate clinical response  and without side effects..    ASSESSMENT AND PLAN  TREATMENT PLAN FOR OBESITY:  Recommended Dietary Goals  Hali is currently in the action stage of change. As such, her goal is to continue weight management plan. She has agreed to: continue current plan  Behavioral Intervention  We discussed the following Behavioral Modification Strategies today: increasing lean protein intake, decreasing simple carbohydrates , increasing vegetables, increasing lower glycemic fruits, increasing water intake, continue to practice mindfulness when eating, and planning for success.  Additional resources provided today: None  Recommended Physical Activity Goals  Sammantha has been advised to work up to 150 minutes of moderate intensity aerobic activity a week and strengthening exercises 2-3 times per week for cardiovascular health, weight loss maintenance and preservation of muscle mass.   She has agreed to :  We discussed considering care lower body workouts using YouTube  Pharmacotherapy We discussed various medication options to help Carle with her weight loss efforts and we both agreed to : continue current anti-obesity medication regimen  ASSOCIATED CONDITIONS ADDRESSED TODAY  Obesity, Current BMI of 30 Assessment & Plan: [name] has lost a total of 32 pounds which is close to 16% of total body weight.  Her BIA information shows a reduction in body fat percentage, improvement in visceral fat rating and this time around an increase in muscle mass.  She has done a good job preserving muscle mass.  Unfortunately because of orthopedic problems she has not been able to  go back to the [facility].  We discussed doing chair exercises at home focused on lower extremities.   Type 2 diabetes mellitus with other specified complication, without long-term current use of insulin  (HCC) Assessment & Plan: Her A1c is 6.1 and improved from 6.5.  She is currently on semaglutide 2 mg a week and metformin XR 500 mg daily without adverse effects.  She has reduced simple carbs and has been eating more salads.  She has lost 32 pounds close to 16% of her body weight.  She will continue metformin 500 mg XR once daily and semaglutide 2 mg.  I would like for her to continue metformin as this enhances the effects of incretin therapy.  Orders: -     metFORMIN HCl ER; Take 1 tablet (500 mg total) by mouth daily with breakfast.  Dispense: 90 tablet; Refill: 0 -     Semaglutide (2 MG/DOSE); Inject 2 mg as directed once a week.  Dispense: 3 mL; Refill: 0    PHYSICAL EXAM:  Blood pressure 130/89, pulse 94, temperature 98.3 F (36.8 C), height 5\' 3"  (1.6 m), weight 174 lb (78.9 kg), SpO2 99%. Body mass index is 30.82 kg/m.  General: She is overweight, cooperative, alert, well developed, and in no acute distress. PSYCH: Has normal mood, affect and thought process.   HEENT: EOMI, sclerae are anicteric. Lungs: Normal breathing effort, no conversational dyspnea. Extremities: No edema.  Neurologic: No gross sensory or motor deficits. No tremors or fasciculations noted.    DIAGNOSTIC DATA REVIEWED:  BMET    Component Value Date/Time   NA 138 03/11/2023 0845   K 4.6 03/11/2023 0845   CL 100 03/11/2023 0845   CO2 24 03/11/2023 0845   GLUCOSE 83 03/11/2023 0845   GLUCOSE 100 (H) 07/06/2022 0944   BUN 9 03/11/2023 0845   CREATININE 0.57 03/11/2023 0845   CALCIUM 9.6 03/11/2023 0845   GFRNONAA >60 07/06/2022 0944   GFRAA 119 05/11/2020 0935   Lab Results  Component Value Date   HGBA1C 6.1 (H) 03/11/2023   HGBA1C 6.5 (H) 04/02/2018   Lab Results  Component Value Date   INSULIN 6.1 03/11/2023   INSULIN 5.8 11/21/2021   Lab Results  Component Value Date   TSH 0.976 11/21/2021   CBC    Component Value Date/Time   WBC 7.4 03/11/2023 0915   WBC 7.3 07/06/2022 0944   RBC  4.53 03/11/2023 0915   HGB 12.9 03/11/2023 0915   HGB 11.9 06/25/2022 1420   HCT 39.1 03/11/2023 0915   HCT 38.5 06/25/2022 1420   PLT 304 03/11/2023 0915   PLT 398 06/25/2022 1420   MCV 86.3 03/11/2023 0915   MCV 81 06/25/2022 1420   MCH 28.5 03/11/2023 0915   MCHC 33.0 03/11/2023 0915   RDW 14.6 03/11/2023 0915   RDW 13.5 06/25/2022 1420   Iron Studies    Component Value Date/Time   IRON 98 03/11/2023 0915   IRON 24 (L) 06/25/2022 1420   TIBC 360 03/11/2023 0915   TIBC 351 06/25/2022 1420   FERRITIN 195 03/11/2023 0916   FERRITIN 81 06/25/2022 1420   IRONPCTSAT 27 03/11/2023 0915   IRONPCTSAT 7 (LL) 06/25/2022 1420   Lipid Panel     Component Value Date/Time   CHOL 144 09/04/2022 1027   TRIG 52 09/04/2022 1027   HDL 70 09/04/2022 1027   CHOLHDL 2.7 05/02/2021 1158   CHOLHDL 2.9 04/02/2018 1115   VLDL 51 (H) 04/02/2018 1115   LDLCALC 63  09/04/2022 1027   Hepatic Function Panel     Component Value Date/Time   PROT 6.5 03/11/2023 0845   ALBUMIN 4.4 03/11/2023 0845   AST 16 03/11/2023 0845   ALT 26 03/11/2023 0845   ALKPHOS 100 03/11/2023 0845   BILITOT 0.3 03/11/2023 0845      Component Value Date/Time   TSH 0.976 11/21/2021 1007   Nutritional Lab Results  Component Value Date   VD25OH 78.3 03/11/2023   VD25OH 54.3 09/04/2022   VD25OH 34.2 11/21/2021     Return in about 3 weeks (around 05/07/2023) for For Weight Mangement with Dr. Rikki Spearing.Marland Kitchen She was informed of the importance of frequent follow up visits to maximize her success with intensive lifestyle modifications for her multiple health conditions.   ATTESTASTION STATEMENTS:  Reviewed by clinician on day of visit: allergies, medications, problem list, medical history, surgical history, family history, social history, and previous encounter notes.     Worthy Rancher, MD

## 2023-04-16 NOTE — Assessment & Plan Note (Signed)
Her A1c is 6.1 and improved from 6.5.  She is currently on semaglutide 2 mg a week and metformin XR 500 mg daily without adverse effects.  She has reduced simple carbs and has been eating more salads.  She has lost 32 pounds close to 16% of her body weight.  She will continue metformin 500 mg XR once daily and semaglutide 2 mg.  I would like for her to continue metformin as this enhances the effects of incretin therapy.

## 2023-04-16 NOTE — Assessment & Plan Note (Signed)
Meghan Wilson has lost a total of 32 pounds which is close to 16% of total body weight.  Her BIA information shows a reduction in body fat percentage, improvement in visceral fat rating and this time around an increase in muscle mass.  She has done a good job preserving muscle mass.  Unfortunately because of orthopedic problems she has not been able to go back to the [facility].  We discussed doing chair exercises at home focused on lower extremities.

## 2023-04-27 ENCOUNTER — Encounter: Payer: Self-pay | Admitting: Nurse Practitioner

## 2023-04-27 ENCOUNTER — Encounter (HOSPITAL_COMMUNITY): Payer: Self-pay

## 2023-04-27 ENCOUNTER — Other Ambulatory Visit (HOSPITAL_COMMUNITY): Payer: Self-pay

## 2023-04-28 ENCOUNTER — Other Ambulatory Visit (HOSPITAL_COMMUNITY): Payer: Self-pay

## 2023-04-28 ENCOUNTER — Encounter: Payer: Self-pay | Admitting: Nurse Practitioner

## 2023-05-11 ENCOUNTER — Ambulatory Visit (INDEPENDENT_AMBULATORY_CARE_PROVIDER_SITE_OTHER): Payer: MEDICAID | Admitting: Internal Medicine

## 2023-05-14 ENCOUNTER — Encounter (INDEPENDENT_AMBULATORY_CARE_PROVIDER_SITE_OTHER): Payer: Self-pay | Admitting: Family Medicine

## 2023-05-14 ENCOUNTER — Ambulatory Visit (INDEPENDENT_AMBULATORY_CARE_PROVIDER_SITE_OTHER): Payer: MEDICAID | Admitting: Family Medicine

## 2023-05-14 ENCOUNTER — Other Ambulatory Visit (HOSPITAL_COMMUNITY): Payer: Self-pay

## 2023-05-14 VITALS — BP 132/78 | HR 97 | Temp 98.1°F | Ht 63.0 in | Wt 175.0 lb

## 2023-05-14 DIAGNOSIS — E559 Vitamin D deficiency, unspecified: Secondary | ICD-10-CM

## 2023-05-14 DIAGNOSIS — E1169 Type 2 diabetes mellitus with other specified complication: Secondary | ICD-10-CM | POA: Diagnosis not present

## 2023-05-14 DIAGNOSIS — M25511 Pain in right shoulder: Secondary | ICD-10-CM | POA: Diagnosis not present

## 2023-05-14 DIAGNOSIS — M25512 Pain in left shoulder: Secondary | ICD-10-CM

## 2023-05-14 DIAGNOSIS — G8929 Other chronic pain: Secondary | ICD-10-CM

## 2023-05-14 DIAGNOSIS — E669 Obesity, unspecified: Secondary | ICD-10-CM

## 2023-05-14 DIAGNOSIS — Z7985 Long-term (current) use of injectable non-insulin antidiabetic drugs: Secondary | ICD-10-CM

## 2023-05-14 DIAGNOSIS — Z6831 Body mass index (BMI) 31.0-31.9, adult: Secondary | ICD-10-CM

## 2023-05-14 DIAGNOSIS — Z7984 Long term (current) use of oral hypoglycemic drugs: Secondary | ICD-10-CM

## 2023-05-14 MED ORDER — SEMAGLUTIDE (2 MG/DOSE) 8 MG/3ML ~~LOC~~ SOPN
2.0000 mg | PEN_INJECTOR | SUBCUTANEOUS | 0 refills | Status: DC
  Filled 2023-05-14 – 2023-05-22 (×2): qty 3, 28d supply, fill #0

## 2023-05-14 NOTE — Progress Notes (Signed)
Meghan Wilson, D.O.  ABFM, ABOM Specializing in Clinical Bariatric Medicine  Office located at: [address] [place], [place]  [address]     Assessment and Plan:   Medications Discontinued During This Encounter  Medication Reason   Semaglutide, 2 MG/DOSE, 8 MG/3ML SOPN Reorder     Meds ordered this encounter  Medications   Semaglutide, 2 MG/DOSE, 8 MG/3ML SOPN    Sig: Inject 2 mg as directed once a week.    Dispense:  3 mL    Refill:  0     Type 2 diabetes mellitus with other specified complication, without long-term current use of insulin [facility]) Assessment & Plan: Lab Results  Component Value Date   HGBA1C 6.1 (H) 03/11/2023   HGBA1C 5.9 (H) 11/13/2022   HGBA1C 6.4 (H) 09/04/2022   INSULIN 6.1 03/11/2023   INSULIN 5.8 11/21/2021    Diabetes mellitus treated with Semaglutide 2 mg once a week and Metformin XR 500 mg daily without any adverse effects. Pt feels that her hunger/cravings are well controlled. Will refill Semaglutide today - no change in dose. Continue to decrease simple carbs/ sugars; increase fiber and proteins -> follow her meal plan.     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 She goes to physical therapy twice weekly. Reports taking Volatren, Meloxicam, & Tylenol for pain. She will continue with PT and gradually increase exercise her physical therapist and orthopedist.    Vitamin D deficiency Assessment & Plan: Lab Results  Component Value Date   VD25OH 78.3 03/11/2023   VD25OH 54.3 09/04/2022   VD25OH 34.2 11/21/2021   On 7/22 office visit with Dr.Maldonado, ERGO 50,000 international units once a week was discontinued and she was recommended to start OTC Vitamin D 2,000 units daily. She will c/w supplement at current dose and we recommend getting Vitamin D levels rechecked in 3-4 mos.    BMI 31.0-31.9,adult Current BMI 31.01 Obesity (HCC)-start bmi  35.67 Assessment & Plan: Since last office visit on 04/16/23 patient's muscle mass has decreased by 4 lb. Fat mass has increased by 5 lb. Total body water has increased by 6.2 lb.  Counseling done on how various foods will affect these numbers and how to maximize success  Total lbs lost to date: -20 lbs  Total weight loss percentage to date: 10.26%  No change to meal plan - see Subjective   Behavioral Intervention Additional resources provided today:  Category 2 meal plan  Evidence-based interventions for health behavior change were utilized today including the discussion of self monitoring techniques, problem-solving barriers and SMART goal setting techniques.   Regarding patient's less desirable eating habits and patterns, we employed the technique of small changes.  Pt will specifically work on: going to the gym 3 days a wk and switching proteins around to 6-8 ounces at lunch and 4 ounces at dinner for next visit.    FOLLOW UP: Return 3-4 wks. She was informed of the importance of frequent follow up visits to maximize her success with intensive lifestyle modifications for her multiple health conditions.  Subjective:   Chief complaint: Obesity Meghan Wilson is here to discuss her progress with her obesity treatment plan. She is on the Category 2 Plan with B/L options and states she is following her eating plan approximately 87% of the time. She states she is exercising (walking, stationary bike, & weights) 45 minutes 3 days per week.  Interval History:  Meghan Wilson is here for a follow up office visit. Since last OV, Meghan Wilson has been doing well. Her typical sources of protein are the following: lean hamburger, salmon, chicken breast, eggs, steak, one scoop of protein powder, etc. Reports eating 4 ounces of lean protein at lunch and 4 ounces at night. Endorses that is it difficult to eat 6-8 ounces at dinner because she feels full. Drinks roughly 6 bottles of water daily.   Pharmacotherapy for  weight loss: She is currently taking  Semaglutide 2 mg once a wk and Metformin XR 500 mg daily  for medical weight loss.  Denies side effects.    Review of Systems:  Pertinent positives were addressed with patient today.  Reviewed by clinician on day of visit: allergies, medications, problem list, medical history, surgical history, family history, social history, and previous encounter notes.  Weight Summary and Biometrics   Weight Lost Since Last Visit: 0 lb  Weight Gained Since Last Visit: 1 llb   Vitals Temp: 98.1 F (36.7 C) BP: 132/78 Pulse Rate: 97 SpO2: 98 %   Anthropometric Measurements Height: 5\' 3"  (1.6 m) Weight: 175 lb (79.4 kg) BMI (Calculated): 31.01 Weight at Last Visit: 174 lb Weight Lost Since Last Visit: 0 lb Weight Gained Since Last Visit: 1 llb Starting Weight: 195 lb Total Weight Loss (lbs): 20 lb (9.072 kg) Peak Weight: 207 lb   Body Composition  Body Fat %: 45.6 % Fat Mass (lbs): 80 lbs Muscle Mass (lbs): 90.8 lbs Total Body Water (lbs): 67.4 lbs Visceral Fat Rating : 12   Other Clinical Data Fasting: No Labs: No Today's Visit #: 23 Starting Date: 11/22/22   Objective:   PHYSICAL EXAM: Blood pressure 132/78, pulse 97, temperature 98.1 F (36.7 C), height 5\' 3"  (1.6 m), weight 175 lb (79.4 kg), SpO2 98%. Body mass index is 31 kg/m.  General: Well Developed, well nourished, and in no acute distress.  HEENT: Normocephalic, atraumatic Skin: Warm and dry, cap RF less 2 sec, good turgor Chest:  Normal excursion, shape, no gross abn Respiratory: speaking in full sentences, no conversational dyspnea NeuroM-Sk: Ambulates w/o assistance, moves * 4 Psych: A and O *3, insight good, mood-full  DIAGNOSTIC DATA REVIEWED:  BMET    Component Value Date/Time   NA 138 03/11/2023 0845   K 4.6 03/11/2023 0845   CL 100 03/11/2023 0845   CO2 24 03/11/2023 0845   GLUCOSE 83 03/11/2023 0845   GLUCOSE 100 (H) 07/06/2022 0944   BUN 9 03/11/2023  0845   CREATININE 0.57 03/11/2023 0845   CALCIUM 9.6 03/11/2023 0845   GFRNONAA >60 07/06/2022 0944   GFRAA 119 05/11/2020 0935   Lab Results  Component Value Date   HGBA1C 6.1 (H) 03/11/2023   HGBA1C 6.5 (H) 04/02/2018   Lab Results  Component Value Date   INSULIN 6.1 03/11/2023   INSULIN 5.8 11/21/2021   Lab Results  Component Value Date   TSH 0.976 11/21/2021   CBC    Component Value Date/Time   WBC 7.4 03/11/2023 0915   WBC 7.3 07/06/2022 0944   RBC 4.53 03/11/2023 0915   HGB 12.9 03/11/2023 0915   HGB 11.9 06/25/2022 1420   HCT 39.1 03/11/2023 0915   HCT 38.5 06/25/2022 1420   PLT 304 03/11/2023 0915   PLT 398 06/25/2022 1420   MCV 86.3 03/11/2023 0915   MCV 81 06/25/2022 1420   MCH 28.5 03/11/2023 0915   MCHC 33.0 03/11/2023 0915   RDW 14.6 03/11/2023 0915  RDW 13.5 06/25/2022 1420   Iron Studies    Component Value Date/Time   IRON 98 03/11/2023 0915   IRON 24 (L) 06/25/2022 1420   TIBC 360 03/11/2023 0915   TIBC 351 06/25/2022 1420   FERRITIN 195 03/11/2023 0916   FERRITIN 81 06/25/2022 1420   IRONPCTSAT 27 03/11/2023 0915   IRONPCTSAT 7 (LL) 06/25/2022 1420   Lipid Panel     Component Value Date/Time   CHOL 144 09/04/2022 1027   TRIG 52 09/04/2022 1027   HDL 70 09/04/2022 1027   CHOLHDL 2.7 05/02/2021 1158   CHOLHDL 2.9 04/02/2018 1115   VLDL 51 (H) 04/02/2018 1115   LDLCALC 63 09/04/2022 1027   Hepatic Function Panel     Component Value Date/Time   PROT 6.5 03/11/2023 0845   ALBUMIN 4.4 03/11/2023 0845   AST 16 03/11/2023 0845   ALT 26 03/11/2023 0845   ALKPHOS 100 03/11/2023 0845   BILITOT 0.3 03/11/2023 0845      Component Value Date/Time   TSH 0.976 11/21/2021 1007   Nutritional Lab Results  Component Value Date   VD25OH 78.3 03/11/2023   VD25OH 54.3 09/04/2022   VD25OH 34.2 11/21/2021    Attestations:   I, Meghan Wilson , acting as a Stage manager for [employer], Meghan Wilson., have compiled all relevant documentation for  today's office visit on behalf of Meghan Lot, Meghan Wilson, while in the presence of [employer], Meghan Wilson.  I have reviewed the above documentation for accuracy and completeness, and I agree with the above. Meghan Wilson, D.O.  The 21st Century Cures Act was signed into law in 2016 which includes the topic of electronic health records.  This provides immediate access to information in MyChart.  This includes consultation notes, operative notes, office notes, lab results and pathology reports.  If you have any questions about what you read please let us know at your next visit so we can discuss your concerns and take corrective action if need be.  We are right here with you.

## 2023-05-22 ENCOUNTER — Other Ambulatory Visit: Payer: Self-pay

## 2023-05-22 ENCOUNTER — Other Ambulatory Visit (HOSPITAL_COMMUNITY): Payer: Self-pay

## 2023-05-23 ENCOUNTER — Encounter (HOSPITAL_COMMUNITY): Payer: Self-pay

## 2023-05-23 ENCOUNTER — Emergency Department (HOSPITAL_COMMUNITY): Payer: MEDICAID

## 2023-05-23 ENCOUNTER — Emergency Department (HOSPITAL_COMMUNITY)
Admission: EM | Admit: 2023-05-23 | Discharge: 2023-05-23 | Disposition: A | Discharge status: Home / Self Care | Payer: MEDICAID | Attending: Emergency Medicine | Admitting: Emergency Medicine

## 2023-05-23 DIAGNOSIS — M25511 Pain in right shoulder: Secondary | ICD-10-CM | POA: Diagnosis not present

## 2023-05-23 DIAGNOSIS — S0993XA Unspecified injury of face, initial encounter: Secondary | ICD-10-CM | POA: Diagnosis present

## 2023-05-23 DIAGNOSIS — W01198A Fall on same level from slipping, tripping and stumbling with subsequent striking against other object, initial encounter: Secondary | ICD-10-CM | POA: Insufficient documentation

## 2023-05-23 DIAGNOSIS — Z91040 Latex allergy status: Secondary | ICD-10-CM | POA: Diagnosis not present

## 2023-05-23 DIAGNOSIS — W19XXXA Unspecified fall, initial encounter: Secondary | ICD-10-CM

## 2023-05-23 DIAGNOSIS — S0083XA Contusion of other part of head, initial encounter: Secondary | ICD-10-CM | POA: Diagnosis not present

## 2023-05-23 MED ORDER — ACETAMINOPHEN 500 MG PO TABS
1000.0000 mg | ORAL_TABLET | Freq: Once | ORAL | Status: AC
  Administered 2023-05-23: 1000 mg via ORAL
  Filled 2023-05-23: qty 2

## 2023-05-23 NOTE — ED Notes (Signed)
C-collar removed by provider

## 2023-05-23 NOTE — ED Notes (Signed)
Pt request C collar to be placed back on her neck. Pt states it is more comfortable to have the neck brace back on.

## 2023-05-23 NOTE — Discharge Instructions (Addendum)
Evaluation today was overall reassuring.  We did sustain a bruise under the right eye but otherwise no acute findings on CT scans or x-rays.  Recommend you follow-up with your PCP.  If your symptoms worsen anyway you develop a headache, visual disturbance, weakness or numbness in extremities, facial droop or any other concerning symptom please return emergency department further evaluation.

## 2023-05-23 NOTE — ED Provider Notes (Signed)
[facility] AT [facility] Provider Note   CSN: [id number] Arrival date & time: 05/23/23  0818     History  Chief Complaint  Patient presents with   Fall   Head Injury   HPI Meghan Wilson is a 62 y.o. female with type 2 diabetes, bipolar disorder and hyperlipidemia presenting for fall and head injury.  States around 10 PM last night she was helping her grandson use the bathroom when she slipped and fell over a toy truck on the floor she then fell to the ground hitting her right eye and landing on her right shoulder as well.  Now with bruising to the right eye and right shoulder pain.  Her husband looked in her eyes at that point and noticed that they were dilated and would not constrict with light but symmetric.  Denies neck pain.  States she does feel like "sparklers" going off in her head.   Fall  Head Injury      Home Medications Prior to Admission medications   Medication Sig Start Date End Date Taking? Authorizing Provider  Accu-Chek FastClix Lancets MISC use 1 strip to check blood sugar up to 7 times a week 02/01/22   Gwenevere Abbot, MD  atorvastatin (LIPITOR) 80 MG tablet Take 1 tablet (80 mg total) by mouth daily. 10/23/22   Gwenevere Abbot, MD  bacitracin-polymyxin b (POLYSPORIN) ophthalmic ointment Apply a small amount into the right eye 3 (three) times daily. 10/09/22     Cholecalciferol (VITAMIN D3) 50 MCG (2000 UT) capsule Take 1 capsule (2,000 Units total) by mouth daily. 03/23/23   Worthy Rancher, MD  cycloSPORINE (RESTASIS) 0.05 % ophthalmic emulsion Instill 1 drop into both eyes twice a day 06/23/22     doxepin (SINEQUAN) 10 MG capsule Take 1 capsule (10 mg total) by mouth at bedtime as needed for sleep 01/05/23     erythromycin ophthalmic ointment Apply a small amount into the right eye 3 (three) times daily. 10/09/22     ferrous sulfate 325 (65 FE) MG tablet Take 325 mg by mouth daily with breakfast.    [provider]   fluorometholone (FML) 0.1 % ophthalmic suspension Place 1 drop into the right eye 4 (four) times daily. 10/21/22     fluticasone (FLONASE) 50 MCG/ACT nasal spray Place 1 spray into both nostrils daily as needed for allergies or rhinitis 05/30/21 05/30/22  Gwenevere Abbot, MD  Glycerin-Hypromellose-PEG 400 (DRY EYE RELIEF DROPS) 0.2-0.2-1 % SOLN Place 1 drop into both eyes in the morning and at bedtime.    [provider]  lamoTRIgine (LAMICTAL) 100 MG tablet Take 1 tablet by mouth 2 times a day 10/09/22     lamoTRIgine (LAMICTAL) 100 MG tablet Take 1 tablet by mouth once a day 01/05/23     linaclotide (LINZESS) 145 MCG CAPS capsule Take 1 capsule (145 mcg total) by mouth daily before breakfast. 09/29/22   Armbruster, Willaim Rayas, MD  meloxicam (MOBIC) 15 MG tablet Take 1 tablet (15 mg total) by mouth daily. 05/28/22     metFORMIN (GLUCOPHAGE-XR) 500 MG 24 hr tablet Take 1 tablet (500 mg total) by mouth daily with breakfast. 04/16/23   Worthy Rancher, MD  ondansetron (ZOFRAN) 4 MG tablet Take 1 tablet (4 mg total) by mouth every 6 (six) hours as needed for nausea. 05/14/22   Cassandria Anger, PA-C  polyethylene glycol (MIRALAX / GLYCOLAX) 17 g packet Mix 1 packet (17 g total) in 8 oz clear liquid and drink  by mouth daily as needed for mild constipation. 10/18/21   Cassandria Anger, PA-C  prazosin (MINIPRESS) 1 MG capsule Take 1 capsule by mouth at bedtime 01/05/23     pregabalin (LYRICA) 75 MG capsule Take 1 capsule (75 mg total) by mouth at bedtime as needed. Patient taking differently: Take 75 mg by mouth at bedtime. 04/07/22   Steffanie Rainwater, MD  Semaglutide, 2 MG/DOSE, 8 MG/3ML SOPN Inject 2 mg as directed once a week. 05/14/23   Opalski, Gavin Pound, DO  triamcinolone cream (KENALOG) 0.1 % Apply 1 Application topically daily. Apply underneath breast as needed 01/13/23   Rana Snare, DO  triamcinolone ointment (KENALOG) 0.5 % Apply 1 Application topically daily as needed. Apply to hands as needed.  01/13/23   Rana Snare, DO      Allergies    Aspirin, Depakote [divalproex sodium], Ibuprofen, Prednisone, and Latex    Review of Systems   See HPI for pertinent positives   Physical Exam   Vitals:   05/23/23 1125 05/23/23 1149  BP:  125/75  Pulse: (!) 104 98  Resp:  13  Temp:  98.2 F (36.8 C)  SpO2: 98% 97%    CONSTITUTIONAL:  well-appearing, NAD NEURO: GCS 15. Speech is goal oriented. No deficits appreciated to CN III-XII; symmetric eyebrow raise, no facial drooping, tongue midline. Patient has equal grip strength bilaterally with 5/5 strength against resistance in all major muscle groups bilaterally. Sensation to light touch intact. Patient moves extremities without ataxia. Normal finger-nose-finger. Patient ambulatory with steady gait. Head: Right inferior periorbital hematoma noted otherwise atraumatic.  No Battle sign, raccoon eyes or rhinorrhea. EYES:  eyes equal and reactive ENT/NECK:  Supple, no stridor  CARDIO:  regular rate and rhythm, appears well-perfused  PULM:  No respiratory distress, CTAB GI/GU:  non-distended, soft MSK/SPINE:  No gross deformities, no edema, moves all extremities, range of motion right shoulder normal, radial pulses 2+ bilaterally.  Sensation intact distally.  No obvious deformity of the right shoulder, no ecchymosis edema step-off or crepitus. SKIN:  no rash, atraumatic   *Additional and/or pertinent findings included in MDM below    ED Results / Procedures / Treatments   Labs (all labs ordered are listed, but only abnormal results are displayed) Labs Reviewed - No data to display  EKG None  Radiology DG Shoulder Right  Result Date: 05/23/2023 CLINICAL DATA:  Fall, pain EXAM: RIGHT SHOULDER - 2+ VIEW COMPARISON:  None Available. FINDINGS: There is no acute fracture or dislocation. Glenohumeral and acromioclavicular alignment is normal. Calcification about the greater tuberosity may reflect sequela of calcific tendinitis. There is  no erosive change. The soft tissues are otherwise unremarkable. IMPRESSION: 1. No acute fracture or dislocation. 2. Calcification about the humeral head may reflect calcific tendinitis. Electronically Signed   By: Lesia Hausen M.D.   On: 05/23/2023 12:13   CT Maxillofacial WO CM  Result Date: 05/23/2023 CLINICAL DATA:  Facial trauma EXAM: CT MAXILLOFACIAL WITHOUT CONTRAST TECHNIQUE: Multidetector CT imaging of the maxillofacial structures was performed. Multiplanar CT image reconstructions were also generated. RADIATION DOSE REDUCTION: This exam was performed according to the departmental dose-optimization program which includes automated exposure control, adjustment of the mA and/or kV according to patient size and/or use of iterative reconstruction technique. COMPARISON:  None Available. FINDINGS: Osseous: Negative for fracture or mandibular dislocation. Orbits: No visible injury. Sinuses: Negative for hemosinus. Soft tissues: Contusion to the right cheek without opaque foreign body. Limited intracranial: No visible injury.  Preceding head CT.  IMPRESSION: Right cheek contusion without underlying fracture. Electronically Signed   By: Tiburcio Pea M.D.   On: 05/23/2023 09:32   CT Cervical Spine Wo Contrast  Result Date: 05/23/2023 CLINICAL DATA:  Fall yesterday with head trauma. EXAM: CT HEAD WITHOUT CONTRAST CT CERVICAL SPINE WITHOUT CONTRAST TECHNIQUE: Multidetector CT imaging of the head and cervical spine was performed following the standard protocol without intravenous contrast. Multiplanar CT image reconstructions of the cervical spine were also generated. RADIATION DOSE REDUCTION: This exam was performed according to the departmental dose-optimization program which includes automated exposure control, adjustment of the mA and/or kV according to patient size and/or use of iterative reconstruction technique. COMPARISON:  Head CT 07/06/2022. FINDINGS: CT HEAD FINDINGS Brain: Ventricles, cisterns and  other CSF spaces are normal. No evidence of mass, mass effect, shift of midline structures or acute hemorrhage. No evidence of acute infarction. Possible old lacunar infarct versus prominent perivascular space of the posterior aspect of the left ischemic. Vascular: No hyperdense vessel or unexpected calcification. Skull: Normal. Negative for fracture or focal lesion. Sinuses/Orbits: No acute finding. Other: None. CT CERVICAL SPINE FINDINGS Alignment: No posttraumatic subluxation. Skull base and vertebrae: Vertebral body heights are preserved. There is mild spondylosis throughout the cervical spine two fluid uncovertebral joint spurring and mild facet arthropathy. Bilateral neural foramen narrowing at the C4-5 level and C5-6 levels right worse than left. No acute fracture. Soft tissues and spinal canal: No prevertebral fluid or swelling. No visible canal hematoma. Disc levels:  Disc space narrowing at the C4-5 and C5-6 levels. Upper chest: No acute findings. Other: None. IMPRESSION: 1. No acute brain injury. 2. Possible old lacunar infarct versus prominent perivascular space of the posterior aspect of the left basal ganglia. 3. No acute cervical spine injury. 4. Mild spondylosis of the cervical spine with disc disease at the C4-5 and C5-6 levels. Bilateral neural foramen narrowing at the C4-5 and C5-6 levels right worse than left. Electronically Signed   By: Elberta Fortis M.D.   On: 05/23/2023 08:50   CT Head Wo Contrast  Result Date: 05/23/2023 CLINICAL DATA:  Fall yesterday with head trauma. EXAM: CT HEAD WITHOUT CONTRAST CT CERVICAL SPINE WITHOUT CONTRAST TECHNIQUE: Multidetector CT imaging of the head and cervical spine was performed following the standard protocol without intravenous contrast. Multiplanar CT image reconstructions of the cervical spine were also generated. RADIATION DOSE REDUCTION: This exam was performed according to the departmental dose-optimization program which includes automated  exposure control, adjustment of the mA and/or kV according to patient size and/or use of iterative reconstruction technique. COMPARISON:  Head CT 07/06/2022. FINDINGS: CT HEAD FINDINGS Brain: Ventricles, cisterns and other CSF spaces are normal. No evidence of mass, mass effect, shift of midline structures or acute hemorrhage. No evidence of acute infarction. Possible old lacunar infarct versus prominent perivascular space of the posterior aspect of the left ischemic. Vascular: No hyperdense vessel or unexpected calcification. Skull: Normal. Negative for fracture or focal lesion. Sinuses/Orbits: No acute finding. Other: None. CT CERVICAL SPINE FINDINGS Alignment: No posttraumatic subluxation. Skull base and vertebrae: Vertebral body heights are preserved. There is mild spondylosis throughout the cervical spine two fluid uncovertebral joint spurring and mild facet arthropathy. Bilateral neural foramen narrowing at the C4-5 level and C5-6 levels right worse than left. No acute fracture. Soft tissues and spinal canal: No prevertebral fluid or swelling. No visible canal hematoma. Disc levels:  Disc space narrowing at the C4-5 and C5-6 levels. Upper chest: No acute findings. Other: None. IMPRESSION: 1.  No acute brain injury. 2. Possible old lacunar infarct versus prominent perivascular space of the posterior aspect of the left basal ganglia. 3. No acute cervical spine injury. 4. Mild spondylosis of the cervical spine with disc disease at the C4-5 and C5-6 levels. Bilateral neural foramen narrowing at the C4-5 and C5-6 levels right worse than left. Electronically Signed   By: Elberta Fortis M.D.   On: 05/23/2023 08:50    Procedures Procedures    Medications Ordered in ED Medications  acetaminophen (TYLENOL) tablet 1,000 mg (1,000 mg Oral Given 05/23/23 1200)    ED Course/ Medical Decision Making/ A&P                                 Medical Decision Making Amount and/or Complexity of Data Reviewed Radiology:  ordered.  Risk OTC drugs.   62 yo well appearing female presenting for evaluation status post fall yesterday.  Exam notable for right inferior periorbital hematoma otherwise reassuring.  DDx includes skull fracture, ICH, periorbital fracture, shoulder fracture dislocation, traumatic neck injury, other.  Overall patient is well-appearing, no acute distress and hemodynamically stable.  Aside from right cheek contusion CT scans and x-rays without acute findings.  Hematoma does not appear to be invading into visual field or orbital region.  Treated her pain with Tylenol.  Advised her to follow-up with her PCP.  Vital stable.  Discussed return precautions.  Discharged home good condition.        Final Clinical Impression(s) / ED Diagnoses Final diagnoses:  Fall, initial encounter  Facial contusion, initial encounter    Rx / DC Orders ED Discharge Orders     None         Gareth Eagle, PA-C 05/23/23 1245    Lorre Nick, MD 05/25/23 [phone]

## 2023-05-23 NOTE — ED Triage Notes (Addendum)
Pt presents with c/o fall that occurred yesterday. Pt reports she hit her face on the floor when she fell. Swelling noted to the right cheekbone and some bruising under her right eye. Pt also has a knot to the top of her head and is c/o neck pain. Pt's pupils appear fixed and dilated with a pen light. Pt reports nausea and says that it feels like "sparklers" are going off in her head. C-collar to be placed in triage.

## 2023-05-23 NOTE — ED Notes (Signed)
Pt reports having right ear pain that has been effecting her balance for the "last few weeks."

## 2023-06-18 ENCOUNTER — Ambulatory Visit (INDEPENDENT_AMBULATORY_CARE_PROVIDER_SITE_OTHER): Payer: MEDICAID | Admitting: Family Medicine

## 2023-06-18 ENCOUNTER — Other Ambulatory Visit: Payer: Self-pay

## 2023-06-18 ENCOUNTER — Ambulatory Visit (INDEPENDENT_AMBULATORY_CARE_PROVIDER_SITE_OTHER): Payer: MEDICAID | Admitting: Internal Medicine

## 2023-06-18 ENCOUNTER — Encounter: Payer: Self-pay | Admitting: Nurse Practitioner

## 2023-06-18 ENCOUNTER — Other Ambulatory Visit (HOSPITAL_COMMUNITY): Payer: Self-pay

## 2023-06-18 ENCOUNTER — Encounter (INDEPENDENT_AMBULATORY_CARE_PROVIDER_SITE_OTHER): Payer: Self-pay | Admitting: Family Medicine

## 2023-06-18 VITALS — BP 108/72 | HR 89 | Temp 98.3°F | Ht 63.0 in | Wt 173.0 lb

## 2023-06-18 DIAGNOSIS — Z6831 Body mass index (BMI) 31.0-31.9, adult: Secondary | ICD-10-CM

## 2023-06-18 DIAGNOSIS — Z6830 Body mass index (BMI) 30.0-30.9, adult: Secondary | ICD-10-CM

## 2023-06-18 DIAGNOSIS — E1169 Type 2 diabetes mellitus with other specified complication: Secondary | ICD-10-CM | POA: Diagnosis not present

## 2023-06-18 DIAGNOSIS — E669 Obesity, unspecified: Secondary | ICD-10-CM

## 2023-06-18 DIAGNOSIS — Z7985 Long-term (current) use of injectable non-insulin antidiabetic drugs: Secondary | ICD-10-CM

## 2023-06-18 DIAGNOSIS — Z7984 Long term (current) use of oral hypoglycemic drugs: Secondary | ICD-10-CM | POA: Diagnosis not present

## 2023-06-18 MED ORDER — SEMAGLUTIDE (2 MG/DOSE) 8 MG/3ML ~~LOC~~ SOPN
2.0000 mg | PEN_INJECTOR | SUBCUTANEOUS | 0 refills | Status: DC
  Filled 2023-06-18 – 2023-06-22 (×2): qty 3, 28d supply, fill #0

## 2023-06-18 MED ORDER — METFORMIN HCL ER 500 MG PO TB24
500.0000 mg | ORAL_TABLET | Freq: Every day | ORAL | 0 refills | Status: DC
  Filled 2023-06-18: qty 90, 90d supply, fill #0

## 2023-06-18 NOTE — Progress Notes (Signed)
Meghan Wilson, D.O.  ABFM, ABOM Specializing in Clinical Bariatric Medicine  Office located at: [address] [place], [place]  [address]   Assessment and Plan:   Medications Discontinued During This Encounter  Medication Reason   lamoTRIgine (LAMICTAL) 100 MG tablet    Cholecalciferol (VITAMIN D3) 50 MCG (2000 UT) capsule    metFORMIN (GLUCOPHAGE-XR) 500 MG 24 hr tablet Reorder   Semaglutide, 2 MG/DOSE, 8 MG/3ML SOPN Reorder     Meds ordered this encounter  Medications   metFORMIN (GLUCOPHAGE-XR) 500 MG 24 hr tablet    Sig: Take 1 tablet (500 mg total) by mouth daily with breakfast.    Dispense:  90 tablet    Refill:  0   Semaglutide, 2 MG/DOSE, 8 MG/3ML SOPN    Sig: Inject 2 mg as directed once a week.    Dispense:  3 mL    Refill:  0   Type 2 diabetes mellitus with obesity (HCC) Assessment & Plan: Most recent Hemoglobin A1c on 03/11/23: 6.1. T2DM managed with Semaglutide 2 mg once a week and Metformin XR 500 mg daily. Hunger and cravings are pretty well controlled. Every once in a while she feels hungry.  Reminded patient that having adequate amounts of protein with each meal is important for controlling hunger & increasing muscle/metabolism. Will refill Semaglutide & Metformin today; no dose change. May consider switching to [facility] in the future. Continue with weight loss therapy. Pt informed that MeghanMaldonado may want to obtain some labs next OV.    BMI 31.0-31.9,adult Current BMI 30.65 Obesity (HCC)-start bmi 35.67 Assessment & Plan: Since last office visit on 05/14/23 patient's muscle mass has decreased by 0.2 lb. Fat mass has decreased by 1.8 lb. Total body water has decreased by 3.6 lb.  Counseling done on how various foods will affect these numbers and how to maximize success  Total lbs lost to date: - 22 lbs  Total weight loss percentage to date: 11.28%  No change to meal plan - see Subjective  Behavioral Intervention Additional resources provided  today: n/a Evidence-based interventions for health behavior change were utilized today including the discussion of self monitoring techniques, problem-solving barriers and SMART goal setting techniques.   Regarding patient's less desirable eating habits and patterns, we employed the technique of small changes.  Pt will specifically work on: going to the gym 3 days a week  for next visit.    FOLLOW UP: Return 07/09/23. She was informed of the importance of frequent follow up visits to maximize her success with intensive lifestyle modifications for her multiple health conditions.  Subjective:   Chief complaint: Obesity Meghan Wilson is here to discuss her progress with her obesity treatment plan. She is on the the Category 2 Plan with B/L options and states she is following her eating plan approximately 85% of the time. She states she is doing pt for her shoulder.   Interval History:  Meghan Wilson is here for a follow up office visit. Since last OV, Meghan Wilson has been doing well. Pt reports that her final PT session for her shoulders is  this Friday. Reports being okayed by physical therapist and orthopedist to return to gym. Pt feels that clothes are fitting looser. She is getting in 6-8 ounces of lean protein at lunch and 4 ounces at dinner. She feels full and satisfied. Her constipation has been stable and only uses Linzess occasionally.   Pharmacotherapy for weight loss: She is currently taking  Metformin XR 500 mg  daily & Semaglutide 2 mg once a week    for medical weight loss.  Denies side effects.    Review of Systems:  Pertinent positives were addressed with patient today.  Reviewed by clinician on day of visit: allergies, medications, problem list, medical history, surgical history, family history, social history, and previous encounter notes.  Weight Summary and Biometrics   Weight Lost Since Last Visit: 2lb  Weight Gained Since Last Visit: 0lb   Vitals Temp: 98.3 F (36.8 C) BP:  108/72 Pulse Rate: 89 SpO2: 97 %   Anthropometric Measurements Height: 5\' 3"  (1.6 m) Weight: 173 lb (78.5 kg) BMI (Calculated): 30.65 Weight at Last Visit: 175lb Weight Lost Since Last Visit: 2lb Weight Gained Since Last Visit: 0lb Starting Weight: 195 lb Total Weight Loss (lbs): 22 lb (9.979 kg) Peak Weight: 207 lb   Body Composition  Body Fat %: 45.1 % Fat Mass (lbs): 78.2 lbs Muscle Mass (lbs): 90.6 lbs Total Body Water (lbs): 63.8 lbs Visceral Fat Rating : 12   Other Clinical Data Fasting: No Labs: No Today's Visit #: 24 Starting Date: 11/22/22   Objective:   PHYSICAL EXAM: Blood pressure 108/72, pulse 89, temperature 98.3 F (36.8 C), height 5\' 3"  (1.6 m), weight 173 lb (78.5 kg), SpO2 97%. Body mass index is 30.65 kg/m.  General: Well Developed, well nourished, and in no acute distress.  HEENT: Normocephalic, atraumatic Skin: Warm and dry, cap RF less 2 sec, good turgor Chest:  Normal excursion, shape, no gross abn Respiratory: speaking in full sentences, no conversational dyspnea NeuroM-Sk: Ambulates w/o assistance, moves * 4 Psych: A and O *3, insight good, mood-full  DIAGNOSTIC DATA REVIEWED:  BMET    Component Value Date/Time   NA 138 03/11/2023 0845   K 4.6 03/11/2023 0845   CL 100 03/11/2023 0845   CO2 24 03/11/2023 0845   GLUCOSE 83 03/11/2023 0845   GLUCOSE 100 (H) 07/06/2022 0944   BUN 9 03/11/2023 0845   CREATININE 0.57 03/11/2023 0845   CALCIUM 9.6 03/11/2023 0845   GFRNONAA >60 07/06/2022 0944   GFRAA 119 05/11/2020 0935   Lab Results  Component Value Date   HGBA1C 6.1 (H) 03/11/2023   HGBA1C 6.5 (H) 04/02/2018   Lab Results  Component Value Date   INSULIN 6.1 03/11/2023   INSULIN 5.8 11/21/2021   Lab Results  Component Value Date   TSH 0.976 11/21/2021   CBC    Component Value Date/Time   WBC 7.4 03/11/2023 0915   WBC 7.3 07/06/2022 0944   RBC 4.53 03/11/2023 0915   HGB 12.9 03/11/2023 0915   HGB 11.9 06/25/2022  1420   HCT 39.1 03/11/2023 0915   HCT 38.5 06/25/2022 1420   PLT 304 03/11/2023 0915   PLT 398 06/25/2022 1420   MCV 86.3 03/11/2023 0915   MCV 81 06/25/2022 1420   MCH 28.5 03/11/2023 0915   MCHC 33.0 03/11/2023 0915   RDW 14.6 03/11/2023 0915   RDW 13.5 06/25/2022 1420   Iron Studies    Component Value Date/Time   IRON 98 03/11/2023 0915   IRON 24 (L) 06/25/2022 1420   TIBC 360 03/11/2023 0915   TIBC 351 06/25/2022 1420   FERRITIN 195 03/11/2023 0916   FERRITIN 81 06/25/2022 1420   IRONPCTSAT 27 03/11/2023 0915   IRONPCTSAT 7 (LL) 06/25/2022 1420   Lipid Panel     Component Value Date/Time   CHOL 144 09/04/2022 1027   TRIG 52 09/04/2022 1027   HDL 70  09/04/2022 1027   CHOLHDL 2.7 05/02/2021 1158   CHOLHDL 2.9 04/02/2018 1115   VLDL 51 (H) 04/02/2018 1115   LDLCALC 63 09/04/2022 1027   Hepatic Function Panel     Component Value Date/Time   PROT 6.5 03/11/2023 0845   ALBUMIN 4.4 03/11/2023 0845   AST 16 03/11/2023 0845   ALT 26 03/11/2023 0845   ALKPHOS 100 03/11/2023 0845   BILITOT 0.3 03/11/2023 0845      Component Value Date/Time   TSH 0.976 11/21/2021 1007   Nutritional Lab Results  Component Value Date   VD25OH 78.3 03/11/2023   VD25OH 54.3 09/04/2022   VD25OH 34.2 11/21/2021    Attestations:   I, Special Puri, acting as a Stage manager for [employer], DO., have compiled all relevant documentation for today's office visit on behalf of Thomasene Lot, DO, while in the presence of [employer], DO.  I have reviewed the above documentation for accuracy and completeness, and I agree with the above. Meghan Wilson, D.O.  The 21st Century Cures Act was signed into law in 2016 which includes the topic of electronic health records.  This provides immediate access to information in MyChart.  This includes consultation notes, operative notes, office notes, lab results and pathology reports.  If you have any questions about what you read please  let us know at your next visit so we can discuss your concerns and take corrective action if need be.  We are right here with you.

## 2023-06-22 ENCOUNTER — Other Ambulatory Visit (HOSPITAL_COMMUNITY): Payer: Self-pay

## 2023-06-23 ENCOUNTER — Telehealth (INDEPENDENT_AMBULATORY_CARE_PROVIDER_SITE_OTHER): Payer: Self-pay

## 2023-06-23 NOTE — Telephone Encounter (Signed)
Response from insurance via cover my meds.  Prior authorization is not required.  Patient notified.

## 2023-06-23 NOTE — Telephone Encounter (Signed)
Prior Berkley Harvey has been submitted has been started for Ozempic.  Awaiting questions.

## 2023-07-01 ENCOUNTER — Other Ambulatory Visit (HOSPITAL_COMMUNITY): Payer: Self-pay

## 2023-07-01 LAB — HM DIABETES EYE EXAM

## 2023-07-01 MED ORDER — CYCLOSPORINE 0.05 % OP EMUL
1.0000 [drp] | Freq: Two times a day (BID) | OPHTHALMIC | 3 refills | Status: DC
  Filled 2023-07-01: qty 180, 90d supply, fill #0
  Filled 2023-11-13: qty 180, 90d supply, fill #1
  Filled 2024-03-09 (×2): qty 60, 30d supply, fill #2
  Filled 2024-04-13: qty 60, 30d supply, fill #3
  Filled 2024-05-30: qty 60, 30d supply, fill #4

## 2023-07-03 ENCOUNTER — Other Ambulatory Visit (HOSPITAL_COMMUNITY): Payer: Self-pay

## 2023-07-03 ENCOUNTER — Encounter (HOSPITAL_COMMUNITY): Payer: Self-pay | Admitting: Emergency Medicine

## 2023-07-03 ENCOUNTER — Emergency Department (HOSPITAL_COMMUNITY)
Admission: EM | Admit: 2023-07-03 | Discharge: 2023-07-03 | Disposition: A | Discharge status: Home / Self Care | Payer: MEDICAID | Attending: Emergency Medicine | Admitting: Emergency Medicine

## 2023-07-03 ENCOUNTER — Other Ambulatory Visit: Payer: Self-pay

## 2023-07-03 ENCOUNTER — Encounter: Payer: Self-pay | Admitting: Nurse Practitioner

## 2023-07-03 DIAGNOSIS — E119 Type 2 diabetes mellitus without complications: Secondary | ICD-10-CM | POA: Insufficient documentation

## 2023-07-03 DIAGNOSIS — Z91040 Latex allergy status: Secondary | ICD-10-CM | POA: Insufficient documentation

## 2023-07-03 DIAGNOSIS — Z7984 Long term (current) use of oral hypoglycemic drugs: Secondary | ICD-10-CM | POA: Insufficient documentation

## 2023-07-03 DIAGNOSIS — J069 Acute upper respiratory infection, unspecified: Secondary | ICD-10-CM | POA: Insufficient documentation

## 2023-07-03 DIAGNOSIS — Z1152 Encounter for screening for COVID-19: Secondary | ICD-10-CM | POA: Diagnosis not present

## 2023-07-03 DIAGNOSIS — J029 Acute pharyngitis, unspecified: Secondary | ICD-10-CM | POA: Diagnosis present

## 2023-07-03 LAB — RESP PANEL BY RT-PCR (RSV, FLU A&B, COVID)  RVPGX2
Influenza A by PCR: NEGATIVE
Influenza B by PCR: NEGATIVE
Resp Syncytial Virus by PCR: NEGATIVE
SARS Coronavirus 2 by RT PCR: NEGATIVE

## 2023-07-03 LAB — GROUP A STREP BY PCR: Group A Strep by PCR: NOT DETECTED

## 2023-07-03 MED ORDER — LIDOCAINE VISCOUS HCL 2 % MT SOLN
15.0000 mL | OROMUCOSAL | 0 refills | Status: AC | PRN
  Filled 2023-07-03: qty 100, 7d supply, fill #0

## 2023-07-03 MED ORDER — BENZONATATE 100 MG PO CAPS
100.0000 mg | ORAL_CAPSULE | Freq: Three times a day (TID) | ORAL | 0 refills | Status: AC | PRN
  Filled 2023-07-03: qty 42, 14d supply, fill #0

## 2023-07-03 MED ORDER — ALUM & MAG HYDROXIDE-SIMETH 200-200-20 MG/5ML PO SUSP
30.0000 mL | Freq: Once | ORAL | Status: AC
  Administered 2023-07-03: 30 mL via ORAL
  Filled 2023-07-03: qty 30

## 2023-07-03 MED ORDER — LIDOCAINE VISCOUS HCL 2 % MT SOLN
15.0000 mL | Freq: Once | OROMUCOSAL | Status: AC
  Administered 2023-07-03: 15 mL via OROMUCOSAL
  Filled 2023-07-03: qty 15

## 2023-07-03 NOTE — ED Triage Notes (Signed)
Patient arrives ambulatory by POV c/o sore throat, shallow breathing, headaches and lack of appetite onset of Sunday. States she has been taking tylenol and Nyquil.

## 2023-07-03 NOTE — ED Provider Notes (Signed)
[place] [facility] AT [facility] Provider Note   CSN: [id number] Arrival date & time: 07/03/23  7829     History  Chief Complaint  Patient presents with   Sore Throat   Headache    Meghan Wilson is a 62 y.o. female with a history of type 2 diabetes mellitus, OSA, and anemia presents the ED today for sore throat.  Patient reports sore throat, headaches, body aches, and lack of appetite for the past 6 days.  Patient reports fever of 101F on the first day but denies fevers since then. She states that her throat feels like it's "burning" and radiates to her sternum, which has been causing her difficulty with swallowing.  She has taken Tylenol, DayQuil, NyQuil for her symptoms with minimal relief.  She states that her fianc had similar symptoms last week.  Denies weakness, shortness of breath, or chest pain.  No other complaints or concerns at this time.    Home Medications Prior to Admission medications   Medication Sig Start Date End Date Taking? Authorizing Provider  benzonatate (TESSALON) 100 MG capsule Take 1 capsule (100 mg total) by mouth 3 (three) times daily as needed for up to 14 days for cough. 07/03/23 07/17/23 Yes Maxwell Marion, PA-C  lidocaine (XYLOCAINE) 2 % solution Use as directed 15 mLs in the mouth or throat as needed for up to 7 days for mouth pain. 07/03/23 07/10/23 Yes Maxwell Marion, PA-C  Accu-Chek FastClix Lancets MISC use 1 strip to check blood sugar up to 7 times a week 02/01/22   Gwenevere Abbot, MD  atorvastatin (LIPITOR) 80 MG tablet Take 1 tablet (80 mg total) by mouth daily. 10/23/22   Gwenevere Abbot, MD  bacitracin-polymyxin b (POLYSPORIN) ophthalmic ointment Apply a small amount into the right eye 3 (three) times daily. 10/09/22     cycloSPORINE (RESTASIS) 0.05 % ophthalmic emulsion Instill 1 drop into both eyes twice a day 06/23/22     cycloSPORINE (RESTASIS) 0.05 % ophthalmic emulsion Place 1 drop into both eyes 2 (two) times daily. 07/01/23      doxepin (SINEQUAN) 10 MG capsule Take 1 capsule (10 mg total) by mouth at bedtime as needed for sleep 01/05/23     erythromycin ophthalmic ointment Apply a small amount into the right eye 3 (three) times daily. 10/09/22     ferrous sulfate 325 (65 FE) MG tablet Take 325 mg by mouth daily with breakfast.    [provider]  fluorometholone (FML) 0.1 % ophthalmic suspension Place 1 drop into the right eye 4 (four) times daily. 10/21/22     fluticasone (FLONASE) 50 MCG/ACT nasal spray Place 1 spray into both nostrils daily as needed for allergies or rhinitis 05/30/21 06/18/23  Gwenevere Abbot, MD  Glycerin-Hypromellose-PEG 400 (DRY EYE RELIEF DROPS) 0.2-0.2-1 % SOLN Place 1 drop into both eyes in the morning and at bedtime.    [provider]  lamoTRIgine (LAMICTAL) 100 MG tablet Take 1 tablet by mouth once a day 01/05/23     linaclotide (LINZESS) 145 MCG CAPS capsule Take 1 capsule (145 mcg total) by mouth daily before breakfast. 09/29/22   Armbruster, Willaim Rayas, MD  meloxicam (MOBIC) 15 MG tablet Take 1 tablet (15 mg total) by mouth daily. 05/28/22     metFORMIN (GLUCOPHAGE-XR) 500 MG 24 hr tablet Take 1 tablet (500 mg total) by mouth daily with breakfast. 06/18/23   Opalski, Gavin Pound, DO  ondansetron (ZOFRAN) 4 MG tablet Take 1 tablet (4 mg total) by  mouth every 6 (six) hours as needed for nausea. 05/14/22   Cassandria Anger, PA-C  polyethylene glycol (MIRALAX / GLYCOLAX) 17 g packet Mix 1 packet (17 g total) in 8 oz clear liquid and drink by mouth daily as needed for mild constipation. 10/18/21   Cassandria Anger, PA-C  prazosin (MINIPRESS) 1 MG capsule Take 1 capsule by mouth at bedtime 01/05/23     pregabalin (LYRICA) 75 MG capsule Take 1 capsule (75 mg total) by mouth at bedtime as needed. Patient taking differently: Take 75 mg by mouth at bedtime. 04/07/22   Steffanie Rainwater, MD  Semaglutide, 2 MG/DOSE, 8 MG/3ML SOPN Inject 2 mg as directed once a week. 06/18/23   Opalski, Gavin Pound, DO   triamcinolone cream (KENALOG) 0.1 % Apply 1 Application topically daily. Apply underneath breast as needed 01/13/23   Rana Snare, DO  triamcinolone ointment (KENALOG) 0.5 % Apply 1 Application topically daily as needed. Apply to hands as needed. 01/13/23   Rana Snare, DO      Allergies    Aspirin, Depakote [divalproex sodium], Ibuprofen, Prednisone, and Latex    Review of Systems   Review of Systems  HENT:  Positive for sore throat.   All other systems reviewed and are negative.   Physical Exam Updated Vital Signs BP (!) 144/86 (BP Location: Left Arm)   Pulse 96   Temp 98.1 F (36.7 C) (Oral)   Resp 17   Ht 5\' 3"  (1.6 m)   Wt 78.5 kg   SpO2 100%   BMI 30.65 kg/m  Physical Exam Vitals and nursing note reviewed.  Constitutional:      Appearance: Normal appearance.  HENT:     Head: Normocephalic and atraumatic.     Mouth/Throat:     Mouth: Mucous membranes are moist.     Pharynx: Oropharynx is clear. Posterior oropharyngeal erythema present.  Eyes:     Conjunctiva/sclera: Conjunctivae normal.     Pupils: Pupils are equal, round, and reactive to light.  Cardiovascular:     Rate and Rhythm: Normal rate and regular rhythm.     Pulses: Normal pulses.     Heart sounds: Normal heart sounds.  Pulmonary:     Effort: Pulmonary effort is normal.     Breath sounds: Normal breath sounds.  Abdominal:     Palpations: Abdomen is soft.     Tenderness: There is no abdominal tenderness.  Musculoskeletal:     Cervical back: Normal range of motion and neck supple.  Lymphadenopathy:     Cervical: Cervical adenopathy present.  Skin:    General: Skin is warm and dry.     Findings: No rash.  Neurological:     General: No focal deficit present.     Mental Status: She is alert.     Sensory: No sensory deficit.     Motor: No weakness.  Psychiatric:        Mood and Affect: Mood normal.        Behavior: Behavior normal.    ED Results / Procedures / Treatments   Labs (all  labs ordered are listed, but only abnormal results are displayed) Labs Reviewed  RESP PANEL BY RT-PCR (RSV, FLU A&B, COVID)  [id number]  GROUP A STREP BY PCR    EKG None  Radiology No results found.  Procedures Procedures: not indicated.   Medications Ordered in ED Medications  lidocaine (XYLOCAINE) 2 % viscous mouth solution 15 mL (has no administration in time range)  alum &  mag hydroxide-simeth (MAALOX/MYLANTA) 200-200-20 MG/5ML suspension 30 mL (30 mLs Oral Given 07/03/23 2841)    ED Course/ Medical Decision Making/ A&P                                 Medical Decision Making Risk OTC drugs. Prescription drug management.   This patient presents to the ED for concern of sore throat, this involves an extensive number of treatment options, and is a complaint that carries with it a high risk of complications and morbidity.   Differential diagnosis includes: strep throat, flu COVID, RSV, GERD, other URI, etc.   Comorbidities  See HPI above   Additional History  Additional history obtained from prior records.   Lab Tests  I ordered and personally interpreted labs.  The pertinent results include:   Negative for flu, covid, rsv Negative for strep throat   Problem List / ED Course / Critical Interventions / Medication Management  Sore throat x6 days I ordered medications including: Maalox for throat burning  Reevaluation of the patient after these medicines showed that the patient stayed the same. I then ordered viscous lidocaine prior to discharge I have reviewed the patients home medicines and have made adjustments as needed   Social Determinants of Health  Social connection   Test / Admission - Considered  Discussed findings with patient.  All questions were answered. She is hemodynamically stable and safe for discharge home. Return precautions provided.       Final Clinical Impression(s) / ED Diagnoses Final diagnoses:  Viral upper  respiratory tract infection  Pharyngitis, unspecified etiology    Rx / DC Orders ED Discharge Orders          Ordered    lidocaine (XYLOCAINE) 2 % solution  As needed        07/03/23 0941    benzonatate (TESSALON) 100 MG capsule  3 times daily PRN        07/03/23 0942              Maxwell Marion, PA-C 07/03/23 1007    Derwood Kaplan, MD 07/03/23 1324

## 2023-07-03 NOTE — Discharge Instructions (Addendum)
You are negative for strep throat, flu, covid, and RSV. Take viscous lidocaine as needed for sore throat and benzonatate as needed for cough. Take tylenol and ibuprofen as needed for fevers and body aches.  Follow up with your PCP in 5-7 days for reevaluation.  Return to the ED if your symptoms worsen in the interim.

## 2023-07-09 ENCOUNTER — Ambulatory Visit (INDEPENDENT_AMBULATORY_CARE_PROVIDER_SITE_OTHER): Payer: MEDICAID | Admitting: Internal Medicine

## 2023-07-09 ENCOUNTER — Other Ambulatory Visit (HOSPITAL_COMMUNITY): Payer: Self-pay

## 2023-07-09 ENCOUNTER — Encounter (INDEPENDENT_AMBULATORY_CARE_PROVIDER_SITE_OTHER): Payer: Self-pay | Admitting: Internal Medicine

## 2023-07-09 VITALS — BP 129/81 | HR 99 | Temp 98.0°F | Ht 63.0 in | Wt 173.0 lb

## 2023-07-09 DIAGNOSIS — E66812 Obesity, class 2: Secondary | ICD-10-CM

## 2023-07-09 DIAGNOSIS — G4733 Obstructive sleep apnea (adult) (pediatric): Secondary | ICD-10-CM

## 2023-07-09 DIAGNOSIS — Z7985 Long-term (current) use of injectable non-insulin antidiabetic drugs: Secondary | ICD-10-CM

## 2023-07-09 DIAGNOSIS — E1169 Type 2 diabetes mellitus with other specified complication: Secondary | ICD-10-CM | POA: Diagnosis not present

## 2023-07-09 DIAGNOSIS — E7801 Familial hypercholesterolemia: Secondary | ICD-10-CM | POA: Diagnosis not present

## 2023-07-09 DIAGNOSIS — Z6830 Body mass index (BMI) 30.0-30.9, adult: Secondary | ICD-10-CM

## 2023-07-09 MED ORDER — SEMAGLUTIDE (2 MG/DOSE) 8 MG/3ML ~~LOC~~ SOPN
2.0000 mg | PEN_INJECTOR | SUBCUTANEOUS | 0 refills | Status: DC
  Filled 2023-07-09 – 2023-07-22 (×2): qty 3, 28d supply, fill #0

## 2023-07-09 MED ORDER — METFORMIN HCL ER 500 MG PO TB24
500.0000 mg | ORAL_TABLET | Freq: Every day | ORAL | 0 refills | Status: DC
  Filled 2023-07-09 – 2023-08-31 (×2): qty 90, 90d supply, fill #0

## 2023-07-09 NOTE — Progress Notes (Signed)
Office: [phone]  /  Fax: [phone]  WEIGHT SUMMARY AND BIOMETRICS  Vitals Temp: 98 F (36.7 C) BP: 129/81 Pulse Rate: 99   Anthropometric Measurements Height: 5\' 3"  (1.6 m) Weight: 173 lb (78.5 kg) BMI (Calculated): 30.65 Weight at Last Visit: 173 lb Weight Lost Since Last Visit: 0 lb Weight Gained Since Last Visit: 0 lb Starting Weight: 195 lb Total Weight Loss (lbs): 22 lb (9.979 kg) Peak Weight: 207 lb   Body Composition  Body Fat %: 43.9 % Fat Mass (lbs): 76.2 lbs Muscle Mass (lbs): 92.4 lbs Total Body Water (lbs): 63.8 lbs Visceral Fat Rating : 12    No data recorded Today's Visit #: 25  Starting Date: 11/02/22   HPI  Chief Complaint: OBESITY  Meghan Wilson is here to discuss her progress with her obesity treatment plan. She is on the the Category 2 Plan and states she is following her eating plan approximately 85 % of the time. She states she is exercising 60 minutes 1 times per week.  Interval History:   Discussed the use of AI scribe software for clinical note transcription with the patient, who gave verbal consent to proceed.  History of Present Illness   Meghan Wilson, a patient with a history of type two diabetes, sleep apnea managed with CPAP, and hyperlipidemia, presents for a follow-up on weight management. The patient is on metformin XR 500mg  and semaglutide 2mg  weekly, along with other medications known to cause weight gain, such as Lyrica and doxepin.  The patient recently experienced a 24-hour flu, which significantly affected her appetite and dietary habits, leading to increased soup intake and a potential rise in sodium levels. Despite this, the patient's blood pressure remains well-controlled, and she denies a history of hypertension.  The patient's weight loss journey shows a gradual decrease from 207lbs in December 2022 to 173lbs currently. However, the patient reports a slowdown in weight loss since October, which coincides with a period  of physical therapy for "built-up calcium in the shoulders". This condition limited the patient's ability to perform regular gym exercises, potentially contributing to the slowed weight loss.  The patient also reports a significant fall last month, resulting in a concussion. The incident led to temporary vision changes, short-term memory issues, and a period of dizziness, which affected her ability to drive and use her CPAP machine.  The patient's diabetes care appears to be well-managed, with an A1c of 6.1 in July. She is up-to-date with her eye exams and foot checks. The patient's cholesterol levels are also well-controlled on atorvastatin. Despite the recent health challenges, the patient is gradually returning to her regular activities, including gym workouts.      Pharmacotherapy for weight loss: She is currently taking Metformin (off label use for incretin effect and / or insulin resistance and / or diabetes prevention) with adequate clinical response  and without side effects. and Ozempic with diabetes as the primary indication with adequate clinical response  and without side effects..    ASSESSMENT AND PLAN  TREATMENT PLAN FOR OBESITY:  Recommended Dietary Goals  [name] is currently in the action stage of change. As such, her goal is to continue weight management plan. She has agreed to: continue current plan  Behavioral Intervention  We discussed the following Behavioral Modification Strategies today: continue to work on maintaining a reduced calorie state, getting the recommended amount of protein, incorporating whole foods, making healthy choices, staying well hydrated and practicing mindfulness when eating..  Additional resources provided today: None  Recommended Physical Activity Goals  [name] has been advised to work up to 150 minutes of moderate intensity aerobic activity a week and strengthening exercises 2-3 times per week for cardiovascular health, weight loss  maintenance and preservation of muscle mass.   She has agreed to :  Think about enjoyable ways to increase daily physical activity and overcoming barriers to exercise and Increase physical activity in their day and reduce sedentary time (increase NEAT).  Pharmacotherapy We discussed various medication options to help [name] with her weight loss efforts and we both agreed to : continue current anti-obesity medication regimen  ASSOCIATED CONDITIONS ADDRESSED TODAY  Assessment and Plan    Type 2 Diabetes   Her Type 2 Diabetes is well controlled with Metformin XR 500mg  daily and Semaglutide 2mg  weekly, with the last A1c at 6.1 in July and no hypoglycemic symptoms reported. We will continue the current medications and encourage a balanced diet focusing on protein, fruits, and vegetables while limiting simple carbohydrates.  Weight Management /obesity She has experienced gradual weight loss from 207lbs in December 2022 to 173lbs currently, with a recent slowdown possibly due to decreased physical activity and dietary changes from illness . We will encourage the resumption of physical activity as tolerated, continue the balanced diet, and consider repeating the metabolism test if weight loss continues to slow.  Hyperlipidemia   Her Hyperlipidemia is well controlled with Atorvastatin. We will continue the current medication.  Sleep Apnea   She has been non-compliant with CPAP use due to recent illnesses and a head injury. We will encourage the resumption of CPAP use as tolerated and arrange for her to pick up new CPAP supplies.  Recent Concussion   She had a significant fall resulting in a concussion last month, reporting symptoms of dizziness and short-term memory loss. We will encourage a gradual resumption of normal activities as tolerated.  General Health Maintenance   She is up to date with her eye exam, will continue annual foot exams, and has a flu shot scheduled for next week.         Familial hypercholesterolemia  Type 2 diabetes mellitus with obesity (HCC) -     Semaglutide (2 MG/DOSE); Inject 2 mg as directed once a week.  Dispense: 3 mL; Refill: 0  Type 2 diabetes mellitus with other specified complication, without long-term current use of insulin (HCC)  OSA on CPAP  Class 2 obesity with current BMI 30  Other orders -     metFORMIN HCl ER; Take 1 tablet (500 mg total) by mouth daily with breakfast.  Dispense: 90 tablet; Refill: 0    PHYSICAL EXAM:  Blood pressure 129/81, pulse 99, temperature 98 F (36.7 C), height 5\' 3"  (1.6 m), weight 173 lb (78.5 kg). Body mass index is 30.65 kg/m.  General: She is overweight, cooperative, alert, well developed, and in no acute distress. PSYCH: Has normal mood, affect and thought process.   HEENT: EOMI, sclerae are anicteric. Lungs: Normal breathing effort, no conversational dyspnea. Extremities: No edema.  Neurologic: No gross sensory or motor deficits. No tremors or fasciculations noted.    DIAGNOSTIC DATA REVIEWED:  BMET    Component Value Date/Time   NA 138 03/11/2023 0845   K 4.6 03/11/2023 0845   CL 100 03/11/2023 0845   CO2 24 03/11/2023 0845   GLUCOSE 83 03/11/2023 0845   GLUCOSE 100 (H) 07/06/2022 0944   BUN 9 03/11/2023 0845   CREATININE 0.57 03/11/2023 0845   CALCIUM 9.6 03/11/2023 0845  GFRNONAA >60 07/06/2022 0944   GFRAA 119 05/11/2020 0935   Lab Results  Component Value Date   HGBA1C 6.1 (H) 03/11/2023   HGBA1C 6.5 (H) 04/02/2018   Lab Results  Component Value Date   INSULIN 6.1 03/11/2023   INSULIN 5.8 11/21/2021   Lab Results  Component Value Date   TSH 0.976 11/21/2021   CBC    Component Value Date/Time   WBC 7.4 03/11/2023 0915   WBC 7.3 07/06/2022 0944   RBC 4.53 03/11/2023 0915   HGB 12.9 03/11/2023 0915   HGB 11.9 06/25/2022 1420   HCT 39.1 03/11/2023 0915   HCT 38.5 06/25/2022 1420   PLT 304 03/11/2023 0915   PLT 398 06/25/2022 1420   MCV 86.3  03/11/2023 0915   MCV 81 06/25/2022 1420   MCH 28.5 03/11/2023 0915   MCHC 33.0 03/11/2023 0915   RDW 14.6 03/11/2023 0915   RDW 13.5 06/25/2022 1420   Iron Studies    Component Value Date/Time   IRON 98 03/11/2023 0915   IRON 24 (L) 06/25/2022 1420   TIBC 360 03/11/2023 0915   TIBC 351 06/25/2022 1420   FERRITIN 195 03/11/2023 0916   FERRITIN 81 06/25/2022 1420   IRONPCTSAT 27 03/11/2023 0915   IRONPCTSAT 7 (LL) 06/25/2022 1420   Lipid Panel     Component Value Date/Time   CHOL 144 09/04/2022 1027   TRIG 52 09/04/2022 1027   HDL 70 09/04/2022 1027   CHOLHDL 2.7 05/02/2021 1158   CHOLHDL 2.9 04/02/2018 1115   VLDL 51 (H) 04/02/2018 1115   LDLCALC 63 09/04/2022 1027   Hepatic Function Panel     Component Value Date/Time   PROT 6.5 03/11/2023 0845   ALBUMIN 4.4 03/11/2023 0845   AST 16 03/11/2023 0845   ALT 26 03/11/2023 0845   ALKPHOS 100 03/11/2023 0845   BILITOT 0.3 03/11/2023 0845      Component Value Date/Time   TSH 0.976 11/21/2021 1007   Nutritional Lab Results  Component Value Date   VD25OH 78.3 03/11/2023   VD25OH 54.3 09/04/2022   VD25OH 34.2 11/21/2021     Return in about 4 weeks (around 08/06/2023) for For Weight Mangement with Dr. Rikki Spearing.Marland Kitchen She was informed of the importance of frequent follow up visits to maximize her success with intensive lifestyle modifications for her multiple health conditions.   ATTESTASTION STATEMENTS:  Reviewed by clinician on day of visit: allergies, medications, problem list, medical history, surgical history, family history, social history, and previous encounter notes.     Worthy Rancher, MD

## 2023-07-10 ENCOUNTER — Other Ambulatory Visit: Payer: Self-pay | Admitting: Internal Medicine

## 2023-07-10 DIAGNOSIS — Z1231 Encounter for screening mammogram for malignant neoplasm of breast: Secondary | ICD-10-CM

## 2023-07-14 ENCOUNTER — Ambulatory Visit: Payer: MEDICAID | Admitting: Student

## 2023-07-14 ENCOUNTER — Other Ambulatory Visit: Payer: Self-pay

## 2023-07-14 VITALS — BP 124/77 | HR 99 | Ht 63.0 in | Wt 178.0 lb

## 2023-07-14 DIAGNOSIS — E1169 Type 2 diabetes mellitus with other specified complication: Secondary | ICD-10-CM | POA: Diagnosis not present

## 2023-07-14 DIAGNOSIS — Z7984 Long term (current) use of oral hypoglycemic drugs: Secondary | ICD-10-CM | POA: Diagnosis not present

## 2023-07-14 DIAGNOSIS — Z0000 Encounter for general adult medical examination without abnormal findings: Secondary | ICD-10-CM

## 2023-07-14 DIAGNOSIS — Z7985 Long-term (current) use of injectable non-insulin antidiabetic drugs: Secondary | ICD-10-CM | POA: Diagnosis not present

## 2023-07-14 DIAGNOSIS — Z23 Encounter for immunization: Secondary | ICD-10-CM | POA: Diagnosis not present

## 2023-07-14 LAB — POCT GLYCOSYLATED HEMOGLOBIN (HGB A1C): Hemoglobin A1C: 5.9 % — AB (ref 4.0–5.6)

## 2023-07-14 LAB — GLUCOSE, CAPILLARY: Glucose-Capillary: 108 mg/dL — ABNORMAL HIGH (ref 70–99)

## 2023-07-14 MED ORDER — COVID-19 MRNA VAC-TRIS(PFIZER) 30 MCG/0.3ML IM SUSY
0.3000 mL | PREFILLED_SYRINGE | Freq: Once | INTRAMUSCULAR | 0 refills | Status: AC
  Filled 2023-07-14: qty 0.3, 1d supply, fill #0

## 2023-07-14 NOTE — Patient Instructions (Addendum)
Thank you, Ms.[name] for allowing [place] to provide your care today. Today we discussed diabetes and health screening.  Your A1c today is 5.9.   I have ordered the following labs for you:   Lab Orders         Glucose, capillary         POC Hbg A1C      We are not making any changes to your medication regimen at this time.  Follow up:3 months  Should you have any questions or concerns please call the internal medicine clinic at [phone].     Faith Rogue, D.O. [facility]

## 2023-07-14 NOTE — Assessment & Plan Note (Signed)
Has a history of type 2 diabetes mellitus that is well-controlled.  Her A1c today is 5.9.  She is on a current regimen of 500 mg daily and Ozempic 2 mg weekly subcutaneous injection.  She is currently followed by the healthy weight clinic and was seen by her ophthalmologist 2 weeks prior. Plan: -Continue Ozempic 2 mg weekly -Will continue metformin 500 mg daily, will recheck A1c in 3 months and if her A1c is less than 6 we will consider discontinuing metformin.

## 2023-07-14 NOTE — Assessment & Plan Note (Signed)
Influenza vaccine administered today.  We discussed indications for Pap smear, patient has a history of a total abdominal hysterectomy in 1997 for abnormal uterine bleeding.  Patient was notified that she does not need a Pap smear due to total abdominal hysterectomy.  Patient was evaluated by gynecology in 2022.

## 2023-07-14 NOTE — Progress Notes (Signed)
Established Patient Office Visit  Subjective   Patient ID: Meghan Wilson, female    DOB: 06/07/1961  Age: 62 y.o. MRN: [id number]  Chief Complaint  Patient presents with   Diabetes   Flu Vaccine   Meghan Wilson is a 62 year old female who presents to the clinic for a follow-up of diabetes mellitus. Please see problem based assessment and plan for additional details.   Past Medical History:  Diagnosis Date   Anemia    Anxiety    Arthritis    Back pain    Bipolar depression (HCC) 09/2015   Chronic knee pain after total replacement of right knee joint 10/02/2021   Constipation    Depression 06/27/2021   Dizziness    Generalized abdominal discomfort 05/06/2021   History of colon polyps    History of kidney stones    History of patellar fracture 2016   History of tibial fracture 2016   Hyperlipidemia    IBS (irritable bowel syndrome)    Joint pain    Night terrors    Night terrors, adult 09/2015   OSA (obstructive sleep apnea) 04/07/2022   Osteoarthritis    Pain in joint of right shoulder 09/16/2021   Pain of right knee after injury 03/10/2018   Post concussion syndrome 12/20/2019   Pre-diabetes    S/P hardware removal, right tibia 09/17/2021   S/P total knee arthroplasty, 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      Objective:     BP 124/77 (BP Location: Right Arm, Patient Position: Sitting, Cuff Size: Normal)   Pulse 99   Ht 5\' 3"  (1.6 m)   Wt 178 lb (80.7 kg)   SpO2 100%   BMI 31.53 kg/m  BP Readings from Last 3 Encounters:  07/14/23 124/77  07/09/23 129/81  07/03/23 (!) 153/95      Physical Exam Vitals and nursing note reviewed.  Constitutional:      General: She is not in acute distress.     Appearance: She is obese. She is not ill-appearing, toxic-appearing or diaphoretic.  HENT:     Head: Normocephalic.  Cardiovascular:     Rate and Rhythm: Normal rate and regular rhythm.  Pulmonary:     Effort: Pulmonary effort is normal. No respiratory distress.     Breath sounds: Normal breath sounds.  Abdominal:     General: Bowel sounds are normal.     Palpations: Abdomen is soft.     Tenderness: There is no abdominal tenderness.  Musculoskeletal:     Right lower leg: No edema.     Left lower leg: No edema.  Skin:    General: Skin is warm and dry.  Neurological:     Mental Status: She is alert.  Psychiatric:        Mood and Affect: Mood normal.      Results for orders placed or performed in visit on 07/14/23  Glucose, capillary  Result Value Ref Range   Glucose-Capillary 108 (H) 70 - 99 mg/dL  POC Hbg G6Y  Result Value Ref Range   Hemoglobin A1C 5.9 (A) 4.0 - 5.6 %   HbA1c POC (<> result, manual entry)     HbA1c, POC (prediabetic range)  HbA1c, POC (controlled diabetic range)      Last CBC Lab Results  Component Value Date   WBC 7.4 03/11/2023   HGB 12.9 03/11/2023   HCT 39.1 03/11/2023   MCV 86.3 03/11/2023   MCH 28.5 03/11/2023   RDW 14.6 03/11/2023   PLT 304 03/11/2023   Last metabolic panel Lab Results  Component Value Date   GLUCOSE 83 03/11/2023   NA 138 03/11/2023   K 4.6 03/11/2023   CL 100 03/11/2023   CO2 24 03/11/2023   BUN 9 03/11/2023   CREATININE 0.57 03/11/2023   EGFR 102 03/11/2023   CALCIUM 9.6 03/11/2023   PROT 6.5 03/11/2023   ALBUMIN 4.4 03/11/2023   LABGLOB 2.1 03/11/2023   AGRATIO 1.9 09/04/2022   BILITOT 0.3 03/11/2023   ALKPHOS 100 03/11/2023   AST 16 03/11/2023   ALT 26 03/11/2023   ANIONGAP 7 07/06/2022   Last hemoglobin A1c Lab Results  Component Value Date   HGBA1C 5.9 (A) 07/14/2023      The 10-year ASCVD risk score (Arnett DK, et al., 2019) is: 9.8%    Assessment & Plan:   Problem List Items  Addressed This Visit       Endocrine   Type II diabetes mellitus (HCC) - Primary    Has a history of type 2 diabetes mellitus that is well-controlled.  Her A1c today is 5.9.  She is on a current regimen of 500 mg daily and Ozempic 2 mg weekly subcutaneous injection.  She is currently followed by the healthy weight clinic and was seen by her ophthalmologist 2 weeks prior. Plan: -Continue Ozempic 2 mg weekly -Will continue metformin 500 mg daily, will recheck A1c in 3 months and if her A1c is less than 6 we will consider discontinuing metformin.      Relevant Orders   POC Hbg A1C (Completed)     Other   Healthcare maintenance    Influenza vaccine administered today.  We discussed indications for Pap smear, patient has a history of a total abdominal hysterectomy in 1997 for abnormal uterine bleeding.  Patient was notified that she does not need a Pap smear due to total abdominal hysterectomy.  Patient was evaluated by gynecology in 2022.      Other Visit Diagnoses     Flu vaccine need       Encounter for immunization       Relevant Orders   Flu vaccine trivalent PF, 6mos and older(Flulaval,Afluria,Fluarix,Fluzone) (Completed)          Return in about 3 months (around 10/14/2023) for Diabetes checkup.   Seen with Dr. Elizabeth Sauer, DO

## 2023-07-16 NOTE — Progress Notes (Signed)
Internal Medicine Clinic Attending  I was physically present during the key portions of the resident provided service and participated in the medical decision making of patient's management care. I reviewed pertinent patient test results.  The assessment, diagnosis, and plan were formulated together and I agree with the documentation in the resident's note.  Reymundo Poll, MD

## 2023-07-22 ENCOUNTER — Other Ambulatory Visit (HOSPITAL_COMMUNITY): Payer: Self-pay

## 2023-07-23 ENCOUNTER — Encounter: Payer: Self-pay | Admitting: Dietician

## 2023-08-10 ENCOUNTER — Encounter (INDEPENDENT_AMBULATORY_CARE_PROVIDER_SITE_OTHER): Payer: Self-pay | Admitting: Internal Medicine

## 2023-08-10 ENCOUNTER — Other Ambulatory Visit (HOSPITAL_COMMUNITY): Payer: Self-pay

## 2023-08-10 ENCOUNTER — Ambulatory Visit (INDEPENDENT_AMBULATORY_CARE_PROVIDER_SITE_OTHER): Payer: MEDICAID | Admitting: Internal Medicine

## 2023-08-10 VITALS — BP 130/77 | HR 98 | Temp 97.3°F | Ht 63.0 in | Wt 172.0 lb

## 2023-08-10 DIAGNOSIS — E669 Obesity, unspecified: Secondary | ICD-10-CM | POA: Diagnosis not present

## 2023-08-10 DIAGNOSIS — E1169 Type 2 diabetes mellitus with other specified complication: Secondary | ICD-10-CM

## 2023-08-10 DIAGNOSIS — Z6830 Body mass index (BMI) 30.0-30.9, adult: Secondary | ICD-10-CM

## 2023-08-10 DIAGNOSIS — Z7984 Long term (current) use of oral hypoglycemic drugs: Secondary | ICD-10-CM

## 2023-08-10 DIAGNOSIS — E785 Hyperlipidemia, unspecified: Secondary | ICD-10-CM

## 2023-08-10 DIAGNOSIS — G4733 Obstructive sleep apnea (adult) (pediatric): Secondary | ICD-10-CM

## 2023-08-10 DIAGNOSIS — I10 Essential (primary) hypertension: Secondary | ICD-10-CM

## 2023-08-10 MED ORDER — SEMAGLUTIDE (2 MG/DOSE) 8 MG/3ML ~~LOC~~ SOPN
2.0000 mg | PEN_INJECTOR | SUBCUTANEOUS | 2 refills | Status: DC
  Filled 2023-08-10 – 2023-08-31 (×2): qty 3, 28d supply, fill #0

## 2023-08-10 NOTE — Progress Notes (Signed)
Office: [phone]  /  Fax: [phone]  Weight Summary And Biometrics  Vitals Temp: (!) 97.3 F (36.3 C) BP: 130/77 Pulse Rate: 98 SpO2: 100 %   Anthropometric Measurements Height: 5\' 3"  (1.6 m) Weight: 172 lb (78 kg) BMI (Calculated): 30.48 Weight at Last Visit: 173 lb Weight Lost Since Last Visit: 1 lb Weight Gained Since Last Visit: 0 lb Starting Weight: 295 lb Total Weight Loss (lbs): 23 lb (10.4 kg) Peak Weight: 207 lb   Body Composition  Body Fat %: 43.3 % Fat Mass (lbs): 74.8 lbs Muscle Mass (lbs): 92.8 lbs Total Body Water (lbs): 63.6 lbs Visceral Fat Rating : 11    No data recorded Today's Visit #: 26  Starting Date: 11/02/22   Subjective   Chief Complaint: Obesity  Meghan Wilson is here to discuss her progress with her obesity treatment plan. She is on the the Category 2 Plan and states she is following her eating plan approximately 95 % of the time. She states she is exercising 60 minutes 3 times per week.  Interval History:   Discussed the use of AI scribe software for clinical note transcription with the patient, who gave verbal consent to proceed.  History of Present Illness   Meghan Wilson, a patient with a history of obesity, diabetes, and high cholesterol, presents for a follow-up visit for medical weight management. She reports a recent weight loss of one pound, which she attributes to increased physical activity and dietary changes. She notes that despite the scale not reflecting significant weight loss, she feels a noticeable difference in her clothes and overall body composition. She also reports a decrease in shoe size, suggesting a reduction in peripheral edema.  Meghan Wilson has been adhering to her prescribed medication regimen, which includes Ozempic for weight management and diabetes control, Atorvastatin for cholesterol management, and Metformin. She also takes over-the-counter Vitamin D3 supplements. She reports no new medications.  She has been  engaging in regular physical activity, including various cardio exercises and strength training, for approximately 60 minutes three times a week. She reports an increase in exercise intensity over time and expresses satisfaction with her current routine.  In terms of dietary habits, Meghan Wilson reports a decreased appetite at times, which she manages with protein drinks. She has been consuming a diet rich in protein, including salmon, egg whites, beef, and chicken, and a variety of vegetables. She also reports regular consumption of fruits.  Meghan Wilson's recent lab results show an improvement in her A1C levels, down to 5.9 from a previous 6.1. Her highest recorded A1C was 6.7 in 2020. Her BMI has also decreased from 37 in December 2022 to 30 at the time of the visit. She has lost a total of 35 pounds since December 2022, which is a 17% reduction in her total body weight. Her body fat percentage has also decreased from 48% at its highest to 43%.  Despite the progress, Meghan Wilson is aware that she is taking two medications, Lyrica and Doxepin, which could potentially cause weight gain. However, she has managed to continue losing weight while on these medications. She expresses satisfaction with her progress and is motivated to continue her weight management efforts.       Orexigenic Control:  Denies problems with appetite and hunger signals.  Denies problems with satiety and satiation.  Denies problems with eating patterns and portion control.  Denies abnormal cravings. Denies feeling deprived or restricted.   Barriers identified: none.   Pharmacotherapy for weight loss: She is currently taking  Metformin (off label use for incretin effect and / or insulin resistance and / or diabetes prevention) with adequate clinical response  and without side effects. and Ozempic with diabetes as the primary indication with adequate clinical response  and without side effects..   Assessment and Plan   Treatment Plan For  Obesity:  Recommended Dietary Goals  Meghan Wilson is currently in the action stage of change. As such, her goal is to continue weight management plan. She has agreed to: continue current plan  Behavioral Intervention  We discussed the following Behavioral Modification Strategies today: continue to work on maintaining a reduced calorie state, getting the recommended amount of protein, incorporating whole foods, making healthy choices, staying well hydrated and practicing mindfulness when eating..  Additional resources provided today: Handout on cravings   Recommended Physical Activity Goals  Meghan Wilson has been advised to work up to 150 minutes of moderate intensity aerobic activity a week and strengthening exercises 2-3 times per week for cardiovascular health, weight loss maintenance and preservation of muscle mass.   She has agreed to :  continue to gradually increase the amount and intensity of exercise   Pharmacotherapy  We discussed various medication options to help Meghan Wilson with her weight loss efforts and we both agreed to : continue current anti-obesity medication regimen  Associated Conditions Addressed Today  OSA on CPAP  Type 2 diabetes mellitus with obesity (HCC) -     Semaglutide (2 MG/DOSE); Inject 2 mg as directed once a week.  Dispense: 3 mL; Refill: 2  Type 2 diabetes mellitus with other specified complication, without long-term current use of insulin (HCC)  Obesity: Initial BMI 35    Assessment and Plan    Obesity Meghan Wilson has made significant progress in weight management, reducing her BMI from 37 to 30, achieving a 35-pound weight loss, which is 17% of her total body weight, and decreasing her body fat percentage from 48% to 43%. She has maintained muscle mass and improved body composition through adherence to physical activity and nutritional guidelines. We discussed the potential for weight loss plateaus and emphasized the importance of maintaining muscle mass and  hydration. We noted that 90% of people regain weight and that lifestyle changes alone typically result in a 7-10% weight loss; however, Meghan Wilson has exceeded this with a 17% loss, aided by Ozempic. We explained that weight loss will slow as the body reaches a new equilibrium. We will continue her current exercise regimen of 60 minutes, three times per week, ensure an intake of 30 grams of protein three times a day, send a refill for Ozempic with two refills, and provide a handout on cravings.  Type 2 Diabetes Mellitus Meghan Wilson's A1c has improved from 6.7% to 5.9%, indicating good control. The goal is to reduce A1c to 5.7% or less for remission. Her current medications, metformin and Ozempic, are effective in managing weight and blood glucose levels.  She is not having any symptoms of hypoglycemia.  We will continue metformin and Ozempic and monitor A1c levels.  Hypertension Meghan Wilson's blood pressure is well-controlled at 130/77 mmHg with current management. We will continue current management.  Hyperlipidemia Atorvastatin is effective in lowering Meghan Wilson's cardiovascular risk. We will continue atorvastatin.  General Health Maintenance Meghan Wilson is taking over-the-counter vitamin D3; high-dose vitamin D was discontinued as it is no longer needed. We will continue over-the-counter vitamin D3.  Follow-up We will follow up in four weeks.       Objective   Physical Exam:  Blood pressure 130/77, pulse  98, temperature (!) 97.3 F (36.3 C), height 5\' 3"  (1.6 m), weight 172 lb (78 kg), SpO2 100%. Body mass index is 30.47 kg/m.  General: She is overweight, cooperative, alert, well developed, and in no acute distress. PSYCH: Has normal mood, affect and thought process.   HEENT: EOMI, sclerae are anicteric. Lungs: Normal breathing effort, no conversational dyspnea. Extremities: No edema.  Neurologic: No gross sensory or motor deficits. No tremors or fasciculations noted.    Diagnostic Data  Reviewed:  BMET    Component Value Date/Time   NA 138 03/11/2023 0845   K 4.6 03/11/2023 0845   CL 100 03/11/2023 0845   CO2 24 03/11/2023 0845   GLUCOSE 83 03/11/2023 0845   GLUCOSE 100 (H) 07/06/2022 0944   BUN 9 03/11/2023 0845   CREATININE 0.57 03/11/2023 0845   CALCIUM 9.6 03/11/2023 0845   GFRNONAA >60 07/06/2022 0944   GFRAA 119 05/11/2020 0935   Lab Results  Component Value Date   HGBA1C 5.9 (A) 07/14/2023   HGBA1C 6.5 (H) 04/02/2018   Lab Results  Component Value Date   INSULIN 6.1 03/11/2023   INSULIN 5.8 11/21/2021   Lab Results  Component Value Date   TSH 0.976 11/21/2021   CBC    Component Value Date/Time   WBC 7.4 03/11/2023 0915   WBC 7.3 07/06/2022 0944   RBC 4.53 03/11/2023 0915   HGB 12.9 03/11/2023 0915   HGB 11.9 06/25/2022 1420   HCT 39.1 03/11/2023 0915   HCT 38.5 06/25/2022 1420   PLT 304 03/11/2023 0915   PLT 398 06/25/2022 1420   MCV 86.3 03/11/2023 0915   MCV 81 06/25/2022 1420   MCH 28.5 03/11/2023 0915   MCHC 33.0 03/11/2023 0915   RDW 14.6 03/11/2023 0915   RDW 13.5 06/25/2022 1420   Iron Studies    Component Value Date/Time   IRON 98 03/11/2023 0915   IRON 24 (L) 06/25/2022 1420   TIBC 360 03/11/2023 0915   TIBC 351 06/25/2022 1420   FERRITIN 195 03/11/2023 0916   FERRITIN 81 06/25/2022 1420   IRONPCTSAT 27 03/11/2023 0915   IRONPCTSAT 7 (LL) 06/25/2022 1420   Lipid Panel     Component Value Date/Time   CHOL 144 09/04/2022 1027   TRIG 52 09/04/2022 1027   HDL 70 09/04/2022 1027   CHOLHDL 2.7 05/02/2021 1158   CHOLHDL 2.9 04/02/2018 1115   VLDL 51 (H) 04/02/2018 1115   LDLCALC 63 09/04/2022 1027   Hepatic Function Panel     Component Value Date/Time   PROT 6.5 03/11/2023 0845   ALBUMIN 4.4 03/11/2023 0845   AST 16 03/11/2023 0845   ALT 26 03/11/2023 0845   ALKPHOS 100 03/11/2023 0845   BILITOT 0.3 03/11/2023 0845      Component Value Date/Time   TSH 0.976 11/21/2021 1007   Nutritional Lab Results   Component Value Date   VD25OH 78.3 03/11/2023   VD25OH 54.3 09/04/2022   VD25OH 34.2 11/21/2021    Follow-Up   Return in about 4 weeks (around 09/07/2023) for For Weight Mangement with Dr. Rikki Spearing.Marland Kitchen She was informed of the importance of frequent follow up visits to maximize her success with intensive lifestyle modifications for her multiple health conditions.  Attestation Statement   Reviewed by clinician on day of visit: allergies, medications, problem list, medical history, surgical history, family history, social history, and previous encounter notes.     Worthy Rancher, MD

## 2023-08-31 ENCOUNTER — Other Ambulatory Visit (HOSPITAL_COMMUNITY): Payer: Self-pay

## 2023-08-31 ENCOUNTER — Ambulatory Visit
Admission: RE | Admit: 2023-08-31 | Discharge: 2023-08-31 | Disposition: A | Discharge status: Home / Self Care | Payer: MEDICAID | Source: Ambulatory Visit | Attending: Internal Medicine | Admitting: Internal Medicine

## 2023-08-31 ENCOUNTER — Other Ambulatory Visit (INDEPENDENT_AMBULATORY_CARE_PROVIDER_SITE_OTHER): Payer: Self-pay | Admitting: Internal Medicine

## 2023-08-31 DIAGNOSIS — E1169 Type 2 diabetes mellitus with other specified complication: Secondary | ICD-10-CM

## 2023-08-31 DIAGNOSIS — Z1231 Encounter for screening mammogram for malignant neoplasm of breast: Secondary | ICD-10-CM

## 2023-09-06 NOTE — Assessment & Plan Note (Deleted)
 She has no prior history of anemia until Ortho surgery in 09/2021, anemia resolved after surgery, then recurrent more severe anemia with second surgery in 10/2021 Hgb 7.9 -She started oral iron p.o. once daily 10/21/2021, tolerating well with mild constipation managed with OTC meds -Past GI work-up 12/2020 showed duodenitis and H. Pylori+ which was treated.  No obvious bleeding source on upper endoscopy or colonoscopy (Dr. Adela Lank) -She does not currently have menstrual periods -Recent folic acid, B12, thyroid panel, and iron panel were normal -Her mild anemia is likely related to orthopedic operations and is recovering.   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   -She received 1 dose IV Feraheme in 07/2022 and continues oral iron, tolerated well. IDA resolved with IV and oral iron -Stopped oral iron in 10/2022 -Meghan Wilson is clinically doing well. CBC and iron/TIBC are normal, ferritin also normmal. Likely can remain off oral iron

## 2023-09-06 NOTE — Progress Notes (Deleted)
 Patient Care Team: Fernand Prost, MD as PCP - General (Internal Medicine) Lanny Callander, MD as Attending Physician (Hematology and Oncology) Burton, Lacie K, NP as Nurse Practitioner (Hematology and Oncology)  Clinic Day:  09/06/2023  Referring physician: Fernand Prost, MD  ASSESSMENT & PLAN:   Assessment & Plan: Anemia She has no prior history of anemia until Ortho surgery in 09/2021, anemia resolved after surgery, then recurrent more severe anemia with second surgery in 10/2021 Hgb 7.9 -She started oral iron p.o. once daily 10/21/2021, tolerating well with mild constipation managed with OTC meds -Past GI work-up 12/2020 showed duodenitis and H. Pylori+ which was treated.  No obvious bleeding source on upper endoscopy or colonoscopy (Dr. Leigh) -She does not currently have menstrual periods -Recent folic acid, B12, thyroid panel, and iron panel were normal -Her mild anemia is likely related to orthopedic operations and is recovering.   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   -She received 1 dose IV Feraheme in 07/2022 and continues oral iron, tolerated well. IDA resolved with IV and oral iron -Stopped oral iron in 10/2022 -Meghan Wilson is clinically doing well. CBC and iron/TIBC are normal, ferritin also normmal. Likely can remain off oral iron    The patient understands the plans discussed today and is in agreement with them.  She knows to contact our office if she develops concerns prior to her next appointment.  I provided *** minutes of face-to-face time during this encounter and > 50% was spent counseling as documented under my assessment and plan.    Meghan FORBES Lessen, NP  [facility] - A DEPT OF [facility] [address] [place] [address] Dept: [phone] Dept Fax: [phone]   No orders of the defined types were placed in this encounter.     CHIEF COMPLAINT:   CC: Normocytic anemia  Current Treatment: IV Feraheme as needed  INTERVAL HISTORY:  Meghan Wilson is here today for repeat clinical assessment.  She was last seen by Lacie, NP on 03/11/2023.  Ferritin on that day was 195.  She did not require IV iron.  Asked to stay off oral iron due to stomach upset.  She denies fevers or chills. She denies pain. Her appetite is good. Her weight {Weight change:10426}.  I have reviewed the past medical history, past surgical history, social history and family history with the patient and they are unchanged from previous note.  ALLERGIES:  is allergic to aspirin , depakote [divalproex sodium], ibuprofen, prednisone , and latex.  MEDICATIONS:  Current Outpatient Medications  Medication Sig Dispense Refill   Accu-Chek FastClix Lancets MISC use 1 strip to check blood sugar up to 7 times a week 102 each 3   atorvastatin  (LIPITOR) 80 MG tablet Take 1 tablet (80 mg total) by mouth daily. 30 tablet 11   bacitracin -polymyxin b  (POLYSPORIN ) ophthalmic ointment Apply a small amount into the right eye 3 (three) times daily. 3.5 g 0   cycloSPORINE  (RESTASIS ) 0.05 % ophthalmic emulsion Instill 1 drop into both eyes twice a day 180 each 3   cycloSPORINE  (RESTASIS ) 0.05 % ophthalmic emulsion Place 1 drop into both eyes 2 (two) times daily. 180 each 3   doxepin  (SINEQUAN ) 10 MG capsule Take 1 capsule (10 mg total) by mouth at bedtime as needed for sleep 30 capsule 3   erythromycin  ophthalmic ointment Apply a small amount into the right eye 3 (three) times daily.  3.5 g 0   ferrous sulfate  325 (65 FE) MG tablet Take 325 mg by mouth daily with breakfast.     fluorometholone  (FML) 0.1 % ophthalmic suspension Place 1 drop into the right eye 4 (four) times daily. 5 mL 0   fluticasone  (FLONASE ) 50 MCG/ACT nasal spray Place 1 spray into both nostrils daily as needed for allergies or rhinitis 16 g 2   Glycerin -Hypromellose-PEG 400 (DRY EYE RELIEF DROPS) 0.2-0.2-1 % SOLN Place 1 drop into both  eyes in the morning and at bedtime.     lamoTRIgine  (LAMICTAL ) 100 MG tablet Take 1 tablet by mouth once a day 30 tablet 3   linaclotide  (LINZESS ) 145 MCG CAPS capsule Take 1 capsule (145 mcg total) by mouth daily before breakfast. 30 capsule 11   meloxicam  (MOBIC ) 15 MG tablet Take 1 tablet (15 mg total) by mouth daily. 30 tablet 2   metFORMIN  (GLUCOPHAGE -XR) 500 MG 24 hr tablet Take 1 tablet (500 mg total) by mouth daily with breakfast. 90 tablet 0   ondansetron  (ZOFRAN ) 4 MG tablet Take 1 tablet (4 mg total) by mouth every 6 (six) hours as needed for nausea. 20 tablet 0   polyethylene glycol (MIRALAX  / GLYCOLAX ) 17 g packet Mix 1 packet (17 g total) in 8 oz clear liquid and drink by mouth daily as needed for mild constipation. 14 each 0   prazosin  (MINIPRESS ) 1 MG capsule Take 1 capsule by mouth at bedtime 30 capsule 3   pregabalin  (LYRICA ) 75 MG capsule Take 1 capsule (75 mg total) by mouth at bedtime as needed. (Patient taking differently: Take 75 mg by mouth at bedtime.) 30 capsule 5   Semaglutide , 2 MG/DOSE, 8 MG/3ML SOPN Inject 2 mg as directed once a week. 3 mL 2   triamcinolone  cream (KENALOG ) 0.1 % Apply 1 Application topically daily. Apply underneath breast as needed 45 g 2   triamcinolone  ointment (KENALOG ) 0.5 % Apply 1 Application topically daily as needed. Apply to hands as needed. 30 g 2   No current facility-administered medications for this visit.    HISTORY OF PRESENT ILLNESS:   Oncology History   No history exists.      REVIEW OF SYSTEMS:   Constitutional: Denies fevers, chills or abnormal weight loss Eyes: Denies blurriness of vision Ears, nose, mouth, throat, and face: Denies mucositis or sore throat Respiratory: Denies cough, dyspnea or wheezes Cardiovascular: Denies palpitation, chest discomfort or lower extremity swelling Gastrointestinal:  Denies nausea, heartburn or change in bowel habits Skin: Denies abnormal skin rashes Lymphatics: Denies new  lymphadenopathy or easy bruising Neurological:Denies numbness, tingling or new weaknesses Behavioral/Psych: Mood is stable, no new changes  All other systems were reviewed with the patient and are negative.   VITALS:  There were no vitals taken for this visit.  Wt Readings from Last 3 Encounters:  08/10/23 172 lb (78 kg)  07/14/23 178 lb (80.7 kg)  07/09/23 173 lb (78.5 kg)    There is no height or weight on file to calculate BMI.  Performance status (ECOG): {CHL ONC [id number]  PHYSICAL EXAM:   GENERAL:alert, no distress and comfortable SKIN: skin color, texture, turgor are normal, no rashes or significant lesions EYES: normal, Conjunctiva are pink and non-injected, sclera clear OROPHARYNX:no exudate, no erythema and lips, buccal mucosa, and tongue normal  NECK: supple, thyroid normal size, non-tender, without nodularity LYMPH:  no palpable lymphadenopathy in the cervical, axillary or inguinal LUNGS: clear to auscultation and percussion with normal breathing effort HEART: regular  rate & rhythm and no murmurs and no lower extremity edema ABDOMEN:abdomen soft, non-tender and normal bowel sounds Musculoskeletal:no cyanosis of digits and no clubbing  NEURO: alert & oriented x 3 with fluent speech, no focal motor/sensory deficits  LABORATORY DATA:  I have reviewed the data as listed    Component Value Date/Time   NA 138 03/11/2023 0845   K 4.6 03/11/2023 0845   CL 100 03/11/2023 0845   CO2 24 03/11/2023 0845   GLUCOSE 83 03/11/2023 0845   GLUCOSE 100 (H) 07/06/2022 0944   BUN 9 03/11/2023 0845   CREATININE 0.57 03/11/2023 0845   CALCIUM  9.6 03/11/2023 0845   PROT 6.5 03/11/2023 0845   ALBUMIN  4.4 03/11/2023 0845   AST 16 03/11/2023 0845   ALT 26 03/11/2023 0845   ALKPHOS 100 03/11/2023 0845   BILITOT 0.3 03/11/2023 0845   GFRNONAA >60 07/06/2022 0944   GFRAA 119 05/11/2020 0935    No results found for: SPEP, UPEP  Lab Results  Component Value Date   WBC  7.4 03/11/2023   NEUTROABS 4.3 03/11/2023   HGB 12.9 03/11/2023   HCT 39.1 03/11/2023   MCV 86.3 03/11/2023   PLT 304 03/11/2023      Chemistry      Component Value Date/Time   NA 138 03/11/2023 0845   K 4.6 03/11/2023 0845   CL 100 03/11/2023 0845   CO2 24 03/11/2023 0845   BUN 9 03/11/2023 0845   CREATININE 0.57 03/11/2023 0845      Component Value Date/Time   CALCIUM  9.6 03/11/2023 0845   ALKPHOS 100 03/11/2023 0845   AST 16 03/11/2023 0845   ALT 26 03/11/2023 0845   BILITOT 0.3 03/11/2023 0845       RADIOGRAPHIC STUDIES: I have personally reviewed the radiological images as listed and agreed with the findings in the report. MM 3D SCREENING MAMMOGRAM BILATERAL BREAST Result Date: 09/04/2023 CLINICAL DATA:  Screening. EXAM: DIGITAL SCREENING BILATERAL MAMMOGRAM WITH TOMOSYNTHESIS AND CAD TECHNIQUE: Bilateral screening digital craniocaudal and mediolateral oblique mammograms were obtained. Bilateral screening digital breast tomosynthesis was performed. The images were evaluated with computer-aided detection. COMPARISON:  Previous exam(s). ACR Breast Density Category a: The breasts are almost entirely fatty. FINDINGS: There are no findings suspicious for malignancy. IMPRESSION: No mammographic evidence of malignancy. A result letter of this screening mammogram will be mailed directly to the patient. RECOMMENDATION: Screening mammogram in one year. (Code:[id number]-01Y) BI-RADS CATEGORY  1: Negative. Electronically Signed   By: Alm Parkins M.D.   On: 09/04/2023 12:31

## 2023-09-07 ENCOUNTER — Telehealth: Payer: Self-pay | Admitting: Nurse Practitioner

## 2023-09-07 ENCOUNTER — Inpatient Hospital Stay: Payer: MEDICAID

## 2023-09-07 ENCOUNTER — Other Ambulatory Visit (HOSPITAL_COMMUNITY): Payer: Self-pay

## 2023-09-07 ENCOUNTER — Inpatient Hospital Stay: Payer: MEDICAID | Admitting: Nurse Practitioner

## 2023-09-07 ENCOUNTER — Other Ambulatory Visit: Payer: Self-pay

## 2023-09-07 ENCOUNTER — Other Ambulatory Visit: Payer: Self-pay | Admitting: Internal Medicine

## 2023-09-07 DIAGNOSIS — T7840XD Allergy, unspecified, subsequent encounter: Secondary | ICD-10-CM

## 2023-09-07 DIAGNOSIS — D649 Anemia, unspecified: Secondary | ICD-10-CM

## 2023-09-07 DIAGNOSIS — S0031XA Abrasion of nose, initial encounter: Secondary | ICD-10-CM

## 2023-09-07 MED ORDER — FLUTICASONE PROPIONATE 50 MCG/ACT NA SUSP
1.0000 | NASAL | 2 refills | Status: DC
  Filled 2023-09-07: qty 16, 30d supply, fill #0
  Filled 2024-07-20: qty 16, 30d supply, fill #1

## 2023-09-07 NOTE — Telephone Encounter (Signed)
Patient is aware of rescheduled appointment times/dates 

## 2023-09-10 ENCOUNTER — Ambulatory Visit: Payer: MEDICAID | Admitting: Student

## 2023-09-10 ENCOUNTER — Other Ambulatory Visit: Payer: Self-pay

## 2023-09-10 ENCOUNTER — Encounter: Payer: Self-pay | Admitting: Student

## 2023-09-10 ENCOUNTER — Other Ambulatory Visit (HOSPITAL_COMMUNITY): Payer: Self-pay

## 2023-09-10 VITALS — BP 127/73 | HR 90 | Temp 98.0°F | Ht 63.0 in | Wt 180.0 lb

## 2023-09-10 DIAGNOSIS — E1169 Type 2 diabetes mellitus with other specified complication: Secondary | ICD-10-CM

## 2023-09-10 DIAGNOSIS — Z6831 Body mass index (BMI) 31.0-31.9, adult: Secondary | ICD-10-CM

## 2023-09-10 DIAGNOSIS — E669 Obesity, unspecified: Secondary | ICD-10-CM

## 2023-09-10 DIAGNOSIS — G4733 Obstructive sleep apnea (adult) (pediatric): Secondary | ICD-10-CM

## 2023-09-10 DIAGNOSIS — E66812 Obesity, class 2: Secondary | ICD-10-CM

## 2023-09-10 DIAGNOSIS — Z7984 Long term (current) use of oral hypoglycemic drugs: Secondary | ICD-10-CM

## 2023-09-10 DIAGNOSIS — Z7985 Long-term (current) use of injectable non-insulin antidiabetic drugs: Secondary | ICD-10-CM

## 2023-09-10 LAB — POCT GLYCOSYLATED HEMOGLOBIN (HGB A1C): Hemoglobin A1C: 5.5 % (ref 4.0–5.6)

## 2023-09-10 LAB — GLUCOSE, CAPILLARY: Glucose-Capillary: 90 mg/dL (ref 70–99)

## 2023-09-10 MED ORDER — ZOSTER VAC RECOMB ADJUVANTED 50 MCG/0.5ML IM SUSR
0.5000 mL | Freq: Once | INTRAMUSCULAR | 0 refills | Status: AC
  Filled 2023-09-10: qty 0.5, 1d supply, fill #0

## 2023-09-10 MED ORDER — DOXEPIN HCL 10 MG PO CAPS
10.0000 mg | ORAL_CAPSULE | Freq: Every evening | ORAL | 3 refills | Status: DC | PRN
  Filled 2023-09-10: qty 30, 30d supply, fill #0
  Filled 2023-11-23: qty 30, 30d supply, fill #1

## 2023-09-10 MED ORDER — PRAZOSIN HCL 1 MG PO CAPS
1.0000 mg | ORAL_CAPSULE | Freq: Every day | ORAL | 3 refills | Status: DC
  Filled 2023-09-10: qty 30, 30d supply, fill #0

## 2023-09-10 MED ORDER — LAMOTRIGINE 100 MG PO TABS
100.0000 mg | ORAL_TABLET | Freq: Every day | ORAL | 3 refills | Status: DC
  Filled 2023-09-10 – 2023-10-01 (×2): qty 30, 30d supply, fill #0
  Filled 2023-11-25: qty 30, 30d supply, fill #1
  Filled 2024-01-21 (×2): qty 30, 30d supply, fill #2
  Filled 2024-03-16: qty 30, 30d supply, fill #3

## 2023-09-10 NOTE — Assessment & Plan Note (Signed)
 Continues to be adherent to cpap therapy. Just had new equipment sent this week

## 2023-09-10 NOTE — Progress Notes (Signed)
 Subjective:  CC: diabetes follow up  HPI:  [name] is a 63 y.o. female with a past medical history stated below and presents today for diabetes follow up. Please see problem based assessment and plan for additional details.  Past Medical History:  Diagnosis Date   Anemia    Anxiety    Arthritis    Back pain    Bipolar depression (HCC) 09/2015   Chronic knee pain after total replacement of right knee joint 10/02/2021   Constipation    Depression 06/27/2021   Dizziness    Generalized abdominal discomfort 05/06/2021   History of colon polyps    History of kidney stones    History of patellar fracture 2016   History of tibial fracture 2016   Hyperlipidemia    IBS (irritable bowel syndrome)    Joint pain    Night terrors    Night terrors, adult 09/2015   OSA (obstructive sleep apnea) 04/07/2022   Osteoarthritis    Pain in joint of right shoulder 09/16/2021   Pain of right knee after injury 03/10/2018   Post concussion syndrome 12/20/2019   Pre-diabetes    S/P hardware removal, right tibia 09/17/2021   S/P total knee arthroplasty, 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 30 tablet 11   bacitracin -polymyxin b  (POLYSPORIN ) ophthalmic ointment Apply a small amount into the right eye 3 (three) times daily. 3.5 g 0   cycloSPORINE  (RESTASIS ) 0.05 % ophthalmic emulsion Instill 1 drop into both eyes twice a day 180 each 3   cycloSPORINE  (RESTASIS ) 0.05 % ophthalmic emulsion Place 1 drop into both eyes 2 (two) times daily. 180 each 3   doxepin   (SINEQUAN ) 10 MG capsule Take 1 capsule (10 mg total) by mouth at bedtime as needed for sleep 30 capsule 3   erythromycin  ophthalmic ointment Apply a small amount into the right eye 3 (three) times daily. 3.5 g 0   ferrous sulfate  325 (65 FE) MG tablet Take 325 mg by mouth daily with breakfast.     fluorometholone  (FML) 0.1 % ophthalmic suspension Place 1 drop into the right eye 4 (four) times daily. 5 mL 0   fluticasone  (FLONASE ) 50 MCG/ACT nasal spray Place 1 spray into both nostrils daily as needed for allergies or rhinitis 16 g 2   Glycerin -Hypromellose-PEG 400 (DRY EYE RELIEF DROPS) 0.2-0.2-1 % SOLN Place 1 drop into both eyes in the morning and at bedtime.     lamoTRIgine  (LAMICTAL ) 100 MG tablet Take 1 tablet by mouth once a day 30 tablet 3   linaclotide  (LINZESS ) 145 MCG CAPS capsule Take 1 capsule (145 mcg total) by mouth daily before breakfast. 30 capsule 11   meloxicam  (MOBIC ) 15 MG tablet Take 1 tablet (15 mg total) by mouth daily. 30 tablet 2   ondansetron  (ZOFRAN ) 4 MG tablet Take 1 tablet (4 mg total) by mouth every 6 (six) hours as needed for nausea. 20 tablet 0   polyethylene glycol (MIRALAX  / GLYCOLAX )  17 g packet Mix 1 packet (17 g total) in 8 oz clear liquid and drink by mouth daily as needed for mild constipation. 14 each 0   prazosin  (MINIPRESS ) 1 MG capsule Take 1 capsule by mouth at bedtime 30 capsule 3   pregabalin  (LYRICA ) 75 MG capsule Take 1 capsule (75 mg total) by mouth at bedtime as needed. (Patient taking differently: Take 75 mg by mouth at bedtime.) 30 capsule 5   Semaglutide , 2 MG/DOSE, 8 MG/3ML SOPN Inject 2 mg as directed once a week. 3 mL 2   triamcinolone  cream (KENALOG ) 0.1 % Apply 1 Application topically daily. Apply underneath breast as needed 45 g 2   triamcinolone  ointment (KENALOG ) 0.5 % Apply 1 Application topically daily as needed. Apply to hands as needed. 30 g 2   No current facility-administered medications on file prior to visit.    Family History   Problem Relation Age of Onset   Hyperlipidemia Mother    Hypertension Mother    Colon polyps Mother    Heart disease Mother    Diabetes Mother    Depression Mother    Anxiety disorder Mother    Bipolar disorder Mother    Eating disorder Mother    Obesity Mother    Arthritis/Rheumatoid Father    Hyperlipidemia Maternal Aunt    Hypertension Maternal Aunt    Cancer Maternal Aunt        blood cancer   Colon polyps Maternal Grandmother    Stroke Other    Colon cancer Neg Hx    Esophageal cancer Neg Hx    Stomach cancer Neg Hx    Rectal cancer Neg Hx    Sleep apnea Neg Hx    Breast cancer Neg Hx     Social History   Socioeconomic History   Marital status: Single    Spouse name: Not on file   Number of children: 2   Years of education: 2 years of college   Highest education level: Not on file  Occupational History   Not on file  Tobacco Use   Smoking status: Former    Current packs/day: 0.00    Average packs/day: 0.3 packs/day for 7.0 years (1.8 ttl pk-yrs)    Types: Cigarettes    Start date: 12/22/2010    Quit date: 12/21/2017    Years since quitting: 5.7   Smokeless tobacco: Never  Vaping Use   Vaping status: Never Used  Substance and Sexual Activity   Alcohol  use: Yes    Comment: special occasions   Drug use: No   Sexual activity: Yes    Birth control/protection: Surgical    Comment: Celibate  Other Topics Concern   Not on file  Social History Narrative   Lives at home with her fiance.   Right-handed.   No daily use of caffeine.   Social Drivers of Corporate Investment Banker Strain: Not on file  Food Insecurity: No Food Insecurity (09/25/2022)   Hunger Vital Sign    Worried About Running Out of Food in the Last Year: Never true    Ran Out of Food in the Last Year: Never true  Transportation Needs: No Transportation Needs (05/14/2022)   PRAPARE - Administrator, Civil Service (Medical): No    Lack of Transportation (Non-Medical): No   Physical Activity: Not on file  Stress: Not on file  Social Connections: Moderately Isolated (09/25/2022)   Social Connection and Isolation Panel [NHANES]    Frequency of Communication  with Friends and Family: More than three times a week    Frequency of Social Gatherings with Friends and Family: More than three times a week    Attends Religious Services: More than 4 times per year    Active Member of [employer] or Organizations: Not on file    Attends Banker Meetings: Never    Marital Status: Divorced  Catering Manager Violence: Not At Risk (09/25/2022)   Humiliation, Afraid, Rape, and Kick questionnaire    Fear of Current or Ex-Partner: No    Emotionally Abused: No    Physically Abused: No    Sexually Abused: No    Review of Systems: ROS negative except for what is noted on the assessment and plan.  Objective:   Vitals:   09/10/23 1334  BP: 127/73  Pulse: 90  Temp: 98 F (36.7 C)  TempSrc: Oral  SpO2: 100%  Weight: 180 lb (81.6 kg)  Height: 5' 3 (1.6 m)    Physical Exam: Constitutional: well-appearing woman sitting in chair, in no acute distress HENT: normocephalic atraumatic, mucous membranes moist Neck: supple Cardiovascular: regular rate and rhythm, no m/r/g Pulmonary/Chest: normal work of breathing on room air, lungs clear to auscultation bilaterally Abdominal: soft, non-tender, non-distended Neurological: alert & oriented x 3 Skin: warm and dry Psych: pleasant mood and affect     Assessment & Plan:   Type 2 diabetes mellitus with obesity (HCC) Last A1c: Lab Results      Component                Value               Date                      HGBA1C                   5.9 (A)   on 07/14/2023           She is currently on Atorvastatin  80 mg daily  - Last eye exam:  - Foot/podiatr: no recent concerns about calluses or skin ulcers. - Current medications: Metformin  500 mg AM. Patient has has GI side effects with metformin  dosing, specially when taking  it without food without significant improvement with foods.  Ozempic  2 mg weekly without side effects -Denies neuropathy symptoms.  - A1c today 5.5 -Continue Ozempic  2 mg weekly  -repeat screening urine microalbumin cr ratio -A1c in 3 months; if controlled off metformin  and on Ozempic , patient can be assessed every 6 months -Discussed lifestyle modifications  Obesity with current BMI of 31 BMI 37 ->30 ->31. Weight has increased in this office compared to 08/10/2023. Scale differences, clothes and time of day likely contributing. Continues on ozempic  and making dietary and daily movement practice. Denies significant food excursions during the holiday. At home, her early morning weight is ~172, maintained. Has been working out 3x weekly, over an hour of cardiovascular exercise. This stopped the last week of December and early Jan. Will restart - Discussed monitoring trend, especially as she [facility] - Discussed lifestyle modifications and her adherence with the weight management clinic - 150 minutes of moderate intensity aerobic activity a week and strengthening exercises 2-3 times per week   OSA on CPAP Continues to be adherent to cpap therapy. Just had new equipment sent this week    Return in about 3 months (around 12/09/2023) for Diabetes follow up.  Patient discussed  with Dr. Forest Hadassah Kristy Rosario, MD [facility]  09/10/2023, 1:58 PM

## 2023-09-10 NOTE — Patient Instructions (Addendum)
 Thank you, Ms.Latash Matuska for allowing us  to provide your care today. Today we discussed:  How well controlled your diabetes is. Today your A1c was 5.5.   Because of this, we are going to discontinue the Metformin ; again, congratulations.   Pleaase continue wearing your cpap machine and 150 minutes of moderate intensity aerobic activity a week and strengthening exercises 2-3 times per week   If you have not already, please ask about shingles vaccination at your local pharmacy   I have ordered the following labs for you:   Lab Orders         Microalbumin / creatinine urine ratio         POC Hbg A1C      I will call if any are abnormal. All of your labs can be accessed through My Chart.   My Chart Access: [email or web address]  Please follow-up in: 3 months    We look forward to seeing you next time. Please call our clinic at [phone] if you have any questions or concerns. The best time to call is Monday-Friday from 9am-4pm, but there is someone available 24/7. If after hours or the weekend, call the main hospital number and ask for the Internal Medicine Resident On-Call. If you need medication refills, please notify your pharmacy one week in advance and they will send us  a request.   Thank you for letting us  take part in your care. Wishing you the best!  Elnora Ip, MD 09/10/2023, 1:26 PM [facility]

## 2023-09-10 NOTE — Assessment & Plan Note (Addendum)
 Last A1c: Lab Results      Component                Value               Date                      HGBA1C                   5.9 (A)   on 07/14/2023           She is currently on Atorvastatin  80 mg daily  - Last eye exam:  - Foot/podiatr: no recent concerns about calluses or skin ulcers. - Current medications: Metformin  500 mg AM. Patient has has GI side effects with metformin  dosing, specially when taking it without food without significant improvement with foods.  Ozempic  2 mg weekly without side effects -Denies neuropathy symptoms.  - A1c today 5.5 -Continue Ozempic  2 mg weekly  -repeat screening urine microalbumin cr ratio -A1c in 3 months; if controlled off metformin  and on Ozempic , patient can be assessed every 6 months -Discussed lifestyle modifications

## 2023-09-10 NOTE — Assessment & Plan Note (Signed)
 BMI 37 ->30 ->31. Weight has increased in this office compared to 08/10/2023. Scale differences, clothes and time of day likely contributing. Continues on ozempic  and making dietary and daily movement practice. Denies significant food excursions during the holiday. At home, her early morning weight is ~172, maintained. Has been working out 3x weekly, over an hour of cardiovascular exercise. This stopped the last week of December and early Jan. Will restart - Discussed monitoring trend, especially as she [facility] - Discussed lifestyle modifications and her adherence with the weight management clinic - 150 minutes of moderate intensity aerobic activity a week and strengthening exercises 2-3 times per week

## 2023-09-11 ENCOUNTER — Other Ambulatory Visit (HOSPITAL_COMMUNITY): Payer: Self-pay

## 2023-09-11 NOTE — Progress Notes (Signed)
 Internal Medicine Clinic Attending  Case discussed with the resident at the time of the visit.  We reviewed the resident's history and exam and pertinent patient test results.  I agree with the assessment, diagnosis, and plan of care documented in the resident's note.

## 2023-09-13 NOTE — Progress Notes (Signed)
 Patient Care Team: Fernand Prost, MD as PCP - General (Internal Medicine) Lanny Callander, MD as Attending Physician (Hematology and Oncology) Burton, Lacie K, NP as Nurse Practitioner (Hematology and Oncology)  Clinic Day:  09/15/2023  Referring physician: Fernand Prost, MD  ASSESSMENT & PLAN:   Assessment & Plan: Anemia She has no prior history of anemia until Ortho surgery in 09/2021, anemia resolved after surgery, then recurrent more severe anemia with second surgery in 10/2021 Hgb 7.9 -She started oral iron p.o. once daily 10/21/2021, tolerating well with mild constipation managed with OTC meds -Past GI work-up 12/2020 showed duodenitis and H. Pylori+ which was treated.  No obvious bleeding source on upper endoscopy or colonoscopy (Dr. Leigh) -She does not currently have menstrual periods -Recent folic acid, B12, thyroid panel, and iron panel were normal -Her mild anemia is likely related to orthopedic operations and is recovering.   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   -She received 1 dose IV Feraheme in 07/2022 and continues oral iron, tolerated well. IDA resolved with IV and oral iron -Stopped oral iron in 10/2022 -Meghan Wilson is clinically doing well. CBC and iron/TIBC are normal, ferritin also normmal. Likely can remain off oral iron  Plan: Labs reviewed  -CBC showing WBC 6.2; Hgb 13.1; Hct 41.1; Plt 349; Anc 3.3; MCV 84.9 -ferritin and iron panel are pending Recommended she restart oral iron, perhaps prenatal vitamin with extra iron along with orange juice to help with absorption.  Will set up IV iron at [facility] as indicated.  Repeat labs and follow up in 3 months.    The patient understands the plans discussed today and is in agreement with them.  She knows to contact our office if she develops concerns prior to her next appointment.  I provided 20 minutes of face-to-face time during this encounter and >  50% was spent counseling as documented under my assessment and plan.    Meghan FORBES Lessen, NP  [facility] - A DEPT OF [facility] [address] [place] [address] Dept: [phone] Dept Fax: [phone]   No orders of the defined types were placed in this encounter.     CHIEF COMPLAINT:  CC: Normocytic anemia  Current Treatment: IV Feraheme as needed  INTERVAL HISTORY:  Meghan Wilson is here today for repeat clinical assessment.  She was last seen by Lacie, NP on 03/11/2023.  Ferritin on that day was 195.  She did not require IV iron.  Asked to stay off oral iron due to stomach upset. She reports feeling fatigued and sluggish since October. Feels like this may be related to the holidays and having company off and on since then. She states that she had the flu a few months ago. Has noted a lingering cough. Sometimes, this produces phlegm. Denies chest pain, palpitations, shortness of breath, or wheezing. She denies headache or visual disturbance. She denies neuropathy in hands or feet. She has been a non smoker for over 7 years.   She denies fevers or chills. She denies pain. Her appetite is good. Her weight has decreased 4 pounds over last week .  I have reviewed the past medical history, past surgical history, social history and family history with the patient and they are unchanged from previous note.  ALLERGIES:  is allergic to aspirin , depakote [divalproex sodium], ibuprofen, prednisone , and latex.  MEDICATIONS:  Current Outpatient  Medications  Medication Sig Dispense Refill   Accu-Chek FastClix Lancets MISC use 1 strip to check blood sugar up to 7 times a week 102 each 3   atorvastatin  (LIPITOR) 80 MG tablet Take 1 tablet (80 mg total) by mouth daily. 30 tablet 11   bacitracin -polymyxin b  (POLYSPORIN ) ophthalmic ointment Apply a small amount into the right eye 3 (three) times daily. 3.5 g 0   cycloSPORINE  (RESTASIS )  0.05 % ophthalmic emulsion Place 1 drop into both eyes 2 (two) times daily. 180 each 3   doxepin  (SINEQUAN ) 10 MG capsule Take 1 capsule (10 mg total) by mouth at bedtime as needed for sleep 30 capsule 3   erythromycin  ophthalmic ointment Apply a small amount into the right eye 3 (three) times daily. 3.5 g 0   ferrous sulfate  325 (65 FE) MG tablet Take 325 mg by mouth daily with breakfast.     fluorometholone  (FML) 0.1 % ophthalmic suspension Place 1 drop into the right eye 4 (four) times daily. 5 mL 0   fluticasone  (FLONASE ) 50 MCG/ACT nasal spray Place 1 spray into both nostrils daily as needed for allergies or rhinitis 16 g 2   Glycerin -Hypromellose-PEG 400 (DRY EYE RELIEF DROPS) 0.2-0.2-1 % SOLN Place 1 drop into both eyes in the morning and at bedtime.     lamoTRIgine  (LAMICTAL ) 100 MG tablet Take 1 tablet (100 mg total) by mouth daily. 30 tablet 3   linaclotide  (LINZESS ) 145 MCG CAPS capsule Take 1 capsule (145 mcg total) by mouth daily before breakfast. 30 capsule 11   meloxicam  (MOBIC ) 15 MG tablet Take 1 tablet (15 mg total) by mouth daily. 30 tablet 2   ondansetron  (ZOFRAN ) 4 MG tablet Take 1 tablet (4 mg total) by mouth every 6 (six) hours as needed for nausea. 20 tablet 0   polyethylene glycol (MIRALAX  / GLYCOLAX ) 17 g packet Mix 1 packet (17 g total) in 8 oz clear liquid and drink by mouth daily as needed for mild constipation. 14 each 0   prazosin  (MINIPRESS ) 1 MG capsule Take 1 capsule (1 mg total) by mouth at bedtime. 30 capsule 3   pregabalin  (LYRICA ) 75 MG capsule Take 1 capsule (75 mg total) by mouth at bedtime as needed. (Patient taking differently: Take 75 mg by mouth at bedtime.) 30 capsule 5   Semaglutide , 2 MG/DOSE, 8 MG/3ML SOPN Inject 2 mg as directed once a week. 3 mL 2   triamcinolone  cream (KENALOG ) 0.1 % Apply 1 Application topically daily. Apply underneath breast as needed 45 g 2   triamcinolone  ointment (KENALOG ) 0.5 % Apply 1 Application topically daily as needed.  Apply to hands as needed. 30 g 2   No current facility-administered medications for this visit.    HISTORY OF PRESENT ILLNESS:   Oncology History   No history exists.      REVIEW OF SYSTEMS:   Constitutional: Denies fevers, chills or abnormal weight loss Eyes: Denies blurriness of vision Ears, nose, mouth, throat, and face: Denies mucositis or sore throat Respiratory: has intermittent cough. Denies wheezes or shortness of breath.  Cardiovascular: Denies palpitation, chest discomfort or lower extremity swelling Gastrointestinal:  Denies nausea, heartburn or change in bowel habits Skin: Denies abnormal skin rashes Lymphatics: Denies new lymphadenopathy or easy bruising Neurological:Denies numbness, tingling or new weaknesses Behavioral/Psych: Mood is stable, no new changes  All other systems were reviewed with the patient and are negative.   VITALS:   Today's Vitals   09/15/23 0856 09/15/23 0903  BP:  122/81   Pulse: 96   Resp: 14   Temp: 98.1 F (36.7 C)   TempSrc: Temporal   SpO2: 99%   Weight: 176 lb 14.4 oz (80.2 kg)   PainSc:  0-No pain   Body mass index is 31.34 kg/m.   Wt Readings from Last 3 Encounters:  09/15/23 176 lb 14.4 oz (80.2 kg)  09/10/23 180 lb (81.6 kg)  08/10/23 172 lb (78 kg)    Body mass index is 31.34 kg/m.  Performance status (ECOG): 1 - Symptomatic but completely ambulatory  PHYSICAL EXAM:   GENERAL:alert, no distress and comfortable SKIN: skin color, texture, turgor are normal, no rashes or significant lesions EYES: normal, Conjunctiva are pink and non-injected, sclera clear OROPHARYNX:no exudate, no erythema and lips, buccal mucosa, and tongue normal  NECK: supple, thyroid normal size, non-tender, without nodularity LYMPH:  no palpable lymphadenopathy in the cervical, axillary or inguinal LUNGS: clear to auscultation and percussion with normal breathing effort HEART: regular rate & rhythm and no murmurs and no lower extremity  edema ABDOMEN:abdomen soft, non-tender and normal bowel sounds Musculoskeletal:no cyanosis of digits and no clubbing  NEURO: alert & oriented x 3 with fluent speech, no focal motor/sensory deficits  LABORATORY DATA:  I have reviewed the data as listed    Component Value Date/Time   NA 138 03/11/2023 0845   K 4.6 03/11/2023 0845   CL 100 03/11/2023 0845   CO2 24 03/11/2023 0845   GLUCOSE 83 03/11/2023 0845   GLUCOSE 100 (H) 07/06/2022 0944   BUN 9 03/11/2023 0845   CREATININE 0.57 03/11/2023 0845   CALCIUM  9.6 03/11/2023 0845   PROT 6.5 03/11/2023 0845   ALBUMIN  4.4 03/11/2023 0845   AST 16 03/11/2023 0845   ALT 26 03/11/2023 0845   ALKPHOS 100 03/11/2023 0845   BILITOT 0.3 03/11/2023 0845   GFRNONAA >60 07/06/2022 0944   GFRAA 119 05/11/2020 0935     Lab Results  Component Value Date   WBC 6.2 09/15/2023   NEUTROABS 3.3 09/15/2023   HGB 13.1 09/15/2023   HCT 41.1 09/15/2023   MCV 84.9 09/15/2023   PLT 349 09/15/2023    RADIOGRAPHIC STUDIES: MM 3D SCREENING MAMMOGRAM BILATERAL BREAST Result Date: 09/04/2023 CLINICAL DATA:  Screening. EXAM: DIGITAL SCREENING BILATERAL MAMMOGRAM WITH TOMOSYNTHESIS AND CAD TECHNIQUE: Bilateral screening digital craniocaudal and mediolateral oblique mammograms were obtained. Bilateral screening digital breast tomosynthesis was performed. The images were evaluated with computer-aided detection. COMPARISON:  Previous exam(s). ACR Breast Density Category a: The breasts are almost entirely fatty. FINDINGS: There are no findings suspicious for malignancy. IMPRESSION: No mammographic evidence of malignancy. A result letter of this screening mammogram will be mailed directly to the patient. RECOMMENDATION: Screening mammogram in one year. (Code:SM-B-01Y) BI-RADS CATEGORY  1: Negative. Electronically Signed   By: Alm Parkins M.D.   On: 09/04/2023 12:31

## 2023-09-13 NOTE — Assessment & Plan Note (Signed)
 She has no prior history of anemia until Ortho surgery in 09/2021, anemia resolved after surgery, then recurrent more severe anemia with second surgery in 10/2021 Hgb 7.9 -She started oral iron p.o. once daily 10/21/2021, tolerating well with mild constipation managed with OTC meds -Past GI work-up 12/2020 showed duodenitis and H. Pylori+ which was treated.  No obvious bleeding source on upper endoscopy or colonoscopy (Dr. Adela Lank) -She does not currently have menstrual periods -Recent folic acid, B12, thyroid panel, and iron panel were normal -Her mild anemia is likely related to orthopedic operations and is recovering.   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   -She received 1 dose IV Feraheme in 07/2022 and continues oral iron, tolerated well. IDA resolved with IV and oral iron -Stopped oral iron in 10/2022 -Ms. Heffley is clinically doing well. CBC and iron/TIBC are normal, ferritin also normmal. Likely can remain off oral iron

## 2023-09-15 ENCOUNTER — Inpatient Hospital Stay (HOSPITAL_BASED_OUTPATIENT_CLINIC_OR_DEPARTMENT_OTHER): Payer: MEDICAID | Admitting: Nurse Practitioner

## 2023-09-15 ENCOUNTER — Inpatient Hospital Stay: Payer: MEDICAID | Attending: Nurse Practitioner

## 2023-09-15 ENCOUNTER — Encounter: Payer: Self-pay | Admitting: Nurse Practitioner

## 2023-09-15 VITALS — BP 122/81 | HR 96 | Temp 98.1°F | Resp 14 | Wt 176.9 lb

## 2023-09-15 DIAGNOSIS — Z8619 Personal history of other infectious and parasitic diseases: Secondary | ICD-10-CM | POA: Diagnosis not present

## 2023-09-15 DIAGNOSIS — Z87891 Personal history of nicotine dependence: Secondary | ICD-10-CM | POA: Diagnosis not present

## 2023-09-15 DIAGNOSIS — D649 Anemia, unspecified: Secondary | ICD-10-CM | POA: Insufficient documentation

## 2023-09-15 DIAGNOSIS — D508 Other iron deficiency anemias: Secondary | ICD-10-CM

## 2023-09-15 LAB — FERRITIN: Ferritin: 169 ng/mL (ref 11–307)

## 2023-09-15 LAB — IRON AND IRON BINDING CAPACITY (CC-WL,HP ONLY)
Iron: 97 ug/dL (ref 28–170)
Saturation Ratios: 24 % (ref 10.4–31.8)
TIBC: 403 ug/dL (ref 250–450)
UIBC: 306 ug/dL (ref 148–442)

## 2023-09-15 LAB — CBC WITH DIFFERENTIAL (CANCER CENTER ONLY)
Abs Immature Granulocytes: 0.02 10*3/uL (ref 0.00–0.07)
Basophils Absolute: 0 10*3/uL (ref 0.0–0.1)
Basophils Relative: 1 %
Eosinophils Absolute: 0.1 10*3/uL (ref 0.0–0.5)
Eosinophils Relative: 1 %
HCT: 41.1 % (ref 36.0–46.0)
Hemoglobin: 13.1 g/dL (ref 12.0–15.0)
Immature Granulocytes: 0 %
Lymphocytes Relative: 35 %
Lymphs Abs: 2.2 10*3/uL (ref 0.7–4.0)
MCH: 27.1 pg (ref 26.0–34.0)
MCHC: 31.9 g/dL (ref 30.0–36.0)
MCV: 84.9 fL (ref 80.0–100.0)
Monocytes Absolute: 0.6 10*3/uL (ref 0.1–1.0)
Monocytes Relative: 10 %
Neutro Abs: 3.3 10*3/uL (ref 1.7–7.7)
Neutrophils Relative %: 53 %
Platelet Count: 349 10*3/uL (ref 150–400)
RBC: 4.84 MIL/uL (ref 3.87–5.11)
RDW: 14.6 % (ref 11.5–15.5)
WBC Count: 6.2 10*3/uL (ref 4.0–10.5)
nRBC: 0 % (ref 0.0–0.2)

## 2023-09-16 ENCOUNTER — Other Ambulatory Visit (HOSPITAL_COMMUNITY): Payer: Self-pay

## 2023-09-16 ENCOUNTER — Ambulatory Visit (INDEPENDENT_AMBULATORY_CARE_PROVIDER_SITE_OTHER): Payer: MEDICAID | Admitting: Internal Medicine

## 2023-09-16 ENCOUNTER — Encounter (INDEPENDENT_AMBULATORY_CARE_PROVIDER_SITE_OTHER): Payer: Self-pay | Admitting: Internal Medicine

## 2023-09-16 VITALS — BP 130/74 | HR 93 | Ht 63.0 in | Wt 172.0 lb

## 2023-09-16 DIAGNOSIS — Z7985 Long-term (current) use of injectable non-insulin antidiabetic drugs: Secondary | ICD-10-CM | POA: Diagnosis not present

## 2023-09-16 DIAGNOSIS — E669 Obesity, unspecified: Secondary | ICD-10-CM | POA: Diagnosis not present

## 2023-09-16 DIAGNOSIS — G4733 Obstructive sleep apnea (adult) (pediatric): Secondary | ICD-10-CM | POA: Diagnosis not present

## 2023-09-16 DIAGNOSIS — E119 Type 2 diabetes mellitus without complications: Secondary | ICD-10-CM

## 2023-09-16 DIAGNOSIS — E1169 Type 2 diabetes mellitus with other specified complication: Secondary | ICD-10-CM

## 2023-09-16 MED ORDER — SEMAGLUTIDE (2 MG/DOSE) 8 MG/3ML ~~LOC~~ SOPN
2.0000 mg | PEN_INJECTOR | SUBCUTANEOUS | 1 refills | Status: DC
  Filled 2023-09-16 – 2023-09-25 (×2): qty 3, 28d supply, fill #0
  Filled 2023-10-23 – 2023-11-13 (×3): qty 3, 28d supply, fill #1

## 2023-09-16 NOTE — Progress Notes (Signed)
 Office: [phone]  /  Fax: [phone]  Weight Summary And Biometrics  Vitals Temp: 98.1 F (36.7 C) BP: 130/74 Pulse Rate: 93 SpO2: 100 %   Anthropometric Measurements Height: 5\' 3"  (1.6 m) Weight: 172 lb (78 kg) BMI (Calculated): 30.48 Weight at Last Visit: 172 lb Weight Lost Since Last Visit: 0 lb Weight Gained Since Last Visit: 0 lb Starting Weight: 295 lb Total Weight Loss (lbs): 23 lb (10.4 kg) Peak Weight: 207 lb   Body Composition  Body Fat %: 42.3 % Fat Mass (lbs): 72.8 lbs Muscle Mass (lbs): 94.2 lbs Total Body Water  (lbs): 64.6 lbs Visceral Fat Rating : 11    No data recorded Today's Visit #: 27  Starting Date: 11/02/22   Subjective   Chief Complaint: Obesity  Meghan Wilson is here to discuss her progress with her obesity treatment plan. She is on the the Category 2 Plan and states she is following her eating plan approximately 70 % of the time. She states she is exercising, started last week, 60 minutes 2 times per week.  Interval History:   Since last office visit she has maintained weight. She reports good adherence to reduced calorie nutritional plan. She has been working on reading food labels, not skipping meals, increasing protein intake at every meal, drinking more water , making healthier choices, reducing portion sizes, and incorporating more whole foods     Orexigenic Control:  Denies problems with appetite and hunger signals.  Denies problems with satiety and satiation.  Denies problems with eating patterns and portion control.  Denies abnormal cravings. Denies feeling deprived or restricted.   Barriers identified: medical comorbidities and recent lapse during the holidays .   Pharmacotherapy for weight loss: She is currently taking Ozempic  with diabetes as the primary indication with adequate clinical response  and without side effects..   Assessment and Plan   Treatment Plan For Obesity:  Recommended Dietary Goals  Meghan Wilson  is currently in the action stage of change. As such, her goal is to continue weight management plan. She has agreed to: continue current plan  Behavioral Intervention  We discussed the following Behavioral Modification Strategies today: continue to work on maintaining a reduced calorie state, getting the recommended amount of protein, incorporating whole foods, making healthy choices, staying well hydrated and practicing mindfulness when eating..  Additional resources provided today: None  Recommended Physical Activity Goals  Meghan Wilson has been advised to work up to 150 minutes of moderate intensity aerobic activity a week and strengthening exercises 2-3 times per week for cardiovascular health, weight loss maintenance and preservation of muscle mass.   She has agreed to :  Think about enjoyable ways to increase daily physical activity and overcoming barriers to exercise and Increase physical activity in their day and reduce sedentary time (increase NEAT).  Pharmacotherapy  We discussed various medication options to help Meghan Wilson with her weight loss efforts and we both agreed to : continue current anti-obesity medication regimen  Associated Conditions Addressed and Impacted by Obesity Treatment   Assessment and Plan    Obesity Patient has maintained weight through the holidays with improvements in A1c, body fat percentage, and visceral fat rating. BMI decreased from 34 to 30. Currently on Ozempic , effective for diabetes management and appetite suppression. Compliant with dietary recommendations and resumed exercising. Discussed metformin  addition but decided against due to low A1c and hypoglycemia risk. Advised against cosmetic fat removal procedures due to temporary results and fat regain tendency. Recommended contouring surgery post-goal weight for excess  skin removal. - Continue Ozempic  at current dose - Monitor for hypoglycemia signs (sweating, shaking, nightmares) - Encourage dietary  compliance and physical activity - Follow up in 4 weeks  Type 2 Diabetes Mellitus A1c at 5.5, indicating good control. On Ozempic , effective for diabetes management and appetite suppression. Discussed Ozempic 's role in insulin  regulation and hypoglycemia monitoring, especially with symptoms like sweating, shaking, or nightmares. Explained Ozempic 's limited effect on blood sugars unless combined with factors like meal skipping. - Continue Ozempic  at current dose - Monitor for hypoglycemia signs - Check blood sugar if hypoglycemia symptoms occur - Ensure regular meals to avoid low blood sugar  General Health Maintenance Compliant with high protein, low carbohydrate diet and resumed exercising, showing positive weight and body composition results. Discussed importance of hydration and physical activity. Advised against unnecessary cosmetic procedures and recommended plastic surgeon consultation for contouring surgery post-goal weight. - Encourage dietary compliance and physical activity - Ensure adequate hydration - Consider plastic surgeon consultation for contouring surgery post-goal weight  Follow-up - Follow up in 4 weeks - Refer to primary care group in [place] for ongoing management.        Objective   Physical Exam:  Blood pressure 130/74, pulse 93, height 5\' 3"  (1.6 m), weight 172 lb (78 kg), SpO2 100%. Body mass index is 30.47 kg/m.  General: She is overweight, cooperative, alert, well developed, and in no acute distress. PSYCH: Has normal mood, affect and thought process.   HEENT: EOMI, sclerae are anicteric. Lungs: Normal breathing effort, no conversational dyspnea. Extremities: No edema.  Neurologic: No gross sensory or motor deficits. No tremors or fasciculations noted.    Diagnostic Data Reviewed:  BMET    Component Value Date/Time   NA 138 03/11/2023 0845   K 4.6 03/11/2023 0845   CL 100 03/11/2023 0845   CO2 24 03/11/2023 0845   GLUCOSE 83 03/11/2023 0845    GLUCOSE 100 (H) 07/06/2022 0944   BUN 9 03/11/2023 0845   CREATININE 0.57 03/11/2023 0845   CALCIUM  9.6 03/11/2023 0845   GFRNONAA >60 07/06/2022 0944   GFRAA 119 05/11/2020 0935   Lab Results  Component Value Date   HGBA1C 5.5 09/10/2023   HGBA1C 6.5 (H) 04/02/2018   Lab Results  Component Value Date   INSULIN  6.1 03/11/2023   INSULIN  5.8 11/21/2021   Lab Results  Component Value Date   TSH 0.976 11/21/2021   CBC    Component Value Date/Time   WBC 6.2 09/15/2023 0832   WBC 7.3 07/06/2022 0944   RBC 4.84 09/15/2023 0832   HGB 13.1 09/15/2023 0832   HGB 11.9 06/25/2022 1420   HCT 41.1 09/15/2023 0832   HCT 38.5 06/25/2022 1420   PLT 349 09/15/2023 0832   PLT 398 06/25/2022 1420   MCV 84.9 09/15/2023 0832   MCV 81 06/25/2022 1420   MCH 27.1 09/15/2023 0832   MCHC 31.9 09/15/2023 0832   RDW 14.6 09/15/2023 0832   RDW 13.5 06/25/2022 1420   Iron Studies    Component Value Date/Time   IRON 97 09/15/2023 0832   IRON 24 (L) 06/25/2022 1420   TIBC 403 09/15/2023 0832   TIBC 351 06/25/2022 1420   FERRITIN 169 09/15/2023 0833   FERRITIN 81 06/25/2022 1420   IRONPCTSAT 24 09/15/2023 0832   IRONPCTSAT 7 (LL) 06/25/2022 1420   Lipid Panel     Component Value Date/Time   CHOL 144 09/04/2022 1027   TRIG 52 09/04/2022 1027   HDL 70 09/04/2022 1027  CHOLHDL 2.7 05/02/2021 1158   CHOLHDL 2.9 04/02/2018 1115   VLDL 51 (H) 04/02/2018 1115   LDLCALC 63 09/04/2022 1027   Hepatic Function Panel     Component Value Date/Time   PROT 6.5 03/11/2023 0845   ALBUMIN  4.4 03/11/2023 0845   AST 16 03/11/2023 0845   ALT 26 03/11/2023 0845   ALKPHOS 100 03/11/2023 0845   BILITOT 0.3 03/11/2023 0845      Component Value Date/Time   TSH 0.976 11/21/2021 1007   Nutritional Lab Results  Component Value Date   VD25OH 78.3 03/11/2023   VD25OH 54.3 09/04/2022   VD25OH 34.2 11/21/2021    Follow-Up   No follow-ups on file.Aaron Aas She was informed of the importance of  frequent follow up visits to maximize her success with intensive lifestyle modifications for her multiple health conditions.  Attestation Statement   Reviewed by clinician on day of visit: allergies, medications, problem list, medical history, surgical history, family history, social history, and previous encounter notes.     Ladd Picker, MD

## 2023-09-25 ENCOUNTER — Other Ambulatory Visit (HOSPITAL_COMMUNITY): Payer: Self-pay

## 2023-10-01 ENCOUNTER — Other Ambulatory Visit (HOSPITAL_COMMUNITY): Payer: Self-pay

## 2023-10-08 ENCOUNTER — Emergency Department (HOSPITAL_COMMUNITY)
Admission: EM | Admit: 2023-10-08 | Discharge: 2023-10-08 | Discharge status: Against Medical Advice | Payer: MEDICAID | Source: Home / Self Care

## 2023-10-09 ENCOUNTER — Encounter (HOSPITAL_COMMUNITY): Payer: Self-pay

## 2023-10-09 ENCOUNTER — Other Ambulatory Visit (HOSPITAL_COMMUNITY): Payer: Self-pay

## 2023-10-09 ENCOUNTER — Emergency Department (HOSPITAL_COMMUNITY): Payer: MEDICAID

## 2023-10-09 ENCOUNTER — Other Ambulatory Visit: Payer: Self-pay

## 2023-10-09 ENCOUNTER — Emergency Department (HOSPITAL_COMMUNITY)
Admission: EM | Admit: 2023-10-09 | Discharge: 2023-10-09 | Disposition: A | Discharge status: Home / Self Care | Payer: MEDICAID | Attending: Emergency Medicine | Admitting: Emergency Medicine

## 2023-10-09 DIAGNOSIS — Z20822 Contact with and (suspected) exposure to covid-19: Secondary | ICD-10-CM | POA: Insufficient documentation

## 2023-10-09 DIAGNOSIS — J111 Influenza due to unidentified influenza virus with other respiratory manifestations: Secondary | ICD-10-CM | POA: Insufficient documentation

## 2023-10-09 DIAGNOSIS — Z91040 Latex allergy status: Secondary | ICD-10-CM | POA: Diagnosis not present

## 2023-10-09 DIAGNOSIS — R079 Chest pain, unspecified: Secondary | ICD-10-CM | POA: Diagnosis present

## 2023-10-09 DIAGNOSIS — E119 Type 2 diabetes mellitus without complications: Secondary | ICD-10-CM | POA: Insufficient documentation

## 2023-10-09 LAB — CBC
HCT: 38.8 % (ref 36.0–46.0)
Hemoglobin: 12.3 g/dL (ref 12.0–15.0)
MCH: 27.3 pg (ref 26.0–34.0)
MCHC: 31.7 g/dL (ref 30.0–36.0)
MCV: 86 fL (ref 80.0–100.0)
Platelets: 246 10*3/uL (ref 150–400)
RBC: 4.51 MIL/uL (ref 3.87–5.11)
RDW: 15 % (ref 11.5–15.5)
WBC: 5.8 10*3/uL (ref 4.0–10.5)
nRBC: 0 % (ref 0.0–0.2)

## 2023-10-09 LAB — BASIC METABOLIC PANEL
Anion gap: 10 (ref 5–15)
BUN: <5 mg/dL — ABNORMAL LOW (ref 8–23)
CO2: 23 mmol/L (ref 22–32)
Calcium: 8.9 mg/dL (ref 8.9–10.3)
Chloride: 104 mmol/L (ref 98–111)
Creatinine, Ser: 0.55 mg/dL (ref 0.44–1.00)
GFR, Estimated: >60 mL/min (ref >60)
Glucose, Bld: 122 mg/dL — ABNORMAL HIGH (ref 70–99)
Potassium: 3.3 mmol/L — ABNORMAL LOW (ref 3.5–5.1)
Sodium: 137 mmol/L (ref 135–145)

## 2023-10-09 LAB — RESP PANEL BY RT-PCR (RSV, FLU A&B, COVID)  RVPGX2
Influenza A by PCR: POSITIVE — AB
Influenza B by PCR: NEGATIVE
Resp Syncytial Virus by PCR: NEGATIVE
SARS Coronavirus 2 by RT PCR: NEGATIVE

## 2023-10-09 LAB — TROPONIN I (HIGH SENSITIVITY)
Troponin I (High Sensitivity): <2 ng/L (ref <18)
Troponin I (High Sensitivity): 2 ng/L (ref <18)

## 2023-10-09 MED ORDER — OSELTAMIVIR PHOSPHATE 75 MG PO CAPS
75.0000 mg | ORAL_CAPSULE | Freq: Two times a day (BID) | ORAL | 0 refills | Status: DC
  Filled 2023-10-09: qty 10, 5d supply, fill #0

## 2023-10-09 MED ORDER — ACETAMINOPHEN 500 MG PO TABS
1000.0000 mg | ORAL_TABLET | Freq: Once | ORAL | Status: AC
  Administered 2023-10-09: 1000 mg via ORAL
  Filled 2023-10-09: qty 2

## 2023-10-09 NOTE — Discharge Instructions (Signed)
 You tested positive for influenza.  Your lab work and x-ray were otherwise reassuring.  If you develop worsening pain, worsening difficulty breathing, or any other new concerning symptoms you should return to the ED.  You should follow-up with your primary care doctor.

## 2023-10-09 NOTE — ED Triage Notes (Signed)
 Pt from home with c/o CP that started about 2wks ago and then came back on Tuesday and increasingly got worse. Pt endorses h/a, n/v/d, congestion, runny nose, cough,fever, chills, and sob. Pt states that she took some Mucinex , Alka-Setzer plus, and Nyquil with no relief.

## 2023-10-09 NOTE — ED Provider Notes (Signed)
 [place] EMERGENCY DEPARTMENT AT [facility] Provider Note   CSN: [id number] Arrival date & time: 10/09/23  9587     History  Chief Complaint  Patient presents with   Chest Pain    Meghan Wilson is a 63 y.o. female.   Chest Pain 63 year old female history of type 2 diabetes presenting for chest pain, cough, sore throat.  She states last week she started with having some cough, shortness of breath, chest feeling funny.  Since Tuesday she has had sore throat, some pain with swallowing, dry nonproductive cough.  Mild frontal headache.  Chest pain is mostly with cough.  Not short of breath.  No radiation of pain or exertional pain.  She has had some nonbloody diarrhea.  No vomiting.  Eating and drinking well.  Subjective fevers at home.  Husband was sick with similar symptoms before.  No history of DVT or PE or recent travel or leg swelling.     Home Medications Prior to Admission medications   Medication Sig Start Date End Date Taking? Authorizing Provider  oseltamivir  (TAMIFLU ) 75 MG capsule Take 1 capsule (75 mg total) by mouth every 12 (twelve) hours. 10/09/23  Yes Nicholaus Cassondra DEL, MD  Accu-Chek FastClix Lancets MISC use 1 strip to check blood sugar up to 7 times a week 02/01/22   Fernand Prost, MD  atorvastatin  (LIPITOR) 80 MG tablet Take 1 tablet (80 mg total) by mouth daily. 10/23/22   Fernand Prost, MD  bacitracin -polymyxin b  (POLYSPORIN ) ophthalmic ointment Apply a small amount into the right eye 3 (three) times daily. 10/09/22     cycloSPORINE  (RESTASIS ) 0.05 % ophthalmic emulsion Place 1 drop into both eyes 2 (two) times daily. 07/01/23     doxepin  (SINEQUAN ) 10 MG capsule Take 1 capsule (10 mg total) by mouth at bedtime as needed for sleep 09/10/23     erythromycin  ophthalmic ointment Apply a small amount into the right eye 3 (three) times daily. 10/09/22     ferrous sulfate  325 (65 FE) MG tablet Take 325 mg by mouth daily with breakfast.    [provider]   fluorometholone  (FML) 0.1 % ophthalmic suspension Place 1 drop into the right eye 4 (four) times daily. 10/21/22     fluticasone  (FLONASE ) 50 MCG/ACT nasal spray Place 1 spray into both nostrils daily as needed for allergies or rhinitis 09/07/23 09/06/24  Fernand Prost, MD  Glycerin -Hypromellose-PEG 400 (DRY EYE RELIEF DROPS) 0.2-0.2-1 % SOLN Place 1 drop into both eyes in the morning and at bedtime.    [provider]  lamoTRIgine  (LAMICTAL ) 100 MG tablet Take 1 tablet (100 mg total) by mouth daily. 09/10/23     linaclotide  (LINZESS ) 145 MCG CAPS capsule Take 1 capsule (145 mcg total) by mouth daily before breakfast. 09/29/22   Armbruster, Elspeth SQUIBB, MD  meloxicam  (MOBIC ) 15 MG tablet Take 1 tablet (15 mg total) by mouth daily. 05/28/22     ondansetron  (ZOFRAN ) 4 MG tablet Take 1 tablet (4 mg total) by mouth every 6 (six) hours as needed for nausea. 05/14/22   Patti Rosina SAUNDERS, PA-C  polyethylene glycol (MIRALAX  / GLYCOLAX ) 17 g packet Mix 1 packet (17 g total) in 8 oz clear liquid and drink by mouth daily as needed for mild constipation. 10/18/21   Patti Rosina SAUNDERS, PA-C  prazosin  (MINIPRESS ) 1 MG capsule Take 1 capsule (1 mg total) by mouth at bedtime. 09/10/23     pregabalin  (LYRICA ) 75 MG capsule Take 1 capsule (75 mg total)  by mouth at bedtime as needed. Patient taking differently: Take 75 mg by mouth at bedtime. 04/07/22   Lou Claretta HERO, MD  Semaglutide , 2 MG/DOSE, 8 MG/3ML SOPN Inject 2 mg as directed once a week. 09/16/23   Francyne Romano, MD  triamcinolone  cream (KENALOG ) 0.1 % Apply 1 Application topically daily. Apply underneath breast as needed 01/13/23   Zheng, Michael, DO  triamcinolone  ointment (KENALOG ) 0.5 % Apply 1 Application topically daily as needed. Apply to hands as needed. 01/13/23   Zheng, Michael, DO      Allergies    Aspirin , Depakote [divalproex sodium], Ibuprofen, Prednisone , and Latex    Review of Systems   Review of Systems  Cardiovascular:  Positive for chest  pain.  Review of systems completed and notable as per HPI.  ROS otherwise negative.   Physical Exam Updated Vital Signs BP 125/82 (BP Location: Right Arm)   Pulse 91   Temp 99.5 F (37.5 C) (Oral)   Resp (!) 22   Ht 5' 3 (1.6 m)   Wt 78 kg   SpO2 98%   BMI 30.47 kg/m  Physical Exam Vitals and nursing note reviewed.  Constitutional:      General: She is not in acute distress.    Appearance: She is well-developed.  HENT:     Head: Normocephalic and atraumatic.     Nose: Nose normal.     Mouth/Throat:     Mouth: Mucous membranes are moist.     Pharynx: Oropharynx is clear. No oropharyngeal exudate or posterior oropharyngeal erythema.  Eyes:     Extraocular Movements: Extraocular movements intact.     Conjunctiva/sclera: Conjunctivae normal.     Pupils: Pupils are equal, round, and reactive to light.  Cardiovascular:     Rate and Rhythm: Normal rate and regular rhythm.     Pulses: Normal pulses.     Heart sounds: Normal heart sounds. No murmur heard. Pulmonary:     Effort: Pulmonary effort is normal. No respiratory distress.     Breath sounds: Normal breath sounds.  Abdominal:     Palpations: Abdomen is soft.     Tenderness: There is no abdominal tenderness.  Musculoskeletal:        General: No swelling.     Cervical back: Normal range of motion and neck supple. No rigidity or tenderness.     Right lower leg: No edema.     Left lower leg: No edema.  Lymphadenopathy:     Cervical: No cervical adenopathy.  Skin:    General: Skin is warm and dry.     Capillary Refill: Capillary refill takes less than 2 seconds.  Neurological:     General: No focal deficit present.     Mental Status: She is alert and oriented to person, place, and time. Mental status is at baseline.  Psychiatric:        Mood and Affect: Mood normal.     ED Results / Procedures / Treatments   Labs (all labs ordered are listed, but only abnormal results are displayed) Labs Reviewed  RESP PANEL BY  RT-PCR (RSV, FLU A&B, COVID)  RVPGX2 - Abnormal; Notable for the following components:      Result Value   Influenza A by PCR POSITIVE (*)    All other components within normal limits  BASIC METABOLIC PANEL - Abnormal; Notable for the following components:   Potassium 3.3 (*)    Glucose, Bld 122 (*)    BUN <5 (*)  All other components within normal limits  CBC  TROPONIN I (HIGH SENSITIVITY)  TROPONIN I (HIGH SENSITIVITY)    EKG EKG Interpretation Date/Time:  Friday October 09 2023 04:33:39 EST Ventricular Rate:  93 PR Interval:  140 QRS Duration:  93 QT Interval:  358 QTC Calculation: 446 R Axis:   46  Text Interpretation: Sinus rhythm Low voltage, precordial leads Abnormal R-wave progression, early transition When compared with ECG of 05/02/2021, No significant change was found Confirmed by Raford Lenis ([id number]) on 10/09/2023 7:03:11 AM  Radiology DG Chest 2 View Result Date: 10/09/2023 CLINICAL DATA:  Coughing and chest pain, sore throat and fever. EXAM: CHEST - 2 VIEW COMPARISON:  PA Lat 07/06/2022 FINDINGS: There is mild cardiomegaly without evidence of CHF. Stable mediastinum with aortic ectasia, tortuosity and atherosclerosis. The lungs are clear. No pleural effusion is seen. There is thoracic kyphosis and spondylosis but no acute osseous findings. Old cholecystectomy. IMPRESSION: No evidence of acute chest disease. Mild cardiomegaly. Aortic atherosclerosis. Electronically Signed   By: Francis Quam M.D.   On: 10/09/2023 05:58    Procedures Procedures    Medications Ordered in ED Medications  acetaminophen  (TYLENOL ) tablet 1,000 mg (has no administration in time range)    ED Course/ Medical Decision Making/ A&P Clinical Course as of 10/09/23 9092  Fri Oct 09, 2023  0719 EKG non-ischemic [JD]    Clinical Course User Index [JD] Nicholaus Cassondra DEL, MD                                 Medical Decision Making Amount and/or Complexity of Data Reviewed Labs:  ordered. Radiology: ordered.  Risk OTC drugs.   Medical Decision Making:   Analucia Gerhart is a 63 y.o. female who presented to the ED today with chest pain, URI symptoms.  Vital signs reviewed.  EKG is nonischemic, initial troponin is normal hide low suspicion for ACS.  I suspect has underlying viral syndrome.  COVID and flu testing are added.  Lab notable for mild hypokalemia.  Benign abdominal exam.  She has a mild sore throat but no signs of PTA, RPA, strep pharyngitis, Ludwig's angina.   Patient placed on continuous vitals and telemetry monitoring while in ED which was reviewed periodically.  Reviewed and confirmed nursing documentation for past medical history, family history, social history.  Reassessment and Plan:   Patient is flu positive which is consistent with her symptoms.  X-ray without pneumonia.  Lab workup overall reassuring.  Troponin is negative x 2, low concern for ACS.  Recommend PCP follow-up.  Return precautions given.  Patient is comfortable with this plan.   Patient's presentation is most consistent with acute complicated illness / injury requiring diagnostic workup.           Final Clinical Impression(s) / ED Diagnoses Final diagnoses:  Influenza    Rx / DC Orders ED Discharge Orders          Ordered    oseltamivir  (TAMIFLU ) 75 MG capsule  Every 12 hours        10/09/23 0907              Nicholaus Cassondra DEL, MD 10/09/23 [id number]

## 2023-10-14 ENCOUNTER — Other Ambulatory Visit: Payer: Self-pay

## 2023-10-14 ENCOUNTER — Ambulatory Visit: Payer: MEDICAID | Admitting: Student

## 2023-10-14 ENCOUNTER — Encounter: Payer: Self-pay | Admitting: Student

## 2023-10-14 VITALS — BP 117/68 | HR 89 | Temp 97.9°F | Ht 63.0 in | Wt 176.9 lb

## 2023-10-14 DIAGNOSIS — Z7985 Long-term (current) use of injectable non-insulin antidiabetic drugs: Secondary | ICD-10-CM

## 2023-10-14 DIAGNOSIS — E1169 Type 2 diabetes mellitus with other specified complication: Secondary | ICD-10-CM

## 2023-10-14 DIAGNOSIS — E669 Obesity, unspecified: Secondary | ICD-10-CM | POA: Diagnosis not present

## 2023-10-14 DIAGNOSIS — J101 Influenza due to other identified influenza virus with other respiratory manifestations: Secondary | ICD-10-CM | POA: Diagnosis not present

## 2023-10-14 DIAGNOSIS — Z6831 Body mass index (BMI) 31.0-31.9, adult: Secondary | ICD-10-CM | POA: Diagnosis not present

## 2023-10-14 NOTE — Patient Instructions (Signed)
It was great meeting you today!  Please continue focusing on the rest and hydration in regards to symptom control for flu and do not worry about taking Tamiflu as this medication would no longer be effective.  Keep up the great work in regards to your diabetes control.  Follow-up in 3 months.

## 2023-10-14 NOTE — Assessment & Plan Note (Addendum)
Seen in ED with positive Flu A 2/7.  CXR in ED unremarkable. Prescribed Tamiflu but had not started taking it.  Still endorses mild lingering cough but overall symptoms have improved. She is afebrile. Explained to patient that Tamiflu is no longer effective given that she is outside the window so will focus on rest/hydration.

## 2023-10-14 NOTE — Assessment & Plan Note (Addendum)
A1c 09/2023 was 5.5. Currently managed with Ozempic 2 mg/weekly. Had been taking Metformin 500 mg/daily but this was causing GI upset so was stopped at 09/2023 office visit.  Given that she is currently so well-controlled, we will continue monotherapy with Ozempic. If A1c continues to be within goal at 75-month follow-up, can space out follow-ups to 6 months. -Microalbumin/creatinine ratio today

## 2023-10-14 NOTE — Progress Notes (Signed)
CC: Follow-up  HPI:  [name] is a 63 y.o. female living with a history stated below and presents today for follow-up. Please see problem based assessment and plan for additional details.  Past Medical History:  Diagnosis Date   Anemia    Anxiety    Arthritis    Back pain    Bipolar depression (HCC) 09/2015   Chronic knee pain after total replacement of right knee joint 10/02/2021   Constipation    Depression 06/27/2021   Dizziness    Generalized abdominal discomfort 05/06/2021   History of colon polyps    History of kidney stones    History of patellar fracture 2016   History of tibial fracture 2016   Hyperlipidemia    IBS (irritable bowel syndrome)    Joint pain    Night terrors    Night terrors, adult 09/2015   OSA (obstructive sleep apnea) 04/07/2022   Osteoarthritis    Pain in joint of right shoulder 09/16/2021   Pain of right knee after injury 03/10/2018   Post concussion syndrome 12/20/2019   Pre-diabetes    S/P hardware removal, right tibia 09/17/2021   S/P total knee arthroplasty, 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 30 tablet 11   bacitracin-polymyxin b (POLYSPORIN) ophthalmic ointment Apply a small amount into the right eye 3 (three) times daily. 3.5 g 0   cycloSPORINE (RESTASIS) 0.05 % ophthalmic emulsion Place 1 drop into both eyes 2 (two) times daily. 180 each 3   doxepin (SINEQUAN) 10 MG capsule Take 1 capsule (10 mg total) by mouth at bedtime as needed for sleep 30 capsule 3   erythromycin ophthalmic ointment  Apply a small amount into the right eye 3 (three) times daily. 3.5 g 0   ferrous sulfate 325 (65 FE) MG tablet Take 325 mg by mouth daily with breakfast.     fluorometholone (FML) 0.1 % ophthalmic suspension Place 1 drop into the right eye 4 (four) times daily. 5 mL 0   fluticasone (FLONASE) 50 MCG/ACT nasal spray Place 1 spray into both nostrils daily as needed for allergies or rhinitis 16 g 2   Glycerin-Hypromellose-PEG 400 (DRY EYE RELIEF DROPS) 0.2-0.2-1 % SOLN Place 1 drop into both eyes in the morning and at bedtime.     lamoTRIgine (LAMICTAL) 100 MG tablet Take 1 tablet (100 mg total) by mouth daily. 30 tablet 3   linaclotide (LINZESS) 145 MCG CAPS capsule Take 1 capsule (145 mcg total) by mouth daily before breakfast. 30 capsule 11   ondansetron (ZOFRAN) 4 MG tablet Take 1 tablet (4 mg total) by mouth every 6 (six) hours as needed for nausea. 20 tablet 0   polyethylene glycol (MIRALAX / GLYCOLAX) 17 g packet Mix 1 packet (17 g total) in 8 oz clear liquid and drink by mouth daily as needed for mild constipation. 14 each 0   prazosin (MINIPRESS) 1 MG capsule Take 1 capsule (1 mg total) by mouth at bedtime. 30 capsule  3   Semaglutide, 2 MG/DOSE, 8 MG/3ML SOPN Inject 2 mg as directed once a week. 3 mL 1   triamcinolone cream (KENALOG) 0.1 % Apply 1 Application topically daily. Apply underneath breast as needed 45 g 2   triamcinolone ointment (KENALOG) 0.5 % Apply 1 Application topically daily as needed. Apply to hands as needed. 30 g 2   No current facility-administered medications on file prior to visit.    Family History  Problem Relation Age of Onset   Hyperlipidemia Mother    Hypertension Mother    Colon polyps Mother    Heart disease Mother    Diabetes Mother    Depression Mother    Anxiety disorder Mother    Bipolar disorder Mother    Eating disorder Mother    Obesity Mother    Arthritis/Rheumatoid Father    Hyperlipidemia Maternal Aunt    Hypertension Maternal Aunt     Cancer Maternal Aunt        "blood cancer"   Colon polyps Maternal Grandmother    Stroke Other    Colon cancer Neg Hx    Esophageal cancer Neg Hx    Stomach cancer Neg Hx    Rectal cancer Neg Hx    Sleep apnea Neg Hx    Breast cancer Neg Hx     Social History   Socioeconomic History   Marital status: Single    Spouse name: Not on file   Number of children: 2   Years of education: 2 years of college   Highest education level: Not on file  Occupational History   Not on file  Tobacco Use   Smoking status: Former    Current packs/day: 0.00    Average packs/day: 0.3 packs/day for 7.0 years (1.8 ttl pk-yrs)    Types: Cigarettes    Start date: 12/22/2010    Quit date: 12/21/2017    Years since quitting: 5.8   Smokeless tobacco: Never  Vaping Use   Vaping status: Never Used  Substance and Sexual Activity   Alcohol use: Yes    Comment: special occasions   Drug use: No   Sexual activity: Yes    Birth control/protection: Surgical    Comment: Celibate  Other Topics Concern   Not on file  Social History Narrative   Lives at home with her fiance.   Right-handed.   No daily use of caffeine.   Social Drivers of Corporate investment banker Strain: Not on file  Food Insecurity: No Food Insecurity (09/25/2022)   Hunger Vital Sign    Worried About Running Out of Food in the Last Year: Never true    Ran Out of Food in the Last Year: Never true  Transportation Needs: No Transportation Needs (05/14/2022)   PRAPARE - Administrator, Civil Service (Medical): No    Lack of Transportation (Non-Medical): No  Physical Activity: Not on file  Stress: Not on file  Social Connections: Moderately Isolated (09/25/2022)   Social Connection and Isolation Panel [NHANES]    Frequency of Communication with Friends and Family: More than three times a week    Frequency of Social Gatherings with Friends and Family: More than three times a week    Attends Religious Services: More than 4  times per year    Active Member of [employer] or Organizations: Not on file    Attends Banker Meetings: Never    Marital Status: Divorced  Intimate Partner Violence: Not At Risk (09/25/2022)  Humiliation, Afraid, Rape, and Kick questionnaire    Fear of Current or Ex-Partner: No    Emotionally Abused: No    Physically Abused: No    Sexually Abused: No    Review of Systems: ROS negative except for what is noted on the assessment and plan.  Vitals:   10/14/23 0843  BP: 117/68  Pulse: 89  Temp: 97.9 F (36.6 C)  TempSrc: Oral  SpO2: 99%  Weight: 176 lb 14.4 oz (80.2 kg)  Height: 5\' 3"  (1.6 m)    Physical Exam: Constitutional: well-appearing, sitting in chair, in no acute distress Cardiovascular: regular rate and rhythm, no m/r/g Pulmonary/Chest: normal work of breathing on room air, lungs clear to auscultation bilaterally   Assessment & Plan:     Patient discussed with Dr. Cleda Daub  Type 2 diabetes mellitus with obesity (HCC) A1c 09/2023 was 5.5. Currently managed with Ozempic 2 mg/weekly. Had been taking Metformin 500 mg/daily but this was causing GI upset so was stopped at 09/2023 office visit.  Given that she is currently so well-controlled, we will continue monotherapy with Ozempic. If A1c continues to be within goal at 79-month follow-up, can space out follow-ups to 6 months. -Microalbumin/creatinine ratio today  Influenza A Seen in ED with positive Flu A 2/7.  CXR in ED unremarkable. Prescribed Tamiflu but had not started taking it.  Still endorses mild lingering cough but overall symptoms have improved. She is afebrile. Explained to patient that Tamiflu is no longer effective given that she is outside the window so will focus on rest/hydration.   Carmina Miller, D.O. [facility] Internal Medicine, PGY-1 Phone: [phone] Date 10/14/2023 Time 9:21 AM

## 2023-10-15 LAB — MICROALBUMIN / CREATININE URINE RATIO
Creatinine, Urine: 33.1 mg/dL
Microalb/Creat Ratio: <9 mg/g{creat} (ref 0–29)
Microalbumin, Urine: <3 ug/mL

## 2023-10-16 ENCOUNTER — Encounter: Payer: Self-pay | Admitting: Student

## 2023-10-19 ENCOUNTER — Encounter (INDEPENDENT_AMBULATORY_CARE_PROVIDER_SITE_OTHER): Payer: Self-pay | Admitting: Internal Medicine

## 2023-10-19 ENCOUNTER — Ambulatory Visit (INDEPENDENT_AMBULATORY_CARE_PROVIDER_SITE_OTHER): Payer: MEDICAID | Admitting: Internal Medicine

## 2023-10-19 VITALS — BP 139/83 | HR 98 | Temp 97.8°F | Ht 62.0 in | Wt 171.0 lb

## 2023-10-19 DIAGNOSIS — G4733 Obstructive sleep apnea (adult) (pediatric): Secondary | ICD-10-CM

## 2023-10-19 DIAGNOSIS — Z6835 Body mass index (BMI) 35.0-35.9, adult: Secondary | ICD-10-CM

## 2023-10-19 DIAGNOSIS — Z7985 Long-term (current) use of injectable non-insulin antidiabetic drugs: Secondary | ICD-10-CM

## 2023-10-19 DIAGNOSIS — E1169 Type 2 diabetes mellitus with other specified complication: Secondary | ICD-10-CM

## 2023-10-19 DIAGNOSIS — E66812 Obesity, class 2: Secondary | ICD-10-CM

## 2023-10-19 DIAGNOSIS — E669 Obesity, unspecified: Secondary | ICD-10-CM

## 2023-10-19 MED ORDER — MULTI-VITAMIN/MINERALS PO TABS: 1.0000 | ORAL_TABLET | Freq: Every day | ORAL | Status: AC

## 2023-10-19 NOTE — Assessment & Plan Note (Addendum)
Meghan Wilson has lost a total of 32 pounds which is close to 16% of total body weight.   - Refer to obesity management plan - Continue with current weight management strategy. - Discussed adjustments to overcome identified barriers. - Reinforce dietary and exercise goals. Marland Kitchen

## 2023-10-19 NOTE — Assessment & Plan Note (Addendum)
Not tolerated CPAP due to influenza. Continue PAP therapy and current weight management strategy

## 2023-10-19 NOTE — Assessment & Plan Note (Addendum)
Her A1c is 5.5 and improved from 6.5.  She is currently on semaglutide 2 mg a week . No longer on Metformin, stopped by PCP.   She has reduced simple carbs and has been eating more salads.  She has lost 32 pounds close to 16% of her body weight.  She will continue semaglutide 2 mg.

## 2023-10-19 NOTE — Progress Notes (Signed)
Office: [phone]  /  Fax: [phone]  Weight Summary And Biometrics  Vitals Temp: 97.8 F (36.6 C) BP: 139/83 Pulse Rate: 98 SpO2: 97 %   Anthropometric Measurements Height: 5\' 2"  (1.575 m) Weight: 171 lb (77.6 kg) BMI (Calculated): 31.27 Weight at Last Visit: 172 lb Weight Lost Since Last Visit: 1 lb Weight Gained Since Last Visit: 0 Starting Weight: 195 lb Total Weight Loss (lbs): 24 lb (10.9 kg) Peak Weight: 207 lb   Body Composition  Body Fat %: 44.3 % Fat Mass (lbs): 76 lbs Muscle Mass (lbs): 90.6 lbs Total Body Water (lbs): 66.2 lbs Visceral Fat Rating : 12    No data recorded Today's Visit #: 30  Starting Date: 11/21/21   Subjective   Chief Complaint: Obesity  Meghan Wilson is here to discuss her progress with her obesity treatment plan. She is on the the Category 2 Plan and states she is following her eating plan approximately 60 % of the time. She states she is currently not exercising.  Interval History:   Since last office visit she has lost 1 pounds. She reports had recent lapse due to illness She has been working on not skipping meals, drinking more water, making healthier choices, reducing portion sizes, getting back on track following recent lapse, and incorporating more whole foods   Barriers identified: low volume of physical activity at present  and recent influenza .   Pharmacotherapy for weight loss: She is currently taking Ozempic with diabetes as the primary indication with adequate clinical response  and without side effects..   Assessment and Plan   Treatment Plan For Obesity:  Recommended Dietary Goals  Meghan Wilson is currently in the action stage of change. As such, her goal is to continue weight management plan. She has agreed to: continue current plan  Behavioral Intervention  We discussed the following Behavioral Modification Strategies today: continue to work on maintaining a reduced calorie state, getting the recommended  amount of protein, incorporating whole foods, making healthy choices, staying well hydrated and practicing mindfulness when eating..  Additional resources provided today: None  Recommended Physical Activity Goals  Meghan Wilson has been advised to work up to 150 minutes of moderate intensity aerobic activity a week and strengthening exercises 2-3 times per week for cardiovascular health, weight loss maintenance and preservation of muscle mass.   She has agreed to :  Think about enjoyable ways to increase daily physical activity and overcoming barriers to exercise and Increase physical activity in their day and reduce sedentary time (increase NEAT).  Pharmacotherapy  We discussed various medication options to help Meghan Wilson with her weight loss efforts and we both agreed to : adequate clinical response to current dose, continue current regimen  Associated Conditions Addressed Today  OSA on CPAP Assessment & Plan: Not tolerated CPAP due to influenza. Continue PAP therapy and current weight management strategy   Type 2 diabetes mellitus with obesity (HCC) Assessment & Plan: Her A1c is 5.5 and improved from 6.5.  She is currently on semaglutide 2 mg a week . No longer on Metformin, stopped by PCP.   She has reduced simple carbs and has been eating more salads.  She has lost 32 pounds close to 16% of her body weight.  She will continue semaglutide 2 mg.     Class 2 severe obesity with serious comorbidity and body mass index (BMI) of 35.0 to 35.9 in adult, unspecified obesity type [facility]) Assessment & Plan: Meghan Wilson has lost a total of 32 pounds which  is close to 16% of total body weight.   - Refer to obesity management plan - Continue with current weight management strategy. - Discussed adjustments to overcome identified barriers. - Reinforce dietary and exercise goals. .    Orders: -     Multi-Vitamin/Minerals; Take 1 tablet by mouth daily.    Objective   Physical Exam:  Blood pressure  139/83, pulse 98, temperature 97.8 F (36.6 C), height 5\' 2"  (1.575 m), weight 171 lb (77.6 kg), SpO2 97%. Body mass index is 31.28 kg/m.  General: She is overweight, cooperative, alert, well developed, and in no acute distress. PSYCH: Has normal mood, affect and thought process.   HEENT: EOMI, sclerae are anicteric. Lungs: Normal breathing effort, no conversational dyspnea. Extremities: No edema.  Neurologic: No gross sensory or motor deficits. No tremors or fasciculations noted.    Diagnostic Data Reviewed:  BMET    Component Value Date/Time   NA 137 10/09/2023 0443   NA 138 03/11/2023 0845   K 3.3 (L) 10/09/2023 0443   CL 104 10/09/2023 0443   CO2 23 10/09/2023 0443   GLUCOSE 122 (H) 10/09/2023 0443   BUN <5 (L) 10/09/2023 0443   BUN 9 03/11/2023 0845   CREATININE 0.55 10/09/2023 0443   CALCIUM 8.9 10/09/2023 0443   GFRNONAA >60 10/09/2023 0443   GFRAA 119 05/11/2020 0935   Lab Results  Component Value Date   HGBA1C 5.5 09/10/2023   HGBA1C 6.5 (H) 04/02/2018   Lab Results  Component Value Date   INSULIN 6.1 03/11/2023   INSULIN 5.8 11/21/2021   Lab Results  Component Value Date   TSH 0.976 11/21/2021   CBC    Component Value Date/Time   WBC 5.8 10/09/2023 0443   RBC 4.51 10/09/2023 0443   HGB 12.3 10/09/2023 0443   HGB 13.1 09/15/2023 0832   HGB 11.9 06/25/2022 1420   HCT 38.8 10/09/2023 0443   HCT 38.5 06/25/2022 1420   PLT 246 10/09/2023 0443   PLT 349 09/15/2023 0832   PLT 398 06/25/2022 1420   MCV 86.0 10/09/2023 0443   MCV 81 06/25/2022 1420   MCH 27.3 10/09/2023 0443   MCHC 31.7 10/09/2023 0443   RDW 15.0 10/09/2023 0443   RDW 13.5 06/25/2022 1420   Iron Studies    Component Value Date/Time   IRON 97 09/15/2023 0832   IRON 24 (L) 06/25/2022 1420   TIBC 403 09/15/2023 0832   TIBC 351 06/25/2022 1420   FERRITIN 169 09/15/2023 0833   FERRITIN 81 06/25/2022 1420   IRONPCTSAT 24 09/15/2023 0832   IRONPCTSAT 7 (LL) 06/25/2022 1420   Lipid  Panel     Component Value Date/Time   CHOL 144 09/04/2022 1027   TRIG 52 09/04/2022 1027   HDL 70 09/04/2022 1027   CHOLHDL 2.7 05/02/2021 1158   CHOLHDL 2.9 04/02/2018 1115   VLDL 51 (H) 04/02/2018 1115   LDLCALC 63 09/04/2022 1027   Hepatic Function Panel     Component Value Date/Time   PROT 6.5 03/11/2023 0845   ALBUMIN 4.4 03/11/2023 0845   AST 16 03/11/2023 0845   ALT 26 03/11/2023 0845   ALKPHOS 100 03/11/2023 0845   BILITOT 0.3 03/11/2023 0845      Component Value Date/Time   TSH 0.976 11/21/2021 1007   Nutritional Lab Results  Component Value Date   VD25OH 78.3 03/11/2023   VD25OH 54.3 09/04/2022   VD25OH 34.2 11/21/2021    Follow-Up   Return in about 4 weeks (around  11/16/2023) for For Weight Mangement with Dr. Rikki Spearing.Marland Kitchen She was informed of the importance of frequent follow up visits to maximize her success with intensive lifestyle modifications for her multiple health conditions.  Attestation Statement   Reviewed by clinician on day of visit: allergies, medications, problem list, medical history, surgical history, family history, social history, and previous encounter notes.     Worthy Rancher, MD

## 2023-10-19 NOTE — Progress Notes (Signed)
 Internal Medicine Clinic Attending  Case discussed with the resident at the time of the visit.  We reviewed the resident's history and exam and pertinent patient test results.  I agree with the assessment, diagnosis, and plan of care documented in the resident's note.

## 2023-10-23 ENCOUNTER — Other Ambulatory Visit (HOSPITAL_COMMUNITY): Payer: Self-pay

## 2023-10-23 ENCOUNTER — Encounter: Payer: Self-pay | Admitting: Nurse Practitioner

## 2023-10-26 ENCOUNTER — Emergency Department (HOSPITAL_COMMUNITY)
Admission: EM | Admit: 2023-10-26 | Discharge: 2023-10-26 | Disposition: A | Discharge status: Home / Self Care | Payer: MEDICAID | Attending: Emergency Medicine | Admitting: Emergency Medicine

## 2023-10-26 ENCOUNTER — Telehealth (INDEPENDENT_AMBULATORY_CARE_PROVIDER_SITE_OTHER): Payer: Self-pay | Admitting: *Deleted

## 2023-10-26 ENCOUNTER — Emergency Department (HOSPITAL_COMMUNITY): Payer: MEDICAID

## 2023-10-26 ENCOUNTER — Other Ambulatory Visit: Payer: Self-pay

## 2023-10-26 ENCOUNTER — Encounter (HOSPITAL_COMMUNITY): Payer: Self-pay

## 2023-10-26 DIAGNOSIS — M25512 Pain in left shoulder: Secondary | ICD-10-CM | POA: Insufficient documentation

## 2023-10-26 DIAGNOSIS — W010XXA Fall on same level from slipping, tripping and stumbling without subsequent striking against object, initial encounter: Secondary | ICD-10-CM | POA: Insufficient documentation

## 2023-10-26 DIAGNOSIS — M25561 Pain in right knee: Secondary | ICD-10-CM | POA: Diagnosis not present

## 2023-10-26 DIAGNOSIS — Z96651 Presence of right artificial knee joint: Secondary | ICD-10-CM | POA: Insufficient documentation

## 2023-10-26 DIAGNOSIS — M25461 Effusion, right knee: Secondary | ICD-10-CM | POA: Insufficient documentation

## 2023-10-26 DIAGNOSIS — M25552 Pain in left hip: Secondary | ICD-10-CM | POA: Insufficient documentation

## 2023-10-26 DIAGNOSIS — Z91040 Latex allergy status: Secondary | ICD-10-CM | POA: Insufficient documentation

## 2023-10-26 DIAGNOSIS — M25562 Pain in left knee: Secondary | ICD-10-CM | POA: Insufficient documentation

## 2023-10-26 DIAGNOSIS — Z96642 Presence of left artificial hip joint: Secondary | ICD-10-CM | POA: Insufficient documentation

## 2023-10-26 DIAGNOSIS — W19XXXA Unspecified fall, initial encounter: Secondary | ICD-10-CM

## 2023-10-26 NOTE — Telephone Encounter (Signed)
 Per Cover My Meds: Ozempic denied.

## 2023-10-26 NOTE — ED Provider Notes (Signed)
 [place] EMERGENCY DEPARTMENT AT [facility] Provider Note   CSN: [id number] Arrival date & time: 10/26/23  4098     History  Chief Complaint  Patient presents with   Marletta Lor    Meghan Wilson is a 63 y.o. female.   Fall  Presents to the ED today post mechanical fall 5 days ago complaining of L knee pain, R knee pain, L shoulder pain, and L hip pain, stating she landed on L side after slipping on ice. 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 Denies LOC, head trauma. Has been able to ambulate since fall.    Denies fever, headache, vision changes, chest pain, shortness of breath, abdominal pain, n/v, dysuria, numbness, tingling, weakness.   Home Medications Prior to Admission medications   Medication Sig Start Date End Date Taking? Authorizing Provider  Accu-Chek FastClix Lancets MISC use 1 strip to check blood sugar up to 7 times a week 02/01/22   Gwenevere Abbot, MD  atorvastatin (LIPITOR) 80 MG tablet Take 1 tablet (80 mg total) by mouth daily. 10/23/22   Gwenevere Abbot, MD  bacitracin-polymyxin b (POLYSPORIN) ophthalmic ointment Apply a small amount into the right eye 3 (three) times daily. 10/09/22     cycloSPORINE (RESTASIS) 0.05 % ophthalmic emulsion Place 1 drop into both eyes 2 (two) times daily. 07/01/23     doxepin (SINEQUAN) 10 MG capsule Take 1 capsule (10 mg total) by mouth at bedtime as needed for sleep 09/10/23     erythromycin ophthalmic ointment Apply a small amount into the right eye 3 (three) times daily. 10/09/22     ferrous sulfate 325 (65 FE) MG tablet Take 325 mg by mouth daily with breakfast.    [provider]  fluorometholone (FML) 0.1 % ophthalmic suspension Place 1 drop into the right eye 4 (four) times daily. 10/21/22     fluticasone (FLONASE) 50 MCG/ACT nasal spray Place 1 spray into both nostrils daily as needed for allergies or rhinitis 09/07/23 09/06/24  Gwenevere Abbot, MD  Glycerin-Hypromellose-PEG 400 (DRY EYE RELIEF DROPS) 0.2-0.2-1 %  SOLN Place 1 drop into both eyes in the morning and at bedtime.    [provider]  lamoTRIgine (LAMICTAL) 100 MG tablet Take 1 tablet (100 mg total) by mouth daily. 09/10/23     linaclotide (LINZESS) 145 MCG CAPS capsule Take 1 capsule (145 mcg total) by mouth daily before breakfast. 09/29/22   Armbruster, Willaim Rayas, MD  Multiple Vitamins-Minerals (MULTIVITAMIN WITH MINERALS) tablet Take 1 tablet by mouth daily. 10/19/23   Worthy Rancher, MD  ondansetron (ZOFRAN) 4 MG tablet Take 1 tablet (4 mg total) by mouth every 6 (six) hours as needed for nausea. 05/14/22   Cassandria Anger, PA-C  polyethylene glycol (MIRALAX / GLYCOLAX) 17 g packet Mix 1 packet (17 g total) in 8 oz clear liquid and drink by mouth daily as needed for mild constipation. 10/18/21   Cassandria Anger, PA-C  prazosin (MINIPRESS) 1 MG capsule Take 1 capsule (1 mg total) by mouth at bedtime. 09/10/23     Semaglutide, 2 MG/DOSE, 8 MG/3ML SOPN Inject 2 mg as directed once a week. 09/16/23   Worthy Rancher, MD  triamcinolone cream (KENALOG) 0.1 % Apply 1 Application topically daily. Apply underneath breast as needed 01/13/23   Rana Snare, DO  triamcinolone ointment (KENALOG) 0.5 % Apply 1 Application topically daily as needed. Apply to hands as needed. 01/13/23   Rana Snare, DO      Allergies  Aspirin, Depakote [divalproex sodium], Ibuprofen, Prednisone, and Latex    Review of Systems   Review of Systems  Musculoskeletal:  Positive for arthralgias and myalgias.  All other systems reviewed and are negative.   Physical Exam Updated Vital Signs BP (!) 145/81 (BP Location: Right Arm)   Pulse 90   Temp 98.1 F (36.7 C) (Oral)   Resp 18   Ht 5\' 2"  (1.575 m)   Wt 78.9 kg   SpO2 100%   BMI 31.83 kg/m  Physical Exam Vitals and nursing note reviewed.  Constitutional:      Appearance: Normal appearance.  HENT:     Head: Normocephalic and atraumatic.  Eyes:     Extraocular Movements: Extraocular movements  intact.     Conjunctiva/sclera: Conjunctivae normal.  Cardiovascular:     Rate and Rhythm: Normal rate and regular rhythm.     Pulses: Normal pulses.     Heart sounds: Normal heart sounds. No murmur heard.    No friction rub. No gallop.  Pulmonary:     Effort: Pulmonary effort is normal. No respiratory distress.     Breath sounds: Normal breath sounds.  Abdominal:     General: Abdomen is flat.     Palpations: Abdomen is soft.     Tenderness: There is no abdominal tenderness.  Musculoskeletal:        General: Swelling (mild R knee edema) and tenderness (Left knee tenderness) present. No deformity.  Skin:    General: Skin is warm and dry.     Coloration: Skin is not jaundiced or pale.     Findings: No bruising.  Neurological:     General: No focal deficit present.     Mental Status: She is alert and oriented to person, place, and time. Mental status is at baseline.     Sensory: No sensory deficit.     Motor: No weakness.     Gait: Gait normal.  Psychiatric:        Mood and Affect: Mood normal.     ED Results / Procedures / Treatments   Labs (all labs ordered are listed, but only abnormal results are displayed) Labs Reviewed - No data to display  EKG None  Radiology DG Hip Unilat W or Wo Pelvis 2-3 Views Left Result Date: 10/26/2023 CLINICAL DATA:  hip pain EXAM: DG HIP (WITH OR WITHOUT PELVIS) 2-3V LEFT COMPARISON:  None Available. FINDINGS: Total left hip arthroplasty. No radiographic findings suggest surgical hardware complication. There is no evidence of hip fracture or dislocation of the left hip. No acute displaced fracture or dislocation of the right hip on frontal view. No acute displaced fracture or diastasis of the bones of the pelvis. There is no evidence of severe arthropathy or other focal bone abnormality. Vascular calcification. IMPRESSION: Negative for acute traumatic injury. Electronically Signed   By: Tish Frederickson M.D.   On: 10/26/2023 09:36   DG Shoulder  Left Result Date: 10/26/2023 CLINICAL DATA:  shoulder pain EXAM: LEFT SHOULDER - 2+ VIEW COMPARISON:  None Available. FINDINGS: There is no evidence of fracture or dislocation. There is no evidence of arthropathy or other focal bone abnormality. Soft tissues are unremarkable. Punctate hyperdensity/calcification along the left axilla/breast-likely related to prior biopsy or benign calcifications (does not correlate with retained radiopaque foreign body given history). IMPRESSION: No acute displaced fracture or dislocation. Electronically Signed   By: Tish Frederickson M.D.   On: 10/26/2023 09:28   DG Knee Complete 4 Views Right Result Date: 10/26/2023 CLINICAL  DATA:  knee pain Left Hip pain, right knee pain, left Shoulder pain, and left knee pain Patient said last Wednesday she slipped on ice and fell onto her left buttocks and right knee. History of right knee replacement and left hip. EXAM: RIGHT KNEE - COMPLETE 4+ VIEW COMPARISON:  None Available. FINDINGS: Total right knee arthroplasty. No radiographic findings suggest surgical hardware complication. No evidence of fracture, dislocation, or joint effusion. No evidence of arthropathy or other focal bone abnormality. Soft tissues are unremarkable. Vascular calcifications. IMPRESSION: 1. No acute displaced fracture or dislocation. 2. Total right knee arthroplasty. Electronically Signed   By: Tish Frederickson M.D.   On: 10/26/2023 09:26   DG Knee Complete 4 Views Left Result Date: 10/26/2023 CLINICAL DATA:  Knee pain EXAM: LEFT KNEE - COMPLETE 4+ VIEW COMPARISON:  10/15/2020 FINDINGS: There is no joint effusion. No fracture or dislocation identified. Mild tricompartment osteoarthritis. Sclerotic lesion within the femoral diaphysis appears unchanged from previous exam. This may reflect a benign enchondroma or remote bone infarct. Vascular calcifications noted. IMPRESSION: 1. No acute findings. 2. Mild tricompartment osteoarthritis. Electronically Signed   By:  Signa Kell M.D.   On: 10/26/2023 09:16    Procedures Procedures   Medications Ordered in ED Medications - No data to display  ED Course/ Medical Decision Making/ A&P                                 Medical Decision Making Amount and/or Complexity of Data Reviewed Radiology: ordered.   This patient is a 64 year old female who presents to the ED for concern of left hip pain, left knee pain, left shoulder pain post mechanical fall after slipping on ice 5 days ago.   Differential diagnoses prior to evaluation: The emergent differential diagnosis includes, but is not limited to, fracture,. This is not an exhaustive differential.   Past Medical History / Co-morbidities / Social History:  anxiety, IBS, osteoarthritis, right shoulder pain, right knee replacement, left hip replacement, chronic knee pain after replacement  Additional history: Chart reviewed. Pertinent results include:   Last evaluated for fall on 05/23/2023.  Where she had not had CTs done.  Lab Tests/Imaging studies: I personally interpreted labs/imaging and the pertinent results include:   X-ray abdomen pelvis unremarkable X-ray left shoulder unremarkable X-ray of knees bilaterally unremarkable. I agree with the radiologist interpretation.    Medications: No medications needed for this visit.  I have reviewed the patients home medicines and have made adjustments as needed.   ED Course:  Patient is a 63 year old female presents to the ER today 5 days after mechanical fall after slipping on ice landing on left side without hitting head, no LOC.  States that she is not having left shoulder pain, left hip pain, bilateral knee pain.  Has ambulated since fall.  Came in today due to concerns for her hardware after left hip replacement and right knee replacement.  Has been known to have chronic pain in both knees since replacement.  Exam is notable for some mild edema noted to left knee but is otherwise unremarkable.   No neck pain, no trauma noted to head, no back pain.  X-rays were unremarkable showing no acute abnormalities.  Did show osteoarthritis.  Low suspicion for any acute or emergent pathology or trauma present at this time.  Recommend that she continue to follow-up with primary care outpatient for further arthritis evaluation and be discharged at this  time.  I believe patient is safe to discharge   Disposition: After consideration of the diagnostic results and the patients response to treatment, I feel that the patient would benefit from discharge and treatment as above.   emergency department workup does not suggest an emergent condition requiring admission or immediate intervention beyond what has been performed at this time. The plan is: Symptomatic management, return for any new or worsening symptoms. The patient is safe for discharge and has been instructed to return immediately for worsening symptoms, change in symptoms or any other concerns.    Final Clinical Impression(s) / ED Diagnoses Final diagnoses:  Fall, initial encounter    Rx / DC Orders ED Discharge Orders     None         Lavonia Drafts 10/26/23 1031    Anders Simmonds T, DO 10/28/23 0830

## 2023-10-26 NOTE — ED Triage Notes (Addendum)
 Patient said last Wednesday she slipped on ice and fell onto her left buttocks and right knee. History of right knee replacement and left hip. Has tried ice packs. Complaining of left hip pain and left knee pain and swelling and left shoulder. No LOC. No blood thinners. Ambulatory but painful.

## 2023-10-26 NOTE — Discharge Instructions (Addendum)
 You were seen today after a fall that happened 5 days ago.  Your x-rays were very reassuring and your physical exam was also very reassuring with no fractures or dislocations noted at this time.  Recommend that you continue to follow-up with your primary care provider your osteoarthritis pain.  You can take Tylenol for pain relief.  Take Tylenol (acetominophen)  650mg  every 4-6 hours, as needed for pain or fever. Do not take more than 4,000 mg in a 24-hour period. As this may cause liver damage. While this is rare, if you begin to develop yellowing of the skin or eyes, stop taking and return to ER immediately.  Return for any new or worsening symptoms.

## 2023-10-26 NOTE — Telephone Encounter (Signed)
 PA SUBMITTED VIA [employer]  Meghan Wilson (Key: [id number])   [facility] has not yet replied to your PA request. You may close this dialog box, return to your dashboard, and perform other tasks. To check for an update later, open this request again from your dashboard.  If [facility] has not replied to your urgent request within 24 hours, please contact [facility] at the provider services number on the [employer] card.

## 2023-10-28 ENCOUNTER — Telehealth (INDEPENDENT_AMBULATORY_CARE_PROVIDER_SITE_OTHER): Payer: Self-pay | Admitting: Internal Medicine

## 2023-10-28 ENCOUNTER — Ambulatory Visit: Payer: MEDICAID | Admitting: Internal Medicine

## 2023-10-28 ENCOUNTER — Encounter: Payer: Self-pay | Admitting: Nurse Practitioner

## 2023-10-28 ENCOUNTER — Other Ambulatory Visit (HOSPITAL_COMMUNITY): Payer: Self-pay

## 2023-10-28 DIAGNOSIS — W19XXXS Unspecified fall, sequela: Secondary | ICD-10-CM

## 2023-10-28 DIAGNOSIS — W19XXXA Unspecified fall, initial encounter: Secondary | ICD-10-CM | POA: Insufficient documentation

## 2023-10-28 DIAGNOSIS — M170 Bilateral primary osteoarthritis of knee: Secondary | ICD-10-CM | POA: Diagnosis not present

## 2023-10-28 MED ORDER — ACETAMINOPHEN-CODEINE 300-30 MG PO TABS
1.0000 | ORAL_TABLET | Freq: Four times a day (QID) | ORAL | 0 refills | Status: AC | PRN
  Filled 2023-10-28: qty 20, 5d supply, fill #0

## 2023-10-28 NOTE — Progress Notes (Deleted)
***       CC: ED follow-up after visit on 10/26/2023 for left-sided pain after a fall  HPI:  Linden Mikes is a 63 y.o. female with pertinent PMH of T2DM, class I obesity, OSA, HLD, bipolar disorder, and osteoarthritis who presents as above. Please see assessment and plan below for further details.  Review of Systems:   Pertinent items noted in HPI and/or A&P.  Physical Exam:  There were no vitals filed for this visit.  Constitutional:***. In no acute distress. HEENT: Normocephalic, atraumatic, Sclera non-icteric, PERRL, EOM intact Cardio:Regular rate and rhythm. 2+ bilateral {PulseLoc:28294} pulses. Pulm:Clear to auscultation bilaterally. Normal work of breathing on room air. Abdomen: Soft, non-tender, non-distended, positive bowel sounds. YQM:VHQIONGE for extremity edema. Skin:Warm and dry. Neuro:Alert and oriented x3. No focal deficit noted. Psych:Pleasant mood and affect.   Assessment & Plan:   No problem-specific Assessment & Plan notes found for this encounter.    Patient {GC/GE:[id number]::"discussed with","seen with"} Dr. {NAMES:[id number]::"Lau","Guilloud","Hoffman","Machen","Chambliss","Winfrey","Williams","Vincent"}  Rocky Morel, DO [facility] Internal Medicine Resident PGY-2 Clinic Phone: [phone] Pager: [phone]

## 2023-10-28 NOTE — Telephone Encounter (Signed)
 Patient called stating that she has to have a PA for her Ozempic. Please call patient regarding this medication. She needs a refill today as she is due her next dosage.

## 2023-10-28 NOTE — Progress Notes (Signed)
 CC: knee pain  HPI:  Meghan Wilson is a 63 y.o. female living with a history stated below and presents today for an acute visit for knee pain after she had a fall. Please see problem based assessment and plan for additional details.  Past Medical History:  Diagnosis Date   Anemia    Anxiety    Arthritis    Back pain    Bipolar depression (HCC) 09/2015   Chronic knee pain after total replacement of right knee joint 10/02/2021   Constipation    Depression 06/27/2021   Dizziness    Generalized abdominal discomfort 05/06/2021   History of colon polyps    History of kidney stones    History of patellar fracture 2016   History of tibial fracture 2016   Hyperlipidemia    IBS (irritable bowel syndrome)    Joint pain    Night terrors    Night terrors, adult 09/2015   OSA (obstructive sleep apnea) 04/07/2022   Osteoarthritis    Pain in joint of right shoulder 09/16/2021   Pain of right knee after injury 03/10/2018   Post concussion syndrome 12/20/2019   Pre-diabetes    S/P hardware removal, right tibia 09/17/2021   S/P total knee arthroplasty, 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 30 tablet 11   bacitracin-polymyxin b (POLYSPORIN) ophthalmic ointment Apply a small amount into the right eye 3 (three) times daily. 3.5 g 0   cycloSPORINE (RESTASIS) 0.05 % ophthalmic emulsion Place 1 drop into both eyes 2 (two) times daily. 180 each 3   doxepin (SINEQUAN) 10 MG capsule Take 1 capsule (10 mg total) by mouth at bedtime as needed for sleep 30 capsule 3    erythromycin ophthalmic ointment Apply a small amount into the right eye 3 (three) times daily. 3.5 g 0   ferrous sulfate 325 (65 FE) MG tablet Take 325 mg by mouth daily with breakfast.     fluorometholone (FML) 0.1 % ophthalmic suspension Place 1 drop into the right eye 4 (four) times daily. 5 mL 0   fluticasone (FLONASE) 50 MCG/ACT nasal spray Place 1 spray into both nostrils daily as needed for allergies or rhinitis 16 g 2   Glycerin-Hypromellose-PEG 400 (DRY EYE RELIEF DROPS) 0.2-0.2-1 % SOLN Place 1 drop into both eyes in the morning and at bedtime.     lamoTRIgine (LAMICTAL) 100 MG tablet Take 1 tablet (100 mg total) by mouth daily. 30 tablet 3   linaclotide (LINZESS) 145 MCG CAPS capsule Take 1 capsule (145 mcg total) by mouth daily before breakfast. 30 capsule 11   Multiple Vitamins-Minerals (MULTIVITAMIN WITH MINERALS) tablet Take 1 tablet by mouth daily.     ondansetron (ZOFRAN) 4 MG tablet Take 1 tablet (4 mg total) by mouth every 6 (six) hours as needed for nausea. 20 tablet 0   polyethylene glycol (MIRALAX / GLYCOLAX) 17 g packet Mix 1 packet (17 g total) in 8 oz clear liquid and drink by mouth daily  as needed for mild constipation. 14 each 0   prazosin (MINIPRESS) 1 MG capsule Take 1 capsule (1 mg total) by mouth at bedtime. 30 capsule 3   Semaglutide, 2 MG/DOSE, 8 MG/3ML SOPN Inject 2 mg as directed once a week. 3 mL 1   triamcinolone cream (KENALOG) 0.1 % Apply 1 Application topically daily. Apply underneath breast as needed 45 g 2   triamcinolone ointment (KENALOG) 0.5 % Apply 1 Application topically daily as needed. Apply to hands as needed. 30 g 2   No current facility-administered medications on file prior to visit.    Family History  Problem Relation Age of Onset   Hyperlipidemia Mother    Hypertension Mother    Colon polyps Mother    Heart disease Mother    Diabetes Mother    Depression Mother    Anxiety disorder Mother    Bipolar disorder Mother    Eating  disorder Mother    Obesity Mother    Arthritis/Rheumatoid Father    Hyperlipidemia Maternal Aunt    Hypertension Maternal Aunt    Cancer Maternal Aunt        "blood cancer"   Colon polyps Maternal Grandmother    Stroke Other    Colon cancer Neg Hx    Esophageal cancer Neg Hx    Stomach cancer Neg Hx    Rectal cancer Neg Hx    Sleep apnea Neg Hx    Breast cancer Neg Hx     Social History   Socioeconomic History   Marital status: Single    Spouse name: Not on file   Number of children: 2   Years of education: 2 years of college   Highest education level: Not on file  Occupational History   Not on file  Tobacco Use   Smoking status: Former    Current packs/day: 0.00    Average packs/day: 0.3 packs/day for 7.0 years (1.8 ttl pk-yrs)    Types: Cigarettes    Start date: 12/22/2010    Quit date: 12/21/2017    Years since quitting: 5.8   Smokeless tobacco: Never  Vaping Use   Vaping status: Never Used  Substance and Sexual Activity   Alcohol use: Yes    Comment: special occasions   Drug use: No   Sexual activity: Yes    Birth control/protection: Surgical    Comment: Celibate  Other Topics Concern   Not on file  Social History Narrative   Lives at home with her fiance.   Right-handed.   No daily use of caffeine.   Social Drivers of Corporate investment banker Strain: Not on file  Food Insecurity: No Food Insecurity (09/25/2022)   Hunger Vital Sign    Worried About Running Out of Food in the Last Year: Never true    Ran Out of Food in the Last Year: Never true  Transportation Needs: No Transportation Needs (05/14/2022)   PRAPARE - Administrator, Civil Service (Medical): No    Lack of Transportation (Non-Medical): No  Physical Activity: Not on file  Stress: Not on file  Social Connections: Moderately Isolated (09/25/2022)   Social Connection and Isolation Panel [NHANES]    Frequency of Communication with Friends and Family: More than three times a week     Frequency of Social Gatherings with Friends and Family: More than three times a week    Attends Religious Services: More than 4 times per year    Active Member of [employer] or Organizations:  Not on file    Attends Club or Organization Meetings: Never    Marital Status: Divorced  Intimate Partner Violence: Not At Risk (09/25/2022)   Humiliation, Afraid, Rape, and Kick questionnaire    Fear of Current or Ex-Partner: No    Emotionally Abused: No    Physically Abused: No    Sexually Abused: No    Review of Systems: ROS negative except for what is noted on the assessment and plan.  There were no vitals filed for this visit.  Physical Exam: Constitutional: appears well, no acute distress Pulmonary/Chest: normal work of breathing on room air MSK: R knee with scar from previous knee replacement; left knee is significantly swollen in medial compartment and tender to palpation; walks with antalgic gait Neurological: alert & oriented x 3, no focal deficit Skin: warm and dry Psych: normal mood and behavior  Assessment & Plan:   Patient discussed with Dr. Cleda Daub  Fall The patient presents to the [facility] for an acute visit after a fall on 2/19.  She states that she had snow on the bottom of her shoe and when she walked in her house, she slipped and fell.  She tried to brace her fall, but ended up twisting her leg and landed on her left buttock/hip and hit her left shoulder as well.  She did not think she needed to go to the emergency department, and iced her knee and took Tylenol, but her pain got worse and she had to go to the ED on 2/24.  In the ED, x-ray of bilateral knees were unremarkable, x-ray of left shoulder was unremarkable, and x-ray of abdomen pelvis was unremarkable.  She was advised to rest and to follow-up with her primary care doctor.  Today, the patient reports continued pain in her left knee.  She states that Tylenol is not helping, nor are ice packs.  She is walking with a cane  now, and walks with an antalgic gait. She does have shoulder pain, but this is well managed and she is more bothered by the continued knee pain. On exam, her left knee is significantly swollen in the medial compartment and she is tender to palpation throughout her knee.  Plan: - Tylenol #3 ordered for 5 day course for acute pain - Follow-up with orthopedic surgery (for possible aspiration of knee)   Vaughan Garfinkle, D.O. [facility], PGY-3 Phone: [phone] Date 10/28/2023 Time 1:21 PM

## 2023-10-28 NOTE — Assessment & Plan Note (Signed)
 The patient presents to the [facility] for an acute visit after a fall on 2/19.  She states that she had snow on the bottom of her shoe and when she walked in her house, she slipped and fell.  She tried to brace her fall, but ended up twisting her leg and landed on her left buttock/hip and hit her left shoulder as well.  She did not think she needed to go to the emergency department, and iced her knee and took Tylenol, but her pain got worse and she had to go to the ED on 2/24.  In the ED, x-ray of bilateral knees were unremarkable, x-ray of left shoulder was unremarkable, and x-ray of abdomen pelvis was unremarkable.  She was advised to rest and to follow-up with her primary care doctor.  Today, the patient reports continued pain in her left knee.  She states that Tylenol is not helping, nor are ice packs.  She is walking with a cane now, and walks with an antalgic gait. She does have shoulder pain, but this is well managed and she is more bothered by the continued knee pain. On exam, her left knee is significantly swollen in the medial compartment and she is tender to palpation throughout her knee.  Plan: - Tylenol #3 ordered for 5 day course for acute pain - Follow-up with orthopedic surgery (for possible aspiration of knee)

## 2023-10-28 NOTE — Patient Instructions (Signed)
 Thank you, Ms.Merri Sonnenberg for allowing [place] to provide your care today. Today we discussed:  Knee pain I have prescribed Tylenol 3's for you to take every 6 hours as needed for a few days I have placed a new referral for you to see orthopedics   I have ordered the following labs for you:  Lab Orders  No laboratory test(s) ordered today    Referrals ordered today:    Referral Orders         Ambulatory referral to Orthopedic Surgery      I have ordered the following medication/changed the following medications:   Stop the following medications: There are no discontinued medications.   Start the following medications: Meds ordered this encounter  Medications   acetaminophen-codeine (TYLENOL #3) 300-30 MG tablet    Sig: Take 1 tablet by mouth every 6 (six) hours as needed for up to 5 days for moderate pain (pain score 4-6).    Dispense:  20 tablet    Refill:  0     Follow up:  if pain worsens or fails to improve      Should you have any questions or concerns please call the internal medicine clinic at [phone].     Elza Rafter, D.O. [facility]

## 2023-10-29 NOTE — Telephone Encounter (Signed)
 Called pt and advised that it has been denied and may cal insurance company with questions; more than likely to due to A1c levels

## 2023-10-29 NOTE — Telephone Encounter (Signed)
 Patient called in requesting that someone call her about her medication PA because she is due to take her medication today. Please follow up with the patient.

## 2023-11-01 NOTE — Progress Notes (Signed)
 Internal Medicine Clinic Attending  Case discussed with the resident at the time of the visit.  We reviewed the resident's history and exam and pertinent patient test results.  I agree with the assessment, diagnosis, and plan of care documented in the resident's note.

## 2023-11-10 ENCOUNTER — Telehealth (INDEPENDENT_AMBULATORY_CARE_PROVIDER_SITE_OTHER): Payer: Self-pay | Admitting: Internal Medicine

## 2023-11-10 NOTE — Telephone Encounter (Signed)
 Patient called in asking for Misty Stanley to call her back at [phone]. York Spaniel this is in reference to her prior authorization.

## 2023-11-11 ENCOUNTER — Telehealth (INDEPENDENT_AMBULATORY_CARE_PROVIDER_SITE_OTHER): Payer: Self-pay

## 2023-11-11 NOTE — Telephone Encounter (Signed)
 Spoke to pt and resending Ozempic PA

## 2023-11-11 NOTE — Telephone Encounter (Signed)
 PA for Ozempic sent in again

## 2023-11-12 NOTE — Telephone Encounter (Signed)
 PA - Ozempic approved, pt notified

## 2023-11-13 ENCOUNTER — Other Ambulatory Visit (HOSPITAL_COMMUNITY): Payer: Self-pay

## 2023-11-16 ENCOUNTER — Ambulatory Visit (INDEPENDENT_AMBULATORY_CARE_PROVIDER_SITE_OTHER): Payer: MEDICAID | Admitting: Internal Medicine

## 2023-11-22 ENCOUNTER — Other Ambulatory Visit: Payer: Self-pay

## 2023-11-22 ENCOUNTER — Encounter (HOSPITAL_COMMUNITY): Payer: Self-pay | Admitting: *Deleted

## 2023-11-22 ENCOUNTER — Emergency Department (HOSPITAL_COMMUNITY)
Admission: EM | Admit: 2023-11-22 | Discharge: 2023-11-22 | Disposition: A | Discharge status: Home / Self Care | Payer: MEDICAID | Attending: Emergency Medicine | Admitting: Emergency Medicine

## 2023-11-22 DIAGNOSIS — Z91040 Latex allergy status: Secondary | ICD-10-CM | POA: Insufficient documentation

## 2023-11-22 DIAGNOSIS — R21 Rash and other nonspecific skin eruption: Secondary | ICD-10-CM | POA: Diagnosis present

## 2023-11-22 MED ORDER — CEPHALEXIN 500 MG PO CAPS
500.0000 mg | ORAL_CAPSULE | Freq: Four times a day (QID) | ORAL | 0 refills | Status: AC
Start: 2023-11-22 — End: 2023-11-30
  Filled 2023-11-22: qty 28, 7d supply, fill #0

## 2023-11-22 NOTE — ED Provider Notes (Signed)
 [place] EMERGENCY DEPARTMENT AT [facility] Provider Note   CSN: [id number] Arrival date & time: 11/22/23  4098     History Chief Complaint  Patient presents with   Abscess    Meghan Wilson is a 63 y.o. female self reportedly otherwise healthy presents emergency department today for evaluation of rash in her gluteal cleft.  Patient reports been present for the past 10 days.  She reports that it is itching and burning.  She was putting Aquaphor on the area but did not have much relief with this.  She reports has been having some clear discharge as well.  Denies any belly pain, fevers, trouble with defecation or urination.  Denies any abnormal vaginal discharge.  She has only tried the Aquaphor for her symptoms.  No other medications or treatment trialed.   Abscess      Home Medications Prior to Admission medications   Medication Sig Start Date End Date Taking? Authorizing Provider  Accu-Chek FastClix Lancets MISC use 1 strip to check blood sugar up to 7 times a week 02/01/22   Gwenevere Abbot, MD  atorvastatin (LIPITOR) 80 MG tablet Take 1 tablet (80 mg total) by mouth daily. 10/23/22   Gwenevere Abbot, MD  bacitracin-polymyxin b (POLYSPORIN) ophthalmic ointment Apply a small amount into the right eye 3 (three) times daily. 10/09/22     cycloSPORINE (RESTASIS) 0.05 % ophthalmic emulsion Place 1 drop into both eyes 2 (two) times daily. 07/01/23     doxepin (SINEQUAN) 10 MG capsule Take 1 capsule (10 mg total) by mouth at bedtime as needed for sleep 09/10/23     erythromycin ophthalmic ointment Apply a small amount into the right eye 3 (three) times daily. 10/09/22     ferrous sulfate 325 (65 FE) MG tablet Take 325 mg by mouth daily with breakfast.    [provider]  fluorometholone (FML) 0.1 % ophthalmic suspension Place 1 drop into the right eye 4 (four) times daily. 10/21/22     fluticasone (FLONASE) 50 MCG/ACT nasal spray Place 1 spray into both nostrils daily as needed for  allergies or rhinitis 09/07/23 09/06/24  Gwenevere Abbot, MD  Glycerin-Hypromellose-PEG 400 (DRY EYE RELIEF DROPS) 0.2-0.2-1 % SOLN Place 1 drop into both eyes in the morning and at bedtime.    [provider]  lamoTRIgine (LAMICTAL) 100 MG tablet Take 1 tablet (100 mg total) by mouth daily. 09/10/23     linaclotide (LINZESS) 145 MCG CAPS capsule Take 1 capsule (145 mcg total) by mouth daily before breakfast. 09/29/22   Armbruster, Willaim Rayas, MD  Multiple Vitamins-Minerals (MULTIVITAMIN WITH MINERALS) tablet Take 1 tablet by mouth daily. 10/19/23   Worthy Rancher, MD  ondansetron (ZOFRAN) 4 MG tablet Take 1 tablet (4 mg total) by mouth every 6 (six) hours as needed for nausea. 05/14/22   Cassandria Anger, PA-C  polyethylene glycol (MIRALAX / GLYCOLAX) 17 g packet Mix 1 packet (17 g total) in 8 oz clear liquid and drink by mouth daily as needed for mild constipation. 10/18/21   Cassandria Anger, PA-C  prazosin (MINIPRESS) 1 MG capsule Take 1 capsule (1 mg total) by mouth at bedtime. 09/10/23     Semaglutide, 2 MG/DOSE, 8 MG/3ML SOPN Inject 2 mg as directed once a week. 09/16/23   Worthy Rancher, MD  triamcinolone cream (KENALOG) 0.1 % Apply 1 Application topically daily. Apply underneath breast as needed 01/13/23   Rana Snare, DO  triamcinolone ointment (KENALOG) 0.5 % Apply 1 Application topically daily  as needed. Apply to hands as needed. 01/13/23   Rana Snare, DO      Allergies    Aspirin, Depakote [divalproex sodium], Ibuprofen, Prednisone, and Latex    Review of Systems   Review of Systems  Physical Exam Updated Vital Signs BP 124/88 (BP Location: Right Arm)   Pulse 93   Temp 97.9 F (36.6 C) (Oral)   Resp 17   Ht 5\' 2"  (1.575 m)   Wt 78.9 kg   SpO2 100%   BMI 31.83 kg/m  Physical Exam Vitals and nursing note reviewed. Exam conducted with a chaperone present Vernona Rieger, PA student).  Constitutional:      General: She is not in acute distress.    Appearance: She is not  ill-appearing or toxic-appearing.     Comments: Sitting in chair not appearing comfortable in no acute distress.  HENT:     Mouth/Throat:     Mouth: Mucous membranes are moist.  Pulmonary:     Effort: Pulmonary effort is normal. No respiratory distress.  Abdominal:     Palpations: Abdomen is soft.     Tenderness: There is no abdominal tenderness.  Skin:    General: Skin is warm and dry.     Comments: Some skin breakdown seen at the gluteal cleft.  Around 4 inches superior to the anus.  I do not see any blistering or ulceration near the rectum or perineum.  Nothing present in the genitals.  There is some slight surrounding erythema but no increase in warmth.  There is no induration or fluctuance.  Has some tenderness to palpation.  No active discharge noted.  No skin sloughing.  Neurological:     Mental Status: She is alert.       ED Results / Procedures / Treatments   Labs (all labs ordered are listed, but only abnormal results are displayed) Labs Reviewed  HSV CULTURE AND TYPING    EKG None  Radiology No results found.  Procedures Procedures   Medications Ordered in ED Medications - No data to display  ED Course/ Medical Decision Making/ A&P                                Medical Decision Making Amount and/or Complexity of Data Reviewed Labs: ordered.  Risk Prescription drug management.   63 y.o. female presents to the ER today for evaluation of rash. Differential diagnosis includes but is not limited to SJS, TENS, contact dermatitic, allergic dermatitis, urticaria, shingles, RMSF, necrotizing fascitis, HSV, skin breakdown. Vital signs unremarkable. Physical exam as noted above.   Please see image.  I do not see any active discharge.  Nontender to palpation.  There is no induration or fluctuance.  Some very minimal surrounding erythema however no increase in warmth.  Around 4 inches away from anus superiorly in the gluteal cleft.  There is no other ulcerations  seen to the genital area.  She is having no problems with defecation or urination.  No fevers or belly pain.  She has no skin sloughing.  No blistering seen.  I question if she was having some skin breakdown due to the location.  She could have had some moisture in the area that would have caused skin breakdown.  I will prescribe her some Keflex for any cellulitis as well as recommended some topical Desitin.  Recommended following up with her PCP for reevaluation next few days.  Discussed keeping the area dry.  There is no skin sloughing.  There is no fluctuance induration.  I doubt any abscess.  She has no problem with defecation or urination, doubt any fistula.  I have obtained an HSV culture of an abundance of precaution however there are no vesicles or blistering seen.  There are no other rashes on the patient's body or mouth.  I doubt any SJS or TENS.  We discussed plan at bedside. We discussed strict return precautions and red flag symptoms. The patient verbalized their understanding and agrees to the plan. The patient is stable and being discharged home in good condition.  Portions of this report may have been transcribed using voice recognition software. Every effort was made to ensure accuracy; however, inadvertent computerized transcription errors may be present.   I discussed this case with my attending physician who cosigned this note including patient's presenting symptoms, physical exam, and planned diagnostics and interventions. Attending physician stated agreement with plan or made changes to plan which were implemented.   Final Clinical Impression(s) / ED Diagnoses Final diagnoses:  Rash    Rx / DC Orders ED Discharge Orders          Ordered    cephALEXin (KEFLEX) 500 MG capsule  4 times daily        11/22/23 1054              Achille Rich, [place] 11/22/23 1814    Vanetta Mulders, MD 11/23/23 2013

## 2023-11-22 NOTE — Discharge Instructions (Addendum)
 You were seen in the ER for evaluation of your rash. This appears to be some skin break down. I am going to prescribe you an antibiotic to take for the next 7 days. I want you to clean the area at least once daily with Dial soap and water.  When you get out of the shower finished cleaning the area, please sit in front of a fan to make sure the area is dry and then you can apply Desitin to the area.  This will help with some of the pain you are feeling.  If you start to notice that the area is worsening, start to becoming swollen or hard, belly pain, fevers, abnormal vaginal discharge or pain or trouble with defecation or urination, please return to the emergency department immediately for reevaluation.  If you have any other concerns, new or worsening symptoms, please return to your nearest emergency department for reevaluation.  Contact a health care provider if: You sweat at night more than normal. You pee (urinate) more or less than normal, or your pee is a darker color than normal. Your eyes become sensitive to light. Your skin or the white parts of your eyes turn yellow (jaundice). Your skin tingles or is numb. You get painful blisters in your nose or mouth. Your rash does not go away after a few days, or it gets worse. You are more tired or thirsty than normal. You have new or worse symptoms. These may include: Pain in your abdomen. Fever. Diarrhea or vomiting. Weakness or weight loss. Get help right away if: You get confused. You have a severe headache, a stiff neck, or severe joint pain or stiffness. You become very sleepy or not responsive. You have a seizure.

## 2023-11-22 NOTE — ED Notes (Signed)
 Spoke with Kon in lab to determine type of swab for Hsv Culture and Typing. Clarified the same swab as for Covid.

## 2023-11-22 NOTE — ED Triage Notes (Signed)
 Abscess draining between buttock folds.Has been present for over a week, now states it feels like a knot.

## 2023-11-22 NOTE — ED Notes (Signed)
 Culture sent to lab

## 2023-11-23 ENCOUNTER — Other Ambulatory Visit: Payer: Self-pay | Admitting: Gastroenterology

## 2023-11-23 ENCOUNTER — Other Ambulatory Visit (HOSPITAL_COMMUNITY): Payer: Self-pay

## 2023-11-23 MED ORDER — LINACLOTIDE 145 MCG PO CAPS
145.0000 ug | ORAL_CAPSULE | Freq: Every day | ORAL | 1 refills | Status: DC
  Filled 2023-11-23: qty 30, 30d supply, fill #0
  Filled 2024-02-16: qty 30, 30d supply, fill #1

## 2023-11-24 LAB — HSV CULTURE AND TYPING: Source of Sample: 8250

## 2023-11-25 ENCOUNTER — Other Ambulatory Visit (HOSPITAL_COMMUNITY): Payer: Self-pay

## 2023-11-25 ENCOUNTER — Other Ambulatory Visit: Payer: Self-pay | Admitting: Internal Medicine

## 2023-11-25 DIAGNOSIS — E785 Hyperlipidemia, unspecified: Secondary | ICD-10-CM

## 2023-11-25 MED ORDER — ATORVASTATIN CALCIUM 80 MG PO TABS
80.0000 mg | ORAL_TABLET | Freq: Every day | ORAL | 11 refills | Status: DC
  Filled 2023-11-25: qty 30, 30d supply, fill #0

## 2023-11-25 NOTE — Telephone Encounter (Signed)
 Medication sent to pharmacy

## 2023-12-03 ENCOUNTER — Other Ambulatory Visit (HOSPITAL_COMMUNITY): Payer: Self-pay

## 2023-12-03 ENCOUNTER — Ambulatory Visit (INDEPENDENT_AMBULATORY_CARE_PROVIDER_SITE_OTHER): Payer: MEDICAID | Admitting: Internal Medicine

## 2023-12-03 ENCOUNTER — Telehealth (INDEPENDENT_AMBULATORY_CARE_PROVIDER_SITE_OTHER): Payer: Self-pay | Admitting: Internal Medicine

## 2023-12-03 ENCOUNTER — Encounter (INDEPENDENT_AMBULATORY_CARE_PROVIDER_SITE_OTHER): Payer: Self-pay | Admitting: Internal Medicine

## 2023-12-03 ENCOUNTER — Other Ambulatory Visit (INDEPENDENT_AMBULATORY_CARE_PROVIDER_SITE_OTHER): Payer: Self-pay | Admitting: Internal Medicine

## 2023-12-03 VITALS — BP 122/74 | HR 93 | Temp 97.6°F | Ht 62.0 in | Wt 171.0 lb

## 2023-12-03 DIAGNOSIS — E7801 Familial hypercholesterolemia: Secondary | ICD-10-CM

## 2023-12-03 DIAGNOSIS — E66812 Obesity, class 2: Secondary | ICD-10-CM

## 2023-12-03 DIAGNOSIS — Z6835 Body mass index (BMI) 35.0-35.9, adult: Secondary | ICD-10-CM

## 2023-12-03 DIAGNOSIS — E1169 Type 2 diabetes mellitus with other specified complication: Secondary | ICD-10-CM

## 2023-12-03 DIAGNOSIS — Z7985 Long-term (current) use of injectable non-insulin antidiabetic drugs: Secondary | ICD-10-CM

## 2023-12-03 DIAGNOSIS — G4733 Obstructive sleep apnea (adult) (pediatric): Secondary | ICD-10-CM | POA: Diagnosis not present

## 2023-12-03 MED ORDER — ZOSTER VAC RECOMB ADJUVANTED 50 MCG/0.5ML IM SUSR
0.5000 mL | Freq: Once | INTRAMUSCULAR | 0 refills | Status: AC
  Filled 2023-12-03: qty 0.5, 1d supply, fill #0

## 2023-12-03 MED ORDER — SEMAGLUTIDE (2 MG/DOSE) 8 MG/3ML ~~LOC~~ SOPN
2.0000 mg | PEN_INJECTOR | SUBCUTANEOUS | 1 refills | Status: DC
  Filled 2023-12-03 – 2023-12-08 (×2): qty 3, 28d supply, fill #0
  Filled 2024-01-01: qty 3, 28d supply, fill #1

## 2023-12-03 MED ORDER — SEMAGLUTIDE (2 MG/DOSE) 8 MG/3ML ~~LOC~~ SOPN
2.0000 mg | PEN_INJECTOR | SUBCUTANEOUS | 1 refills | Status: DC
  Filled 2023-12-03: qty 3, 28d supply, fill #0

## 2023-12-03 NOTE — Progress Notes (Signed)
 Office: [phone]  /  Fax: [phone]  Weight Summary And Biometrics  Vitals Temp: 97.6 F (36.4 C) BP: 122/74 Pulse Rate: 93 SpO2: 95 %   Anthropometric Measurements Height: 5\' 2"  (1.575 m) Weight: 171 lb (77.6 kg) BMI (Calculated): 31.27 Weight at Last Visit: 171 lb Weight Lost Since Last Visit: 0 Weight Gained Since Last Visit: 0 Starting Weight: 195 lb Total Weight Loss (lbs): 24 lb (10.9 kg) Peak Weight: 207 lb   Body Composition  Body Fat %: 43.6 % Fat Mass (lbs): 74.8 lbs Muscle Mass (lbs): 91.6 lbs Total Body Water (lbs): 64.6 lbs Visceral Fat Rating : 12    No data recorded Today's Visit #: 31  Starting Date: 11/21/21   Subjective   Chief Complaint: Obesity  Interval History Discussed the use of AI scribe software for clinical note transcription with the patient, who gave verbal consent to proceed.  History of Present Illness Meghan Wilson is a 63 year old female with obesity, OSA, type 2 diabetes, and hyperlipidemia who presents for medical weight management.  She is actively engaged in a medical weight management program, adhering to a category two meal plan about 80% of the time, which emphasizes whole foods, adequate protein intake, and hydration. She exercises twice a week for approximately 60 minutes, incorporating both cardio and yoga. She has successfully lost 24 pounds through this medically supervised plan.  She is currently taking semaglutide 2 mg daily, primarily for diabetes management and weight control. There was a temporary discontinuation of the medication for three to four weeks due to insurance issues, during which she experienced increased hunger and food intake. She has since resumed the medication.  She experiences high levels of anxiety, partly due to concerns about a cyst on her arm, previously diagnosed by an orthopedic specialist, and the potential need for surgery. Additionally, her mother's health issues contribute to  her stress. She reports sleeping five to six hours per night.    Challenges affecting patient progress:  entering a plateu .    Pharmacotherapy for weight management: She is currently taking Ozempic with diabetes as the primary indication with adequate clinical response  and without side effects..   Assessment and Plan   Treatment Plan For Obesity:  Recommended Dietary Goals  Meghan Wilson is currently in the action stage of change. As such, her goal is to continue weight management plan. She has agreed to: Reduce calories follow the Category 1 plan - 1000 kcal per day  Behavioral Health and Counseling  We discussed the following behavioral modification strategies today: increasing lean protein intake to established goals and work on tracking and journaling calories using tracking application.  Additional education and resources provided today: Handout and personalized instruction on tracking and journaling using Apps and New 1000 calorie plan  Recommended Physical Activity Goals  Meghan Wilson has been advised to work up to 150 minutes of moderate intensity aerobic activity a week and strengthening exercises 2-3 times per week for cardiovascular health, weight loss maintenance and preservation of muscle mass.   She has agreed to :  continue to gradually increase the amount and intensity of exercise routine  Pharmacotherapy  We discussed various medication options to help Meghan Wilson with her weight loss efforts and we both agreed to : adequate clinical response to current dose, continue current regimen  Associated Conditions Impacted by Obesity Treatment  OSA on CPAP  Type 2 diabetes mellitus with obesity (HCC) -     Semaglutide (2 MG/DOSE); Inject 2 mg as  directed once a week.  Dispense: 3 mL; Refill: 1  Familial hypercholesterolemia  Class 2 severe obesity with serious comorbidity and body mass index (BMI) of 35.0 to 35.9 in adult, unspecified obesity type (HCC)    Assessment and  Plan Assessment & Plan Obesity She achieved an 18% weight reduction with a current BMI of 31. Weight loss plateau likely due to caloric intake matching maintenance needs. Maximum benefit from semaglutide achieved. - Switch to a category one meal plan with 1000 calories per day. - Use MyNet Diary app for calorie tracking. - Replace one meal with a protein shake to reduce caloric intake. - Continue semaglutide (Ozempic) 2 mg daily. - Increase physical activity progressively.  Type 2 Diabetes Mellitus Diabetes control has improved on semaglutide.  Insurance issues caused medication access interruptions. Medication essential for A1c and weight maintenance. - Ensure insurance coverage for semaglutide.  Obstructive Sleep Apnea (OSA) Patient has lost 18% of total body weight.  She is using CPAP therapy.  Continue CPAP consider repeat sleep study to see if she still needs PAP therapy.   General Health Maintenance Encouraged to maintain a balanced diet with whole grains and complex carbohydrates. Advised to limit avocado intake due to high caloric content. - Limit avocado intake to a quarter per serving. - Choose whole grains and complex carbohydrates over refined options.      Objective   Physical Exam:  Blood pressure 122/74, pulse 93, temperature 97.6 F (36.4 C), height 5\' 2"  (1.575 m), weight 171 lb (77.6 kg), SpO2 95%. Body mass index is 31.28 kg/m.  General: She is overweight, cooperative, alert, well developed, and in no acute distress. PSYCH: Has normal mood, affect and thought process.   HEENT: EOMI, sclerae are anicteric. Lungs: Normal breathing effort, no conversational dyspnea. Extremities: No edema.  Neurologic: No gross sensory or motor deficits. No tremors or fasciculations noted.    Diagnostic Data Reviewed:  BMET    Component Value Date/Time   NA 137 10/09/2023 0443   NA 138 03/11/2023 0845   K 3.3 (L) 10/09/2023 0443   CL 104 10/09/2023 0443   CO2 23  10/09/2023 0443   GLUCOSE 122 (H) 10/09/2023 0443   BUN <5 (L) 10/09/2023 0443   BUN 9 03/11/2023 0845   CREATININE 0.55 10/09/2023 0443   CALCIUM 8.9 10/09/2023 0443   GFRNONAA >60 10/09/2023 0443   GFRAA 119 05/11/2020 0935   Lab Results  Component Value Date   HGBA1C 5.5 09/10/2023   HGBA1C 6.5 (H) 04/02/2018   Lab Results  Component Value Date   INSULIN 6.1 03/11/2023   INSULIN 5.8 11/21/2021   Lab Results  Component Value Date   TSH 0.976 11/21/2021   CBC    Component Value Date/Time   WBC 5.8 10/09/2023 0443   RBC 4.51 10/09/2023 0443   HGB 12.3 10/09/2023 0443   HGB 13.1 09/15/2023 0832   HGB 11.9 06/25/2022 1420   HCT 38.8 10/09/2023 0443   HCT 38.5 06/25/2022 1420   PLT 246 10/09/2023 0443   PLT 349 09/15/2023 0832   PLT 398 06/25/2022 1420   MCV 86.0 10/09/2023 0443   MCV 81 06/25/2022 1420   MCH 27.3 10/09/2023 0443   MCHC 31.7 10/09/2023 0443   RDW 15.0 10/09/2023 0443   RDW 13.5 06/25/2022 1420   Iron Studies    Component Value Date/Time   IRON 97 09/15/2023 0832   IRON 24 (L) 06/25/2022 1420   TIBC 403 09/15/2023 0832   TIBC 351  06/25/2022 1420   FERRITIN 169 09/15/2023 0833   FERRITIN 81 06/25/2022 1420   IRONPCTSAT 24 09/15/2023 0832   IRONPCTSAT 7 (LL) 06/25/2022 1420   Lipid Panel     Component Value Date/Time   CHOL 144 09/04/2022 1027   TRIG 52 09/04/2022 1027   HDL 70 09/04/2022 1027   CHOLHDL 2.7 05/02/2021 1158   CHOLHDL 2.9 04/02/2018 1115   VLDL 51 (H) 04/02/2018 1115   LDLCALC 63 09/04/2022 1027   Hepatic Function Panel     Component Value Date/Time   PROT 6.5 03/11/2023 0845   ALBUMIN 4.4 03/11/2023 0845   AST 16 03/11/2023 0845   ALT 26 03/11/2023 0845   ALKPHOS 100 03/11/2023 0845   BILITOT 0.3 03/11/2023 0845      Component Value Date/Time   TSH 0.976 11/21/2021 1007   Nutritional Lab Results  Component Value Date   VD25OH 78.3 03/11/2023   VD25OH 54.3 09/04/2022   VD25OH 34.2 11/21/2021     Medications: Outpatient Encounter Medications as of 12/03/2023  Medication Sig   Accu-Chek FastClix Lancets MISC use 1 strip to check blood sugar up to 7 times a week   atorvastatin (LIPITOR) 80 MG tablet Take 1 tablet (80 mg total) by mouth daily.   bacitracin-polymyxin b (POLYSPORIN) ophthalmic ointment Apply a small amount into the right eye 3 (three) times daily.   cycloSPORINE (RESTASIS) 0.05 % ophthalmic emulsion Place 1 drop into both eyes 2 (two) times daily.   doxepin (SINEQUAN) 10 MG capsule Take 1 capsule (10 mg total) by mouth at bedtime as needed for sleep   erythromycin ophthalmic ointment Apply a small amount into the right eye 3 (three) times daily.   ferrous sulfate 325 (65 FE) MG tablet Take 325 mg by mouth daily with breakfast.   fluorometholone (FML) 0.1 % ophthalmic suspension Place 1 drop into the right eye 4 (four) times daily.   fluticasone (FLONASE) 50 MCG/ACT nasal spray Place 1 spray into both nostrils daily as needed for allergies or rhinitis   Glycerin-Hypromellose-PEG 400 (DRY EYE RELIEF DROPS) 0.2-0.2-1 % SOLN Place 1 drop into both eyes in the morning and at bedtime.   lamoTRIgine (LAMICTAL) 100 MG tablet Take 1 tablet (100 mg total) by mouth daily.   linaclotide (LINZESS) 145 MCG CAPS capsule Take 1 capsule (145 mcg total) by mouth daily before breakfast. Please schedule a yearly follow up for further refills. Thank you   Multiple Vitamins-Minerals (MULTIVITAMIN WITH MINERALS) tablet Take 1 tablet by mouth daily.   ondansetron (ZOFRAN) 4 MG tablet Take 1 tablet (4 mg total) by mouth every 6 (six) hours as needed for nausea.   polyethylene glycol (MIRALAX / GLYCOLAX) 17 g packet Mix 1 packet (17 g total) in 8 oz clear liquid and drink by mouth daily as needed for mild constipation.   prazosin (MINIPRESS) 1 MG capsule Take 1 capsule (1 mg total) by mouth at bedtime.   triamcinolone cream (KENALOG) 0.1 % Apply 1 Application topically daily. Apply underneath breast  as needed   triamcinolone ointment (KENALOG) 0.5 % Apply 1 Application topically daily as needed. Apply to hands as needed.   [DISCONTINUED] Semaglutide, 2 MG/DOSE, 8 MG/3ML SOPN Inject 2 mg as directed once a week.   Semaglutide, 2 MG/DOSE, 8 MG/3ML SOPN Inject 2 mg as directed once a week.   No facility-administered encounter medications on file as of 12/03/2023.     Follow-Up   Return for For Weight Mangement with Dr. Rikki Spearing - 4-5  weeks .. She was informed of the importance of frequent follow up visits to maximize her success with intensive lifestyle modifications for her multiple health conditions.  Attestation Statement   Reviewed by clinician on day of visit: allergies, medications, problem list, medical history, surgical history, family history, social history, and previous encounter notes.     Worthy Rancher, MD

## 2023-12-03 NOTE — Telephone Encounter (Signed)
 Patient called in stating Dr. Rikki Spearing sent a script in for her Ozempic, however when she got to the pharmacy to pick up she was told the script was cancelled. Can someone call her to let her know what is going on.  [phone]

## 2023-12-04 ENCOUNTER — Other Ambulatory Visit (HOSPITAL_COMMUNITY): Payer: Self-pay

## 2023-12-04 ENCOUNTER — Encounter (HOSPITAL_COMMUNITY): Payer: Self-pay

## 2023-12-08 ENCOUNTER — Other Ambulatory Visit (HOSPITAL_COMMUNITY): Payer: Self-pay

## 2023-12-08 ENCOUNTER — Other Ambulatory Visit: Payer: Self-pay | Admitting: Nurse Practitioner

## 2023-12-08 DIAGNOSIS — D649 Anemia, unspecified: Secondary | ICD-10-CM

## 2023-12-08 NOTE — Progress Notes (Unsigned)
 Patient Care Team: Gwenevere Abbot, MD as PCP - General (Internal Medicine) Malachy Mood, MD as Attending Physician (Hematology and Oncology) [name], NP as Nurse Practitioner (Hematology and Oncology)  Clinic Day:  12/09/2023  Referring physician: Gwenevere Abbot, MD  ASSESSMENT & PLAN:   Assessment & Plan: Anemia She has no prior history of anemia until Ortho surgery in 09/2021, anemia resolved after surgery, then recurrent more severe anemia with second surgery in 10/2021 Hgb 7.9 -She started oral iron p.o. once daily 10/21/2021, tolerating well with mild constipation managed with OTC meds -Past GI work-up 12/2020 showed duodenitis and H. Pylori+ which was treated.  No obvious bleeding source on upper endoscopy or colonoscopy (Dr. Adela Lank) -She does not currently have menstrual periods -Recent folic acid, B12, thyroid panel, and iron panel were normal -Her mild anemia is likely related to orthopedic operations and is recovering.   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   -She received 1 dose IV Feraheme in 07/2022 and continues oral iron, tolerated well. IDA resolved with IV and oral iron -Stopped oral iron in 10/2022 -12/09/2023 - CBC and CMP within normal limits. Ferritin and iron panels are pending. Will treat with IV iron if ferritin level is < 100. Will be scheduled at [facility]. Patient is aware and agreeable to the plan.    Fatigue and cold sensation Possibly due to iron deficiency. CBC is normal today and waiting on ferritin and iron studies. Will treat with IV iron if ferritin has decreased significantly from last check.   Left shoulder pain Has soft tissue mass in upper left shoulder. Very tender. Hurts to lay on her left side. Causes shooting pain up and down her arm if area is pressed on. She has appointment with orthopedics today for further evaluation.    Abscess Has been seen in ED for abscess in crease of  buttocks. Was placed or oral antibiotics for 7 days. She has completed these. Continues to have drainage from the abscess. Advised she discuss this with PCP as she may need further treatment with antibiotics or I & D of abscess. She states she prefers to go back to the ED where she was seen for this initially. She is planning to do this in the near future.   Pl;an: Labs reviewed. -CBC and CMP stable and within normal limits  -ferritin and iron studies are pending.  --will arrange for IV iron supplementation if clinically indicated. Goal to keep ferritin > 100 -labs with follow up in 4 months. Will see her back sooner if new symptoms develop.   The patient understands the plans discussed today and is in agreement with them.  She knows to contact our office if she develops concerns prior to her next appointment.  I provided 20 minutes of face-to-face time during this encounter and > 50% was spent counseling as documented under my assessment and plan.    Carlean Jews, NP   [facility] - A DEPT OF [facility] [address] [place] [address] Dept: [phone] Dept Fax: [phone]   No orders of the defined types were placed in this encounter.     CHIEF COMPLAINT:  CC: iron deficient anemia  Current Treatment: IV Feraheme as needed  INTERVAL HISTORY:  Meghan Wilson is here today for repeat clinical assessment.  She was last seen on 09/15/2023.  Ferritin was 169.  Iron  was 97 with saturation ratio of 24%.  Hgb and HCT were normal. Today, she complains of often feeling cold. She states that she had the flu in late January 2025 and has been feeling like this every since. She also states that she was seen in the ED for a rash in the crease of her buttocks. She was treated with oral Keflex and completed antibiotics. She continues to have drainage from the lesion. Plans to return to the ED for further work up. She denies chest  pain, chest pressure, or shortness of breath. She denies headaches or visual disturbances. She denies abdominal pain, nausea, vomiting, or changes in bowel or bladder habits  Her appetite is good. Her weight has been stable.  I have reviewed the past medical history, past surgical history, social history and family history with the patient and they are unchanged from previous note.  ALLERGIES:  is allergic to aspirin, depakote [divalproex sodium], ibuprofen, prednisone, and latex.  MEDICATIONS:  Current Outpatient Medications  Medication Sig Dispense Refill   Accu-Chek FastClix Lancets MISC use 1 strip to check blood sugar up to 7 times a week 102 each 3   atorvastatin (LIPITOR) 80 MG tablet Take 1 tablet (80 mg total) by mouth daily. 30 tablet 11   bacitracin-polymyxin b (POLYSPORIN) ophthalmic ointment Apply a small amount into the right eye 3 (three) times daily. 3.5 g 0   cycloSPORINE (RESTASIS) 0.05 % ophthalmic emulsion Place 1 drop into both eyes 2 (two) times daily. 180 each 3   doxepin (SINEQUAN) 10 MG capsule Take 1 capsule (10 mg total) by mouth at bedtime as needed for sleep 30 capsule 3   erythromycin ophthalmic ointment Apply a small amount into the right eye 3 (three) times daily. 3.5 g 0   ferrous sulfate 325 (65 FE) MG tablet Take 325 mg by mouth daily with breakfast.     fluorometholone (FML) 0.1 % ophthalmic suspension Place 1 drop into the right eye 4 (four) times daily. 5 mL 0   fluticasone (FLONASE) 50 MCG/ACT nasal spray Place 1 spray into both nostrils daily as needed for allergies or rhinitis 16 g 2   Glycerin-Hypromellose-PEG 400 (DRY EYE RELIEF DROPS) 0.2-0.2-1 % SOLN Place 1 drop into both eyes in the morning and at bedtime.     lamoTRIgine (LAMICTAL) 100 MG tablet Take 1 tablet (100 mg total) by mouth daily. 30 tablet 3   linaclotide (LINZESS) 145 MCG CAPS capsule Take 1 capsule (145 mcg total) by mouth daily before breakfast. Please schedule a yearly follow up for  further refills. Thank you 30 capsule 1   Multiple Vitamins-Minerals (MULTIVITAMIN WITH MINERALS) tablet Take 1 tablet by mouth daily.     ondansetron (ZOFRAN) 4 MG tablet Take 1 tablet (4 mg total) by mouth every 6 (six) hours as needed for nausea. 20 tablet 0   polyethylene glycol (MIRALAX / GLYCOLAX) 17 g packet Mix 1 packet (17 g total) in 8 oz clear liquid and drink by mouth daily as needed for mild constipation. 14 each 0   prazosin (MINIPRESS) 1 MG capsule Take 1 capsule (1 mg total) by mouth at bedtime. 30 capsule 3   Semaglutide, 2 MG/DOSE, 8 MG/3ML SOPN Inject 2 mg as directed once a week. 3 mL 1   triamcinolone cream (KENALOG) 0.1 % Apply 1 Application topically daily. Apply underneath breast as needed 45 g 2   triamcinolone ointment (KENALOG) 0.5 % Apply 1 Application topically daily as needed. Apply to hands as  needed. 30 g 2   No current facility-administered medications for this visit.      REVIEW OF SYSTEMS:   Constitutional: Denies fevers, chills or abnormal weight loss Eyes: Denies blurriness of vision Ears, nose, mouth, throat, and face: Denies mucositis or sore throat Respiratory: Denies cough, dyspnea or wheezes Cardiovascular: Denies palpitation, chest discomfort or lower extremity swelling Gastrointestinal:  Denies nausea, heartburn or change in bowel habits Skin: Denies abnormal skin rashes Lymphatics: Denies new lymphadenopathy or easy bruising Neurological:Denies numbness, tingling or new weaknesses. Has had 3 episodes of dizziness over the past few months. These started after she fell and hit her head at home, tripping over her grandson's toy.  Behavioral/Psych: Mood is stable, no new changes  All other systems were reviewed with the patient and are negative.   VITALS:   Today's Vitals   12/09/23 0905  BP: 110/66  Pulse: 96  Resp: 17  Temp: 97.6 F (36.4 C)  TempSrc: Temporal  SpO2: 96%  Weight: 175 lb 3.2 oz (79.5 kg)  PainSc: 0-No pain   Body  mass index is 32.04 kg/m.   Wt Readings from Last 3 Encounters:  12/09/23 175 lb 3.2 oz (79.5 kg)  12/03/23 171 lb (77.6 kg)  11/22/23 174 lb (78.9 kg)    Body mass index is 32.04 kg/m.  Performance status (ECOG): 1 - Symptomatic but completely ambulatory  PHYSICAL EXAM:   GENERAL:alert, no distress and comfortable SKIN: skin color, texture, turgor are normal, no rashes or significant lesions EYES: normal, Conjunctiva are pink and non-injected, sclera clear OROPHARYNX:no exudate, no erythema and lips, buccal mucosa, and tongue normal  NECK: supple, thyroid normal size, non-tender, without nodularity LYMPH:  no palpable lymphadenopathy in the cervical, axillary or inguinal LUNGS: clear to auscultation and percussion with normal breathing effort HEART: regular rate & rhythm and no murmurs and no lower extremity edema ABDOMEN:abdomen soft, non-tender and normal bowel sounds Musculoskeletal:no cyanosis of digits and no clubbing  NEURO: alert & oriented x 3 with fluent speech, no focal motor/sensory deficits  LABORATORY DATA:  I have reviewed the data as listed    Component Value Date/Time   NA 141 12/09/2023 0842   NA 138 03/11/2023 0845   K 4.2 12/09/2023 0842   CL 104 12/09/2023 0842   CO2 30 12/09/2023 0842   GLUCOSE 93 12/09/2023 0842   BUN 9 12/09/2023 0842   BUN 9 03/11/2023 0845   CREATININE 0.76 12/09/2023 0842   CALCIUM 9.6 12/09/2023 0842   PROT 7.3 12/09/2023 0842   PROT 6.5 03/11/2023 0845   ALBUMIN 4.4 12/09/2023 0842   ALBUMIN 4.4 03/11/2023 0845   AST 15 12/09/2023 0842   ALT 17 12/09/2023 0842   ALKPHOS 73 12/09/2023 0842   BILITOT 0.4 12/09/2023 0842   GFRNONAA >60 12/09/2023 0842   GFRAA 119 05/11/2020 0935   Lab Results  Component Value Date   WBC 5.8 12/09/2023   NEUTROABS 3.1 12/09/2023   HGB 12.6 12/09/2023   HCT 40.3 12/09/2023   MCV 84.7 12/09/2023   PLT 345 12/09/2023    Iron/TIBC/Ferritin/ %Sat    Component Value Date/Time   IRON  75 12/09/2023 0842   IRON 24 (L) 06/25/2022 1420   TIBC 386 12/09/2023 0842   TIBC 351 06/25/2022 1420   FERRITIN 169 09/15/2023 0833   FERRITIN 81 06/25/2022 1420   IRONPCTSAT 19 12/09/2023 0842   IRONPCTSAT 7 (LL) 06/25/2022 1420

## 2023-12-08 NOTE — Assessment & Plan Note (Signed)
 She has no prior history of anemia until Ortho surgery in 09/2021, anemia resolved after surgery, then recurrent more severe anemia with second surgery in 10/2021 Hgb 7.9 -She started oral iron p.o. once daily 10/21/2021, tolerating well with mild constipation managed with OTC meds -Past GI work-up 12/2020 showed duodenitis and H. Pylori+ which was treated.  No obvious bleeding source on upper endoscopy or colonoscopy (Dr. Adela Lank) -She does not currently have menstrual periods -Recent folic acid, B12, thyroid panel, and iron panel were normal -Her mild anemia is likely related to orthopedic operations and is recovering.   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   -She received 1 dose IV Feraheme in 07/2022 and continues oral iron, tolerated well. IDA resolved with IV and oral iron -Stopped oral iron in 10/2022 -Ms. Heffley is clinically doing well. CBC and iron/TIBC are normal, ferritin also normmal. Likely can remain off oral iron

## 2023-12-09 ENCOUNTER — Inpatient Hospital Stay: Payer: MEDICAID | Attending: Nurse Practitioner

## 2023-12-09 ENCOUNTER — Inpatient Hospital Stay (HOSPITAL_BASED_OUTPATIENT_CLINIC_OR_DEPARTMENT_OTHER): Payer: MEDICAID | Admitting: Nurse Practitioner

## 2023-12-09 ENCOUNTER — Encounter: Payer: Self-pay | Admitting: Nurse Practitioner

## 2023-12-09 VITALS — BP 110/66 | HR 96 | Temp 97.6°F | Resp 17 | Wt 175.2 lb

## 2023-12-09 DIAGNOSIS — R5383 Other fatigue: Secondary | ICD-10-CM | POA: Insufficient documentation

## 2023-12-09 DIAGNOSIS — M25512 Pain in left shoulder: Secondary | ICD-10-CM | POA: Diagnosis not present

## 2023-12-09 DIAGNOSIS — D649 Anemia, unspecified: Secondary | ICD-10-CM | POA: Diagnosis present

## 2023-12-09 DIAGNOSIS — D509 Iron deficiency anemia, unspecified: Secondary | ICD-10-CM

## 2023-12-09 DIAGNOSIS — R2232 Localized swelling, mass and lump, left upper limb: Secondary | ICD-10-CM | POA: Diagnosis not present

## 2023-12-09 DIAGNOSIS — L0231 Cutaneous abscess of buttock: Secondary | ICD-10-CM | POA: Diagnosis not present

## 2023-12-09 LAB — CMP (CANCER CENTER ONLY)
ALT: 17 U/L (ref 0–44)
AST: 15 U/L (ref 15–41)
Albumin: 4.4 g/dL (ref 3.5–5.0)
Alkaline Phosphatase: 73 U/L (ref 38–126)
Anion gap: 7 (ref 5–15)
BUN: 9 mg/dL (ref 8–23)
CO2: 30 mmol/L (ref 22–32)
Calcium: 9.6 mg/dL (ref 8.9–10.3)
Chloride: 104 mmol/L (ref 98–111)
Creatinine: 0.76 mg/dL (ref 0.44–1.00)
GFR, Estimated: >60 mL/min (ref >60)
Glucose, Bld: 93 mg/dL (ref 70–99)
Potassium: 4.2 mmol/L (ref 3.5–5.1)
Sodium: 141 mmol/L (ref 135–145)
Total Bilirubin: 0.4 mg/dL (ref 0.0–1.2)
Total Protein: 7.3 g/dL (ref 6.5–8.1)

## 2023-12-09 LAB — CBC WITH DIFFERENTIAL (CANCER CENTER ONLY)
Abs Immature Granulocytes: 0.02 10*3/uL (ref 0.00–0.07)
Basophils Absolute: 0.1 10*3/uL (ref 0.0–0.1)
Basophils Relative: 1 %
Eosinophils Absolute: 0.1 10*3/uL (ref 0.0–0.5)
Eosinophils Relative: 1 %
HCT: 40.3 % (ref 36.0–46.0)
Hemoglobin: 12.6 g/dL (ref 12.0–15.0)
Immature Granulocytes: 0 %
Lymphocytes Relative: 35 %
Lymphs Abs: 2.1 10*3/uL (ref 0.7–4.0)
MCH: 26.5 pg (ref 26.0–34.0)
MCHC: 31.3 g/dL (ref 30.0–36.0)
MCV: 84.7 fL (ref 80.0–100.0)
Monocytes Absolute: 0.5 10*3/uL (ref 0.1–1.0)
Monocytes Relative: 9 %
Neutro Abs: 3.1 10*3/uL (ref 1.7–7.7)
Neutrophils Relative %: 54 %
Platelet Count: 345 10*3/uL (ref 150–400)
RBC: 4.76 MIL/uL (ref 3.87–5.11)
RDW: 14.8 % (ref 11.5–15.5)
WBC Count: 5.8 10*3/uL (ref 4.0–10.5)
nRBC: 0 % (ref 0.0–0.2)

## 2023-12-09 LAB — IRON AND IRON BINDING CAPACITY (CC-WL,HP ONLY)
Iron: 75 ug/dL (ref 28–170)
Saturation Ratios: 19 % (ref 10.4–31.8)
TIBC: 386 ug/dL (ref 250–450)
UIBC: 311 ug/dL (ref 148–442)

## 2023-12-09 LAB — FERRITIN: Ferritin: 166 ng/mL (ref 11–307)

## 2023-12-10 ENCOUNTER — Other Ambulatory Visit (HOSPITAL_COMMUNITY): Payer: Self-pay

## 2023-12-10 ENCOUNTER — Ambulatory Visit: Payer: MEDICAID | Admitting: Student

## 2023-12-10 VITALS — BP 123/79 | HR 89 | Temp 97.9°F | Ht 62.0 in | Wt 175.3 lb

## 2023-12-10 DIAGNOSIS — R059 Cough, unspecified: Secondary | ICD-10-CM | POA: Diagnosis not present

## 2023-12-10 DIAGNOSIS — W19XXXS Unspecified fall, sequela: Secondary | ICD-10-CM | POA: Diagnosis not present

## 2023-12-10 DIAGNOSIS — E785 Hyperlipidemia, unspecified: Secondary | ICD-10-CM

## 2023-12-10 MED ORDER — ATORVASTATIN CALCIUM 80 MG PO TABS
80.0000 mg | ORAL_TABLET | Freq: Every day | ORAL | 11 refills | Status: AC
  Filled 2023-12-10 – 2024-01-08 (×2): qty 30, 30d supply, fill #0
  Filled 2024-02-16: qty 30, 30d supply, fill #1
  Filled 2024-03-16: qty 30, 30d supply, fill #2
  Filled 2024-04-18: qty 30, 30d supply, fill #3
  Filled 2024-05-10: qty 30, 30d supply, fill #4
  Filled 2024-06-22: qty 30, 30d supply, fill #5
  Filled 2024-07-20: qty 30, 30d supply, fill #6
  Filled 2024-08-29: qty 30, 30d supply, fill #7

## 2023-12-10 MED ORDER — GUAIFENESIN ER 600 MG PO TB12
600.0000 mg | ORAL_TABLET | Freq: Two times a day (BID) | ORAL | 0 refills | Status: AC
  Filled 2023-12-10: qty 28, 14d supply, fill #0

## 2023-12-10 NOTE — Progress Notes (Unsigned)
 Established Patient Office Visit  Subjective   Patient ID: Meghan Wilson, female    DOB: [birth year]  Age: 63 y.o. MRN: [id number]  Chief Complaint  Patient presents with   Follow-up    HPI  [name] is a 63 y.o. female living with a history stated below and presents today for follow up knee pain after she had a fall. Please see problem based assessment and plan for additional details.   Past Medical History:  Diagnosis Date   Anemia    Anxiety    Arthritis    Back pain    Bipolar depression (HCC) 09/2015   Chronic knee pain after total replacement of right knee joint 10/02/2021   Constipation    Depression 06/27/2021   Dizziness    Generalized abdominal discomfort 05/06/2021   History of colon polyps    History of kidney stones    History of patellar fracture 2016   History of tibial fracture 2016   Hyperlipidemia    IBS (irritable bowel syndrome)    Joint pain    Night terrors    Night terrors, adult 09/2015   OSA (obstructive sleep apnea) 04/07/2022   Osteoarthritis    Pain in joint of right shoulder 09/16/2021   Pain of right knee after injury 03/10/2018   Post concussion syndrome 12/20/2019   Pre-diabetes    S/P hardware removal, right tibia 09/17/2021   S/P total knee arthroplasty, 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   Seasonal allergies 11/09/2020   Sleep apnea    uses cpap   Vaginal atrophy 11/09/2020   Vitamin B 12 deficiency    Vitamin D deficiency    ROS    As per Assessment and Plan  Objective:     BP 123/79 (BP Location: Left Arm, Patient Position: Sitting, Cuff Size: Small)   Pulse 89   Temp 97.9 F (36.6 C) (Oral)   Ht 5\' 2"  (1.575 m)   Wt 175 lb 4.8 oz (79.5 kg)   SpO2 100%   BMI 32.06 kg/m  BP Readings from Last 3 Encounters:  12/10/23 123/79  12/09/23 110/66  12/03/23 122/74   Wt Readings from Last 3 Encounters:  12/10/23 175 lb 4.8 oz (79.5 kg)  12/09/23 175 lb 3.2 oz (79.5 kg)  12/03/23 171  lb (77.6 kg)      Physical Exam  General" sitting in chair, no acute distress Cardiovascular: Regular rate, no murmurs appreciated Pulmonary: Breathing comfortably, no wheezing or crackles Abdomen: Soft, nontender, nondistended MSK: 5 out of 5 strength bilateral lower extremities, with flexion and extension at the knee and hips.  No pain appreciated.  No swelling of the left knee or erythema. +Left UE empty can test.  Passive range of motion is intact. +soft mass left medial deltoid, NT  No results found for any visits on 12/10/23.  Last CBC Lab Results  Component Value Date   WBC 5.8 12/09/2023   HGB 12.6 12/09/2023   HCT 40.3 12/09/2023   MCV 84.7 12/09/2023   MCH 26.5 12/09/2023   RDW 14.8 12/09/2023   PLT 345 12/09/2023   Last metabolic panel Lab Results  Component Value Date   GLUCOSE 93 12/09/2023   NA 141 12/09/2023   K 4.2 12/09/2023   CL 104 12/09/2023   CO2 30 12/09/2023   BUN 9 12/09/2023   CREATININE 0.76 12/09/2023   GFRNONAA >60 12/09/2023   CALCIUM 9.6 12/09/2023   PROT 7.3 12/09/2023   ALBUMIN 4.4  12/09/2023   LABGLOB 2.1 03/11/2023   AGRATIO 1.9 09/04/2022   BILITOT 0.4 12/09/2023   ALKPHOS 73 12/09/2023   AST 15 12/09/2023   ALT 17 12/09/2023   ANIONGAP 7 12/09/2023   Last lipids Lab Results  Component Value Date   CHOL 144 09/04/2022   HDL 70 09/04/2022   LDLCALC 63 09/04/2022   TRIG 52 09/04/2022   CHOLHDL 2.7 05/02/2021   Last hemoglobin A1c Lab Results  Component Value Date   HGBA1C 5.5 09/10/2023      The 10-year ASCVD risk score (Arnett DK, et al., 2019) is: 11.6%    Assessment & Plan:  Patient is discussed with Dr. Lafonda Mosses   Problem List Items Addressed This Visit       Other   Hyperlipidemia   Relevant Medications   atorvastatin (LIPITOR) 80 MG tablet   Fall - Primary   This is a pleasant 63 year old female who presents here to follow-up for her last office visit on 10/28/2023, came in for a fall that she had in  early February.  Patient reports that she was walking outside and slipped, reports it was no way, fell on her left side.  She was evaluated in the ED on 2/24 where she had x-ray of her bilateral knees that were unremarkable, x-ray of left shoulder that was unremarkable, x-ray of abdomen pelvis that was unremarkable.  Patient reports that she followed up with the orthopedics, she saw them yesterday who gave her a cortisone shot left shoulder which has immensely improved the range of motion and pain.  Reports that she has this soft tissue mass in her medial portion of the left shoulder this has been present for several months, reports that she was seen by the orthopedics who said that it is not concerning, and they will follow-up.  She denies any left knee pain, reports that the pain tenderness and range of motion has all resolved.  Per physical exam she has 5 out of 5 strength in active/passive range of motion that is intact of her left lower extremity.  There is some notable restriction in range of motion that is noted with her left shoulder.  -Continue to follow-up with the orthopedics and physical therapy      Cough in adult   Patient reports that she has this lingering cough since she had flu back in February, denies any fevers, chills, chest pain, shortness of breath.  Reports that she has some nasal congestion and feels like she has chest congestion, reports that makes her feel like she wants to cough up the phlegm.  Reports that it is bothering her and she wanted some medication for it.  Says her cough is still within less than 3 months, patient is advised to take over-the-counter decongestions and cough tablets.  If her cough continues to bother her, greater than 3 months, we can consider further workup for GERD/allergy/bronchitis.  -Mucinex 600 mg twice daily for 14 days -If symptoms do not improve, will consider further workup for GERD, allergies, bronchitis, postnasal drip       Return in  about 1 month (around 01/09/2024) for Already has appointment scheduled .    Jeral Pinch, DO

## 2023-12-10 NOTE — Patient Instructions (Signed)
 Thank you, [name] for allowing [place] to provide your care today. Today we discussed:  Cough - Take Mucinex 600 mg two times a day for 2 weeks - If your symptoms do not improve, we will discuss other options at your next visit  Continue going to physical therapy and Orthopedics for your shoulder.   I have ordered the following labs for you:  Lab Orders  No laboratory test(s) ordered today     Tests ordered today:  None   Referrals ordered today:   Referral Orders  No referral(s) requested today     I have ordered the following medication/changed the following medications:   Stop the following medications: Medications Discontinued During This Encounter  Medication Reason   atorvastatin (LIPITOR) 80 MG tablet Reorder     Start the following medications: Meds ordered this encounter  Medications   atorvastatin (LIPITOR) 80 MG tablet    Sig: Take 1 tablet (80 mg total) by mouth daily.    Dispense:  30 tablet    Refill:  11   guaiFENesin (MUCINEX) 600 MG 12 hr tablet    Sig: Take 1 tablet (600 mg total) by mouth 2 (two) times daily for 14 days.    Dispense:  28 tablet    Refill:  0     Follow up:  Already has appointment      Remember:   Should you have any questions or concerns please call the internal medicine clinic at [phone].     Jeral Pinch, DO [facility]

## 2023-12-11 DIAGNOSIS — R059 Cough, unspecified: Secondary | ICD-10-CM | POA: Insufficient documentation

## 2023-12-11 NOTE — Assessment & Plan Note (Signed)
 This is a pleasant 63 year old female who presents here to follow-up for her last office visit on 10/28/2023, came in for a fall that she had in early February.  Patient reports that she was walking outside and slipped, reports it was no way, fell on her left side.  She was evaluated in the ED on 2/24 where she had x-ray of her bilateral knees that were unremarkable, x-ray of left shoulder that was unremarkable, x-ray of abdomen pelvis that was unremarkable.  Patient reports that she followed up with the orthopedics, she saw them yesterday who gave her a cortisone shot left shoulder which has immensely improved the range of motion and pain.  Reports that she has this soft tissue mass in her medial portion of the left shoulder this has been present for several months, reports that she was seen by the orthopedics who said that it is not concerning, and they will follow-up.  She denies any left knee pain, reports that the pain tenderness and range of motion has all resolved.  Per physical exam she has 5 out of 5 strength in active/passive range of motion that is intact of her left lower extremity.  There is some notable restriction in range of motion that is noted with her left shoulder.  -Continue to follow-up with the orthopedics and physical therapy

## 2023-12-11 NOTE — Assessment & Plan Note (Signed)
 Patient reports that she has this lingering cough since she had flu back in February, denies any fevers, chills, chest pain, shortness of breath.  Reports that she has some nasal congestion and feels like she has chest congestion, reports that makes her feel like she wants to cough up the phlegm.  Reports that it is bothering her and she wanted some medication for it.  Says her cough is still within less than 3 months, patient is advised to take over-the-counter decongestions and cough tablets.  If her cough continues to bother her, greater than 3 months, we can consider further workup for GERD/allergy/bronchitis.  -Mucinex 600 mg twice daily for 14 days -If symptoms do not improve, will consider further workup for GERD, allergies, bronchitis, postnasal drip

## 2023-12-14 NOTE — Progress Notes (Signed)
 Internal Medicine Clinic Attending  Case discussed with the resident at the time of the visit.  We reviewed the resident's history and exam and pertinent patient test results.  I agree with the assessment, diagnosis, and plan of care documented in the resident's note.

## 2023-12-17 ENCOUNTER — Other Ambulatory Visit (HOSPITAL_COMMUNITY): Payer: Self-pay

## 2023-12-17 ENCOUNTER — Other Ambulatory Visit: Payer: Self-pay

## 2023-12-17 MED ORDER — LAMOTRIGINE 100 MG PO TABS
100.0000 mg | ORAL_TABLET | Freq: Every day | ORAL | 2 refills | Status: DC
  Filled 2023-12-17: qty 30, 30d supply, fill #0

## 2023-12-17 MED ORDER — DOXEPIN HCL 10 MG PO CAPS
10.0000 mg | ORAL_CAPSULE | Freq: Every day | ORAL | 2 refills | Status: DC
  Filled 2023-12-17: qty 30, 30d supply, fill #0

## 2023-12-17 MED ORDER — PRAZOSIN HCL 1 MG PO CAPS
1.0000 mg | ORAL_CAPSULE | Freq: Every day | ORAL | 2 refills | Status: DC
  Filled 2023-12-17: qty 30, 30d supply, fill #0
  Filled 2024-01-21: qty 30, 30d supply, fill #1

## 2024-01-01 ENCOUNTER — Other Ambulatory Visit (HOSPITAL_COMMUNITY): Payer: Self-pay

## 2024-01-08 ENCOUNTER — Other Ambulatory Visit: Payer: Self-pay

## 2024-01-08 ENCOUNTER — Other Ambulatory Visit (HOSPITAL_COMMUNITY): Payer: Self-pay

## 2024-01-11 ENCOUNTER — Ambulatory Visit: Payer: MEDICAID | Admitting: Student

## 2024-01-11 VITALS — BP 128/80 | HR 90 | Temp 97.3°F | Ht 62.0 in | Wt 176.1 lb

## 2024-01-11 DIAGNOSIS — Z6832 Body mass index (BMI) 32.0-32.9, adult: Secondary | ICD-10-CM | POA: Diagnosis not present

## 2024-01-11 DIAGNOSIS — G629 Polyneuropathy, unspecified: Secondary | ICD-10-CM

## 2024-01-11 DIAGNOSIS — E1169 Type 2 diabetes mellitus with other specified complication: Secondary | ICD-10-CM | POA: Diagnosis not present

## 2024-01-11 DIAGNOSIS — E119 Type 2 diabetes mellitus without complications: Secondary | ICD-10-CM

## 2024-01-11 DIAGNOSIS — Z7985 Long-term (current) use of injectable non-insulin antidiabetic drugs: Secondary | ICD-10-CM

## 2024-01-11 DIAGNOSIS — E669 Obesity, unspecified: Secondary | ICD-10-CM | POA: Diagnosis not present

## 2024-01-11 DIAGNOSIS — R059 Cough, unspecified: Secondary | ICD-10-CM

## 2024-01-11 LAB — POCT GLYCOSYLATED HEMOGLOBIN (HGB A1C): Hemoglobin A1C: 5.7 % — AB (ref 4.0–5.6)

## 2024-01-11 LAB — GLUCOSE, CAPILLARY: Glucose-Capillary: 72 mg/dL (ref 70–99)

## 2024-01-11 NOTE — Progress Notes (Signed)
 Patient name: [name] Date of birth: [birth year] Date of visit: 01/11/24  Subjective  Chief Complaint  Patient presents with   Diabetes   leg burning    HPI Meghan Wilson is here for follow-up.  She has leg burning. Both legs, ankles on down. Started 2-3 weeks. It's intermittent. She notices it more when she gets up or when she's on her legs for a while.  It is not worsening.  She has had surgery on both legs, knee surgery on right, hip replacement on left.  Review of Systems  Constitutional:  Negative for malaise/fatigue and weight loss.  HENT:         Runny nose, sneezing, dry throat, some post-nasal drip  Respiratory:  Positive for cough (chronic now, ongoing since flu season). Negative for sputum production, shortness of breath and wheezing.   Gastrointestinal:  Negative for abdominal pain, constipation, diarrhea, heartburn and vomiting.  Genitourinary:  Negative for frequency.  Musculoskeletal:  Negative for falls.  Psychiatric/Behavioral:  Negative for depression. The patient does not have insomnia.     Patient Active Problem List   Diagnosis Date Noted   Neuropathy 01/11/2024   Cough in adult 12/11/2023   Fall 10/28/2023   Influenza A 10/14/2023   Obesity: Initial BMI 35 02/02/2023   BMI 31.0-31.9,adult Current BMI 31.4 02/02/2023   OSA on CPAP 10/16/2022   Vitamin D  deficiency 07/28/2022   Bilateral tinnitus 06/26/2022   Anemia 04/07/2022   Pain of left hip joint 02/17/2022   Shortened PR interval 11/21/2021   Aortic atherosclerosis (HCC) 07/08/2021   Depression 06/27/2021   Obesity with current BMI of 31 06/27/2021   Rash 05/11/2020   Type 2 diabetes mellitus with obesity (HCC) 02/16/2019   Healthcare maintenance 01/02/2018   Hyperlipidemia 01/01/2018   Peptic gastritis 01/01/2018   Bipolar disorder (HCC) 08/03/2015   Past Medical History:  Diagnosis Date   Anemia    Anxiety    Arthritis    Back pain    Bipolar depression (HCC) 09/2015   Chronic  knee pain after total replacement of right knee joint 10/02/2021   Constipation    Depression 06/27/2021   Dizziness    Generalized abdominal discomfort 05/06/2021   History of colon polyps    History of kidney stones    History of patellar fracture 2016   History of tibial fracture 2016   Hyperlipidemia    IBS (irritable bowel syndrome)    Joint pain    Night terrors    Night terrors, adult 09/2015   OSA (obstructive sleep apnea) 04/07/2022   Osteoarthritis    Pain in joint of right shoulder 09/16/2021   Pain of right knee after injury 03/10/2018   Post concussion syndrome 12/20/2019   Pre-diabetes    S/P hardware removal, right tibia 09/17/2021   S/P total knee arthroplasty, 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   bacitracin -polymyxin b  (POLYSPORIN ) ophthalmic ointment Apply a small amount into the right eye 3 (three) times daily.   cycloSPORINE  (RESTASIS ) 0.05 % ophthalmic  emulsion Place 1 drop into both eyes 2 (two) times daily.   doxepin  (SINEQUAN ) 10 MG capsule Take 1 capsule (10 mg total) by mouth at bedtime as needed for sleep   doxepin  (SINEQUAN ) 10 MG capsule Take 1 capsule (10 mg total) by mouth at bedtime.   erythromycin  ophthalmic ointment Apply a small amount into the right eye 3 (three) times daily.   ferrous sulfate  325 (65 FE) MG tablet Take 325 mg by mouth daily with breakfast.   fluorometholone  (FML) 0.1 % ophthalmic suspension Place 1 drop into the right eye 4 (four) times daily.   fluticasone  (FLONASE ) 50 MCG/ACT nasal spray Place 1 spray into both nostrils daily  as needed for allergies or rhinitis   Glycerin -Hypromellose-PEG 400 (DRY EYE RELIEF DROPS) 0.2-0.2-1 % SOLN Place 1 drop into both eyes in the morning and at bedtime.   lamoTRIgine  (LAMICTAL ) 100 MG tablet Take 1 tablet (100 mg total) by mouth daily.   lamoTRIgine  (LAMICTAL ) 100 MG tablet Take 1 tablet by mouth once a day   linaclotide  (LINZESS ) 145 MCG CAPS capsule Take 1 capsule (145 mcg total) by mouth daily before breakfast. Please schedule a yearly follow up for further refills. Thank you   Multiple Vitamins-Minerals (MULTIVITAMIN WITH MINERALS) tablet Take 1 tablet by mouth daily.   ondansetron  (ZOFRAN ) 4 MG tablet Take 1 tablet (4 mg total) by mouth every 6 (six) hours as needed for nausea.   polyethylene glycol (MIRALAX  / GLYCOLAX ) 17 g packet Mix 1 packet (17 g total) in 8 oz clear liquid and drink by mouth daily as needed for mild constipation.   prazosin  (MINIPRESS ) 1 MG capsule Take 1 capsule by mouth at bedtime   Semaglutide , 2 MG/DOSE, 8 MG/3ML SOPN Inject 2 mg as directed once a week.   triamcinolone  cream (KENALOG ) 0.1 % Apply 1 Application topically daily. Apply underneath breast as needed   triamcinolone  ointment (KENALOG ) 0.5 % Apply 1 Application topically daily as needed. Apply to hands as needed.   [DISCONTINUED] prazosin  (MINIPRESS ) 1 MG capsule Take 1 capsule (1 mg total) by mouth at bedtime.   No facility-administered encounter medications on file as of 01/11/2024.   Past Surgical History:  Procedure Laterality Date   ABDOMINAL HYSTERECTOMY     CHOLECYSTECTOMY     CLOSED REDUCTION TIBIAL FRACTURE Right 2003   COLONOSCOPY  2016   [facility]    HARDWARE REMOVAL Right 09/17/2021   Procedure: Removal of hardware from right tibia;  Surgeon: Claiborne Crew, MD;  Location: [facility];  Service: Orthopedics;  Laterality: Right;   MANDIBLE SURGERY  2022   TOTAL HIP ARTHROPLASTY Left 05/13/2022   Procedure: TOTAL HIP ARTHROPLASTY ANTERIOR APPROACH;  Surgeon: Claiborne Crew, MD;   Location: [facility];  Service: Orthopedics;  Laterality: Left;   TOTAL KNEE ARTHROPLASTY Right 10/17/2021   Procedure: TOTAL KNEE ARTHROPLASTY;  Surgeon: Claiborne Crew, MD;  Location: [facility];  Service: Orthopedics;  Laterality: Right;   WISDOM TOOTH EXTRACTION     Family History  Problem Relation Age of Onset   Hyperlipidemia Mother    Hypertension Mother    Colon polyps Mother    Heart disease Mother    Diabetes Mother    Depression Mother    Anxiety disorder Mother    Bipolar disorder Mother    Eating disorder Mother    Obesity Mother    Arthritis/Rheumatoid Father    Hyperlipidemia Maternal Aunt    Hypertension Maternal Aunt    Cancer Maternal Aunt        "  blood cancer"   Colon polyps Maternal Grandmother    Stroke Other    Colon cancer Neg Hx    Esophageal cancer Neg Hx    Stomach cancer Neg Hx    Rectal cancer Neg Hx    Sleep apnea Neg Hx    Breast cancer Neg Hx    Social History   Socioeconomic History   Marital status: Single    Spouse name: Not on file   Number of children: 2   Years of education: 2 years of college   Highest education level: Not on file  Occupational History   Not on file  Tobacco Use   Smoking status: Former    Current packs/day: 0.00    Average packs/day: 0.3 packs/day for 7.0 years (1.8 ttl pk-yrs)    Types: Cigarettes    Start date: 12/22/2010    Quit date: 12/21/2017    Years since quitting: 6.0   Smokeless tobacco: Never  Vaping Use   Vaping status: Never Used  Substance and Sexual Activity   Alcohol  use: Yes    Comment: special occasions   Drug use: No   Sexual activity: Yes    Birth control/protection: Surgical    Comment: Celibate  Other Topics Concern   Not on file  Social History Narrative   Lives at home with her fiance.   Right-handed.   No daily use of caffeine.   Social Drivers of Corporate investment banker Strain: Not on file  Food Insecurity: No Food Insecurity (09/25/2022)   Hunger Vital Sign    Worried About  Running Out of Food in the Last Year: Never true    Ran Out of Food in the Last Year: Never true  Transportation Needs: No Transportation Needs (05/14/2022)   PRAPARE - Administrator, Civil Service (Medical): No    Lack of Transportation (Non-Medical): No  Physical Activity: Not on file  Stress: Not on file  Social Connections: Moderately Isolated (09/25/2022)   Social Connection and Isolation Panel [NHANES]    Frequency of Communication with Friends and Family: More than three times a week    Frequency of Social Gatherings with Friends and Family: More than three times a week    Attends Religious Services: More than 4 times per year    Active Member of [employer] or Organizations: Not on file    Attends Banker Meetings: Never    Marital Status: Divorced  Catering manager Violence: Not At Risk (09/25/2022)   Humiliation, Afraid, Rape, and Kick questionnaire    Fear of Current or Ex-Partner: No    Emotionally Abused: No    Physically Abused: No    Sexually Abused: No     Objective  Today's Vitals   01/11/24 1002  BP: 128/80  Pulse: 90  Temp: (!) 97.3 F (36.3 C)  TempSrc: Oral  SpO2: 100%  Weight: 176 lb 1.6 oz (79.9 kg)  Height: 5\' 2"  (1.575 m)  PainSc: 0-No pain  Body mass index is 32.21 kg/m.   Physical Exam Constitutional:      General: She is not in acute distress.    Appearance: Normal appearance.  HENT:     Mouth/Throat:     Mouth: Mucous membranes are moist.  Eyes:     Extraocular Movements: Extraocular movements intact.     Conjunctiva/sclera: Conjunctivae normal.     Pupils: Pupils are equal, round, and reactive to light.  Neck:     Thyroid: No thyroid mass,  thyromegaly or thyroid tenderness.     Vascular: No carotid bruit.  Cardiovascular:     Rate and Rhythm: Normal rate and regular rhythm.     Pulses: Normal pulses.     Heart sounds: No murmur heard. Pulmonary:     Effort: Pulmonary effort is normal.     Breath sounds: Normal  breath sounds. No wheezing or rales.  Abdominal:     Palpations: Abdomen is soft. There is no hepatomegaly, splenomegaly or mass.     Tenderness: There is no abdominal tenderness.  Musculoskeletal:     Right lower leg: No edema.     Left lower leg: No edema.  Lymphadenopathy:     Cervical: No cervical adenopathy.  Skin:    General: Skin is warm and dry.  Neurological:     Mental Status: She is alert. Mental status is at baseline.     Cranial Nerves: No facial asymmetry.     Motor: No tremor.     Deep Tendon Reflexes: Reflexes normal.     Comments: DTRs checked at biceps, patella, achilles bilaterally  Psychiatric:        Mood and Affect: Mood normal.        Behavior: Behavior normal.      Assessment & Plan  Problem List Items Addressed This Visit     Cough in adult   Suspect that she had fluid that cause lingering postviral cough into allergy season and now she has postnasal drip and allergic rhinitis with associated cough.  No systemic or alarm symptoms.  It is improving, but she still coughs up some clear phlegm at times.  I recommended a trial of daily intranasal corticosteroids.  If her cough continues to improve, fine -- otherwise she should have chest radiograph.      Neuropathy   What she is describing sounds like peripheral neuropathy.  It is mild, and she does not have any objective findings to support this.  Exam without decreased sensation monofilament testing or hyperreflexia.  Could be due to diabetes although this is well-controlled and has been for years.  Do not think we need an SPEP right now, last CMP at the end of April showed no hypoproteinemia.  Last CBC showed no anemia.  Will check a B12 and TSH.        Relevant Orders   TSH   Vitamin B12   Type 2 diabetes mellitus with obesity (HCC) - Primary (Chronic)   Well-controlled.  A1c today is 5.7.  Glucose is 72.  She is on semaglutide  2 mg weekly.  No changes.      Relevant Orders   POC Hbg A1C (Completed)    Glucose, capillary (Completed)   Return in about 6 months (around 07/13/2024) for routine follow-up.  Adria Hopkins MD 01/11/2024, 1:22 PM

## 2024-01-11 NOTE — Assessment & Plan Note (Addendum)
 What she is describing sounds like peripheral neuropathy.  It is mild, and she does not have any objective findings to support this.  Exam without decreased sensation monofilament testing or hyperreflexia.  Could be due to diabetes although this is well-controlled and has been for years.  Do not think we need an SPEP right now, last CMP at the end of April showed no hypoproteinemia.  Last CBC showed no anemia.  Will check a B12 and TSH.

## 2024-01-11 NOTE — Assessment & Plan Note (Addendum)
 Well-controlled.  A1c today is 5.7.  Glucose is 72.  She is on semaglutide  2 mg weekly.  No changes.

## 2024-01-11 NOTE — Assessment & Plan Note (Addendum)
 Suspect that she had fluid that cause lingering postviral cough into allergy season and now she has postnasal drip and allergic rhinitis with associated cough.  No systemic or alarm symptoms.  It is improving, but she still coughs up some clear phlegm at times.  I recommended a trial of daily intranasal corticosteroids.  If her cough continues to improve, fine -- otherwise she should have chest radiograph.

## 2024-01-11 NOTE — Patient Instructions (Addendum)
 I'll check some labs for causes of neuropathy. Call if it gets worse.  Start using Flonase  (or generic version) daily. Try this for 2-4 weeks and see if your cough improves. If it doesn't, call me and we'll get a chest x-ray.  Call the clinic after July 1st to schedule a 38-month follow-up with Dr. Abner Hoffman.  Remember to bring all of the medications that you take (including over the counter medications and supplements) with you to every clinic visit.  This after visit summary is an important review of tests, referrals, and medication changes that were discussed during your visit. If you have questions or concerns, call [phone]. Outside of clinic business hours, call the main hospital at [phone] and ask the operator for the on-call internal medicine resident.   Adria Hopkins MD 01/11/2024, 10:45 AM

## 2024-01-12 NOTE — Progress Notes (Signed)
 Internal Medicine Clinic Attending  Case discussed with the resident at the time of the visit.  We reviewed the resident's history and exam and pertinent patient test results.  I agree with the assessment, diagnosis, and plan of care documented in the resident's note.

## 2024-01-13 ENCOUNTER — Ambulatory Visit: Payer: Self-pay | Admitting: Student

## 2024-01-13 LAB — TSH: TSH: 0.65 u[IU]/mL (ref 0.450–4.500)

## 2024-01-13 LAB — VITAMIN B12: Vitamin B-12: 571 pg/mL (ref 232–1245)

## 2024-01-21 ENCOUNTER — Ambulatory Visit: Payer: Self-pay | Admitting: *Deleted

## 2024-01-21 ENCOUNTER — Other Ambulatory Visit (HOSPITAL_COMMUNITY): Payer: Self-pay

## 2024-01-21 ENCOUNTER — Other Ambulatory Visit: Payer: Self-pay

## 2024-01-21 NOTE — Telephone Encounter (Signed)
 I called the patient to schedule a telehealth appointment. Unable to reach the patient, I lvm for her to give us  a call back.

## 2024-01-21 NOTE — Telephone Encounter (Signed)
 I called the patient to follow up on this message. Patient stated she is having a burning sensation in her legs. Patient stated she seen Dr.McLendon and he told her if she starts to have a burning sensation to let him know. The patient specifically wants Dr.McLendon to give her a call.

## 2024-01-21 NOTE — Telephone Encounter (Signed)
   Chief Complaint: Patient is calling to report she is still having burning sensation in her legs- she has it in her thighs today Symptoms: Patient states she has had burning sensation in her legs since her surgery to the legs- she states she does not know the cause- but has discussed the symptoms with PCP- was advised of it continued to notify provider. Patient states she woke this morning and has bilateral burning in the thighs.  Frequency: chronic- tends to move in different areas of the legs Pertinent Negatives: Patient denies any other symptoms Disposition: [] ED /[] Urgent Care (no appt availability in office) / [] Appointment(In office/virtual)/ []  [facility]/ [] Home Care/ [] Refused Recommended Disposition /[] [facility]/ [x]  Follow-up with PCP Additional Notes: Patient only wants follow up with Dr Abner Hoffman since he told her to notify him- patient would like to see him or get next steps- please call her back  Copied from CRM [id number]. Topic: Clinical - Red Word Triage >> Jan 21, 2024  9:23 AM Meghan Wilson: Red Word that prompted transfer to Nurse Triage:   Burning sensation in leg; Come and goes but she noticed yesterday there was burning sensation in her thighs (she was informed it may be a side effect of her metformin ) Reason for Disposition  [1] Numbness or tingling on both sides of body AND [2] is a new symptom present > 24 hours  Answer Assessment - Initial Assessment Questions 1. SYMPTOM: "What is the main symptom you are concerned about?" (e.g., weakness, numbness)     Burning sensation in thighs(both legs- R leg at ankle- goes into the calve, left and right thigh today)feels very uncomfortable 2. ONSET: "When did this start?" (minutes, hours, days; while sleeping)     Today- comes and goes 3. LAST NORMAL: "When was the last time you (the patient) were normal (no symptoms)?"     Patient is trying to figure out if it is medication- or food related 4.  PATTERN "Does this come and go, or has it been constant since it started?"  "Is it present now?"     Comes and goes 5. CARDIAC SYMPTOMS: "Have you had any of the following symptoms: chest pain, difficulty breathing, palpitations?"     no 6. NEUROLOGIC SYMPTOMS: "Have you had any of the following symptoms: headache, dizziness, vision loss, double vision, changes in speech, unsteady on your feet?"     no 7. OTHER SYMPTOMS: "Do you have any other symptoms?"     No other symptoms  Protocols used: Neurologic Deficit-A-AH

## 2024-01-21 NOTE — Telephone Encounter (Signed)
 Dr.McLendon's next available appointment wont' be until 6/16. I can offer her a telehealth appointment with another provider.

## 2024-01-22 ENCOUNTER — Other Ambulatory Visit (HOSPITAL_COMMUNITY): Payer: Self-pay

## 2024-01-26 ENCOUNTER — Ambulatory Visit (INDEPENDENT_AMBULATORY_CARE_PROVIDER_SITE_OTHER): Payer: MEDICAID | Admitting: Internal Medicine

## 2024-01-26 ENCOUNTER — Other Ambulatory Visit (HOSPITAL_COMMUNITY): Payer: Self-pay

## 2024-01-26 ENCOUNTER — Encounter (INDEPENDENT_AMBULATORY_CARE_PROVIDER_SITE_OTHER): Payer: Self-pay | Admitting: Internal Medicine

## 2024-01-26 VITALS — BP 119/76 | HR 95 | Temp 98.7°F | Ht 62.0 in | Wt 170.0 lb

## 2024-01-26 DIAGNOSIS — E1169 Type 2 diabetes mellitus with other specified complication: Secondary | ICD-10-CM | POA: Diagnosis not present

## 2024-01-26 DIAGNOSIS — G4733 Obstructive sleep apnea (adult) (pediatric): Secondary | ICD-10-CM | POA: Diagnosis not present

## 2024-01-26 DIAGNOSIS — E66812 Obesity, class 2: Secondary | ICD-10-CM | POA: Diagnosis not present

## 2024-01-26 DIAGNOSIS — Z6835 Body mass index (BMI) 35.0-35.9, adult: Secondary | ICD-10-CM | POA: Diagnosis not present

## 2024-01-26 DIAGNOSIS — Z7985 Long-term (current) use of injectable non-insulin antidiabetic drugs: Secondary | ICD-10-CM

## 2024-01-26 MED ORDER — SEMAGLUTIDE (2 MG/DOSE) 8 MG/3ML ~~LOC~~ SOPN
2.0000 mg | PEN_INJECTOR | SUBCUTANEOUS | 1 refills | Status: DC
  Filled 2024-01-26: qty 3, 28d supply, fill #0
  Filled 2024-02-24: qty 3, 28d supply, fill #1

## 2024-01-26 NOTE — Progress Notes (Signed)
 Office: [phone]  /  Fax: [phone]  Weight Summary And Biometrics  Vitals Temp: 98.7 F (37.1 C) BP: 119/76 Pulse Rate: 95 SpO2: 100 %   Anthropometric Measurements Height: 5\' 2"  (1.575 m) Weight: 170 lb (77.1 kg) BMI (Calculated): 31.09 Weight at Last Visit: 171lb Weight Lost Since Last Visit: 1lb Weight Gained Since Last Visit: 0lb Starting Weight: 195lb Total Weight Loss (lbs): 25 lb (11.3 kg) Peak Weight: 207lb   Body Composition  Body Fat %: 42.8 % Fat Mass (lbs): 73 lbs Muscle Mass (lbs): 92.6 lbs Total Body Water  (lbs): 60.2 lbs Visceral Fat Rating : 11    No data recorded Today's Visit #: 32  Starting Date: 11/21/21   Subjective   Chief Complaint: Obesity  Interval History  Discussed the use of AI scribe software for clinical note transcription with the patient, who gave verbal consent to proceed.  History of Present Illness   Meghan Wilson is a 63 year old female with type 2 diabetes and sleep apnea who presents for medical weight management.  She is currently on semaglutide  2 mg once a week for diabetes and weight management. Since her last visit, she has lost one pound, totaling a weight loss of 37 pounds from her peak weight of 207 pounds. Her body composition has improved with a decrease in body fat percentage from 44% to 42% and a reduction in visceral fat. Muscle mass has increased Despite a recent plateau in weight loss, she continues to follow a category two meal plan 70-80% of the time, tracks calories, consumes more whole foods, maintains adequate hydration, and occasionally skips meals. Her diet includes grilled and seafood, chicken, salmon, and a variety of fruits and vegetables. She exercises three days a week for about 60 minutes, including walking, yoga, and stretching.  Her blood work in April showed normal kidney function, liver enzymes, LDL cholesterol, and B12 levels. Her A1c improved to 5.7 from 6.4 last year, indicating good  glycemic control.   She reports reduced physical activity due to shoulder pain and a cyst, which required her to stop weightlifting and return to physical therapy. She has resumed walking and biking. She is scheduled to see her physical therapist next week for shoulder evaluation and potential release to resume weightlifting.      Challenges affecting patient progress: exposure to enticing environments and/or relationships and medical comorbidities.    Pharmacotherapy for weight management: She is currently taking Ozempic  with diabetes as the primary indication with adequate clinical response  and without side effects..   Assessment and Plan   Treatment Plan For Obesity:  Recommended Dietary Goals  Meghan Wilson is currently in the action stage of change. As such, her goal is to continue weight management plan. She has agreed to: follow the Category 1 plan - 1000 kcal per day  Behavioral Health and Counseling  We discussed the following behavioral modification strategies today: work on tracking and journaling calories using tracking application and continue to work on maintaining a reduced calorie state, getting the recommended amount of protein, incorporating whole foods, making healthy choices, staying well hydrated and practicing mindfulness when eating..  Additional education and resources provided today: New plan created and provided   Recommended Physical Activity Goals  Meghan Wilson has been advised to work up to 150 minutes of moderate intensity aerobic activity a week and strengthening exercises 2-3 times per week for cardiovascular health, weight loss maintenance and preservation of muscle mass.   She has agreed to :  continue  to gradually increase the amount and intensity of exercise routine  Pharmacotherapy  We discussed various medication options to help Meghan Wilson with her weight loss efforts and we both agreed to : adequate clinical response to anti-obesity medication, continue  current regimen  Associated Conditions Impacted by Obesity Treatment  OSA on CPAP  Type 2 diabetes mellitus with obesity (HCC) -     Semaglutide  (2 MG/DOSE); Inject 2 mg into the skin once a week.  Dispense: 3 mL; Refill: 1  Class 2 severe obesity with serious comorbidity and body mass index (BMI) of 35.0 to 35.9 in adult, unspecified obesity type (HCC)     Assessment and Plan    Type 2 diabetes mellitus Type 2 diabetes is well-managed with semaglutide  2 mg weekly. Hemoglobin A1c improved to 5.7% from 6.4% last year, indicating good glycemic control. She reports occasional meal skipping, which should be avoided while on semaglutide . - Continue semaglutide  2 mg weekly. - Advise against meal skipping while on semaglutide . - Send prescription to [facility] pharmacy.  Obesity She has lost 37 pounds from a peak weight of 207 pounds. Weight loss has plateaued, likely due to reduced physical activity and caloric intake reaching maintenance levels. Body composition improved with a decrease in body fat percentage from 44% to 42% and visceral fat from 12 to 11. Muscle mass increased by 3 pounds. Current caloric intake is approximately 1200 calories, but further reduction to 1000 calories is recommended to overcome plateau. Physical activity is limited due to shoulder issues, but she is engaging in walking and biking. A weight loss target of 3-4 pounds per month is recommended, aligning with her age and current health status. - Provide a 7-day meal plan targeting 1000 calories per day. - Advise increasing physical activity as shoulder condition allows. - Recommend a weight loss target of 3-4 pounds per month. - Schedule follow-up in 4 weeks.  Obstructive sleep apnea on CPAP Obstructive sleep apnea is managed with CPAP therapy. No new issues reported with CPAP use.      Objective   Physical Exam:  Blood pressure 119/76, pulse 95, temperature 98.7 F (37.1 C), height 5\' 2"  (1.575 m),  weight 170 lb (77.1 kg), SpO2 100%. Body mass index is 31.09 kg/m.  General: She is overweight, cooperative, alert, well developed, and in no acute distress. PSYCH: Has normal mood, affect and thought process.   HEENT: EOMI, sclerae are anicteric. Lungs: Normal breathing effort, no conversational dyspnea. Extremities: No edema.  Neurologic: No gross sensory or motor deficits. No tremors or fasciculations noted.    Diagnostic Data Reviewed:  BMET    Component Value Date/Time   NA 141 12/09/2023 0842   NA 138 03/11/2023 0845   K 4.2 12/09/2023 0842   CL 104 12/09/2023 0842   CO2 30 12/09/2023 0842   GLUCOSE 93 12/09/2023 0842   BUN 9 12/09/2023 0842   BUN 9 03/11/2023 0845   CREATININE 0.76 12/09/2023 0842   CALCIUM  9.6 12/09/2023 0842   GFRNONAA >60 12/09/2023 0842   GFRAA 119 05/11/2020 0935   Lab Results  Component Value Date   HGBA1C 5.7 (A) 01/11/2024   HGBA1C 6.5 (H) 04/02/2018   Lab Results  Component Value Date   INSULIN  6.1 03/11/2023   INSULIN  5.8 11/21/2021   Lab Results  Component Value Date   TSH 0.650 01/11/2024   CBC    Component Value Date/Time   WBC 5.8 12/09/2023 0842   WBC 5.8 10/09/2023 0443   RBC 4.76 12/09/2023  0842   HGB 12.6 12/09/2023 0842   HGB 11.9 06/25/2022 1420   HCT 40.3 12/09/2023 0842   HCT 38.5 06/25/2022 1420   PLT 345 12/09/2023 0842   PLT 398 06/25/2022 1420   MCV 84.7 12/09/2023 0842   MCV 81 06/25/2022 1420   MCH 26.5 12/09/2023 0842   MCHC 31.3 12/09/2023 0842   RDW 14.8 12/09/2023 0842   RDW 13.5 06/25/2022 1420   Iron Studies    Component Value Date/Time   IRON 75 12/09/2023 0842   IRON 24 (L) 06/25/2022 1420   TIBC 386 12/09/2023 0842   TIBC 351 06/25/2022 1420   FERRITIN 166 12/09/2023 0843   FERRITIN 81 06/25/2022 1420   IRONPCTSAT 19 12/09/2023 0842   IRONPCTSAT 7 (LL) 06/25/2022 1420   Lipid Panel     Component Value Date/Time   CHOL 144 09/04/2022 1027   TRIG 52 09/04/2022 1027   HDL 70  09/04/2022 1027   CHOLHDL 2.7 05/02/2021 1158   CHOLHDL 2.9 04/02/2018 1115   VLDL 51 (H) 04/02/2018 1115   LDLCALC 63 09/04/2022 1027   Hepatic Function Panel     Component Value Date/Time   PROT 7.3 12/09/2023 0842   PROT 6.5 03/11/2023 0845   ALBUMIN  4.4 12/09/2023 0842   ALBUMIN  4.4 03/11/2023 0845   AST 15 12/09/2023 0842   ALT 17 12/09/2023 0842   ALKPHOS 73 12/09/2023 0842   BILITOT 0.4 12/09/2023 0842      Component Value Date/Time   TSH 0.650 01/11/2024 1100   Nutritional Lab Results  Component Value Date   VD25OH 78.3 03/11/2023   VD25OH 54.3 09/04/2022   VD25OH 34.2 11/21/2021    Medications: Outpatient Encounter Medications as of 01/26/2024  Medication Sig   Accu-Chek FastClix Lancets MISC use 1 strip to check blood sugar up to 7 times a week   atorvastatin  (LIPITOR) 80 MG tablet Take 1 tablet (80 mg total) by mouth daily.   bacitracin -polymyxin b  (POLYSPORIN ) ophthalmic ointment Apply a small amount into the right eye 3 (three) times daily.   cycloSPORINE  (RESTASIS ) 0.05 % ophthalmic emulsion Place 1 drop into both eyes 2 (two) times daily.   doxepin  (SINEQUAN ) 10 MG capsule Take 1 capsule (10 mg total) by mouth at bedtime.   erythromycin  ophthalmic ointment Apply a small amount into the right eye 3 (three) times daily.   ferrous sulfate  325 (65 FE) MG tablet Take 325 mg by mouth daily with breakfast.   fluorometholone  (FML) 0.1 % ophthalmic suspension Place 1 drop into the right eye 4 (four) times daily.   fluticasone  (FLONASE ) 50 MCG/ACT nasal spray Place 1 spray into both nostrils daily as needed for allergies or rhinitis   Glycerin -Hypromellose-PEG 400 (DRY EYE RELIEF DROPS) 0.2-0.2-1 % SOLN Place 1 drop into both eyes in the morning and at bedtime.   lamoTRIgine  (LAMICTAL ) 100 MG tablet Take 1 tablet by mouth once a day   linaclotide  (LINZESS ) 145 MCG CAPS capsule Take 1 capsule (145 mcg total) by mouth daily before breakfast. Please schedule a yearly  follow up for further refills. Thank you   Multiple Vitamins-Minerals (MULTIVITAMIN WITH MINERALS) tablet Take 1 tablet by mouth daily.   ondansetron  (ZOFRAN ) 4 MG tablet Take 1 tablet (4 mg total) by mouth every 6 (six) hours as needed for nausea.   polyethylene glycol (MIRALAX  / GLYCOLAX ) 17 g packet Mix 1 packet (17 g total) in 8 oz clear liquid and drink by mouth daily as needed for mild constipation.  prazosin  (MINIPRESS ) 1 MG capsule Take 1 capsule by mouth at bedtime   triamcinolone  cream (KENALOG ) 0.1 % Apply 1 Application topically daily. Apply underneath breast as needed   triamcinolone  ointment (KENALOG ) 0.5 % Apply 1 Application topically daily as needed. Apply to hands as needed.   [DISCONTINUED] Semaglutide , 2 MG/DOSE, 8 MG/3ML SOPN Inject 2 mg as directed once a week.   doxepin  (SINEQUAN ) 10 MG capsule Take 1 capsule (10 mg total) by mouth at bedtime as needed for sleep (Patient not taking: Reported on 01/26/2024)   lamoTRIgine  (LAMICTAL ) 100 MG tablet Take 1 tablet (100 mg total) by mouth daily. (Patient not taking: Reported on 01/26/2024)   Semaglutide , 2 MG/DOSE, 8 MG/3ML SOPN Inject 2 mg into the skin once a week.   No facility-administered encounter medications on file as of 01/26/2024.     Follow-Up   Return in about 4 weeks (around 02/23/2024) for For Weight Mangement with Dr. Allie Area.Aaron Aas She was informed of the importance of frequent follow up visits to maximize her success with intensive lifestyle modifications for her multiple health conditions.  Attestation Statement   Reviewed by clinician on day of visit: allergies, medications, problem list, medical history, surgical history, family history, social history, and previous encounter notes.     Ladd Picker, MD

## 2024-01-28 ENCOUNTER — Ambulatory Visit: Payer: Self-pay | Admitting: Internal Medicine

## 2024-02-03 ENCOUNTER — Ambulatory Visit: Payer: MEDICAID | Admitting: Student

## 2024-02-04 ENCOUNTER — Ambulatory Visit: Payer: MEDICAID | Admitting: Student

## 2024-02-04 ENCOUNTER — Ambulatory Visit: Payer: Self-pay

## 2024-02-04 NOTE — Telephone Encounter (Signed)
 FYI Only or Action Required?: Action required by provider  Patient was last seen in primary care on 01/26/2024 by Ladd Picker, MD. Called Nurse Triage reporting Leg Pain. Symptoms began about a month ago. Interventions attempted: Nothing. Symptoms are: gradually worsening.  Triage Disposition: See PCP Within 2 Weeks   Pt requested this far out due to other appts.      Patient/caregiver understands and will follow disposition?: YesReason for Disposition  [1] MILD pain (e.g., does not interfere with normal activities) AND [2] present > 7 days  Answer Assessment - Initial Assessment Questions 1. ONSET: "When did the pain start?"      Last year but got worse in April  2. LOCATION: "Where is the pain located?"      Right leg 3. PAIN: "How bad is the pain?"    (Scale 1-10; or mild, moderate, severe)   -  MILD (1-3): doesn't interfere with normal activities    -  MODERATE (4-7): interferes with normal activities (e.g., work or school) or awakens from sleep, limping    -  SEVERE (8-10): excruciating pain, unable to do any normal activities, unable to walk     Mild   5. CAUSE: "What do you think is causing the leg pain?"     Hardware removed 6. OTHER SYMPTOMS: "Do you have any other symptoms?" (e.g., chest pain, back pain, breathing difficulty, swelling, rash, fever, numbness, weakness)     Burning/tingling in both legs but mostly in leg         Hip replacement in 2023. Knee replacement hardware removed 2023  Protocols used: Leg Pain-A-AH

## 2024-02-04 NOTE — Telephone Encounter (Signed)
 Copied from CRM [id number]. Topic: Clinical - Red Word Triage >> Feb 04, 2024  8:29 AM Danelle Dunning F wrote: [facility] that prompted transfer to Nurse Triage:   Burning sensation in legs; specifically the right leg where the patient has had hardware taken out and a full knee replacement  Sensation comes and goes but patient is concerned due to high A1C levels

## 2024-02-10 ENCOUNTER — Ambulatory Visit: Payer: MEDICAID | Admitting: Student

## 2024-02-10 ENCOUNTER — Other Ambulatory Visit (HOSPITAL_COMMUNITY): Payer: Self-pay

## 2024-02-10 ENCOUNTER — Encounter: Payer: Self-pay | Admitting: Student

## 2024-02-10 VITALS — BP 127/80 | HR 88 | Temp 98.2°F | Ht 62.0 in | Wt 176.0 lb

## 2024-02-10 DIAGNOSIS — G629 Polyneuropathy, unspecified: Secondary | ICD-10-CM | POA: Diagnosis not present

## 2024-02-10 DIAGNOSIS — S8490XA Injury of unspecified nerve at lower leg level, unspecified leg, initial encounter: Secondary | ICD-10-CM | POA: Insufficient documentation

## 2024-02-10 MED ORDER — GABAPENTIN 100 MG PO CAPS
100.0000 mg | ORAL_CAPSULE | Freq: Three times a day (TID) | ORAL | 2 refills | Status: DC
  Filled 2024-02-10: qty 90, 30d supply, fill #0

## 2024-02-10 MED ORDER — CAPSAICIN 0.035 % EX CREA
1.0000 | TOPICAL_CREAM | Freq: Two times a day (BID) | CUTANEOUS | 0 refills | Status: AC | PRN
  Filled 2024-02-10 – 2024-04-05 (×3): qty 42.5, fill #0

## 2024-02-10 NOTE — Progress Notes (Signed)
 CC: burning in legs  HPI:  [name] is a 63 y.o. female living with a history stated below and presents today for the chief complaint stated above. Please see problem based assessment and plan for additional details.  Past Medical History:  Diagnosis Date   Anemia    Anxiety    Arthritis    Back pain    Bipolar depression (HCC) 09/2015   Chronic knee pain after total replacement of right knee joint 10/02/2021   Constipation    Depression 06/27/2021   Dizziness    Generalized abdominal discomfort 05/06/2021   History of colon polyps    History of kidney stones    History of patellar fracture 2016   History of tibial fracture 2016   Hyperlipidemia    IBS (irritable bowel syndrome)    Joint pain    Night terrors    Night terrors, adult 09/2015   OSA (obstructive sleep apnea) 04/07/2022   Osteoarthritis    Pain in joint of right shoulder 09/16/2021   Pain of right knee after injury 03/10/2018   Post concussion syndrome 12/20/2019   Pre-diabetes    S/P hardware removal, right tibia 09/17/2021   S/P total knee arthroplasty, 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 30 tablet 11   bacitracin -polymyxin b  (POLYSPORIN ) ophthalmic ointment Apply a small amount into the right eye 3 (three) times daily. 3.5 g 0   cycloSPORINE  (RESTASIS ) 0.05 % ophthalmic emulsion Place 1 drop into both eyes 2 (two) times daily. 180 each 3   doxepin  (SINEQUAN ) 10 MG capsule Take 1 capsule (10 mg total) by mouth at bedtime as needed for sleep (Patient not taking:  Reported on 01/26/2024) 30 capsule 3   doxepin  (SINEQUAN ) 10 MG capsule Take 1 capsule (10 mg total) by mouth at bedtime. 30 capsule 2   erythromycin  ophthalmic ointment Apply a small amount into the right eye 3 (three) times daily. 3.5 g 0   ferrous sulfate  325 (65 FE) MG tablet Take 325 mg by mouth daily with breakfast.     fluorometholone  (FML) 0.1 % ophthalmic suspension Place 1 drop into the right eye 4 (four) times daily. 5 mL 0   fluticasone  (FLONASE ) 50 MCG/ACT nasal spray Place 1 spray into both nostrils daily as needed for allergies or rhinitis 16 g 2   Glycerin -Hypromellose-PEG 400 (DRY EYE RELIEF DROPS) 0.2-0.2-1 % SOLN Place 1 drop into both eyes in the morning and at bedtime.     lamoTRIgine  (LAMICTAL ) 100 MG tablet Take 1 tablet (100 mg total) by mouth daily. (Patient not taking: Reported on 01/26/2024) 30 tablet 3   lamoTRIgine  (LAMICTAL ) 100 MG tablet Take 1 tablet by mouth once a day 30 tablet 2   linaclotide  (LINZESS ) 145 MCG CAPS capsule Take 1 capsule (145 mcg total) by mouth daily before breakfast. Please schedule a yearly follow up for further refills. Thank you 30 capsule 1   Multiple Vitamins-Minerals (MULTIVITAMIN WITH MINERALS) tablet Take 1 tablet by  mouth daily.     ondansetron  (ZOFRAN ) 4 MG tablet Take 1 tablet (4 mg total) by mouth every 6 (six) hours as needed for nausea. 20 tablet 0   polyethylene glycol (MIRALAX  / GLYCOLAX ) 17 g packet Mix 1 packet (17 g total) in 8 oz clear liquid and drink by mouth daily as needed for mild constipation. 14 each 0   prazosin  (MINIPRESS ) 1 MG capsule Take 1 capsule by mouth at bedtime 30 capsule 2   Semaglutide , 2 MG/DOSE, 8 MG/3ML SOPN Inject 2 mg into the skin once a week. 3 mL 1   triamcinolone  cream (KENALOG ) 0.1 % Apply 1 Application topically daily. Apply underneath breast as needed 45 g 2   triamcinolone  ointment (KENALOG ) 0.5 % Apply 1 Application topically daily as needed. Apply to hands as needed. 30 g 2   No current  facility-administered medications on file prior to visit.    Family History  Problem Relation Age of Onset   Hyperlipidemia Mother    Hypertension Mother    Colon polyps Mother    Heart disease Mother    Diabetes Mother    Depression Mother    Anxiety disorder Mother    Bipolar disorder Mother    Eating disorder Mother    Obesity Mother    Arthritis/Rheumatoid Father    Hyperlipidemia Maternal Aunt    Hypertension Maternal Aunt    Cancer Maternal Aunt        blood cancer   Colon polyps Maternal Grandmother    Stroke Other    Colon cancer Neg Hx    Esophageal cancer Neg Hx    Stomach cancer Neg Hx    Rectal cancer Neg Hx    Sleep apnea Neg Hx    Breast cancer Neg Hx     Social History   Socioeconomic History   Marital status: Single    Spouse name: Not on file   Number of children: 2   Years of education: 2 years of college   Highest education level: Not on file  Occupational History   Not on file  Tobacco Use   Smoking status: Former    Current packs/day: 0.00    Average packs/day: 0.3 packs/day for 7.0 years (1.8 ttl pk-yrs)    Types: Cigarettes    Start date: 12/22/2010    Quit date: 12/21/2017    Years since quitting: 6.1   Smokeless tobacco: Never  Vaping Use   Vaping status: Never Used  Substance and Sexual Activity   Alcohol  use: Yes    Comment: special occasions   Drug use: No   Sexual activity: Yes    Birth control/protection: Surgical    Comment: Celibate  Other Topics Concern   Not on file  Social History Narrative   Lives at home with her fiance.   Right-handed.   No daily use of caffeine.   Social Drivers of Corporate investment banker Strain: Not on file  Food Insecurity: No Food Insecurity (09/25/2022)   Hunger Vital Sign    Worried About Running Out of Food in the Last Year: Never true    Ran Out of Food in the Last Year: Never true  Transportation Needs: No Transportation Needs (05/14/2022)   PRAPARE - Scientist, research (physical sciences) (Medical): No    Lack of Transportation (Non-Medical): No  Physical Activity: Not on file  Stress: Not on file  Social Connections: Moderately Isolated (09/25/2022)   Social Connection and Isolation Panel [NHANES]  Frequency of Communication with Friends and Family: More than three times a week    Frequency of Social Gatherings with Friends and Family: More than three times a week    Attends Religious Services: More than 4 times per year    Active Member of [employer] or Organizations: Not on file    Attends Banker Meetings: Never    Marital Status: Divorced  Catering manager Violence: Not At Risk (09/25/2022)   Humiliation, Afraid, Rape, and Kick questionnaire    Fear of Current or Ex-Partner: No    Emotionally Abused: No    Physically Abused: No    Sexually Abused: No    Review of Systems: ROS negative except for what is noted on the assessment and plan.  Vitals:   02/10/24 0955 02/10/24 0956  BP:  127/80  Pulse:  88  Temp:  98.2 F (36.8 C)  SpO2:  99%  Weight: 176 lb (79.8 kg)   Height: 5' 2 (1.575 m)    Physical Exam: Constitutional: well-appearing female, sitting in chair, in no acute distress Superficial numbness and burning pain over her right anterior shin and left hip, regions of previous surgeries; large vertical scar present over anterior shin extending above the knee; no erythema or swelling appreciated in these areas, no tenderness to palpation.  No tenderness to palpation in all other areas of the legs, normal range of motion and strength Extremities are well-perfused  Assessment & Plan:   Patient discussed with Dr. Broadus Canes  Neuropathy She presents to the clinic today with focal areas of numbness and burning pain at her previous surgical sites.  She had a total left hip arthroplasty in 2023, right knee arthroplasty in 2023, and some hardware removal from the right tibia in 2023.  Her numbness and burning are predominantly over the  right tibial region and the left hip region.  She does not have symptoms anywhere else in her extremities. There are no concerning skin changes. TSH and B12 were normal at last visit.   I suspect the symptoms are from peripheral nerve damage because during her previous surgeries.  I will prescribe her topical capsaicin and gabapentin  to help treat her nerve pain.  We will follow up on this in 2 months.   Dorthy Gavia, MD [facility] Internal Medicine, PGY-1 Phone: [phone] Date 02/10/2024 Time 11:25 AM

## 2024-02-10 NOTE — Patient Instructions (Signed)
 Thank you, Meghan Wilson for allowing us  to provide your care today. Today we discussed your nerve pain/burning.  As we discussed, this is likely due to damage of your peripheral nerves due to your surgeries. I have prescribed a cream and a pill medication to take for nerve pain relief.   I have ordered the following medication/changed the following medications:   Start the following medications: Meds ordered this encounter  Medications   gabapentin  (NEURONTIN ) 100 MG capsule    Sig: Take 1 capsule (100 mg total) by mouth 3 (three) times daily.    Dispense:  90 capsule    Refill:  2   Capsaicin 0.035 % CREA    Sig: Apply 1 application  topically 2 (two) times daily as needed.    Dispense:  42.5 g    Refill:  0     Follow up: 2-3 months   We look forward to seeing you next time. Please call our clinic at [phone] if you have any questions or concerns. The best time to call is Monday-Friday from 9am-4pm, but there is someone available 24/7. If after hours or the weekend, call the main hospital number and ask for the Internal Medicine Resident On-Call. If you need medication refills, please notify your pharmacy one week in advance and they will send us  a request.   Thank you for trusting me with your care. Wishing you the best!   Dorthy Gavia, MD [facility]

## 2024-02-10 NOTE — Assessment & Plan Note (Addendum)
 She presents to the clinic today with focal areas of numbness and burning pain at her previous surgical sites.  She had a total left hip arthroplasty in 2023, right knee arthroplasty in 2023, and some hardware removal from the right tibia in 2023.  Her numbness and burning are predominantly over the right tibial region and the left hip region.  She does not have symptoms anywhere else in her extremities. There are no concerning skin changes. TSH and B12 were normal at last visit.   I suspect the symptoms are from peripheral nerve damage because during her previous surgeries.  I will prescribe her topical capsaicin and gabapentin  to help treat her nerve pain.  We will follow up on this in 2 months.

## 2024-02-15 ENCOUNTER — Encounter: Payer: Self-pay | Admitting: *Deleted

## 2024-02-18 NOTE — Progress Notes (Signed)
 Internal Medicine Clinic Attending  Case discussed with the resident at the time of the visit.  We reviewed the resident's history and exam and pertinent patient test results.  I agree with the assessment, diagnosis, and plan of care documented in the resident's note.

## 2024-02-19 ENCOUNTER — Other Ambulatory Visit (HOSPITAL_COMMUNITY): Payer: Self-pay

## 2024-02-19 ENCOUNTER — Other Ambulatory Visit: Payer: Self-pay | Admitting: Internal Medicine

## 2024-02-19 DIAGNOSIS — E119 Type 2 diabetes mellitus without complications: Secondary | ICD-10-CM

## 2024-02-22 ENCOUNTER — Other Ambulatory Visit: Payer: Self-pay | Admitting: Student

## 2024-02-22 ENCOUNTER — Ambulatory Visit: Payer: Self-pay | Admitting: Student

## 2024-02-22 ENCOUNTER — Other Ambulatory Visit (HOSPITAL_COMMUNITY): Payer: Self-pay

## 2024-02-22 ENCOUNTER — Telehealth: Payer: Self-pay | Admitting: *Deleted

## 2024-02-22 DIAGNOSIS — E119 Type 2 diabetes mellitus without complications: Secondary | ICD-10-CM

## 2024-02-22 MED ORDER — ACCU-CHEK FASTCLIX LANCETS MISC
1.0000 | 3 refills | Status: AC
  Filled 2024-02-22: qty 102, 102d supply, fill #0
  Filled 2024-04-18: qty 102, 102d supply, fill #1
  Filled 2024-05-20: qty 102, 100d supply, fill #1
  Filled 2024-09-08: qty 102, 100d supply, fill #2

## 2024-02-22 MED ORDER — GLUCOSE BLOOD VI STRP
ORAL_STRIP | 3 refills | Status: AC
  Filled 2024-02-22: qty 100, 90d supply, fill #0
  Filled 2024-04-18 – 2024-05-20 (×2): qty 100, 90d supply, fill #1
  Filled 2024-09-08: qty 100, 90d supply, fill #2

## 2024-02-22 NOTE — Telephone Encounter (Unsigned)
 Copied from CRM [id number]. Topic: Clinical - Prescription Issue >> Feb 22, 2024  2:03 PM Laurier C wrote: Reason for CRM: Patient states she recv'd her lancets but the test strips have NOT been sent to the pharmacy.

## 2024-02-22 NOTE — Telephone Encounter (Signed)
 Medication sent to pharmacy

## 2024-02-22 NOTE — Telephone Encounter (Signed)
 Copied from CRM [id number]. Topic: Clinical - Medication Question >> Feb 19, 2024  9:10 AM Miquel SAILOR wrote: Reason for CRM: Accu-Chek FastClix Lancets MISC [[id number]]  DISCONTINUED glucose blood test strip [[id number]]  DISCONTINUED  Patient request to refill due to out of both. Needs this ASAP. [phone] Needs call back

## 2024-02-29 ENCOUNTER — Ambulatory Visit (INDEPENDENT_AMBULATORY_CARE_PROVIDER_SITE_OTHER): Payer: MEDICAID | Admitting: Internal Medicine

## 2024-03-01 ENCOUNTER — Encounter: Payer: Self-pay | Admitting: Nurse Practitioner

## 2024-03-01 ENCOUNTER — Encounter (INDEPENDENT_AMBULATORY_CARE_PROVIDER_SITE_OTHER): Payer: Self-pay | Admitting: Internal Medicine

## 2024-03-01 ENCOUNTER — Other Ambulatory Visit (HOSPITAL_COMMUNITY): Payer: Self-pay

## 2024-03-01 ENCOUNTER — Ambulatory Visit (INDEPENDENT_AMBULATORY_CARE_PROVIDER_SITE_OTHER): Payer: MEDICAID | Admitting: Internal Medicine

## 2024-03-01 VITALS — BP 114/75 | HR 91 | Temp 98.5°F | Ht 62.0 in | Wt 172.0 lb

## 2024-03-01 DIAGNOSIS — G4733 Obstructive sleep apnea (adult) (pediatric): Secondary | ICD-10-CM

## 2024-03-01 DIAGNOSIS — E1169 Type 2 diabetes mellitus with other specified complication: Secondary | ICD-10-CM

## 2024-03-01 DIAGNOSIS — E7801 Familial hypercholesterolemia: Secondary | ICD-10-CM

## 2024-03-01 DIAGNOSIS — Z6835 Body mass index (BMI) 35.0-35.9, adult: Secondary | ICD-10-CM

## 2024-03-01 DIAGNOSIS — E66812 Obesity, class 2: Secondary | ICD-10-CM | POA: Diagnosis not present

## 2024-03-01 DIAGNOSIS — E669 Obesity, unspecified: Secondary | ICD-10-CM

## 2024-03-01 DIAGNOSIS — Z7985 Long-term (current) use of injectable non-insulin antidiabetic drugs: Secondary | ICD-10-CM

## 2024-03-01 MED ORDER — SEMAGLUTIDE (2 MG/DOSE) 8 MG/3ML ~~LOC~~ SOPN
2.0000 mg | PEN_INJECTOR | SUBCUTANEOUS | 1 refills | Status: DC
Start: 2024-03-01 — End: 2024-04-04
  Filled 2024-03-01: qty 3, 28d supply, fill #0

## 2024-03-01 NOTE — Assessment & Plan Note (Signed)
 On CPAP with reported good compliance. Continue PAP therapy. Losing 15% or more of body weight may improve AHI.

## 2024-03-01 NOTE — Assessment & Plan Note (Signed)
 Her A1c is 5.5 and improved from 6.5.  She is currently on semaglutide  2 mg a week .  Her weight loss has reached a plateau she may benefit from switching to Mounjaro if covered by her insurance as it is more effective for weight management.

## 2024-03-01 NOTE — Progress Notes (Signed)
 Office: [phone]  /  Fax: [phone]  Weight Summary and Body Composition Analysis (BIA)  Vitals Temp: 98.5 F (36.9 C) BP: 114/75 Pulse Rate: 91 SpO2: 98 %   Anthropometric Measurements Height: 5' 2 (1.575 m) Weight: 172 lb (78 kg) BMI (Calculated): 31.45 Weight at Last Visit: 170 lb Weight Lost Since Last Visit: 0 lb Weight Gained Since Last Visit: 2 lb Starting Weight: 195 lb Total Weight Loss (lbs): 23 lb (10.4 kg) Peak Weight: 207 lb   Body Composition  Body Fat %: 44.2 % Fat Mass (lbs): 76.2 lbs Muscle Mass (lbs): 91.4 lbs Total Body Water  (lbs): 64.2 lbs Visceral Fat Rating : 12    RMR: [id number]  Today's Visit #: 33  Starting Date: 11/21/21   Subjective   Chief Complaint: Obesity  Interval History Discussed the use of AI scribe software for clinical note transcription with the patient, who gave verbal consent to proceed.  History of Present Illness   Meghan Wilson is a 63 year old female who presents for medical weight management.  She is on Ozempic  2 mg once weekly and follows a 1000 calorie nutrition plan with fair consistency. She tracks her food intake, focusing on whole foods, but feels she is not getting enough protein and occasionally skips meals. She maintains adequate hydration. Her exercise routine includes strength training, cardio, and yoga or stretching five days a week for about 60 minutes each session. Despite these efforts, she notes fluctuations in her weight, often shifting by two to three pounds, and has experienced looser clothing.  She has a history of sleep apnea for which she uses CPAP. Her medication regimen includes gabapentin  and doxepin , which could contribute to weight gain. She is also managing type 2 diabetes and hyperlipidemia.  Her daily routine includes preparing meals for her fianc and engaging in Bible study, which sometimes leads to her skipping meals. She uses protein drinks and is exploring options for  convenient, protein-rich meals.  She has experienced a burn on her arm from cooking, which she attributes to accidentally touching the oven. She jokes about the incident, indicating it was not serious.       Challenges affecting patient progress: none, medical comorbidities, and menopause.    Pharmacotherapy for weight management: She is currently taking Ozempic  with diabetes as the primary indication with adequate clinical response  and without side effects..   Assessment and Plan   Treatment Plan For Obesity:  Recommended Dietary Goals  [name] is currently in the action stage of change. As such, her goal is to continue weight management plan. She has agreed to: continue current plan  Behavioral Health and Counseling  We discussed the following behavioral modification strategies today: continue to work on maintaining a reduced calorie state, getting the recommended amount of protein, incorporating whole foods, making healthy choices, staying well hydrated and practicing mindfulness when eating..  Additional education and resources provided today: None  Recommended Physical Activity Goals  [name] has been advised to work up to 150 minutes of moderate intensity aerobic activity a week and strengthening exercises 2-3 times per week for cardiovascular health, weight loss maintenance and preservation of muscle mass.   She has agreed to :  continue to gradually increase the amount and intensity of exercise routine  Medical Interventions and Pharmacotherapy  We discussed various medication options to help [name] with her weight loss efforts and we both agreed to : Patient seems to be entering a plateau she has been on semaglutide  and hemoglobin  A1c has improved but her weight loss has slowed.  We will consider switching to Mounjaro at the next office visit  Associated Conditions Impacted by Obesity Treatment  Assessment & Plan Type 2 diabetes mellitus with obesity (HCC) Her A1c is  5.5 and improved from 6.5.  She is currently on semaglutide  2 mg a week .  Her weight loss has reached a plateau she may benefit from switching to Mounjaro if covered by her insurance as it is more effective for weight management. OSA on CPAP On CPAP with reported good compliance. Continue PAP therapy. Losing 15% or more of body weight may improve AHI.    Familial hypercholesterolemia LDL is at goal.  On high intensity statin therapy with atorvastatin  80 mg a day.   Her 10 year risk is: The 10-year ASCVD risk score (Arnett DK, et al., 2019) is: 10.4%  Lab Results  Component Value Date   CHOL 144 09/04/2022   HDL 70 09/04/2022   LDLCALC 63 09/04/2022   TRIG 52 09/04/2022   CHOLHDL 2.7 05/02/2021    Continue high intensity statin therapy no adverse effects reported.  Continue weight loss therapy and reduce saturated fats in diet to less than 10% of daily calories.      Class 2 severe obesity with serious comorbidity and body mass index (BMI) of 35.0 to 35.9 in adult, unspecified obesity type (HCC) Undergoing medical weight management with Ozempic  2 mg weekly. Following a 1000 calorie nutrition plan with fair adherence. Exercises five days a week for 60 minutes, including strength, cardio, and yoga/stretching. Despite weight fluctuations, clothes fit looser, indicating improved body composition. Concerns about inadequate protein intake and meal skipping, potentially affecting weight management. Discussed importance of spreading protein intake throughout the day to aid muscle building and prevent calorie storage as fat. Potential switch to Mounjaro, a medication with stronger appetite suppression, discussed if weight loss plateaus.  - Continue Ozempic  2 mg weekly for four more weeks - Reassess weight management progress in four weeks - Consider switching to Mounjaro if weight loss plateaus - Encourage adherence to a balanced diet with adequate protein intake - Advise on time management  strategies to ensure regular meals - Discuss potential protein sources and meal planning          Objective   Physical Exam:  Blood pressure 114/75, pulse 91, temperature 98.5 F (36.9 C), height 5' 2 (1.575 m), weight 172 lb (78 kg), SpO2 98%. Body mass index is 31.46 kg/m.  General: She is overweight, cooperative, alert, well developed, and in no acute distress. PSYCH: Has normal mood, affect and thought process.   HEENT: EOMI, sclerae are anicteric. Lungs: Normal breathing effort, no conversational dyspnea. Extremities: No edema.  Neurologic: No gross sensory or motor deficits. No tremors or fasciculations noted.    Diagnostic Data Reviewed:  BMET    Component Value Date/Time   NA 141 12/09/2023 0842   NA 138 03/11/2023 0845   K 4.2 12/09/2023 0842   CL 104 12/09/2023 0842   CO2 30 12/09/2023 0842   GLUCOSE 93 12/09/2023 0842   BUN 9 12/09/2023 0842   BUN 9 03/11/2023 0845   CREATININE 0.76 12/09/2023 0842   CALCIUM  9.6 12/09/2023 0842   GFRNONAA >60 12/09/2023 0842   GFRAA 119 05/11/2020 0935   Lab Results  Component Value Date   HGBA1C 5.7 (A) 01/11/2024   HGBA1C 6.5 (H) 04/02/2018   Lab Results  Component Value Date   INSULIN  6.1 03/11/2023   INSULIN   5.8 11/21/2021   Lab Results  Component Value Date   TSH 0.650 01/11/2024   CBC    Component Value Date/Time   WBC 5.8 12/09/2023 0842   WBC 5.8 10/09/2023 0443   RBC 4.76 12/09/2023 0842   HGB 12.6 12/09/2023 0842   HGB 11.9 06/25/2022 1420   HCT 40.3 12/09/2023 0842   HCT 38.5 06/25/2022 1420   PLT 345 12/09/2023 0842   PLT 398 06/25/2022 1420   MCV 84.7 12/09/2023 0842   MCV 81 06/25/2022 1420   MCH 26.5 12/09/2023 0842   MCHC 31.3 12/09/2023 0842   RDW 14.8 12/09/2023 0842   RDW 13.5 06/25/2022 1420   Iron Studies    Component Value Date/Time   IRON 75 12/09/2023 0842   IRON 24 (L) 06/25/2022 1420   TIBC 386 12/09/2023 0842   TIBC 351 06/25/2022 1420   FERRITIN 166 12/09/2023  0843   FERRITIN 81 06/25/2022 1420   IRONPCTSAT 19 12/09/2023 0842   IRONPCTSAT 7 (LL) 06/25/2022 1420   Lipid Panel     Component Value Date/Time   CHOL 144 09/04/2022 1027   TRIG 52 09/04/2022 1027   HDL 70 09/04/2022 1027   CHOLHDL 2.7 05/02/2021 1158   CHOLHDL 2.9 04/02/2018 1115   VLDL 51 (H) 04/02/2018 1115   LDLCALC 63 09/04/2022 1027   Hepatic Function Panel     Component Value Date/Time   PROT 7.3 12/09/2023 0842   PROT 6.5 03/11/2023 0845   ALBUMIN  4.4 12/09/2023 0842   ALBUMIN  4.4 03/11/2023 0845   AST 15 12/09/2023 0842   ALT 17 12/09/2023 0842   ALKPHOS 73 12/09/2023 0842   BILITOT 0.4 12/09/2023 0842      Component Value Date/Time   TSH 0.650 01/11/2024 1100   Nutritional Lab Results  Component Value Date   VD25OH 78.3 03/11/2023   VD25OH 54.3 09/04/2022   VD25OH 34.2 11/21/2021    Medications: Outpatient Encounter Medications as of 03/01/2024  Medication Sig   Accu-Chek FastClix Lancets MISC Use to check blood sugar up to 7 times a week   atorvastatin  (LIPITOR) 80 MG tablet Take 1 tablet (80 mg total) by mouth daily.   bacitracin -polymyxin b  (POLYSPORIN ) ophthalmic ointment Apply a small amount into the right eye 3 (three) times daily.   Capsaicin  0.035 % CREA Apply 1 application  topically 2 (two) times daily as needed.   cycloSPORINE  (RESTASIS ) 0.05 % ophthalmic emulsion Place 1 drop into both eyes 2 (two) times daily.   doxepin  (SINEQUAN ) 10 MG capsule Take 1 capsule (10 mg total) by mouth at bedtime as needed for sleep   doxepin  (SINEQUAN ) 10 MG capsule Take 1 capsule (10 mg total) by mouth at bedtime.   erythromycin  ophthalmic ointment Apply a small amount into the right eye 3 (three) times daily.   ferrous sulfate  325 (65 FE) MG tablet Take 325 mg by mouth daily with breakfast.   fluorometholone  (FML) 0.1 % ophthalmic suspension Place 1 drop into the right eye 4 (four) times daily.   fluticasone  (FLONASE ) 50 MCG/ACT nasal spray Place 1 spray  into both nostrils daily as needed for allergies or rhinitis   gabapentin  (NEURONTIN ) 100 MG capsule Take 1 capsule (100 mg total) by mouth 3 (three) times daily.   glucose blood test strip USE TO CHECK BLOOD GLUCOSE UP TO 7 TIMES A WEEK.   Glycerin -Hypromellose-PEG 400 (DRY EYE RELIEF DROPS) 0.2-0.2-1 % SOLN Place 1 drop into both eyes in the morning and at bedtime.  lamoTRIgine  (LAMICTAL ) 100 MG tablet Take 1 tablet (100 mg total) by mouth daily.   lamoTRIgine  (LAMICTAL ) 100 MG tablet Take 1 tablet by mouth once a day   linaclotide  (LINZESS ) 145 MCG CAPS capsule Take 1 capsule (145 mcg total) by mouth daily before breakfast. Please schedule a yearly follow up for further refills. Thank you   Multiple Vitamins-Minerals (MULTIVITAMIN WITH MINERALS) tablet Take 1 tablet by mouth daily.   ondansetron  (ZOFRAN ) 4 MG tablet Take 1 tablet (4 mg total) by mouth every 6 (six) hours as needed for nausea.   polyethylene glycol (MIRALAX  / GLYCOLAX ) 17 g packet Mix 1 packet (17 g total) in 8 oz clear liquid and drink by mouth daily as needed for mild constipation.   prazosin  (MINIPRESS ) 1 MG capsule Take 1 capsule by mouth at bedtime   triamcinolone  cream (KENALOG ) 0.1 % Apply 1 Application topically daily. Apply underneath breast as needed   triamcinolone  ointment (KENALOG ) 0.5 % Apply 1 Application topically daily as needed. Apply to hands as needed.   [DISCONTINUED] Semaglutide , 2 MG/DOSE, 8 MG/3ML SOPN Inject 2 mg into the skin once a week.   Semaglutide , 2 MG/DOSE, 8 MG/3ML SOPN Inject 2 mg into the skin once a week.   No facility-administered encounter medications on file as of 03/01/2024.     Follow-Up   Return in about 4 weeks (around 03/29/2024) for For Weight Mangement with Dr. Francyne.SABRA She was informed of the importance of frequent follow up visits to maximize her success with intensive lifestyle modifications for her multiple health conditions.  Attestation Statement   Reviewed by clinician  on day of visit: allergies, medications, problem list, medical history, surgical history, family history, social history, and previous encounter notes.     Lucas Francyne, MD

## 2024-03-01 NOTE — Assessment & Plan Note (Signed)
 LDL is at goal.  On high intensity statin therapy with atorvastatin  80 mg a day.   Her 10 year risk is: The 10-year ASCVD risk score (Arnett DK, et al., 2019) is: 10.4%  Lab Results  Component Value Date   CHOL 144 09/04/2022   HDL 70 09/04/2022   LDLCALC 63 09/04/2022   TRIG 52 09/04/2022   CHOLHDL 2.7 05/02/2021    Continue high intensity statin therapy no adverse effects reported.  Continue weight loss therapy and reduce saturated fats in diet to less than 10% of daily calories.

## 2024-03-01 NOTE — Assessment & Plan Note (Signed)
 Undergoing medical weight management with Ozempic  2 mg weekly. Following a 1000 calorie nutrition plan with fair adherence. Exercises five days a week for 60 minutes, including strength, cardio, and yoga/stretching. Despite weight fluctuations, clothes fit looser, indicating improved body composition. Concerns about inadequate protein intake and meal skipping, potentially affecting weight management. Discussed importance of spreading protein intake throughout the day to aid muscle building and prevent calorie storage as fat. Potential switch to Mounjaro, a medication with stronger appetite suppression, discussed if weight loss plateaus.  - Continue Ozempic  2 mg weekly for four more weeks - Reassess weight management progress in four weeks - Consider switching to Mounjaro if weight loss plateaus - Encourage adherence to a balanced diet with adequate protein intake - Advise on time management strategies to ensure regular meals - Discuss potential protein sources and meal planning

## 2024-03-07 ENCOUNTER — Ambulatory Visit: Payer: MEDICAID | Admitting: Nurse Practitioner

## 2024-03-07 ENCOUNTER — Other Ambulatory Visit: Payer: MEDICAID

## 2024-03-09 ENCOUNTER — Encounter: Payer: Self-pay | Admitting: Nurse Practitioner

## 2024-03-09 ENCOUNTER — Other Ambulatory Visit (HOSPITAL_COMMUNITY): Payer: Self-pay

## 2024-03-16 ENCOUNTER — Other Ambulatory Visit: Payer: Self-pay | Admitting: Student

## 2024-03-16 ENCOUNTER — Other Ambulatory Visit: Payer: Self-pay

## 2024-03-16 ENCOUNTER — Other Ambulatory Visit (HOSPITAL_COMMUNITY): Payer: Self-pay

## 2024-03-16 MED ORDER — PRAZOSIN HCL 1 MG PO CAPS
1.0000 mg | ORAL_CAPSULE | Freq: Every day | ORAL | 2 refills | Status: AC
  Filled 2024-03-16: qty 30, 30d supply, fill #0
  Filled 2024-04-18: qty 30, 30d supply, fill #1
  Filled 2024-07-20: qty 30, 30d supply, fill #2

## 2024-03-16 MED ORDER — DOXEPIN HCL 10 MG PO CAPS
10.0000 mg | ORAL_CAPSULE | Freq: Every evening | ORAL | 2 refills | Status: DC | PRN
  Filled 2024-03-16: qty 30, 30d supply, fill #0

## 2024-03-16 MED ORDER — LAMOTRIGINE 100 MG PO TABS
100.0000 mg | ORAL_TABLET | Freq: Every day | ORAL | 2 refills | Status: DC
  Filled 2024-03-16: qty 30, 30d supply, fill #0
  Filled 2024-04-18: qty 30, 30d supply, fill #1
  Filled 2024-05-10: qty 30, 30d supply, fill #2

## 2024-03-18 ENCOUNTER — Other Ambulatory Visit (HOSPITAL_COMMUNITY): Payer: Self-pay

## 2024-03-18 MED ORDER — TRIAMCINOLONE ACETONIDE 0.5 % EX OINT
1.0000 | TOPICAL_OINTMENT | Freq: Every day | CUTANEOUS | 2 refills | Status: AC | PRN
  Filled 2024-03-18: qty 30, 30d supply, fill #0

## 2024-03-23 ENCOUNTER — Encounter: Payer: Self-pay | Admitting: Nurse Practitioner

## 2024-03-25 ENCOUNTER — Other Ambulatory Visit (HOSPITAL_COMMUNITY): Payer: Self-pay

## 2024-03-25 MED ORDER — MELOXICAM 7.5 MG PO TABS
7.5000 mg | ORAL_TABLET | Freq: Every day | ORAL | 0 refills | Status: DC
  Filled 2024-03-25: qty 10, 10d supply, fill #0

## 2024-04-04 ENCOUNTER — Encounter (HOSPITAL_COMMUNITY): Payer: Self-pay

## 2024-04-04 ENCOUNTER — Ambulatory Visit (INDEPENDENT_AMBULATORY_CARE_PROVIDER_SITE_OTHER): Admitting: Internal Medicine

## 2024-04-04 ENCOUNTER — Encounter (INDEPENDENT_AMBULATORY_CARE_PROVIDER_SITE_OTHER): Payer: Self-pay | Admitting: Internal Medicine

## 2024-04-04 ENCOUNTER — Encounter: Payer: Self-pay | Admitting: Nurse Practitioner

## 2024-04-04 ENCOUNTER — Other Ambulatory Visit: Payer: Self-pay | Admitting: Gastroenterology

## 2024-04-04 ENCOUNTER — Other Ambulatory Visit (HOSPITAL_COMMUNITY): Payer: Self-pay

## 2024-04-04 VITALS — BP 136/77 | HR 94 | Temp 98.2°F | Ht 62.0 in | Wt 167.0 lb

## 2024-04-04 DIAGNOSIS — E7800 Pure hypercholesterolemia, unspecified: Secondary | ICD-10-CM

## 2024-04-04 DIAGNOSIS — G4733 Obstructive sleep apnea (adult) (pediatric): Secondary | ICD-10-CM | POA: Diagnosis not present

## 2024-04-04 DIAGNOSIS — Z7985 Long-term (current) use of injectable non-insulin antidiabetic drugs: Secondary | ICD-10-CM | POA: Diagnosis not present

## 2024-04-04 DIAGNOSIS — Z6830 Body mass index (BMI) 30.0-30.9, adult: Secondary | ICD-10-CM

## 2024-04-04 DIAGNOSIS — E669 Obesity, unspecified: Secondary | ICD-10-CM

## 2024-04-04 DIAGNOSIS — E1169 Type 2 diabetes mellitus with other specified complication: Secondary | ICD-10-CM | POA: Diagnosis not present

## 2024-04-04 MED ORDER — TIRZEPATIDE 5 MG/0.5ML ~~LOC~~ SOAJ
5.0000 mg | SUBCUTANEOUS | 0 refills | Status: DC
  Filled 2024-04-04: qty 2, 28d supply, fill #0

## 2024-04-04 NOTE — Assessment & Plan Note (Signed)
 On CPAP with reported good compliance. Continue PAP therapy. Losing 15% or more of body weight may improve AHI.

## 2024-04-04 NOTE — Progress Notes (Signed)
 Office: [phone]  /  Fax: [phone]  Weight Summary and Body Composition Analysis (BIA)  Vitals Temp: 98.2 F (36.8 C) BP: 136/77 Pulse Rate: 94 SpO2: 99 %   Anthropometric Measurements Height: 5' 2 (1.575 m) Weight: 167 lb (75.8 kg) BMI (Calculated): 30.54 Weight at Last Visit: 172 lb Weight Lost Since Last Visit: 5 lb Weight Gained Since Last Visit: 0 lb Starting Weight: 195 lb Total Weight Loss (lbs): 28 lb (12.7 kg) Peak Weight: 207 lb   Body Composition  Body Fat %: 44 % Fat Mass (lbs): 73.4 lbs Muscle Mass (lbs): 88.8 lbs Total Body Water  (lbs): 62 lbs Visceral Fat Rating : 12    RMR: 1627  Today's Visit #: 34  Starting Date: 11/21/21   Subjective   Chief Complaint: Obesity  Interval History Discussed the use of AI scribe software for clinical note transcription with the patient, who gave verbal consent to proceed.  History of Present Illness   A 63 year old female who presents for medical weight management.  She has lost five pounds since her last office visit, following a 1000 calorie nutrition plan with good adherence. She tracks calories, eats more whole foods, gets the recommended amount of protein, and maintains accurate hydration. Occasionally, she skips meals. Her current weight is 167 pounds, down from a previous high of 207 pounds, with a goal of reaching 150 pounds.  She exercises three days a week for ninety minutes, incorporating strength training and cardio. She works out both at home and at [employer], where she uses various gym equipment and has started swimming. Despite requesting a trainer, she has not been able to consistently meet with one. She experiences soreness after workouts, particularly in her legs after intense exercise sessions.  She has been on Ozempic  since April 2023, currently at a 2 mg dose since January 2024, but it is not providing the same appetite control and sense of fullness as before. She also takes  lamotrigine , atorvastatin , doxepin , prazosin , vitamin D3, and Linzess . She does not take gabapentin  or meloxicam .  She uses a CPAP machine for sleep apnea but finds it uncomfortable and often removes it during sleep.        Challenges affecting patient progress: none.    Pharmacotherapy for weight management: She is currently taking Ozempic  with diabetes as the primary indication with adequate clinical response but slowing of weight loss.  Assessment and Plan   Treatment Plan For Obesity:  Recommended Dietary Goals  Kehinde is currently in the action stage of change. As such, her goal is to continue weight management plan. She has agreed to: continue current plan  Behavioral Health and Counseling  We discussed the following behavioral modification strategies today: continue to work on maintaining a reduced calorie state, getting the recommended amount of protein, incorporating whole foods, making healthy choices, staying well hydrated and practicing mindfulness when eating..  Additional education and resources provided today: None  Recommended Physical Activity Goals  Meghan Wilson has been advised to work up to 150 minutes of moderate intensity aerobic activity a week and strengthening exercises 2-3 times per week for cardiovascular health, weight loss maintenance and preservation of muscle mass.   She has agreed to :  Think about enjoyable ways to increase daily physical activity and overcoming barriers to exercise and Increase physical activity in their day and reduce sedentary time (increase NEAT).  Medical Interventions and Pharmacotherapy  We discussed various medication options to help Brithney with her weight loss efforts and  we both agreed to : We will switch from Ozempic  to Mounjaro  due to slowing of her weight loss likely due to tachyphylaxis to semaglutide  and limited dosing.  With Mounjaro  she may be able to continue with her weight loss and maintain glycemic  control.  Associated Conditions Impacted by Obesity Treatment  Assessment & Plan Type 2 diabetes mellitus with obesity (HCC) Her A1c is 5.5 and improved from 6.5.  She is currently on semaglutide  2 mg a week .  Her weight loss has reached a plateau she may benefit from switching to Mounjaro  if covered by her insurance as it is more effective for weight management.  She will be started on Mounjaro  5 mg once a week.  We demonstrated use of pen device OSA on CPAP On CPAP with reported good compliance. Continue PAP therapy. Losing 15% or more of body weight may improve AHI.    Pure hypercholesterolemia LDL is at goal.  On high intensity statin therapy with atorvastatin  80 mg a day.   Her 10 year risk is: The 10-year ASCVD risk score (Arnett DK, et al., 2019) is: 15.8%  Lab Results  Component Value Date   CHOL 144 09/04/2022   HDL 70 09/04/2022   LDLCALC 63 09/04/2022   TRIG 52 09/04/2022   CHOLHDL 2.7 05/02/2021    Continue high intensity statin therapy no adverse effects reported.  Continue weight loss therapy and reduce saturated fats in diet to less than 10% of daily calories.      Obesity: Initial BMI 35 Obesity and Type 2 Diabetes Mellitus with significant weight loss. Current weight is 167 lbs, down from 207 lbs, indicating a 15% total body weight loss. BMI has decreased from 34 to 30. Body fat percentage is 44%, with a target of 36%. Currently on Ozempic  2 mg, but reports decreased efficacy in appetite control and fullness. Considering switch to Mounjaro  for enhanced weight management and diabetes control. Mounjaro  may offer additional benefits for kidney protection and overall metabolic health. Insurance coverage for Mounjaro  is pending. - Switch from Ozempic  to Mounjaro , starting at 5 mg once weekly, pending insurance approval. - Monitor weight and adjust Mounjaro  dosage monthly as needed. - Adhere to a 1000 calorie nutrition plan with adequate protein intake. - Advise against  skipping meals to ensure adequate protein intake. - Continue current exercise regimen of 90 minutes, three times a week, including strength training and cardio. - Schedule follow-up in one month to assess response to Mounjaro  and adjust treatment plan as necessary.  BMI 30.0-30.9,adult            Objective   Physical Exam:  Blood pressure 136/77, pulse 94, temperature 98.2 F (36.8 C), height 5' 2 (1.575 m), weight 167 lb (75.8 kg), SpO2 99%. Body mass index is 30.54 kg/m.  General: She is overweight, cooperative, alert, well developed, and in no acute distress. PSYCH: Has normal mood, affect and thought process.   HEENT: EOMI, sclerae are anicteric. Lungs: Normal breathing effort, no conversational dyspnea. Extremities: No edema.  Neurologic: No gross sensory or motor deficits. No tremors or fasciculations noted.    Diagnostic Data Reviewed:  BMET    Component Value Date/Time   NA 141 12/09/2023 0842   NA 138 03/11/2023 0845   K 4.2 12/09/2023 0842   CL 104 12/09/2023 0842   CO2 30 12/09/2023 0842   GLUCOSE 93 12/09/2023 0842   BUN 9 12/09/2023 0842   BUN 9 03/11/2023 0845   CREATININE 0.76 12/09/2023 9157  CALCIUM  9.6 12/09/2023 0842   GFRNONAA >60 12/09/2023 0842   GFRAA 119 05/11/2020 0935   Lab Results  Component Value Date   HGBA1C 5.7 (A) 01/11/2024   HGBA1C 6.5 (H) 04/02/2018   Lab Results  Component Value Date   INSULIN  6.1 03/11/2023   INSULIN  5.8 11/21/2021   Lab Results  Component Value Date   TSH 0.650 01/11/2024   CBC    Component Value Date/Time   WBC 5.8 12/09/2023 0842   WBC 5.8 10/09/2023 0443   RBC 4.76 12/09/2023 0842   HGB 12.6 12/09/2023 0842   HGB 11.9 06/25/2022 1420   HCT 40.3 12/09/2023 0842   HCT 38.5 06/25/2022 1420   PLT 345 12/09/2023 0842   PLT 398 06/25/2022 1420   MCV 84.7 12/09/2023 0842   MCV 81 06/25/2022 1420   MCH 26.5 12/09/2023 0842   MCHC 31.3 12/09/2023 0842   RDW 14.8 12/09/2023 0842   RDW 13.5  06/25/2022 1420   Iron Studies    Component Value Date/Time   IRON 75 12/09/2023 0842   IRON 24 (L) 06/25/2022 1420   TIBC 386 12/09/2023 0842   TIBC 351 06/25/2022 1420   FERRITIN 166 12/09/2023 0843   FERRITIN 81 06/25/2022 1420   IRONPCTSAT 19 12/09/2023 0842   IRONPCTSAT 7 (LL) 06/25/2022 1420   Lipid Panel     Component Value Date/Time   CHOL 144 09/04/2022 1027   TRIG 52 09/04/2022 1027   HDL 70 09/04/2022 1027   CHOLHDL 2.7 05/02/2021 1158   CHOLHDL 2.9 04/02/2018 1115   VLDL 51 (H) 04/02/2018 1115   LDLCALC 63 09/04/2022 1027   Hepatic Function Panel     Component Value Date/Time   PROT 7.3 12/09/2023 0842   PROT 6.5 03/11/2023 0845   ALBUMIN  4.4 12/09/2023 0842   ALBUMIN  4.4 03/11/2023 0845   AST 15 12/09/2023 0842   ALT 17 12/09/2023 0842   ALKPHOS 73 12/09/2023 0842   BILITOT 0.4 12/09/2023 0842      Component Value Date/Time   TSH 0.650 01/11/2024 1100   Nutritional Lab Results  Component Value Date   VD25OH 78.3 03/11/2023   VD25OH 54.3 09/04/2022   VD25OH 34.2 11/21/2021    Medications: Outpatient Encounter Medications as of 04/04/2024  Medication Sig Note   tirzepatide  (MOUNJARO ) 5 MG/0.5ML Pen Inject 5 mg into the skin once a week.    Accu-Chek FastClix Lancets MISC Use to check blood sugar up to 7 times a week    atorvastatin  (LIPITOR) 80 MG tablet Take 1 tablet (80 mg total) by mouth daily.    bacitracin -polymyxin b  (POLYSPORIN ) ophthalmic ointment Apply a small amount into the right eye 3 (three) times daily.    Capsaicin  0.035 % CREA Apply 1 application  topically 2 (two) times daily as needed.    cycloSPORINE  (RESTASIS ) 0.05 % ophthalmic emulsion Place 1 drop into both eyes 2 (two) times daily.    doxepin  (SINEQUAN ) 10 MG capsule Take 1 capsule (10 mg total) by mouth at bedtime as needed for sleep.    erythromycin  ophthalmic ointment Apply a small amount into the right eye 3 (three) times daily.    ferrous sulfate  325 (65 FE) MG tablet  Take 325 mg by mouth daily with breakfast.    fluorometholone  (FML) 0.1 % ophthalmic suspension Place 1 drop into the right eye 4 (four) times daily.    fluticasone  (FLONASE ) 50 MCG/ACT nasal spray Place 1 spray into both nostrils daily as needed for allergies  or rhinitis    glucose blood test strip USE TO CHECK BLOOD GLUCOSE UP TO 7 TIMES A WEEK.    Glycerin -Hypromellose-PEG 400 (DRY EYE RELIEF DROPS) 0.2-0.2-1 % SOLN Place 1 drop into both eyes in the morning and at bedtime.    lamoTRIgine  (LAMICTAL ) 100 MG tablet Take 1 tablet (100 mg total) by mouth daily.    linaclotide  (LINZESS ) 145 MCG CAPS capsule Take 1 capsule (145 mcg total) by mouth daily before breakfast. Please schedule a yearly follow up for further refills. Thank you    Multiple Vitamins-Minerals (MULTIVITAMIN WITH MINERALS) tablet Take 1 tablet by mouth daily.    ondansetron  (ZOFRAN ) 4 MG tablet Take 1 tablet (4 mg total) by mouth every 6 (six) hours as needed for nausea.    polyethylene glycol (MIRALAX  / GLYCOLAX ) 17 g packet Mix 1 packet (17 g total) in 8 oz clear liquid and drink by mouth daily as needed for mild constipation.    prazosin  (MINIPRESS ) 1 MG capsule Take 1 capsule by mouth at bedtime    triamcinolone  cream (KENALOG ) 0.1 % Apply 1 Application topically daily. Apply underneath breast as needed    triamcinolone  ointment (KENALOG ) 0.5 % Apply 1 Application topically to hands daily as needed.    [DISCONTINUED] doxepin  (SINEQUAN ) 10 MG capsule Take 1 capsule (10 mg total) by mouth at bedtime as needed for sleep    [DISCONTINUED] doxepin  (SINEQUAN ) 10 MG capsule Take 1 capsule (10 mg total) by mouth at bedtime.    [DISCONTINUED] gabapentin  (NEURONTIN ) 100 MG capsule Take 1 capsule (100 mg total) by mouth 3 (three) times daily.    [DISCONTINUED] lamoTRIgine  (LAMICTAL ) 100 MG tablet Take 1 tablet (100 mg total) by mouth daily.    [DISCONTINUED] lamoTRIgine  (LAMICTAL ) 100 MG tablet Take 1 tablet by mouth once a day 04/04/2024:  CLEANING LIST DUPLICATES   [DISCONTINUED] meloxicam  (MOBIC ) 7.5 MG tablet Take 1 tablet (7.5 mg total) by mouth daily with meals    [DISCONTINUED] prazosin  (MINIPRESS ) 1 MG capsule Take 1 capsule by mouth at bedtime 04/04/2024: DUPLICATE   [DISCONTINUED] Semaglutide , 2 MG/DOSE, 8 MG/3ML SOPN Inject 2 mg into the skin once a week. 04/04/2024: tachyphylaxis   No facility-administered encounter medications on file as of 04/04/2024.     Follow-Up   Return in about 4 weeks (around 05/02/2024) for For Weight Mangement with Dr. Francyne.SABRA She was informed of the importance of frequent follow up visits to maximize her success with intensive lifestyle modifications for her multiple health conditions.  Attestation Statement   Reviewed by clinician on day of visit: allergies, medications, problem list, medical history, surgical history, family history, social history, and previous encounter notes.     Lucas Francyne, MD

## 2024-04-04 NOTE — Assessment & Plan Note (Signed)
 Obesity and Type 2 Diabetes Mellitus with significant weight loss. Current weight is 167 lbs, down from 207 lbs, indicating a 15% total body weight loss. BMI has decreased from 34 to 30. Body fat percentage is 44%, with a target of 36%. Currently on Ozempic  2 mg, but reports decreased efficacy in appetite control and fullness. Considering switch to Mounjaro  for enhanced weight management and diabetes control. Mounjaro  may offer additional benefits for kidney protection and overall metabolic health. Insurance coverage for Mounjaro  is pending. - Switch from Ozempic  to Mounjaro , starting at 5 mg once weekly, pending insurance approval. - Monitor weight and adjust Mounjaro  dosage monthly as needed. - Adhere to a 1000 calorie nutrition plan with adequate protein intake. - Advise against skipping meals to ensure adequate protein intake. - Continue current exercise regimen of 90 minutes, three times a week, including strength training and cardio. - Schedule follow-up in one month to assess response to Mounjaro  and adjust treatment plan as necessary.

## 2024-04-04 NOTE — Assessment & Plan Note (Signed)
 Her A1c is 5.5 and improved from 6.5.  She is currently on semaglutide  2 mg a week .  Her weight loss has reached a plateau she may benefit from switching to Mounjaro  if covered by her insurance as it is more effective for weight management.  She will be started on Mounjaro  5 mg once a week.  We demonstrated use of pen device

## 2024-04-04 NOTE — Assessment & Plan Note (Signed)
 LDL is at goal.  On high intensity statin therapy with atorvastatin  80 mg a day.   Her 10 year risk is: The 10-year ASCVD risk score (Arnett DK, et al., 2019) is: 15.8%  Lab Results  Component Value Date   CHOL 144 09/04/2022   HDL 70 09/04/2022   LDLCALC 63 09/04/2022   TRIG 52 09/04/2022   CHOLHDL 2.7 05/02/2021    Continue high intensity statin therapy no adverse effects reported.  Continue weight loss therapy and reduce saturated fats in diet to less than 10% of daily calories.

## 2024-04-05 ENCOUNTER — Other Ambulatory Visit: Payer: Self-pay | Admitting: Student

## 2024-04-05 ENCOUNTER — Other Ambulatory Visit (HOSPITAL_COMMUNITY): Payer: Self-pay

## 2024-04-06 ENCOUNTER — Other Ambulatory Visit: Payer: Self-pay

## 2024-04-06 ENCOUNTER — Other Ambulatory Visit (HOSPITAL_COMMUNITY): Payer: Self-pay

## 2024-04-06 ENCOUNTER — Telehealth: Payer: Self-pay | Admitting: Gastroenterology

## 2024-04-06 MED ORDER — TRIAMCINOLONE ACETONIDE 0.1 % EX CREA
1.0000 | TOPICAL_CREAM | Freq: Every day | CUTANEOUS | 2 refills | Status: AC
  Filled 2024-04-06: qty 45, 30d supply, fill #0
  Filled 2024-04-18 – 2024-07-20 (×2): qty 45, 30d supply, fill #1
  Filled 2024-08-28: qty 45, 30d supply, fill #2

## 2024-04-06 NOTE — Telephone Encounter (Signed)
 The patient is calling to have a refill on Linzess . She has scheduled an OV for 10/29 with Dr. Leigh. Prescription should be sent to [facility]

## 2024-04-07 ENCOUNTER — Other Ambulatory Visit (HOSPITAL_COMMUNITY): Payer: Self-pay

## 2024-04-07 MED ORDER — LINACLOTIDE 145 MCG PO CAPS
145.0000 ug | ORAL_CAPSULE | Freq: Every day | ORAL | 2 refills | Status: DC
  Filled 2024-04-07: qty 30, 30d supply, fill #0
  Filled 2024-04-18 – 2024-05-17 (×2): qty 30, 30d supply, fill #1

## 2024-04-07 NOTE — Telephone Encounter (Signed)
 Refills sent to get to Oct appointment

## 2024-04-08 ENCOUNTER — Other Ambulatory Visit: Payer: Self-pay

## 2024-04-08 DIAGNOSIS — D509 Iron deficiency anemia, unspecified: Secondary | ICD-10-CM

## 2024-04-08 DIAGNOSIS — D508 Other iron deficiency anemias: Secondary | ICD-10-CM

## 2024-04-08 DIAGNOSIS — D649 Anemia, unspecified: Secondary | ICD-10-CM

## 2024-04-09 ENCOUNTER — Other Ambulatory Visit: Payer: Self-pay

## 2024-04-10 NOTE — Progress Notes (Deleted)
 Patient Care Team: Marylu Gee, DO as PCP - General (Internal Medicine) Lanny Callander, MD as Attending Physician (Hematology and Oncology) Burton, Lacie K, NP as Nurse Practitioner (Hematology and Oncology)  Clinic Day:  04/10/2024  Referring physician: Fernand Prost, MD  ASSESSMENT & PLAN:   Assessment & Plan: Anemia She has no prior history of anemia until Ortho surgery in 09/2021, anemia resolved after surgery, then recurrent more severe anemia with second surgery in 10/2021 Hgb 7.9 -She started oral iron p.o. once daily 10/21/2021, tolerating well with mild constipation managed with OTC meds -Past GI work-up 12/2020 showed duodenitis and H. Pylori+ which was treated.  No obvious bleeding source on upper endoscopy or colonoscopy (Dr. Leigh) -She does not currently have menstrual periods -Recent folic acid, B12, thyroid panel, and iron panel were normal -Her mild anemia is likely related to orthopedic operations and is recovering.   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   -She received 1 dose IV Feraheme in 07/2022 and continues oral iron, tolerated well. IDA resolved with IV and oral iron -Stopped oral iron in 10/2022 -12/09/2023 - CBC and CMP within normal limits. Ferritin and iron panels are pending. Will treat with IV iron if ferritin level is < 100. Will be scheduled at [facility]. Patient is aware and agreeable to the plan.     The patient understands the plans discussed today and is in agreement with them.  She knows to contact our office if she develops concerns prior to her next appointment.  I provided *** minutes of face-to-face time during this encounter and > 50% was spent counseling as documented under my assessment and plan.    Powell FORBES Lessen, NP  [facility] - A DEPT OF [facility] [address] [place] [address] Dept:  [phone] Dept Fax: [phone]   No orders of the defined types were placed in this encounter.     CHIEF COMPLAINT:  CC: Iron deficiency anemia  Current Treatment: IV Feraheme as needed.  Goal for ferritin >100.  INTERVAL HISTORY:  Meghan Wilson is here today for repeat clinical assessment.  She was last seen by me on 12/09/2023.  She denies fevers or chills. She denies pain. Her appetite is good. Her weight {Weight change:10426}.  I have reviewed the past medical history, past surgical history, social history and family history with the patient and they are unchanged from previous note.  ALLERGIES:  is allergic to aspirin , depakote [divalproex sodium], ibuprofen, prednisone , and latex.  MEDICATIONS:  Current Outpatient Medications  Medication Sig Dispense Refill   Accu-Chek FastClix Lancets MISC Use to check blood sugar up to 7 times a week 102 each 3   atorvastatin  (LIPITOR) 80 MG tablet Take 1 tablet (80 mg total) by mouth daily. 30 tablet 11   bacitracin -polymyxin b  (POLYSPORIN ) ophthalmic ointment Apply a small amount into the right eye 3 (three) times daily. 3.5 g 0   Capsaicin  0.035 % CREA Apply 1 application  topically 2 (two) times daily as needed. 42.5 g 0   cycloSPORINE  (RESTASIS ) 0.05 % ophthalmic emulsion Place 1 drop into both eyes 2 (two) times daily. 180 each 3   doxepin  (SINEQUAN ) 10 MG capsule Take 1 capsule (10 mg total) by mouth at bedtime as needed for sleep. 30 capsule 2   erythromycin  ophthalmic ointment Apply a small amount into the right eye 3 (three) times  daily. 3.5 g 0   ferrous sulfate  325 (65 FE) MG tablet Take 325 mg by mouth daily with breakfast.     fluorometholone  (FML) 0.1 % ophthalmic suspension Place 1 drop into the right eye 4 (four) times daily. 5 mL 0   fluticasone  (FLONASE ) 50 MCG/ACT nasal spray Place 1 spray into both nostrils daily as needed for allergies or rhinitis 16 g 2   glucose blood test strip USE TO CHECK BLOOD GLUCOSE UP TO 7 TIMES A  WEEK. 100 strip 3   Glycerin -Hypromellose-PEG 400 (DRY EYE RELIEF DROPS) 0.2-0.2-1 % SOLN Place 1 drop into both eyes in the morning and at bedtime.     lamoTRIgine  (LAMICTAL ) 100 MG tablet Take 1 tablet (100 mg total) by mouth daily. 30 tablet 2   linaclotide  (LINZESS ) 145 MCG CAPS capsule Take 1 capsule (145 mcg total) by mouth daily before breakfast. Please keep your October appointment with Dr. Leigh for any further refills. Thank you 30 capsule 2   Multiple Vitamins-Minerals (MULTIVITAMIN WITH MINERALS) tablet Take 1 tablet by mouth daily.     ondansetron  (ZOFRAN ) 4 MG tablet Take 1 tablet (4 mg total) by mouth every 6 (six) hours as needed for nausea. 20 tablet 0   polyethylene glycol (MIRALAX  / GLYCOLAX ) 17 g packet Mix 1 packet (17 g total) in 8 oz clear liquid and drink by mouth daily as needed for mild constipation. 14 each 0   prazosin  (MINIPRESS ) 1 MG capsule Take 1 capsule by mouth at bedtime 30 capsule 2   tirzepatide  (MOUNJARO ) 5 MG/0.5ML Pen Inject 5 mg into the skin once a week. 2 mL 0   triamcinolone  cream (KENALOG ) 0.1 % Apply 1 Application topically underneath breast daily as needed. 45 g 2   triamcinolone  ointment (KENALOG ) 0.5 % Apply 1 Application topically to hands daily as needed. 30 g 2   No current facility-administered medications for this visit.    HISTORY OF PRESENT ILLNESS:   Oncology History   No history exists.      REVIEW OF SYSTEMS:   Constitutional: Denies fevers, chills or abnormal weight loss Eyes: Denies blurriness of vision Ears, nose, mouth, throat, and face: Denies mucositis or sore throat Respiratory: Denies cough, dyspnea or wheezes Cardiovascular: Denies palpitation, chest discomfort or lower extremity swelling Gastrointestinal:  Denies nausea, heartburn or change in bowel habits Skin: Denies abnormal skin rashes Lymphatics: Denies new lymphadenopathy or easy bruising Neurological:Denies numbness, tingling or new  weaknesses Behavioral/Psych: Mood is stable, no new changes  All other systems were reviewed with the patient and are negative.   VITALS:  There were no vitals taken for this visit.  Wt Readings from Last 3 Encounters:  04/04/24 167 lb (75.8 kg)  03/01/24 172 lb (78 kg)  02/10/24 176 lb (79.8 kg)    There is no height or weight on file to calculate BMI.  Performance status (ECOG): {CHL ONC [id number]  PHYSICAL EXAM:   GENERAL:alert, no distress and comfortable SKIN: skin color, texture, turgor are normal, no rashes or significant lesions EYES: normal, Conjunctiva are pink and non-injected, sclera clear OROPHARYNX:no exudate, no erythema and lips, buccal mucosa, and tongue normal  NECK: supple, thyroid normal size, non-tender, without nodularity LYMPH:  no palpable lymphadenopathy in the cervical, axillary or inguinal LUNGS: clear to auscultation and percussion with normal breathing effort HEART: regular rate & rhythm and no murmurs and no lower extremity edema ABDOMEN:abdomen soft, non-tender and normal bowel sounds Musculoskeletal:no cyanosis of digits and no clubbing  NEURO: alert & oriented x 3 with fluent speech, no focal motor/sensory deficits  LABORATORY DATA:  I have reviewed the data as listed    Component Value Date/Time   NA 141 12/09/2023 0842   NA 138 03/11/2023 0845   K 4.2 12/09/2023 0842   CL 104 12/09/2023 0842   CO2 30 12/09/2023 0842   GLUCOSE 93 12/09/2023 0842   BUN 9 12/09/2023 0842   BUN 9 03/11/2023 0845   CREATININE 0.76 12/09/2023 0842   CALCIUM  9.6 12/09/2023 0842   PROT 7.3 12/09/2023 0842   PROT 6.5 03/11/2023 0845   ALBUMIN  4.4 12/09/2023 0842   ALBUMIN  4.4 03/11/2023 0845   AST 15 12/09/2023 0842   ALT 17 12/09/2023 0842   ALKPHOS 73 12/09/2023 0842   BILITOT 0.4 12/09/2023 0842   GFRNONAA >60 12/09/2023 0842   GFRAA 119 05/11/2020 0935    No results found for: SPEP, UPEP  Lab Results  Component Value Date   WBC 5.8  12/09/2023   NEUTROABS 3.1 12/09/2023   HGB 12.6 12/09/2023   HCT 40.3 12/09/2023   MCV 84.7 12/09/2023   PLT 345 12/09/2023      Chemistry      Component Value Date/Time   NA 141 12/09/2023 0842   NA 138 03/11/2023 0845   K 4.2 12/09/2023 0842   CL 104 12/09/2023 0842   CO2 30 12/09/2023 0842   BUN 9 12/09/2023 0842   BUN 9 03/11/2023 0845   CREATININE 0.76 12/09/2023 0842      Component Value Date/Time   CALCIUM  9.6 12/09/2023 0842   ALKPHOS 73 12/09/2023 0842   AST 15 12/09/2023 0842   ALT 17 12/09/2023 0842   BILITOT 0.4 12/09/2023 0842       RADIOGRAPHIC STUDIES: I have personally reviewed the radiological images as listed and agreed with the findings in the report. No results found.

## 2024-04-10 NOTE — Assessment & Plan Note (Deleted)
 She has no prior history of anemia until Ortho surgery in 09/2021, anemia resolved after surgery, then recurrent more severe anemia with second surgery in 10/2021 Hgb 7.9 -She started oral iron p.o. once daily 10/21/2021, tolerating well with mild constipation managed with OTC meds -Past GI work-up 12/2020 showed duodenitis and H. Pylori+ which was treated.  No obvious bleeding source on upper endoscopy or colonoscopy (Dr. Leigh) -She does not currently have menstrual periods -Recent folic acid, B12, thyroid panel, and iron panel were normal -Her mild anemia is likely related to orthopedic operations and is recovering.   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   -She received 1 dose IV Feraheme in 07/2022 and continues oral iron, tolerated well. IDA resolved with IV and oral iron -Stopped oral iron in 10/2022 -12/09/2023 - CBC and CMP within normal limits. Ferritin and iron panels are pending. Will treat with IV iron if ferritin level is < 100. Will be scheduled at [facility]. Patient is aware and agreeable to the plan.

## 2024-04-11 ENCOUNTER — Inpatient Hospital Stay: Payer: MEDICAID | Admitting: Nurse Practitioner

## 2024-04-11 ENCOUNTER — Other Ambulatory Visit: Payer: Self-pay

## 2024-04-11 ENCOUNTER — Inpatient Hospital Stay: Payer: MEDICAID

## 2024-04-11 ENCOUNTER — Telehealth: Payer: Self-pay

## 2024-04-11 DIAGNOSIS — D509 Iron deficiency anemia, unspecified: Secondary | ICD-10-CM

## 2024-04-11 NOTE — Telephone Encounter (Signed)
 Called patient due to not showing up for her 900 am app in lab and 930 app with heather bosica np. Patient stated she didn't know she had an appointment, I transferred the patient to scheduling to get her rescheduled.

## 2024-04-12 NOTE — Progress Notes (Deleted)
 Patient Care Team: Marylu Gee, DO as PCP - General (Internal Medicine) Lanny Callander, MD as Attending Physician (Hematology and Oncology) Burton, Lacie K, NP as Nurse Practitioner (Hematology and Oncology)  Clinic Day:  04/12/2024  Referring physician: Fernand Prost, MD  ASSESSMENT & PLAN:   Assessment & Plan: No problem-specific Assessment & Plan notes found for this encounter.     The patient understands the plans discussed today and is in agreement with them.  She knows to contact our office if she develops concerns prior to her next appointment.  I provided *** minutes of face-to-face time during this encounter and > 50% was spent counseling as documented under my assessment and plan.    Powell FORBES Lessen, NP  [facility] - A DEPT OF [facility] [address] [place] [address] Dept: [phone] Dept Fax: [phone]   No orders of the defined types were placed in this encounter.     CHIEF COMPLAINT:  CC: Iron deficiency anemia  Current Treatment: IV Feraheme as needed.  Goal for ferritin >100.  INTERVAL HISTORY:  Meghan Wilson is here today for repeat clinical assessment.  She was last seen by me on 12/09/2023.  She denies fevers or chills. She denies pain. Her appetite is good. Her weight {Weight change:10426}.  I have reviewed the past medical history, past surgical history, social history and family history with the patient and they are unchanged from previous note.  ALLERGIES:  is allergic to aspirin , depakote [divalproex sodium], ibuprofen, prednisone , and latex.  MEDICATIONS:  Current Outpatient Medications  Medication Sig Dispense Refill   Accu-Chek FastClix Lancets MISC Use to check blood sugar up to 7 times a week 102 each 3   atorvastatin  (LIPITOR) 80 MG tablet Take 1 tablet (80 mg total) by mouth daily. 30 tablet 11   bacitracin -polymyxin b  (POLYSPORIN ) ophthalmic ointment Apply a small  amount into the right eye 3 (three) times daily. 3.5 g 0   Capsaicin  0.035 % CREA Apply 1 application  topically 2 (two) times daily as needed. 42.5 g 0   cycloSPORINE  (RESTASIS ) 0.05 % ophthalmic emulsion Place 1 drop into both eyes 2 (two) times daily. 180 each 3   doxepin  (SINEQUAN ) 10 MG capsule Take 1 capsule (10 mg total) by mouth at bedtime as needed for sleep. 30 capsule 2   erythromycin  ophthalmic ointment Apply a small amount into the right eye 3 (three) times daily. 3.5 g 0   ferrous sulfate  325 (65 FE) MG tablet Take 325 mg by mouth daily with breakfast.     fluorometholone  (FML) 0.1 % ophthalmic suspension Place 1 drop into the right eye 4 (four) times daily. 5 mL 0   fluticasone  (FLONASE ) 50 MCG/ACT nasal spray Place 1 spray into both nostrils daily as needed for allergies or rhinitis 16 g 2   glucose blood test strip USE TO CHECK BLOOD GLUCOSE UP TO 7 TIMES A WEEK. 100 strip 3   Glycerin -Hypromellose-PEG 400 (DRY EYE RELIEF DROPS) 0.2-0.2-1 % SOLN Place 1 drop into both eyes in the morning and at bedtime.     lamoTRIgine  (LAMICTAL ) 100 MG tablet Take 1 tablet (100 mg total) by mouth daily. 30 tablet 2   linaclotide  (LINZESS ) 145 MCG CAPS capsule Take 1 capsule (145 mcg total) by mouth daily before breakfast. Please keep your October appointment with Dr. Leigh for any further refills. Thank you 30 capsule 2   Multiple Vitamins-Minerals (MULTIVITAMIN WITH  MINERALS) tablet Take 1 tablet by mouth daily.     ondansetron  (ZOFRAN ) 4 MG tablet Take 1 tablet (4 mg total) by mouth every 6 (six) hours as needed for nausea. 20 tablet 0   polyethylene glycol (MIRALAX  / GLYCOLAX ) 17 g packet Mix 1 packet (17 g total) in 8 oz clear liquid and drink by mouth daily as needed for mild constipation. 14 each 0   prazosin  (MINIPRESS ) 1 MG capsule Take 1 capsule by mouth at bedtime 30 capsule 2   tirzepatide  (MOUNJARO ) 5 MG/0.5ML Pen Inject 5 mg into the skin once a week. 2 mL 0   triamcinolone  cream  (KENALOG ) 0.1 % Apply 1 Application topically underneath breast daily as needed. 45 g 2   triamcinolone  ointment (KENALOG ) 0.5 % Apply 1 Application topically to hands daily as needed. 30 g 2   No current facility-administered medications for this visit.       REVIEW OF SYSTEMS:   Constitutional: Denies fevers, chills or abnormal weight loss Eyes: Denies blurriness of vision Ears, nose, mouth, throat, and face: Denies mucositis or sore throat Respiratory: Denies cough, dyspnea or wheezes Cardiovascular: Denies palpitation, chest discomfort or lower extremity swelling Gastrointestinal:  Denies nausea, heartburn or change in bowel habits Skin: Denies abnormal skin rashes Lymphatics: Denies new lymphadenopathy or easy bruising Neurological:Denies numbness, tingling or new weaknesses Behavioral/Psych: Mood is stable, no new changes  All other systems were reviewed with the patient and are negative.   VITALS:  There were no vitals taken for this visit.  Wt Readings from Last 3 Encounters:  04/04/24 167 lb (75.8 kg)  03/01/24 172 lb (78 kg)  02/10/24 176 lb (79.8 kg)    There is no height or weight on file to calculate BMI.  Performance status (ECOG): {CHL ONC [id number]  PHYSICAL EXAM:   GENERAL:alert, no distress and comfortable SKIN: skin color, texture, turgor are normal, no rashes or significant lesions EYES: normal, Conjunctiva are pink and non-injected, sclera clear OROPHARYNX:no exudate, no erythema and lips, buccal mucosa, and tongue normal  NECK: supple, thyroid normal size, non-tender, without nodularity LYMPH:  no palpable lymphadenopathy in the cervical, axillary or inguinal LUNGS: clear to auscultation and percussion with normal breathing effort HEART: regular rate & rhythm and no murmurs and no lower extremity edema ABDOMEN:abdomen soft, non-tender and normal bowel sounds Musculoskeletal:no cyanosis of digits and no clubbing  NEURO: alert & oriented x 3 with  fluent speech, no focal motor/sensory deficits  LABORATORY DATA:  I have reviewed the data as listed    Component Value Date/Time   NA 141 12/09/2023 0842   NA 138 03/11/2023 0845   K 4.2 12/09/2023 0842   CL 104 12/09/2023 0842   CO2 30 12/09/2023 0842   GLUCOSE 93 12/09/2023 0842   BUN 9 12/09/2023 0842   BUN 9 03/11/2023 0845   CREATININE 0.76 12/09/2023 0842   CALCIUM  9.6 12/09/2023 0842   PROT 7.3 12/09/2023 0842   PROT 6.5 03/11/2023 0845   ALBUMIN  4.4 12/09/2023 0842   ALBUMIN  4.4 03/11/2023 0845   AST 15 12/09/2023 0842   ALT 17 12/09/2023 0842   ALKPHOS 73 12/09/2023 0842   BILITOT 0.4 12/09/2023 0842   GFRNONAA >60 12/09/2023 0842   GFRAA 119 05/11/2020 0935    No results found for: SPEP, UPEP  Lab Results  Component Value Date   WBC 5.8 12/09/2023   NEUTROABS 3.1 12/09/2023   HGB 12.6 12/09/2023   HCT 40.3 12/09/2023   MCV 84.7  12/09/2023   PLT 345 12/09/2023      Chemistry      Component Value Date/Time   NA 141 12/09/2023 0842   NA 138 03/11/2023 0845   K 4.2 12/09/2023 0842   CL 104 12/09/2023 0842   CO2 30 12/09/2023 0842   BUN 9 12/09/2023 0842   BUN 9 03/11/2023 0845   CREATININE 0.76 12/09/2023 0842      Component Value Date/Time   CALCIUM  9.6 12/09/2023 0842   ALKPHOS 73 12/09/2023 0842   AST 15 12/09/2023 0842   ALT 17 12/09/2023 0842   BILITOT 0.4 12/09/2023 0842       RADIOGRAPHIC STUDIES: I have personally reviewed the radiological images as listed and agreed with the findings in the report. No results found.

## 2024-04-12 NOTE — Assessment & Plan Note (Deleted)
 She has no prior history of anemia until Ortho surgery in 09/2021, anemia resolved after surgery, then recurrent more severe anemia with second surgery in 10/2021 Hgb 7.9 -She started oral iron p.o. once daily 10/21/2021, tolerating well with mild constipation managed with OTC meds -Past GI work-up 12/2020 showed duodenitis and H. Pylori+ which was treated.  No obvious bleeding source on upper endoscopy or colonoscopy (Dr. Leigh) -She does not currently have menstrual periods -Recent folic acid, B12, thyroid panel, and iron panel were normal -Her mild anemia is likely related to orthopedic operations and is recovering.   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   -She received 1 dose IV Feraheme in 07/2022 and continues oral iron, tolerated well. IDA resolved with IV and oral iron -Stopped oral iron in 10/2022 -12/09/2023 - CBC and CMP within normal limits. Ferritin and iron panels are pending. Will treat with IV iron if ferritin level is < 100. Will be scheduled at [facility]. Patient is aware and agreeable to the plan.

## 2024-04-13 ENCOUNTER — Inpatient Hospital Stay: Admitting: Nurse Practitioner

## 2024-04-13 ENCOUNTER — Other Ambulatory Visit (HOSPITAL_COMMUNITY): Payer: Self-pay

## 2024-04-13 ENCOUNTER — Telehealth: Payer: Self-pay | Admitting: Nurse Practitioner

## 2024-04-13 ENCOUNTER — Inpatient Hospital Stay

## 2024-04-13 DIAGNOSIS — D509 Iron deficiency anemia, unspecified: Secondary | ICD-10-CM

## 2024-04-13 NOTE — Telephone Encounter (Signed)
 Rescheduled appointments per room/resource. Patient was brought to scheduling and her appointments were changed. Patient is aware.

## 2024-04-18 ENCOUNTER — Telehealth: Payer: Self-pay | Admitting: Student

## 2024-04-18 ENCOUNTER — Other Ambulatory Visit: Payer: Self-pay

## 2024-04-18 ENCOUNTER — Other Ambulatory Visit (HOSPITAL_COMMUNITY): Payer: Self-pay

## 2024-04-18 ENCOUNTER — Ambulatory Visit: Payer: MEDICAID | Admitting: Student

## 2024-04-18 MED ORDER — CYCLOSPORINE 0.05 % OP EMUL
1.0000 [drp] | Freq: Two times a day (BID) | OPHTHALMIC | 3 refills | Status: AC
  Filled 2024-04-18: qty 180, 90d supply, fill #0

## 2024-04-18 NOTE — Telephone Encounter (Signed)
 Patient was contact via telephone to reschedule her missed appointment today at our office, but no answer.  Left detailed message to call back at her earliest convenience to reschedule.

## 2024-04-19 NOTE — Assessment & Plan Note (Signed)
 She has no prior history of anemia until Ortho surgery in 09/2021, anemia resolved after surgery, then recurrent more severe anemia with second surgery in 10/2021 Hgb 7.9 -She started oral iron p.o. once daily 10/21/2021, tolerating well with mild constipation managed with OTC meds -Past GI work-up 12/2020 showed duodenitis and H. Pylori+ which was treated.  No obvious bleeding source on upper endoscopy or colonoscopy (Dr. Leigh) -She does not currently have menstrual periods -Recent folic acid, B12, thyroid panel, and iron panel were normal -Her mild anemia is likely related to orthopedic operations and is recovering.   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   -She received 1 dose IV Feraheme in 07/2022 and continues oral iron, tolerated well. IDA resolved with IV and oral iron -Stopped oral iron in 10/2022 -12/09/2023 - CBC and CMP within normal limits. Ferritin and iron panels were also normal.  -04/20/2024 - CBC and iron panels are normal. Consider adding oral iron for ferritin between 100 and 200. Will treat with IV iron if ferritin level is < 100. Will be scheduled at [facility]. Patient is aware and agreeable to the plan.

## 2024-04-19 NOTE — Progress Notes (Unsigned)
 Patient Care Team: Marylu Gee, DO as PCP - General (Internal Medicine) Lanny Callander, MD as Attending Physician (Hematology and Oncology) Burton, Lacie K, NP as Nurse Practitioner (Hematology and Oncology)  Clinic Day:  04/20/2024  Referring physician: Marylu Gee, DO  ASSESSMENT & PLAN:   Assessment & Plan: Anemia She has no prior history of anemia until Ortho surgery in 09/2021, anemia resolved after surgery, then recurrent more severe anemia with second surgery in 10/2021 Hgb 7.9 -She started oral iron p.o. once daily 10/21/2021, tolerating well with mild constipation managed with OTC meds -Past GI work-up 12/2020 showed duodenitis and H. Pylori+ which was treated.  No obvious bleeding source on upper endoscopy or colonoscopy (Dr. Leigh) -She does not currently have menstrual periods -Recent folic acid, B12, thyroid panel, and iron panel were normal -Her mild anemia is likely related to orthopedic operations and is recovering.   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   -She received 1 dose IV Feraheme in 07/2022 and continues oral iron, tolerated well. IDA resolved with IV and oral iron -Stopped oral iron in 10/2022 -12/09/2023 - CBC and CMP within normal limits. Ferritin and iron panels were also normal.  -04/20/2024 - CBC and iron panels are normal. Consider adding oral iron for ferritin between 100 and 200. Will treat with IV iron if ferritin level is < 100. Will be scheduled at [employer]. Patient is aware and agreeable to the plan.    Iron deficiency anemia Previously has been on oral iron.  She has also received IV iron infusions.  Most recent IV iron given in 2023.  Her last  labs on 12/09/2023 showed normal CBC.  Ferritin was 166.  Her iron panel was normal.  Currently, she is not on oral iron.  CBC today appears normal with Hgb 12.6 and HCT 38.9.  Her iron panel is normal.  Awaiting ferritin results.  If ferritin is  in mid 100s, will recommend she restart oral iron, every other day, as she has started to feel fatigued and has had some postural dizziness.  If ferritin <100, will arrange for IV iron therapy.  Plan Labs reviewed. -normal CBC with Hgb 12.6 and HCT 38.9. - Iron panel normal. - Ferritin is pending. Will consider addition of oral iron every other day for ferritin >100 and <200.  IV iron for ferritin <100. Plan for repeat labs and follow-up in 4 months, sooner if needed.  The patient understands the plans discussed today and is in agreement with them.  She knows to contact our office if she develops concerns prior to her next appointment.  I provided 20 minutes of face-to-face time during this encounter and > 50% was spent counseling as documented under my assessment and plan.    Powell FORBES Lessen, NP  [facility] - A DEPT OF [facility] [address] [place] [address] Dept: [phone] Dept Fax: [phone]   No orders of the defined types were placed in this encounter.     CHIEF COMPLAINT:  CC: Iron deficiency anemia  Current Treatment: IV Feraheme as needed  INTERVAL HISTORY:  Meghan Wilson is here today for repeat clinical assessment.  She last saw me on 12/09/2023.  Hgb and HCT were WNL at that time.  Iron panel and ferritin were also normal.  She states over past few months, she has become increasingly fatigued.  She has  had intermittent episodes of postural dizziness.  This is happening mostly with quick position changes.  There is water , she has also noted palpitations.  She denies chest pain, chest pressure, shortness of breath.  She has had mild cough in the spring and summer.  It is productive of clear phlegm.  Denies wheezes.  She denies headaches, she denies abdominal pain, nausea, vomiting, diarrhea, constipation.  She denies unusual bleeding such as blood in her stool,  hematemesis, hemoptysis, or epistaxis.   She denies fevers or chills.  She denies unintentional weight loss.  She denies pain. Her appetite is good. Her weight has increased 8 pounds over last 6 weeks.  I have reviewed the past medical history, past surgical history, social history and family history with the patient and they are unchanged from previous note.  ALLERGIES:  is allergic to aspirin , depakote [divalproex sodium], ibuprofen, prednisone , and latex.  MEDICATIONS:  Current Outpatient Medications  Medication Sig Dispense Refill   Accu-Chek FastClix Lancets MISC Use to check blood sugar up to 7 times a week 102 each 3   atorvastatin  (LIPITOR) 80 MG tablet Take 1 tablet (80 mg total) by mouth daily. 30 tablet 11   bacitracin -polymyxin b  (POLYSPORIN ) ophthalmic ointment Apply a small amount into the right eye 3 (three) times daily. 3.5 g 0   Capsaicin  0.035 % CREA Apply 1 application  topically 2 (two) times daily as needed. 42.5 g 0   cycloSPORINE  (RESTASIS ) 0.05 % ophthalmic emulsion Place 1 drop into both eyes 2 (two) times daily. 180 each 3   cycloSPORINE  (RESTASIS ) 0.05 % ophthalmic emulsion Place 1 drop into both eyes 2 (two) times daily. 180 each 3   doxepin  (SINEQUAN ) 10 MG capsule Take 1 capsule (10 mg total) by mouth at bedtime as needed for sleep. 30 capsule 2   erythromycin  ophthalmic ointment Apply a small amount into the right eye 3 (three) times daily. 3.5 g 0   ferrous sulfate  325 (65 FE) MG tablet Take 325 mg by mouth daily with breakfast.     fluorometholone  (FML) 0.1 % ophthalmic suspension Place 1 drop into the right eye 4 (four) times daily. 5 mL 0   fluticasone  (FLONASE ) 50 MCG/ACT nasal spray Place 1 spray into both nostrils daily as needed for allergies or rhinitis 16 g 2   glucose blood test strip USE TO CHECK BLOOD GLUCOSE UP TO 7 TIMES A WEEK. 100 strip 3   Glycerin -Hypromellose-PEG 400 (DRY EYE RELIEF DROPS) 0.2-0.2-1 % SOLN Place 1 drop into both eyes in the morning and at bedtime.     lamoTRIgine   (LAMICTAL ) 100 MG tablet Take 1 tablet (100 mg total) by mouth daily. 30 tablet 2   linaclotide  (LINZESS ) 145 MCG CAPS capsule Take 1 capsule (145 mcg total) by mouth daily before breakfast. Please keep your October appointment with Dr. Leigh for any further refills. Thank you 30 capsule 2   Multiple Vitamins-Minerals (MULTIVITAMIN WITH MINERALS) tablet Take 1 tablet by mouth daily.     ondansetron  (ZOFRAN ) 4 MG tablet Take 1 tablet (4 mg total) by mouth every 6 (six) hours as needed for nausea. 20 tablet 0   polyethylene glycol (MIRALAX  / GLYCOLAX ) 17 g packet Mix 1 packet (17 g total) in 8 oz clear liquid and drink by mouth daily as needed for mild constipation. 14 each 0   prazosin  (MINIPRESS ) 1 MG capsule Take 1 capsule by mouth at bedtime 30 capsule 2   tirzepatide  (MOUNJARO ) 5 MG/0.5ML Pen Inject 5  mg into the skin once a week. 2 mL 0   triamcinolone  cream (KENALOG ) 0.1 % Apply 1 Application topically underneath breast daily as needed. 45 g 2   triamcinolone  ointment (KENALOG ) 0.5 % Apply 1 Application topically to hands daily as needed. 30 g 2   No current facility-administered medications for this visit.      REVIEW OF SYSTEMS:   Constitutional: Denies fevers, chills or abnormal weight loss. Increased fatigue.  Eyes: Denies blurriness of vision Ears, nose, mouth, throat, and face: Denies mucositis or sore throat Respiratory: mild cough with occasional clear phlem production. Denies wheezes or dyspnea.  Cardiovascular: Denies chest discomfort or lower extremity swelling. She has noted palpitations intermittently  Gastrointestinal:  Denies nausea, heartburn or change in bowel habits Skin: Denies abnormal skin rashes Lymphatics: Denies new lymphadenopathy or easy bruising Neurological:Denies numbness, tingling or new weaknesses. Has had a few episodes of dizziness, especially with quick position changes  Behavioral/Psych: Mood is stable, no new changes  All other systems were  reviewed with the patient and are negative.   VITALS:   Today's Vitals   04/20/24 0926 04/20/24 0935  BP: 117/80   Pulse: 89   Resp: 17   Temp: 98.1 F (36.7 C)   SpO2: 98%   Weight: 180 lb 12.8 oz (82 kg)   Height: 5' 2 (1.575 m)   PainSc: 0-No pain 0-No pain   Body mass index is 33.07 kg/m.   Wt Readings from Last 3 Encounters:  04/20/24 180 lb 12.8 oz (82 kg)  04/04/24 167 lb (75.8 kg)  03/01/24 172 lb (78 kg)    Body mass index is 33.07 kg/m.  Performance status (ECOG): 1 - Symptomatic but completely ambulatory  PHYSICAL EXAM:   GENERAL:alert, no distress and comfortable SKIN: skin color, texture, turgor are normal, no rashes or significant lesions EYES: normal, Conjunctiva are pink and non-injected, sclera clear OROPHARYNX:no exudate, no erythema and lips, buccal mucosa, and tongue normal  NECK: supple, thyroid normal size, non-tender, without nodularity LYMPH:  no palpable lymphadenopathy in the cervical, axillary or inguinal LUNGS: clear to auscultation and percussion with normal breathing effort HEART: regular rate & rhythm and no murmurs and no lower extremity edema ABDOMEN:abdomen soft, non-tender and normal bowel sounds Musculoskeletal:no cyanosis of digits and no clubbing  NEURO: alert & oriented x 3 with fluent speech, no focal motor/sensory deficits  LABORATORY DATA:  I have reviewed the data as listed    Component Value Date/Time   NA 141 12/09/2023 0842   NA 138 03/11/2023 0845   K 4.2 12/09/2023 0842   CL 104 12/09/2023 0842   CO2 30 12/09/2023 0842   GLUCOSE 93 12/09/2023 0842   BUN 9 12/09/2023 0842   BUN 9 03/11/2023 0845   CREATININE 0.76 12/09/2023 0842   CALCIUM  9.6 12/09/2023 0842   PROT 7.3 12/09/2023 0842   PROT 6.5 03/11/2023 0845   ALBUMIN  4.4 12/09/2023 0842   ALBUMIN  4.4 03/11/2023 0845   AST 15 12/09/2023 0842   ALT 17 12/09/2023 0842   ALKPHOS 73 12/09/2023 0842   BILITOT 0.4 12/09/2023 0842   GFRNONAA >60 12/09/2023  0842   GFRAA 119 05/11/2020 0935    Lab Results  Component Value Date   WBC 6.8 04/20/2024   NEUTROABS 4.3 04/20/2024   HGB 12.6 04/20/2024   HCT 38.9 04/20/2024   MCV 84.4 04/20/2024   PLT 291 04/20/2024

## 2024-04-20 ENCOUNTER — Encounter: Payer: Self-pay | Admitting: Nurse Practitioner

## 2024-04-20 ENCOUNTER — Telehealth: Payer: Self-pay | Admitting: Nurse Practitioner

## 2024-04-20 ENCOUNTER — Ambulatory Visit (HOSPITAL_BASED_OUTPATIENT_CLINIC_OR_DEPARTMENT_OTHER): Admitting: Nurse Practitioner

## 2024-04-20 ENCOUNTER — Other Ambulatory Visit (HOSPITAL_COMMUNITY): Payer: Self-pay

## 2024-04-20 ENCOUNTER — Inpatient Hospital Stay: Attending: Nurse Practitioner

## 2024-04-20 ENCOUNTER — Ambulatory Visit: Payer: Self-pay | Admitting: Nurse Practitioner

## 2024-04-20 VITALS — BP 117/80 | HR 89 | Temp 98.1°F | Resp 17 | Ht 62.0 in | Wt 180.8 lb

## 2024-04-20 DIAGNOSIS — Z96642 Presence of left artificial hip joint: Secondary | ICD-10-CM | POA: Diagnosis not present

## 2024-04-20 DIAGNOSIS — D509 Iron deficiency anemia, unspecified: Secondary | ICD-10-CM | POA: Diagnosis present

## 2024-04-20 DIAGNOSIS — D508 Other iron deficiency anemias: Secondary | ICD-10-CM

## 2024-04-20 DIAGNOSIS — D649 Anemia, unspecified: Secondary | ICD-10-CM

## 2024-04-20 LAB — CBC WITH DIFFERENTIAL (CANCER CENTER ONLY)
Abs Immature Granulocytes: 0.01 K/uL (ref 0.00–0.07)
Basophils Absolute: 0 K/uL (ref 0.0–0.1)
Basophils Relative: 0 %
Eosinophils Absolute: 0 K/uL (ref 0.0–0.5)
Eosinophils Relative: 1 %
HCT: 38.9 % (ref 36.0–46.0)
Hemoglobin: 12.6 g/dL (ref 12.0–15.0)
Immature Granulocytes: 0 %
Lymphocytes Relative: 27 %
Lymphs Abs: 1.9 K/uL (ref 0.7–4.0)
MCH: 27.3 pg (ref 26.0–34.0)
MCHC: 32.4 g/dL (ref 30.0–36.0)
MCV: 84.4 fL (ref 80.0–100.0)
Monocytes Absolute: 0.6 K/uL (ref 0.1–1.0)
Monocytes Relative: 9 %
Neutro Abs: 4.3 K/uL (ref 1.7–7.7)
Neutrophils Relative %: 63 %
Platelet Count: 291 K/uL (ref 150–400)
RBC: 4.61 MIL/uL (ref 3.87–5.11)
RDW: 15.8 % — ABNORMAL HIGH (ref 11.5–15.5)
WBC Count: 6.8 K/uL (ref 4.0–10.5)
nRBC: 0 % (ref 0.0–0.2)

## 2024-04-20 LAB — IRON AND IRON BINDING CAPACITY (CC-WL,HP ONLY)
Iron: 70 ug/dL (ref 28–170)
Saturation Ratios: 19 % (ref 10.4–31.8)
TIBC: 375 ug/dL (ref 250–450)
UIBC: 305 ug/dL (ref 148–442)

## 2024-04-20 LAB — FERRITIN: Ferritin: 269 ng/mL (ref 11–307)

## 2024-04-20 NOTE — Telephone Encounter (Signed)
 Scheduled appointments with the patient per LOS.

## 2024-05-04 ENCOUNTER — Encounter (INDEPENDENT_AMBULATORY_CARE_PROVIDER_SITE_OTHER): Payer: Self-pay | Admitting: Internal Medicine

## 2024-05-04 ENCOUNTER — Encounter: Payer: Self-pay | Admitting: Nurse Practitioner

## 2024-05-04 ENCOUNTER — Other Ambulatory Visit (HOSPITAL_COMMUNITY): Payer: Self-pay

## 2024-05-04 ENCOUNTER — Ambulatory Visit (INDEPENDENT_AMBULATORY_CARE_PROVIDER_SITE_OTHER): Admitting: Internal Medicine

## 2024-05-04 VITALS — BP 127/82 | HR 99 | Temp 98.1°F | Ht 62.0 in | Wt 174.0 lb

## 2024-05-04 DIAGNOSIS — Z7985 Long-term (current) use of injectable non-insulin antidiabetic drugs: Secondary | ICD-10-CM | POA: Diagnosis not present

## 2024-05-04 DIAGNOSIS — F32A Depression, unspecified: Secondary | ICD-10-CM

## 2024-05-04 DIAGNOSIS — E1169 Type 2 diabetes mellitus with other specified complication: Secondary | ICD-10-CM

## 2024-05-04 DIAGNOSIS — E66811 Obesity, class 1: Secondary | ICD-10-CM | POA: Diagnosis not present

## 2024-05-04 DIAGNOSIS — Z6831 Body mass index (BMI) 31.0-31.9, adult: Secondary | ICD-10-CM

## 2024-05-04 DIAGNOSIS — G4733 Obstructive sleep apnea (adult) (pediatric): Secondary | ICD-10-CM

## 2024-05-04 MED ORDER — TIRZEPATIDE 10 MG/0.5ML ~~LOC~~ SOAJ
10.0000 mg | SUBCUTANEOUS | 0 refills | Status: DC
  Filled 2024-05-04 – 2024-05-10 (×2): qty 2, 28d supply, fill #0

## 2024-05-04 NOTE — Progress Notes (Signed)
 Office: [phone]  /  Fax: [phone]  Weight Summary and Body Composition Analysis (BIA)  Vitals Temp: 98.1 F (36.7 C) BP: 127/82 Pulse Rate: 99 SpO2: 99 %   Anthropometric Measurements Height: 5' 2 (1.575 m) Weight: 174 lb (78.9 kg) BMI (Calculated): 31.82 Weight at Last Visit: 167 lb Weight Lost Since Last Visit: 0 lb Weight Gained Since Last Visit: 7 lb Starting Weight: 195 lb Total Weight Loss (lbs): 21 lb (9.526 kg) Peak Weight: 207 lb   Body Composition  Body Fat %: 44.8 % Fat Mass (lbs): 78 lbs Muscle Mass (lbs): 91.4 lbs Total Body Water  (lbs): 63.8 lbs Visceral Fat Rating : 11    No data recorded No data recorded No data recorded  Subjective   Chief Complaint: Obesity  Interval History Discussed the use of AI scribe software for clinical note transcription with the patient, who gave verbal consent to proceed.  History of Present Illness Meghan Wilson is a 63 year old female who presents for medical weight management.  Meghan Wilson has gained seven pounds since her last visit despite adhering to a 1000 calorie nutrition plan 98% of the time. Her diet includes more whole foods, adequate protein intake, and Meghan Wilson maintains hydration without skipping meals. Meghan Wilson exercises three days a week, engaging in 90 minutes of strength and cardio workouts.  Meghan Wilson attributes her weight gain to increased hunger since switching from Ozempic  to Mounjaro .  Meghan Wilson went from 2 mg of Ozempic  to 5 mg of Mounjaro .  Meghan Wilson frequently experiences hunger with stomach growling and needs to eat more often. Her diet includes meats, fruits, and vegetables, avoiding oily or fried foods. Meghan Wilson occasionally consumes grilled chicken with barbecue sauce, baked potatoes, broccoli, corn, squash, fresh greens, fish, and chicken. Meat portions are typically four ounces, but Meghan Wilson eats more when hungry. Meghan Wilson avoids bread, except for occasional sandwiches, and sometimes snacks on beef jerky.   Meghan Wilson feels  depressed, attributing this to ongoing issues with her mother, with whom Meghan Wilson has a strained relationship. Meghan Wilson attends counseling to address these issues and feels discouraged by discrepancies in weight measurements between different scales. To manage stress and depression, Meghan Wilson engages in activities like walking and swimming and volunteers at [employer] to keep her mind occupied.     Challenges affecting patient progress: strong hunger signals and/or impaired satiety / inhibitory control and transitioning between GLP-1.    Pharmacotherapy for weight management: Meghan Wilson is currently taking Monjauro with diabetes as the primary indication and obesity secondary with adequate clinical response  and without side effects..   Assessment and Plan   Treatment Plan For Obesity:  Recommended Dietary Goals  Meghan Wilson is currently in the action stage of change. As such, her goal is to continue weight management plan. Meghan Wilson has agreed to: continue current plan  Behavioral Health and Counseling  We discussed the following behavioral modification strategies today: continue to work on maintaining a reduced calorie state, getting the recommended amount of protein, incorporating whole foods, making healthy choices, staying well hydrated and practicing mindfulness when eating. and increase protein intake, fibrous foods (25 grams per day for women, 30 grams for men) and water  to improve satiety and decrease hunger signals. .  Additional education and resources provided today: None  Recommended Physical Activity Goals  Meghan Wilson has been advised to work up to 150 minutes of moderate intensity aerobic activity a week and strengthening exercises 2-3 times per week for cardiovascular health, weight loss maintenance and preservation of muscle mass.  Meghan Wilson has agreed to :  Increase volume of physical activity to a goal of 240 minutes a week and Combine aerobic and strengthening exercises for efficiency and improved  cardiometabolic health.  Medical Interventions and Pharmacotherapy  We discussed various medication options to help Meghan Wilson with her weight loss efforts and we both agreed to : Increase Mounjaro  to 10 mg once a week  Associated Conditions Impacted by Obesity Treatment  Assessment & Plan Type 2 diabetes mellitus with obesity (HCC) Patient is currently transitioning from Ozempic  to Mounjaro .  Her diabetes is well-controlled but Meghan Wilson has increased hunger and decrease satiety since switching to lower dose of Mounjaro .  We will increase medication to 10 mg dose. Depression, unspecified depression type Reports feeling depressed, related to familial stressors, particularly with her mother. Depression exacerbated by weight gain and hunger issues. - Continue counseling sessions. - Engage in relaxing activities like walking, swimming, and meditation. - Discussed the importance of setting boundaries in relationships that are not supportive or healthy. Class 1 obesity with serious comorbidity and body mass index (BMI) of 31.0 to 31.9 in adult, unspecified obesity type Weight gain of seven pounds since last visit. Currently on a 1000 calorie nutrition plan, exercising three days a week with strength and cardio. Transitioning from Ozempic  to Mounjaro , experiencing increased hunger and food noise. No side effects reported from Mounjaro , except for pre-existing dry eye condition. - Increase Mounjaro  dose to 10 mg to better manage appetite and align closer to previous Ozempic  dose. - Send prescription to [facility] pharmacy on [address] - Advise to report any nausea or adverse effects, as higher dose may cause nausea. - Recommend protein snacks like [place] yogurt, cottage cheese, nuts with an apple, or protein shakes during hunger spells. - Schedule follow-up in four weeks.          Objective   Physical Exam:  Blood pressure 127/82, pulse 99, temperature 98.1 F (36.7 C), height 5' 2 (1.575  m), weight 174 lb (78.9 kg), SpO2 99%. Body mass index is 31.83 kg/m.  General: Meghan Wilson is overweight, cooperative, alert, well developed, and in no acute distress. PSYCH: Has normal mood, affect and thought process.   HEENT: EOMI, sclerae are anicteric. Lungs: Normal breathing effort, no conversational dyspnea. Extremities: No edema.  Neurologic: No gross sensory or motor deficits. No tremors or fasciculations noted.    Diagnostic Data Reviewed:  BMET    Component Value Date/Time   NA 141 12/09/2023 0842   NA 138 03/11/2023 0845   K 4.2 12/09/2023 0842   CL 104 12/09/2023 0842   CO2 30 12/09/2023 0842   GLUCOSE 93 12/09/2023 0842   BUN 9 12/09/2023 0842   BUN 9 03/11/2023 0845   CREATININE 0.76 12/09/2023 0842   CALCIUM  9.6 12/09/2023 0842   GFRNONAA >60 12/09/2023 0842   GFRAA 119 05/11/2020 0935   Lab Results  Component Value Date   HGBA1C 5.7 (A) 01/11/2024   HGBA1C 6.5 (H) 04/02/2018   Lab Results  Component Value Date   INSULIN  6.1 03/11/2023   INSULIN  5.8 11/21/2021   Lab Results  Component Value Date   TSH 0.650 01/11/2024   CBC    Component Value Date/Time   WBC 6.8 04/20/2024 0913   WBC 5.8 10/09/2023 0443   RBC 4.61 04/20/2024 0913   HGB 12.6 04/20/2024 0913   HGB 11.9 06/25/2022 1420   HCT 38.9 04/20/2024 0913   HCT 38.5 06/25/2022 1420   PLT 291 04/20/2024 0913   PLT  398 06/25/2022 1420   MCV 84.4 04/20/2024 0913   MCV 81 06/25/2022 1420   MCH 27.3 04/20/2024 0913   MCHC 32.4 04/20/2024 0913   RDW 15.8 (H) 04/20/2024 0913   RDW 13.5 06/25/2022 1420   Iron Studies    Component Value Date/Time   IRON 70 04/20/2024 0913   IRON 24 (L) 06/25/2022 1420   TIBC 375 04/20/2024 0913   TIBC 351 06/25/2022 1420   FERRITIN 269 04/20/2024 0913   FERRITIN 81 06/25/2022 1420   IRONPCTSAT 19 04/20/2024 0913   IRONPCTSAT 7 (LL) 06/25/2022 1420   Lipid Panel     Component Value Date/Time   CHOL 144 09/04/2022 1027   TRIG 52 09/04/2022 1027   HDL 70  09/04/2022 1027   CHOLHDL 2.7 05/02/2021 1158   CHOLHDL 2.9 04/02/2018 1115   VLDL 51 (H) 04/02/2018 1115   LDLCALC 63 09/04/2022 1027   Hepatic Function Panel     Component Value Date/Time   PROT 7.3 12/09/2023 0842   PROT 6.5 03/11/2023 0845   ALBUMIN  4.4 12/09/2023 0842   ALBUMIN  4.4 03/11/2023 0845   AST 15 12/09/2023 0842   ALT 17 12/09/2023 0842   ALKPHOS 73 12/09/2023 0842   BILITOT 0.4 12/09/2023 0842      Component Value Date/Time   TSH 0.650 01/11/2024 1100   Nutritional Lab Results  Component Value Date   VD25OH 78.3 03/11/2023   VD25OH 54.3 09/04/2022   VD25OH 34.2 11/21/2021    Medications: Outpatient Encounter Medications as of 05/04/2024  Medication Sig   Accu-Chek FastClix Lancets MISC Use to check blood sugar up to 7 times a week   atorvastatin  (LIPITOR) 80 MG tablet Take 1 tablet (80 mg total) by mouth daily.   bacitracin -polymyxin b  (POLYSPORIN ) ophthalmic ointment Apply a small amount into the right eye 3 (three) times daily.   Capsaicin  0.035 % CREA Apply 1 application  topically 2 (two) times daily as needed.   cycloSPORINE  (RESTASIS ) 0.05 % ophthalmic emulsion Place 1 drop into both eyes 2 (two) times daily.   cycloSPORINE  (RESTASIS ) 0.05 % ophthalmic emulsion Place 1 drop into both eyes 2 (two) times daily.   doxepin  (SINEQUAN ) 10 MG capsule Take 1 capsule (10 mg total) by mouth at bedtime as needed for sleep.   erythromycin  ophthalmic ointment Apply a small amount into the right eye 3 (three) times daily.   ferrous sulfate  325 (65 FE) MG tablet Take 325 mg by mouth daily with breakfast.   fluorometholone  (FML) 0.1 % ophthalmic suspension Place 1 drop into the right eye 4 (four) times daily.   fluticasone  (FLONASE ) 50 MCG/ACT nasal spray Place 1 spray into both nostrils daily as needed for allergies or rhinitis   glucose blood test strip USE TO CHECK BLOOD GLUCOSE UP TO 7 TIMES A WEEK.   Glycerin -Hypromellose-PEG 400 (DRY EYE RELIEF DROPS) 0.2-0.2-1  % SOLN Place 1 drop into both eyes in the morning and at bedtime.   lamoTRIgine  (LAMICTAL ) 100 MG tablet Take 1 tablet (100 mg total) by mouth daily.   linaclotide  (LINZESS ) 145 MCG CAPS capsule Take 1 capsule (145 mcg total) by mouth daily before breakfast. Please keep your October appointment with Dr. Leigh for any further refills. Thank you   Multiple Vitamins-Minerals (MULTIVITAMIN WITH MINERALS) tablet Take 1 tablet by mouth daily.   ondansetron  (ZOFRAN ) 4 MG tablet Take 1 tablet (4 mg total) by mouth every 6 (six) hours as needed for nausea.   polyethylene glycol (MIRALAX  / GLYCOLAX )  17 g packet Mix 1 packet (17 g total) in 8 oz clear liquid and drink by mouth daily as needed for mild constipation.   prazosin  (MINIPRESS ) 1 MG capsule Take 1 capsule by mouth at bedtime   tirzepatide  (MOUNJARO ) 10 MG/0.5ML Pen Inject 10 mg into the skin once a week.   triamcinolone  cream (KENALOG ) 0.1 % Apply 1 Application topically underneath breast daily as needed.   triamcinolone  ointment (KENALOG ) 0.5 % Apply 1 Application topically to hands daily as needed.   [DISCONTINUED] tirzepatide  (MOUNJARO ) 5 MG/0.5ML Pen Inject 5 mg into the skin once a week.   No facility-administered encounter medications on file as of 05/04/2024.     Follow-Up   Return in about 4 weeks (around 06/01/2024) for For Weight Mangement with Dr. [email or web address].SABRA Meghan Wilson was informed of the importance of frequent follow up visits to maximize her success with intensive lifestyle modifications for her multiple health conditions.  Attestation Statement   Reviewed by clinician on day of visit: allergies, medications, problem list, medical history, surgical history, family history, social history, and previous encounter notes.     Lucas Francyne, MD

## 2024-05-04 NOTE — Assessment & Plan Note (Signed)
 Weight gain of seven pounds since last visit. Currently on a 1000 calorie nutrition plan, exercising three days a week with strength and cardio. Transitioning from Ozempic  to Mounjaro , experiencing increased hunger and food noise. No side effects reported from Mounjaro , except for pre-existing dry eye condition. - Increase Mounjaro  dose to 10 mg to better manage appetite and align closer to previous Ozempic  dose. - Send prescription to [facility] pharmacy on [id number] - Advise to report any nausea or adverse effects, as higher dose may cause nausea. - Recommend protein snacks like [place] yogurt, cottage cheese, nuts with an apple, or protein shakes during hunger spells. - Schedule follow-up in four weeks.

## 2024-05-04 NOTE — Assessment & Plan Note (Signed)
 Reports feeling depressed, related to familial stressors, particularly with her mother. Depression exacerbated by weight gain and hunger issues. - Continue counseling sessions. - Engage in relaxing activities like walking, swimming, and meditation. - Discussed the importance of setting boundaries in relationships that are not supportive or healthy.

## 2024-05-04 NOTE — Assessment & Plan Note (Signed)
 Patient is currently transitioning from Ozempic  to Mounjaro .  Her diabetes is well-controlled but she has increased hunger and decrease satiety since switching to lower dose of Mounjaro .  We will increase medication to 10 mg dose.

## 2024-05-09 ENCOUNTER — Telehealth (INDEPENDENT_AMBULATORY_CARE_PROVIDER_SITE_OTHER): Payer: Self-pay

## 2024-05-09 ENCOUNTER — Encounter (INDEPENDENT_AMBULATORY_CARE_PROVIDER_SITE_OTHER): Payer: Self-pay

## 2024-05-09 NOTE — Telephone Encounter (Signed)
 Mounjaro  10 mg approved pt notified

## 2024-05-09 NOTE — Telephone Encounter (Signed)
 PA for Mounjaro  10 mg started - DMII

## 2024-05-10 ENCOUNTER — Other Ambulatory Visit (HOSPITAL_COMMUNITY): Payer: Self-pay

## 2024-05-13 ENCOUNTER — Encounter: Payer: Self-pay | Admitting: Nurse Practitioner

## 2024-05-19 ENCOUNTER — Encounter: Payer: Self-pay | Admitting: Nurse Practitioner

## 2024-05-20 ENCOUNTER — Other Ambulatory Visit (HOSPITAL_COMMUNITY): Payer: Self-pay

## 2024-05-24 ENCOUNTER — Encounter: Payer: Self-pay | Admitting: Nurse Practitioner

## 2024-05-31 ENCOUNTER — Other Ambulatory Visit (HOSPITAL_COMMUNITY): Payer: Self-pay

## 2024-05-31 ENCOUNTER — Other Ambulatory Visit: Payer: Self-pay

## 2024-06-01 ENCOUNTER — Other Ambulatory Visit (HOSPITAL_COMMUNITY): Payer: Self-pay

## 2024-06-01 ENCOUNTER — Ambulatory Visit (INDEPENDENT_AMBULATORY_CARE_PROVIDER_SITE_OTHER): Admitting: Internal Medicine

## 2024-06-01 VITALS — BP 131/78 | HR 100 | Temp 98.0°F | Ht 62.0 in | Wt 175.0 lb

## 2024-06-01 DIAGNOSIS — E66811 Obesity, class 1: Secondary | ICD-10-CM | POA: Diagnosis not present

## 2024-06-01 DIAGNOSIS — Z7985 Long-term (current) use of injectable non-insulin antidiabetic drugs: Secondary | ICD-10-CM

## 2024-06-01 DIAGNOSIS — Z6831 Body mass index (BMI) 31.0-31.9, adult: Secondary | ICD-10-CM | POA: Diagnosis not present

## 2024-06-01 DIAGNOSIS — G4733 Obstructive sleep apnea (adult) (pediatric): Secondary | ICD-10-CM

## 2024-06-01 DIAGNOSIS — E119 Type 2 diabetes mellitus without complications: Secondary | ICD-10-CM | POA: Diagnosis not present

## 2024-06-01 DIAGNOSIS — E669 Obesity, unspecified: Secondary | ICD-10-CM

## 2024-06-01 MED ORDER — LAMOTRIGINE 100 MG PO TABS
100.0000 mg | ORAL_TABLET | Freq: Every day | ORAL | 2 refills | Status: AC
  Filled 2024-06-01 – 2024-06-22 (×3): qty 30, 30d supply, fill #0
  Filled 2024-07-20: qty 30, 30d supply, fill #1
  Filled 2024-08-29: qty 30, 30d supply, fill #2

## 2024-06-01 MED ORDER — TIRZEPATIDE 10 MG/0.5ML ~~LOC~~ SOAJ
10.0000 mg | SUBCUTANEOUS | 0 refills | Status: DC
  Filled 2024-06-01: qty 2, 28d supply, fill #0

## 2024-06-01 MED ORDER — DOXEPIN HCL 10 MG PO CAPS
10.0000 mg | ORAL_CAPSULE | Freq: Every evening | ORAL | 2 refills | Status: AC | PRN
  Filled 2024-06-01: qty 30, 30d supply, fill #0

## 2024-06-01 NOTE — Assessment & Plan Note (Signed)
 Patient is currently transitioning from Ozempic  to Mounjaro .  She is currently on Mounjaro  10 mg once a week without any adverse effects.  No signs or symptoms of hypoglycemia continue current weight management strategy inclusive of GLP-1.

## 2024-06-01 NOTE — Progress Notes (Signed)
 Office: [phone]  /  Fax: [phone]  Weight Summary and Body Composition Analysis (BIA)  Vitals Temp: 98 F (36.7 C) BP: 131/78 Pulse Rate: 100 SpO2: 96 %   Anthropometric Measurements Height: 5' 2 (1.575 m) Weight: 175 lb (79.4 kg) BMI (Calculated): 32 Weight at Last Visit: 174 lb Weight Lost Since Last Visit: 0 lb Weight Gained Since Last Visit: 1 lb Starting Weight: 195 lb Total Weight Loss (lbs): 20 lb (9.072 kg) Peak Weight: 207 lb   Body Composition  Body Fat %: 45.4 % Fat Mass (lbs): 79.8 lbs Muscle Mass (lbs): 91.2 lbs Total Body Water  (lbs): 65 lbs Visceral Fat Rating : 12    RMR: 1627  Today's Visit #: 36  Starting Date: 11/01/21   Subjective   Chief Complaint: Obesity  Interval History Discussed the use of AI scribe software for clinical note transcription with the patient, who gave verbal consent to proceed.  History of Present Illness Meghan Wilson is a 63 year old female with type two diabetes who presents for medical weight management.  She has gained one pound since her last office visit. She adheres to a 1200 calorie nutrition plan 90% of the time, tracks her intake, consumes more whole foods, meets the recommended protein intake, and maintains good hydration. She exercises three days a week, swimming for 60 to 90 minutes each session.  She is currently on Mounjaro  for type two diabetes, having transitioned from Ozempic . She has completed three out of four doses of the 10 mg Mounjaro  due to a missed dose. She feels better on Mounjaro  compared to Ozempic , with no nausea or constipation, and experiences less 'food noise'. She is not as hungry and sometimes has to make herself eat.  Her social history includes a recent increase in stress due to her son's upcoming wedding, for which she is preparing food for 80 people. She describes herself as an empathetic person who often feels the emotions of others, which can be both a strength and a  challenge.     Challenges affecting patient progress: medical comorbidities and menopause.    Pharmacotherapy for weight management: She is currently taking Monjauro with diabetes as the primary indication and obesity secondary with adequate clinical response  and without side effects..   Assessment and Plan   Treatment Plan For Obesity:  Recommended Dietary Goals  Meghan Wilson is currently in the action stage of change. As such, her goal is to continue weight management plan. She has agreed to: continue current plan  Behavioral Health and Counseling  We discussed the following behavioral modification strategies today: continue to work on maintaining a reduced calorie state, getting the recommended amount of protein, incorporating whole foods, making healthy choices, staying well hydrated and practicing mindfulness when eating. and increase protein intake, fibrous foods (25 grams per day for women, 30 grams for men) and water  to improve satiety and decrease hunger signals. .  Additional education and resources provided today: None  Recommended Physical Activity Goals  Meghan Wilson has been advised to work up to 150 minutes of moderate intensity aerobic activity a week and strengthening exercises 2-3 times per week for cardiovascular health, weight loss maintenance and preservation of muscle mass.  She has agreed to :  Increase volume of physical activity to a goal of 240 minutes a week and Combine aerobic and strengthening exercises for efficiency and improved cardiometabolic health.  Medical Interventions and Pharmacotherapy  We discussed various medication options to help Meghan Wilson with her weight loss efforts  and we both agreed to : Adequate clinical response to anti-obesity medication, continue current regimen  Associated Conditions Impacted by Obesity Treatment  Assessment & Plan Type 2 diabetes mellitus in patient with obesity Meghan Wilson) Patient is currently transitioning from Ozempic  to  Mounjaro .  She is currently on Mounjaro  10 mg once a week without any adverse effects.  No signs or symptoms of hypoglycemia continue current weight management strategy inclusive of GLP-1. Class 1 obesity with serious comorbidity and body mass index (BMI) of 31.0 to 31.9 in adult, unspecified obesity type Weight: decrease of 32.9 lb (15.8%) over 2 years  Start: 05/28/2022 207 lb 14.4 oz (94.3 kg)  End: 06/01/2024 175 lb (79.4 kg)  Obesity management is ongoing with a recent weight gain of one pound. She is on a 1200 calorie nutrition plan with 90% adherence and engages in swimming three days a week for 60-90 minutes. Transitioned from Ozempic  to Mounjaro , currently on a 10 mg dose, resulting in reduced appetite and less food noise without nausea or constipation. The one-pound weight gain is not significant, and maintaining weight during stress is positive. Emphasized the importance of a healthy gut microbiome on cravings and mental health. Discussed potential dose adjustment if weight plateau occurs. - Continue Mounjaro  10 mg - Encourage continuation of current exercise regimen - Maintain 1200 calorie nutrition plan - Reinforce adherence to whole foods and hydration - Monitor weight and dietary habits - Discuss potential dose adjustment of Mounjaro  if weight plateau occurs OSA on CPAP On CPAP with reported good compliance. Continue PAP therapy. Losing 15% or more of body weight may improve AHI.             Objective   Physical Exam:  Blood pressure 131/78, pulse 100, temperature 98 F (36.7 C), height 5' 2 (1.575 m), weight 175 lb (79.4 kg), SpO2 96%. Body mass index is 32.01 kg/m.  General: She is overweight, cooperative, alert, well developed, and in no acute distress. PSYCH: Has normal mood, affect and thought process.   HEENT: EOMI, sclerae are anicteric. Lungs: Normal breathing effort, no conversational dyspnea. Extremities: No edema.  Neurologic: No gross sensory or motor  deficits. No tremors or fasciculations noted.    Diagnostic Data Reviewed:  BMET    Component Value Date/Time   NA 141 12/09/2023 0842   NA 138 03/11/2023 0845   K 4.2 12/09/2023 0842   CL 104 12/09/2023 0842   CO2 30 12/09/2023 0842   GLUCOSE 93 12/09/2023 0842   BUN 9 12/09/2023 0842   BUN 9 03/11/2023 0845   CREATININE 0.76 12/09/2023 0842   CALCIUM  9.6 12/09/2023 0842   GFRNONAA >60 12/09/2023 0842   GFRAA 119 05/11/2020 0935   Lab Results  Component Value Date   HGBA1C 5.7 (A) 01/11/2024   HGBA1C 6.5 (H) 04/02/2018   Lab Results  Component Value Date   INSULIN  6.1 03/11/2023   INSULIN  5.8 11/21/2021   Lab Results  Component Value Date   TSH 0.650 01/11/2024   CBC    Component Value Date/Time   WBC 6.8 04/20/2024 0913   WBC 5.8 10/09/2023 0443   RBC 4.61 04/20/2024 0913   HGB 12.6 04/20/2024 0913   HGB 11.9 06/25/2022 1420   HCT 38.9 04/20/2024 0913   HCT 38.5 06/25/2022 1420   PLT 291 04/20/2024 0913   PLT 398 06/25/2022 1420   MCV 84.4 04/20/2024 0913   MCV 81 06/25/2022 1420   MCH 27.3 04/20/2024 0913   MCHC 32.4 04/20/2024 0913  RDW 15.8 (H) 04/20/2024 0913   RDW 13.5 06/25/2022 1420   Iron Studies    Component Value Date/Time   IRON 70 04/20/2024 0913   IRON 24 (L) 06/25/2022 1420   TIBC 375 04/20/2024 0913   TIBC 351 06/25/2022 1420   FERRITIN 269 04/20/2024 0913   FERRITIN 81 06/25/2022 1420   IRONPCTSAT 19 04/20/2024 0913   IRONPCTSAT 7 (LL) 06/25/2022 1420   Lipid Panel     Component Value Date/Time   CHOL 144 09/04/2022 1027   TRIG 52 09/04/2022 1027   HDL 70 09/04/2022 1027   CHOLHDL 2.7 05/02/2021 1158   CHOLHDL 2.9 04/02/2018 1115   VLDL 51 (H) 04/02/2018 1115   LDLCALC 63 09/04/2022 1027   Hepatic Function Panel     Component Value Date/Time   PROT 7.3 12/09/2023 0842   PROT 6.5 03/11/2023 0845   ALBUMIN  4.4 12/09/2023 0842   ALBUMIN  4.4 03/11/2023 0845   AST 15 12/09/2023 0842   ALT 17 12/09/2023 0842   ALKPHOS  73 12/09/2023 0842   BILITOT 0.4 12/09/2023 0842      Component Value Date/Time   TSH 0.650 01/11/2024 1100   Nutritional Lab Results  Component Value Date   VD25OH 78.3 03/11/2023   VD25OH 54.3 09/04/2022   VD25OH 34.2 11/21/2021    Medications: Outpatient Encounter Medications as of 06/01/2024  Medication Sig   Accu-Chek FastClix Lancets MISC Use to check blood sugar up to 7 times a week   atorvastatin  (LIPITOR) 80 MG tablet Take 1 tablet (80 mg total) by mouth daily.   bacitracin -polymyxin b  (POLYSPORIN ) ophthalmic ointment Apply a small amount into the right eye 3 (three) times daily.   Capsaicin  0.035 % CREA Apply 1 application  topically 2 (two) times daily as needed.   cycloSPORINE  (RESTASIS ) 0.05 % ophthalmic emulsion Place 1 drop into both eyes 2 (two) times daily.   doxepin  (SINEQUAN ) 10 MG capsule Take 1 capsule (10 mg total) by mouth at bedtime as needed for sleep   erythromycin  ophthalmic ointment Apply a small amount into the right eye 3 (three) times daily.   ferrous sulfate  325 (65 FE) MG tablet Take 325 mg by mouth daily with breakfast.   fluorometholone  (FML) 0.1 % ophthalmic suspension Place 1 drop into the right eye 4 (four) times daily.   fluticasone  (FLONASE ) 50 MCG/ACT nasal spray Place 1 spray into both nostrils daily as needed for allergies or rhinitis   glucose blood test strip USE TO CHECK BLOOD GLUCOSE UP TO 7 TIMES A WEEK.   Glycerin -Hypromellose-PEG 400 (DRY EYE RELIEF DROPS) 0.2-0.2-1 % SOLN Place 1 drop into both eyes in the morning and at bedtime.   lamoTRIgine  (LAMICTAL ) 100 MG tablet Take 1 tablet by mouth once a day   linaclotide  (LINZESS ) 145 MCG CAPS capsule Take 1 capsule (145 mcg total) by mouth daily before breakfast. Please keep your October appointment with Dr. Leigh for any further refills. Thank you   Multiple Vitamins-Minerals (MULTIVITAMIN WITH MINERALS) tablet Take 1 tablet by mouth daily.   ondansetron  (ZOFRAN ) 4 MG tablet Take 1  tablet (4 mg total) by mouth every 6 (six) hours as needed for nausea.   polyethylene glycol (MIRALAX  / GLYCOLAX ) 17 g packet Mix 1 packet (17 g total) in 8 oz clear liquid and drink by mouth daily as needed for mild constipation.   prazosin  (MINIPRESS ) 1 MG capsule Take 1 capsule by mouth at bedtime   triamcinolone  cream (KENALOG ) 0.1 % Apply 1 Application  topically underneath breast daily as needed.   triamcinolone  ointment (KENALOG ) 0.5 % Apply 1 Application topically to hands daily as needed.   [DISCONTINUED] cycloSPORINE  (RESTASIS ) 0.05 % ophthalmic emulsion Place 1 drop into both eyes 2 (two) times daily.   [DISCONTINUED] doxepin  (SINEQUAN ) 10 MG capsule Take 1 capsule (10 mg total) by mouth at bedtime as needed for sleep.   [DISCONTINUED] tirzepatide  (MOUNJARO ) 10 MG/0.5ML Pen Inject 10 mg into the skin once a week.   tirzepatide  (MOUNJARO ) 10 MG/0.5ML Pen Inject 10 mg into the skin once a week.   [DISCONTINUED] lamoTRIgine  (LAMICTAL ) 100 MG tablet Take 1 tablet (100 mg total) by mouth daily.   No facility-administered encounter medications on file as of 06/01/2024.     Follow-Up   Return in about 4 weeks (around 06/29/2024) for For Weight Mangement with Dr. Francyne.SABRA She was informed of the importance of frequent follow up visits to maximize her success with intensive lifestyle modifications for her multiple health conditions.  Attestation Statement   Reviewed by clinician on day of visit: allergies, medications, problem list, medical history, surgical history, family history, social history, and previous encounter notes.     Lucas Francyne, MD

## 2024-06-01 NOTE — Assessment & Plan Note (Signed)
 On CPAP with reported good compliance. Continue PAP therapy. Losing 15% or more of body weight may improve AHI.

## 2024-06-01 NOTE — Assessment & Plan Note (Signed)
 Weight: decrease of 32.9 lb (15.8%) over 2 years  Start: 05/28/2022 207 lb 14.4 oz (94.3 kg)  End: 06/01/2024 175 lb (79.4 kg)  Obesity management is ongoing with a recent weight gain of one pound. She is on a 1200 calorie nutrition plan with 90% adherence and engages in swimming three days a week for 60-90 minutes. Transitioned from Ozempic  to Mounjaro , currently on a 10 mg dose, resulting in reduced appetite and less food noise without nausea or constipation. The one-pound weight gain is not significant, and maintaining weight during stress is positive. Emphasized the importance of a healthy gut microbiome on cravings and mental health. Discussed potential dose adjustment if weight plateau occurs. - Continue Mounjaro  10 mg - Encourage continuation of current exercise regimen - Maintain 1200 calorie nutrition plan - Reinforce adherence to whole foods and hydration - Monitor weight and dietary habits - Discuss potential dose adjustment of Mounjaro  if weight plateau occurs

## 2024-06-02 ENCOUNTER — Other Ambulatory Visit (HOSPITAL_COMMUNITY): Payer: Self-pay

## 2024-06-14 ENCOUNTER — Other Ambulatory Visit (HOSPITAL_COMMUNITY): Payer: Self-pay

## 2024-06-20 DIAGNOSIS — M7531 Calcific tendinitis of right shoulder: Secondary | ICD-10-CM | POA: Diagnosis not present

## 2024-06-21 ENCOUNTER — Ambulatory Visit (INDEPENDENT_AMBULATORY_CARE_PROVIDER_SITE_OTHER): Admitting: Internal Medicine

## 2024-06-21 ENCOUNTER — Other Ambulatory Visit (HOSPITAL_COMMUNITY): Payer: Self-pay

## 2024-06-21 ENCOUNTER — Encounter: Payer: Self-pay | Admitting: Nurse Practitioner

## 2024-06-21 VITALS — BP 111/72 | HR 82 | Temp 98.7°F | Ht 62.0 in | Wt 177.0 lb

## 2024-06-21 DIAGNOSIS — E119 Type 2 diabetes mellitus without complications: Secondary | ICD-10-CM

## 2024-06-21 DIAGNOSIS — Z6831 Body mass index (BMI) 31.0-31.9, adult: Secondary | ICD-10-CM | POA: Diagnosis not present

## 2024-06-21 DIAGNOSIS — G4733 Obstructive sleep apnea (adult) (pediatric): Secondary | ICD-10-CM

## 2024-06-21 DIAGNOSIS — E66811 Obesity, class 1: Secondary | ICD-10-CM | POA: Diagnosis not present

## 2024-06-21 DIAGNOSIS — Z7985 Long-term (current) use of injectable non-insulin antidiabetic drugs: Secondary | ICD-10-CM | POA: Diagnosis not present

## 2024-06-21 DIAGNOSIS — E669 Obesity, unspecified: Secondary | ICD-10-CM

## 2024-06-21 MED ORDER — ACETAMINOPHEN-CODEINE 300-30 MG PO TABS
1.0000 | ORAL_TABLET | Freq: Two times a day (BID) | ORAL | 0 refills | Status: DC | PRN
  Filled 2024-06-21: qty 30, 15d supply, fill #0

## 2024-06-21 MED ORDER — TIRZEPATIDE 10 MG/0.5ML ~~LOC~~ SOAJ
10.0000 mg | SUBCUTANEOUS | 0 refills | Status: DC
Start: 2024-06-21 — End: 2024-07-19
  Filled 2024-06-21 – 2024-06-22 (×2): qty 2, 28d supply, fill #0

## 2024-06-21 NOTE — Assessment & Plan Note (Signed)
  Obesity, class 1 with recent weight gain of two pounds. Adherence to a reduced calorie nutrition plan of 1000 calories per day reported, but recent dietary indiscretions noted, including consumption of a large sub and increased eating out due to social events. Exercise regimen includes 120 minutes of activity three days a week. Current medication is Mounjaro  10 mg, with noted improvement in hunger control when doses are not missed. - Continue Mounjaro  10 mg for another month - Encourage tracking of caloric intake using free apps like Lose It, My Fitness Pal, or My Net Diary - Advise maintaining caloric intake under 1300 calories per day, aiming for 1000 calories - Consider increasing Mounjaro  dose if appetite control does not improve - Recommend meal replacement with protein shakes to help manage caloric intake

## 2024-06-21 NOTE — Assessment & Plan Note (Signed)
 Patient is currently transitioning from Ozempic  to Mounjaro .  She is currently on Mounjaro  10 mg once a week without any adverse effects.  No signs or symptoms of hypoglycemia continue current weight management strategy inclusive of GLP-1.

## 2024-06-21 NOTE — Progress Notes (Signed)
 Office: [phone]  /  Fax: [phone]  Weight Summary and Body Composition Analysis (BIA)  Vitals Temp: 98.7 F (37.1 C) BP: 111/72 Pulse Rate: 82 SpO2: 99 %   Anthropometric Measurements Height: 5' 2 (1.575 m) Weight: 177 lb (80.3 kg) BMI (Calculated): 32.37 Weight at Last Visit: 175lb Weight Lost Since Last Visit: 0lb Weight Gained Since Last Visit: 2lb Starting Weight: 195lb Total Weight Loss (lbs): 18 lb (8.165 kg) Peak Weight: 207lb   Body Composition  Body Fat %: 45.5 % Fat Mass (lbs): 80.6 lbs Muscle Mass (lbs): 91.4 lbs Total Body Water  (lbs): 66 lbs Visceral Fat Rating : 13    RMR: 1627  Today's Visit #: 37  Starting Date: 11/01/21   Subjective   Chief Complaint: Obesity  Interval History Discussed the use of AI scribe software for clinical note transcription with the patient, who gave verbal consent to proceed.  History of Present Illness Meghan Wilson is a 63 year old female who presents for medical weight management.  She has gained two pounds since her last visit despite adhering to a 1,000 calorie per day nutrition plan, tracking her intake, and ensuring adequate protein consumption. She attributes the weight gain to recent dietary choices, including consuming a large sub over three days and increased food intake while cooking for her son's wedding.  Her exercise routine includes swimming and walking a mile three days a week for 120 minutes. She is disappointed in herself for not losing weight and mentions increased eating due to stress related to her mother's health.  She is currently on Mounjaro  10 mg, having switched from Ozempic . She has completed three shots of the 10 mg dose, missing one due to a mishap. Missing a dose increases her hunger, but when on schedule, her hunger is better controlled. She feels full sooner and leaves some food uneaten, though not a significant amount. No nausea or constipation, and she drinks plenty of  water .  Her daily routine includes eating a boiled egg or oatmeal for breakfast, though she finds oatmeal 'clogging in my chest'. She also consumes beef jerky and yogurt, and has been eating more fruits and vegetables. She tracks her meals manually and is interested in using an app for calorie tracking but has faced challenges with app subscriptions.     Challenges affecting patient progress: slow metabolism for age, menopause, and weight-centric mindset.    Pharmacotherapy for weight management: She is currently taking Monjauro with diabetes as the primary indication and obesity secondary with adequate clinical response  and without side effects..   Assessment and Plan   Treatment Plan For Obesity:  Recommended Dietary Goals  Tyiana is currently in the action stage of change. As such, her goal is to continue weight management plan. She has agreed to: continue current plan  Behavioral Health and Counseling  We discussed the following behavioral modification strategies today: continue to work on maintaining a reduced calorie state, getting the recommended amount of protein, incorporating whole foods, making healthy choices, staying well hydrated and practicing mindfulness when eating. and increase protein intake, fibrous foods (25 grams per day for women, 30 grams for men) and water  to improve satiety and decrease hunger signals. .  Additional education and resources provided today: None  Recommended Physical Activity Goals  Calaya has been advised to work up to 150 minutes of moderate intensity aerobic activity a week and strengthening exercises 2-3 times per week for cardiovascular health, weight loss maintenance and preservation of muscle mass.  She has agreed to :  Increase volume of physical activity to a goal of 240 minutes a week and Combine aerobic and strengthening exercises for efficiency and improved cardiometabolic health.  Medical Interventions and Pharmacotherapy  We  discussed various medication options to help Daishia with her weight loss efforts and we both agreed to : Adequate clinical response to anti-obesity medication, continue current regimen  Associated Conditions Impacted by Obesity Treatment  Assessment & Plan OSA on CPAP On CPAP with reported good compliance. Continue PAP therapy. Losing 15% or more of body weight may improve AHI.    Type 2 diabetes mellitus in patient with obesity [facility]) Patient is currently transitioning from Ozempic  to Mounjaro .  She is currently on Mounjaro  10 mg once a week without any adverse effects.  No signs or symptoms of hypoglycemia continue current weight management strategy inclusive of GLP-1. Class 1 obesity with serious comorbidity and body mass index (BMI) of 31.0 to 31.9 in adult, unspecified obesity type  Obesity, class 1 with recent weight gain of two pounds. Adherence to a reduced calorie nutrition plan of 1000 calories per day reported, but recent dietary indiscretions noted, including consumption of a large sub and increased eating out due to social events. Exercise regimen includes 120 minutes of activity three days a week. Current medication is Mounjaro  10 mg, with noted improvement in hunger control when doses are not missed. - Continue Mounjaro  10 mg for another month - Encourage tracking of caloric intake using free apps like Lose It, My Fitness Pal, or My Net Diary - Advise maintaining caloric intake under 1300 calories per day, aiming for 1000 calories - Consider increasing Mounjaro  dose if appetite control does not improve - Recommend meal replacement with protein shakes to help manage caloric intake        Objective   Physical Exam:  Blood pressure 111/72, pulse 82, temperature 98.7 F (37.1 C), height 5' 2 (1.575 m), weight 177 lb (80.3 kg), SpO2 99%. Body mass index is 32.37 kg/m.  General: She is overweight, cooperative, alert, well developed, and in no acute distress. PSYCH: Has  normal mood, affect and thought process.   HEENT: EOMI, sclerae are anicteric. Lungs: Normal breathing effort, no conversational dyspnea. Extremities: No edema.  Neurologic: No gross sensory or motor deficits. No tremors or fasciculations noted.    Diagnostic Data Reviewed:  BMET    Component Value Date/Time   NA 141 12/09/2023 0842   NA 138 03/11/2023 0845   K 4.2 12/09/2023 0842   CL 104 12/09/2023 0842   CO2 30 12/09/2023 0842   GLUCOSE 93 12/09/2023 0842   BUN 9 12/09/2023 0842   BUN 9 03/11/2023 0845   CREATININE 0.76 12/09/2023 0842   CALCIUM  9.6 12/09/2023 0842   GFRNONAA >60 12/09/2023 0842   GFRAA 119 05/11/2020 0935   Lab Results  Component Value Date   HGBA1C 5.7 (A) 01/11/2024   HGBA1C 6.5 (H) 04/02/2018   Lab Results  Component Value Date   INSULIN  6.1 03/11/2023   INSULIN  5.8 11/21/2021   Lab Results  Component Value Date   TSH 0.650 01/11/2024   CBC    Component Value Date/Time   WBC 6.8 04/20/2024 0913   WBC 5.8 10/09/2023 0443   RBC 4.61 04/20/2024 0913   HGB 12.6 04/20/2024 0913   HGB 11.9 06/25/2022 1420   HCT 38.9 04/20/2024 0913   HCT 38.5 06/25/2022 1420   PLT 291 04/20/2024 0913   PLT 398 06/25/2022 1420  MCV 84.4 04/20/2024 0913   MCV 81 06/25/2022 1420   MCH 27.3 04/20/2024 0913   MCHC 32.4 04/20/2024 0913   RDW 15.8 (H) 04/20/2024 0913   RDW 13.5 06/25/2022 1420   Iron Studies    Component Value Date/Time   IRON 70 04/20/2024 0913   IRON 24 (L) 06/25/2022 1420   TIBC 375 04/20/2024 0913   TIBC 351 06/25/2022 1420   FERRITIN 269 04/20/2024 0913   FERRITIN 81 06/25/2022 1420   IRONPCTSAT 19 04/20/2024 0913   IRONPCTSAT 7 (LL) 06/25/2022 1420   Lipid Panel     Component Value Date/Time   CHOL 144 09/04/2022 1027   TRIG 52 09/04/2022 1027   HDL 70 09/04/2022 1027   CHOLHDL 2.7 05/02/2021 1158   CHOLHDL 2.9 04/02/2018 1115   VLDL 51 (H) 04/02/2018 1115   LDLCALC 63 09/04/2022 1027   Hepatic Function Panel      Component Value Date/Time   PROT 7.3 12/09/2023 0842   PROT 6.5 03/11/2023 0845   ALBUMIN  4.4 12/09/2023 0842   ALBUMIN  4.4 03/11/2023 0845   AST 15 12/09/2023 0842   ALT 17 12/09/2023 0842   ALKPHOS 73 12/09/2023 0842   BILITOT 0.4 12/09/2023 0842      Component Value Date/Time   TSH 0.650 01/11/2024 1100   Nutritional Lab Results  Component Value Date   VD25OH 78.3 03/11/2023   VD25OH 54.3 09/04/2022   VD25OH 34.2 11/21/2021    Medications: Outpatient Encounter Medications as of 06/21/2024  Medication Sig   Accu-Chek FastClix Lancets MISC Use to check blood sugar up to 7 times a week   atorvastatin  (LIPITOR) 80 MG tablet Take 1 tablet (80 mg total) by mouth daily.   Capsaicin  0.035 % CREA Apply 1 application  topically 2 (two) times daily as needed.   cycloSPORINE  (RESTASIS ) 0.05 % ophthalmic emulsion Place 1 drop into both eyes 2 (two) times daily.   doxepin  (SINEQUAN ) 10 MG capsule Take 1 capsule (10 mg total) by mouth at bedtime as needed for sleep   erythromycin  ophthalmic ointment Apply a small amount into the right eye 3 (three) times daily.   ferrous sulfate  325 (65 FE) MG tablet Take 325 mg by mouth daily with breakfast.   fluorometholone  (FML) 0.1 % ophthalmic suspension Place 1 drop into the right eye 4 (four) times daily.   fluticasone  (FLONASE ) 50 MCG/ACT nasal spray Place 1 spray into both nostrils daily as needed for allergies or rhinitis   glucose blood test strip USE TO CHECK BLOOD GLUCOSE UP TO 7 TIMES A WEEK.   Glycerin -Hypromellose-PEG 400 (DRY EYE RELIEF DROPS) 0.2-0.2-1 % SOLN Place 1 drop into both eyes in the morning and at bedtime.   lamoTRIgine  (LAMICTAL ) 100 MG tablet Take 1 tablet by mouth once a day   linaclotide  (LINZESS ) 145 MCG CAPS capsule Take 1 capsule (145 mcg total) by mouth daily before breakfast. Please keep your October appointment with Dr. Leigh for any further refills. Thank you   Multiple Vitamins-Minerals (MULTIVITAMIN WITH  MINERALS) tablet Take 1 tablet by mouth daily.   ondansetron  (ZOFRAN ) 4 MG tablet Take 1 tablet (4 mg total) by mouth every 6 (six) hours as needed for nausea.   polyethylene glycol (MIRALAX  / GLYCOLAX ) 17 g packet Mix 1 packet (17 g total) in 8 oz clear liquid and drink by mouth daily as needed for mild constipation.   prazosin  (MINIPRESS ) 1 MG capsule Take 1 capsule by mouth at bedtime   triamcinolone  cream (KENALOG )  0.1 % Apply 1 Application topically underneath breast daily as needed.   triamcinolone  ointment (KENALOG ) 0.5 % Apply 1 Application topically to hands daily as needed.   [DISCONTINUED] tirzepatide  (MOUNJARO ) 10 MG/0.5ML Pen Inject 10 mg into the skin once a week.   bacitracin -polymyxin b  (POLYSPORIN ) ophthalmic ointment Apply a small amount into the right eye 3 (three) times daily.   tirzepatide  (MOUNJARO ) 10 MG/0.5ML Pen Inject 10 mg into the skin once a week.   No facility-administered encounter medications on file as of 06/21/2024.     Follow-Up   Return in about 4 weeks (around 07/19/2024) for For Weight Mangement with Dr. Francyne.SABRA She was informed of the importance of frequent follow up visits to maximize her success with intensive lifestyle modifications for her multiple health conditions.  Attestation Statement   Reviewed by clinician on day of visit: allergies, medications, problem list, medical history, surgical history, family history, social history, and previous encounter notes.     Lucas Francyne, MD

## 2024-06-21 NOTE — Assessment & Plan Note (Signed)
 On CPAP with reported good compliance. Continue PAP therapy. Losing 15% or more of body weight may improve AHI.

## 2024-06-22 ENCOUNTER — Other Ambulatory Visit: Payer: Self-pay

## 2024-06-22 ENCOUNTER — Other Ambulatory Visit (HOSPITAL_COMMUNITY): Payer: Self-pay

## 2024-06-22 ENCOUNTER — Encounter: Payer: Self-pay | Admitting: Nurse Practitioner

## 2024-06-22 DIAGNOSIS — E1169 Type 2 diabetes mellitus with other specified complication: Secondary | ICD-10-CM | POA: Diagnosis not present

## 2024-06-22 DIAGNOSIS — H04123 Dry eye syndrome of bilateral lacrimal glands: Secondary | ICD-10-CM | POA: Diagnosis not present

## 2024-06-22 DIAGNOSIS — E119 Type 2 diabetes mellitus without complications: Secondary | ICD-10-CM | POA: Diagnosis not present

## 2024-06-22 DIAGNOSIS — J309 Allergic rhinitis, unspecified: Secondary | ICD-10-CM | POA: Diagnosis not present

## 2024-06-22 DIAGNOSIS — K5900 Constipation, unspecified: Secondary | ICD-10-CM | POA: Diagnosis not present

## 2024-06-22 MED ORDER — FEXOFENADINE HCL 180 MG PO TABS
180.0000 mg | ORAL_TABLET | Freq: Every day | ORAL | 6 refills | Status: AC
  Filled 2024-06-22: qty 30, 30d supply, fill #0
  Filled 2024-09-08: qty 30, 30d supply, fill #1

## 2024-06-28 NOTE — Progress Notes (Unsigned)
 HPI : seen 15/4461 63 year old female here for a follow-up visit for bowel issues, anemia.  She was last seen in November 2022 for constipation, abdominal pain, GERD, also had a history of H. pylori.  She has had that treated in the past with eradication.   Recall she had problems with bloating and constipation in the past.  She had strong related symptoms to beef and pork in the past, did serologic workup for alpha gal which was negative.  Had placed her on a bowel regimen with Linzess  and she states that has been working fairly well for her.  She is pretty happy with the regimen of Linzess  currently and wants to continue with that.  One of the main issues that bothers her is the sense of incomplete evacuation when she uses her bowels.  She has to strain a lot and place a finger outside of her rectal area to push into her rectum from the outside as she feels that that will help her evacuate.  Denies a clear bulge in that area or prolapse of her rectum or vagina.  She inquires about what she can do for this.   She reminds me that she had hip replacement this past September 2023.  During that time her hemoglobin dropped to 8s as it has in the past with her orthopedic surgeries (she also had 2 additional knee surgeries earlier in the year).  She had some blood work done following that hospitalization and noted to have a mild iron deficiency.  She was placed on oral iron and her anemia and iron studies have completely resolved as of December.  She denies any overt blood in her stool at all.  Her stools are a bit dark from iron use.  Recall she had an EGD and colonoscopy in May 2022, treated for H. pylori, no high risk lesions.  She is status post hysterectomy.  She was seen by hematology, they felt iron deficiency likely due to repeated orthopedic surgeries with blood loss.  Of note she has been taking meloxicam  daily for joint pains and not on any GI prophylaxis.  She states she is recovering from those  operations and now more active and able to exercise more.  Recall she has had numerous CT scans in the past few years for evaluation of some of her abdominal symptoms.  Last CT scan July 2023 which was negative.       EGD 01/11/21 -  - The exam of the esophagus was otherwise normal. - The entire examined stomach was normal. Biopsies were taken with a cold forceps for Helicobacter pylori testing. - Diffuse nodular mucosa was found in the duodenal bulb. Biopsies were taken with a cold forceps for histology. - The exam of the duodenum was otherwise normal.   Colonoscopy 01/11/21 - The perianal and digital rectal examinations were normal. - The terminal ileum appeared normal. - Many medium-mouthed diverticula were found in the entire colon (highest burden in ascending colon). - A diminutive polyp was found in the recto-sigmoid colon. The polyp was sessile. The polyp was removed with a cold snare. Resection and retrieval were complete. - A 3 mm polyp was found in the rectum. The polyp was sessile. The polyp was removed with a cold snare. Resection and retrieval were complete. - Internal hemorrhoids were found during retroflexion. - The exam was otherwise without abnormality.   1. Surgical [P], duodenal nodules - PEPTIC DUODENITIS. - BRUNNER GLAND HYPERPLASIA. 2. Surgical [P], gastric antrum and gastric body -  CHRONIC ACTIVE GASTRITIS WITH HELICOBACTER PYLORI [WARTHIN-STARRY STAIN PERFORMED]. 3. Surgical [P], colon, rectum, recto-sigmoid, polyp (2) - TUBULAR ADENOMA, NEGATIVE FOR HIGH GRADE DYSPLASIA (X1). - HYPERPLASTIC POLYP (X1).     She was treated for H. pylori with 14 days worth of Amoxicillin  1gm BID, Clarithromycin  500mg  BID Flagyl  500mg  BID, too her protonix  40mg  BID for 14 days.  She states she tolerated this but it was very difficult on her stomach but she did complete the full therapy.  She had a H pylori stool antigen 03/22/21 - negative.       Prior work-up: CT abdomen /  pelvis 11/16/20 - IMPRESSION: 1. No acute findings in the abdomen or pelvis. Specifically, no findings to explain the patient's history of left lower quadrant pain with nausea and vomiting. 2. Diffuse colonic diverticulosis without diverticulitis. 3. Aortic Atherosclerosis (ICD10-I70.0).   CT renal stone study 04/29/21 IMPRESSION: Extensive colonic diverticulosis without evidence of diverticulitis. No acute intra-abdominal or intrapelvic abnormalities. Aortic Atherosclerosis (ICD10-I70.0).   CT abdomen / pelvis with contrast 05/26/21: IMPRESSION: 1. No suspicious CT etiology for LEFT flank pain identified. 2. Moderate to large colonic stool burden.   CT scan abdomen / pelvis 03/18/22: IMPRESSION: No acute abnormality. Colonic diverticulosis.    63 y.o. female here for assessment of the following   1. Chronic constipation   2. Anemia, unspecified type     Generally doing much better on Linzess , bowels are more regular and pain improved although she does have some straining at times and needs to use manual maneuvers to help push out stool.  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  We discussed what this is.  I am going to refer her to pelvic floor PT for their evaluation, treatment for possible dyssynergia initially and see if that helps.  She will continue Linzess  at this time, refilled it.   We discussed her iron deficiency in the past.  Agree with her endoscopic evaluation recently, no overt GI blood loss, this could be due to her orthopedic surgeries.  However also in the setting of routine use of meloxicam  at risk for gastritis or PUD.  Recommend she stop using NSAIDs routinely and use Tylenol  for joint pains as preferred modality.  She agreed.  Unclear if she really needs long-term iron at this point given her CBC and iron studies have resolved.  I think reasonable to stop it and repeat her blood  work in 3 months to assess for any recurrent iron deficiency.  If she does have recurrent iron deficiency off NSAIDs and no further orthopedic issues, may need to consider further endoscopic evaluation.  She agrees.     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 - stop iron for now - stop routine use of NSAIDs - use tylenol  for joint pains as preferred modality - recall in for CBC and TIBC / ferritin in 3 months to see if she has recurrent IDA   Marcey Naval, MD [facility]   Lab Results  Component Value Date   WBC 6.8 04/20/2024   HGB 12.6 04/20/2024   HCT 38.9 04/20/2024   MCV 84.4 04/20/2024   PLT 291 04/20/2024    Lab Results  Component Value Date   IRON 70 04/20/2024   TIBC 375 04/20/2024   FERRITIN 269 04/20/2024       Past Medical History:  Diagnosis Date  Anemia    Anxiety    Arthritis    Back pain    Bipolar depression (HCC) 09/2015   Chronic knee pain after total replacement of right knee joint 10/02/2021   Constipation    Depression 06/27/2021   Dizziness    Generalized abdominal discomfort 05/06/2021   History of colon polyps    History of kidney stones    History of patellar fracture 2016   History of tibial fracture 2016   Hyperlipidemia    IBS (irritable bowel syndrome)    Joint pain    Night terrors    Night terrors, adult 09/2015   OSA (obstructive sleep apnea) 04/07/2022   Osteoarthritis    Pain in joint of right shoulder 09/16/2021   Pain of right knee after injury 03/10/2018   Post concussion syndrome 12/20/2019   S/P hardware removal, right tibia 09/17/2021   S/P total knee arthroplasty, 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   Seasonal allergies 11/09/2020   Sleep apnea    uses cpap   Type 2 diabetes mellitus (HCC)    Vaginal atrophy 11/09/2020   Vitamin B 12 deficiency    Vitamin D  deficiency      Past Surgical History:  Procedure  Laterality Date   ABDOMINAL HYSTERECTOMY     CHOLECYSTECTOMY     CLOSED REDUCTION TIBIAL FRACTURE Right 2003   COL[facility]COPY  2016   [facility]    HARDWARE REMOVAL Right 09/17/2021   Procedure: Removal of hardware from right tibia;  Surgeon: Ernie Cough, MD;  Location: [facility];  Service: Orthopedics;  Laterality: Right;   MANDIBLE SURGERY  2022   TOTAL HIP ARTHROPLASTY Left 05/13/2022   Procedure: TOTAL HIP ARTHROPLASTY ANTERIOR APPROACH;  Surgeon: Ernie Cough, MD;  Location: [facility];  Service: Orthopedics;  Laterality: Left;   TOTAL KNEE ARTHROPLASTY Right 10/17/2021   Procedure: TOTAL KNEE ARTHROPLASTY;  Surgeon: Ernie Cough, MD;  Location: [facility];  Service: Orthopedics;  Laterality: Right;   WISDOM TOOTH EXTRACTION     Family History  Problem Relation Age of Onset   Hyperlipidemia Mother    Hypertension Mother    Colon polyps Mother    Heart disease Mother    Diabetes Mother    Depression Mother    Anxiety disorder Mother    Bipolar disorder Mother    Eating disorder Mother    Obesity Mother    Arthritis/Rheumatoid Father    Hyperlipidemia Maternal Aunt    Hypertension Maternal Aunt    Cancer Maternal Aunt        blood cancer   Colon polyps Maternal Grandmother    Stroke Other    Colon cancer Neg Hx    Esophageal cancer Neg Hx    Stomach cancer Neg Hx    Rectal cancer Neg Hx    Sleep apnea Neg Hx    Breast cancer Neg Hx    Social History   Tobacco Use   Smoking status: Former    Current packs/day: 0.00    Average packs/day: 0.3 packs/day for 7.0 years (1.8 ttl pk-yrs)    Types: Cigarettes    Start date: 12/22/2010    Quit date: 12/21/2017    Years since quitting: 6.5   Smokeless tobacco: Never  Vaping Use   Vaping status: Never Used  Substance Use Topics   Alcohol  use: Yes    Comment: special occasions   Drug use: No   Current Outpatient Medications  Medication Sig Dispense Refill  Accu-Chek FastClix Lancets MISC Use to check blood sugar up to 7 times  a week 102 each 3   acetaminophen -codeine  (TYLENOL  #3) 300-30 MG tablet Take 1 tablet by mouth 2 (two) times daily as needed for pain. 30 tablet 0   atorvastatin  (LIPITOR) 80 MG tablet Take 1 tablet (80 mg total) by mouth daily. 30 tablet 11   Capsaicin  0.035 % CREA Apply 1 application  topically 2 (two) times daily as needed. 42.5 g 0   cycloSPORINE  (RESTASIS ) 0.05 % ophthalmic emulsion Place 1 drop into both eyes 2 (two) times daily. 180 each 3   doxepin  (SINEQUAN ) 10 MG capsule Take 1 capsule (10 mg total) by mouth at bedtime as needed for sleep 30 capsule 2   erythromycin  ophthalmic ointment Apply a small amount into the right eye 3 (three) times daily. 3.5 g 0   ferrous sulfate  325 (65 FE) MG tablet Take 325 mg by mouth daily with breakfast.     fexofenadine (ALLEGRA) 180 MG tablet Take 1 tablet (180 mg total) by mouth daily. Swallow whole with water . Do not take with fruit juices 30 tablet 6   fluorometholone  (FML) 0.1 % ophthalmic suspension Place 1 drop into the right eye 4 (four) times daily. 5 mL 0   fluticasone  (FLONASE ) 50 MCG/ACT nasal spray Place 1 spray into both nostrils daily as needed for allergies or rhinitis 16 g 2   glucose blood test strip USE TO CHECK BLOOD GLUCOSE UP TO 7 TIMES A WEEK. 100 strip 3   Glycerin -Hypromellose-PEG 400 (DRY EYE RELIEF DROPS) 0.2-0.2-1 % SOLN Place 1 drop into both eyes in the morning and at bedtime.     lamoTRIgine  (LAMICTAL ) 100 MG tablet Take 1 tablet by mouth once a day 30 tablet 2   linaclotide  (LINZESS ) 145 MCG CAPS capsule Take 1 capsule (145 mcg total) by mouth daily before breakfast. Please keep your October appointment with Dr. Leigh for any further refills. Thank you 30 capsule 2   Multiple Vitamins-Minerals (MULTIVITAMIN WITH MINERALS) tablet Take 1 tablet by mouth daily.     ondansetron  (ZOFRAN ) 4 MG tablet Take 1 tablet (4 mg total) by mouth every 6 (six) hours as needed for nausea. 20 tablet 0   polyethylene glycol (MIRALAX  /  GLYCOLAX ) 17 g packet Mix 1 packet (17 g total) in 8 oz clear liquid and drink by mouth daily as needed for mild constipation. 14 each 0   prazosin  (MINIPRESS ) 1 MG capsule Take 1 capsule by mouth at bedtime 30 capsule 2   tirzepatide  (MOUNJARO ) 10 MG/0.5ML Pen Inject 10 mg into the skin once a week. 2 mL 0   triamcinolone  cream (KENALOG ) 0.1 % Apply 1 Application topically underneath breast daily as needed. 45 g 2   triamcinolone  ointment (KENALOG ) 0.5 % Apply 1 Application topically to hands daily as needed. 30 g 2   No current facility-administered medications for this visit.   Allergies  Allergen Reactions   Aspirin      Upsets stomach in large doses   Depakote [Divalproex Sodium] Other (See Comments)    Hair fell out   Ibuprofen Nausea And Vomiting   Prednisone  Other (See Comments)    Constipation, nervousness All steroids   Latex Rash     Review of Systems: All systems reviewed and negative except where noted in HPI.    No results found.  Physical Exam: There were no vitals taken for this visit. Constitutional: Pleasant,well-developed, ***female in no acute distress. HEENT: Normocephalic and atraumatic.  Conjunctivae are normal. No scleral icterus. Neck supple.  Cardiovascular: Normal rate, regular rhythm.  Pulmonary/chest: Effort normal and breath sounds normal. No wheezing, rales or rhonchi. Abdominal: Soft, nondistended, nontender. Bowel sounds active throughout. There are no masses palpable. No hepatomegaly. Extremities: no edema Lymphadenopathy: No cervical adenopathy noted. Neurological: Alert and oriented to person place and time. Skin: Skin is warm and dry. No rashes noted. Psychiatric: Normal mood and affect. Behavior is normal.   ASSESSMENT: 63 y.o. female here for assessment of the following  No diagnosis found.  PLAN:   Marylu Gee, DO

## 2024-06-29 ENCOUNTER — Ambulatory Visit: Admitting: Gastroenterology

## 2024-06-29 ENCOUNTER — Other Ambulatory Visit (HOSPITAL_COMMUNITY): Payer: Self-pay

## 2024-06-29 ENCOUNTER — Encounter: Payer: Self-pay | Admitting: Gastroenterology

## 2024-06-29 ENCOUNTER — Ambulatory Visit (INDEPENDENT_AMBULATORY_CARE_PROVIDER_SITE_OTHER): Admitting: Internal Medicine

## 2024-06-29 VITALS — BP 124/74 | HR 90 | Ht 62.0 in | Wt 181.1 lb

## 2024-06-29 DIAGNOSIS — Z860100 Personal history of colon polyps, unspecified: Secondary | ICD-10-CM | POA: Diagnosis not present

## 2024-06-29 DIAGNOSIS — Z8639 Personal history of other endocrine, nutritional and metabolic disease: Secondary | ICD-10-CM | POA: Diagnosis not present

## 2024-06-29 DIAGNOSIS — K5909 Other constipation: Secondary | ICD-10-CM | POA: Diagnosis not present

## 2024-06-29 MED ORDER — LINACLOTIDE 145 MCG PO CAPS
145.0000 ug | ORAL_CAPSULE | Freq: Every day | ORAL | 3 refills | Status: AC
  Filled 2024-06-29 – 2024-07-12 (×2): qty 90, 90d supply, fill #0

## 2024-06-29 NOTE — Patient Instructions (Addendum)
 We have sent the following medications to your pharmacy for you to pick up at your convenience: Linzess  145 mcg - Take 30 minutes before a meal once a day  You will be due for a recall colonoscopy in 12-2027. We will send you a reminder in the mail when it gets closer to that time.  Follow up as needed.   Thank you for entrusting me with your care and for choosing [facility], Dr. Elspeth Naval     _______________________________________________________  If your blood pressure at your visit was 140/90 or greater, please contact your primary care physician to follow up on this.  _______________________________________________________  If you are age 69 or older, your body mass index should be between 23-30. Your Body mass index is 33.13 kg/m. If this is out of the aforementioned range listed, please consider follow up with your Primary Care Provider.  If you are age 59 or younger, your body mass index should be between 19-25. Your Body mass index is 33.13 kg/m. If this is out of the aformentioned range listed, please consider follow up with your Primary Care Provider.   ________________________________________________________  The  GI providers would like to encourage you to use MYCHART to communicate with providers for non-urgent requests or questions.  Due to long hold times on the telephone, sending your provider a message by [facility] may be a faster and more efficient way to get a response.  Please allow 48 business hours for a response.  Please remember that this is for non-urgent requests.  _______________________________________________________  [facility] is using a team-based approach to care.  Your team is made up of your doctor and two to three APPS. Our APPS (Nurse Practitioners and Physician Assistants) work with your physician to ensure care continuity for you. They are fully qualified to address your health concerns and develop a treatment  plan. They communicate directly with your gastroenterologist to care for you. Seeing the Advanced Practice Practitioners on your physician's team can help you by facilitating care more promptly, often allowing for earlier appointments, access to diagnostic testing, procedures, and other specialty referrals.

## 2024-07-01 ENCOUNTER — Encounter (INDEPENDENT_AMBULATORY_CARE_PROVIDER_SITE_OTHER): Payer: Self-pay

## 2024-07-07 ENCOUNTER — Other Ambulatory Visit (HOSPITAL_COMMUNITY): Payer: Self-pay

## 2024-07-07 MED ORDER — RESTASIS 0.05 % OP EMUL
1.0000 [drp] | Freq: Two times a day (BID) | OPHTHALMIC | 3 refills | Status: AC
  Filled 2024-07-07 – 2024-07-12 (×2): qty 180, 90d supply, fill #0
  Filled 2024-08-28 – 2024-09-08 (×2): qty 180, 90d supply, fill #1

## 2024-07-08 ENCOUNTER — Other Ambulatory Visit (HOSPITAL_COMMUNITY): Payer: Self-pay

## 2024-07-11 ENCOUNTER — Encounter: Payer: Self-pay | Admitting: Nurse Practitioner

## 2024-07-12 ENCOUNTER — Other Ambulatory Visit (HOSPITAL_COMMUNITY): Payer: Self-pay

## 2024-07-18 NOTE — Progress Notes (Unsigned)
 SUBJECTIVE: Discussed the use of AI scribe software for clinical note transcription with the patient, who gave verbal consent to proceed.  Chief Complaint: Obesity  Interim History: She is down 5 lbs since her last visit.  Down 23 lbs overall TBW Loss of 11.8%  Down 35 lbs from peak weight TBW loss from peak weight 16.9%  [name] is here to discuss her progress with her obesity treatment plan. She is on the keeping a food journal and adhering to recommended goals of 1000 calories and 80 grams of protein and states she is following her eating plan approximately 90 % of the time. She states she is exercising Gym 90 minutes 3 times per week. Meghan Wilson is a 63 year old female who presents for follow-up of her obesity treatment plan.  She has been on Mounjaro  10 mg once weekly for type 2 diabetes management, which has also aided in her weight loss. She has lost 21 pounds, now weighing 172 pounds, down from 177 pounds at her last visit. She engages in regular physical activity, including strength training, but has had to limit this due to arm pain from calcium  buildup, which was treated with in-office scraping. Despite this, she continues to walk regularly.  Mounjaro  has significantly reduced her cravings, although she experiences some hunger in the mornings, which she manages with protein shakes, egg whites, or high-protein cereal. She maintains a structured eating schedule, avoiding food after 6 PM, and has cut out ice cream and other high-sugar foods, opting for fruit and nuts instead. She occasionally craves bread but limits her intake.  Her past medical history includes type 2 diabetes, hyperlipidemia managed with atorvastatin  80 mg daily, obstructive sleep apnea managed with CPAP, and vitamin D  deficiency. She takes Linzess  and Miralax  for constipation associated with irritable bowel syndrome, and prazosin  1 mg at bedtime.  She recently received her pneumonia and flu shots and plans to get  her COVID shot soon. Her A1c has improved to 5.5 from previous higher levels. She is mindful of her diet, avoiding foods that contribute to fat retention, and has made significant lifestyle changes to support her health goals.  No nausea, vomiting, constipation, or diarrhea. Reports arm pain due to calcium  buildup, treated with in-office scraping.  OBJECTIVE: Visit Diagnoses: Problem List Items Addressed This Visit     Hyperlipidemia   Type 2 diabetes mellitus in patient with obesity (HCC) - Primary   Relevant Medications   tirzepatide  (MOUNJARO ) 12.5 MG/0.5ML Pen   Vitamin D  deficiency   OSA on CPAP   Obesity: Initial BMI 35   Relevant Medications   tirzepatide  (MOUNJARO ) 12.5 MG/0.5ML Pen   Other Visit Diagnoses       BMI 31.0-31.9,adult Current BMI 31.6         Obesity Management is ongoing with significant weight loss of 23 pounds, primarily from adipose tissue. Muscle mass has decreased slightly due to limitations in strength training with recent shoulder issues.  Current regimen includes Mounjaro , which has helped reduce cravings, but she has noticed an increase in cravings lately and some increased hunger.  She is engaging in regular physical activity, including walking and gym workouts, though strength training is limited due to calcium  buildup in arms. - Increased Mounjaro  to 12.5 mg weekly for primary indication of T2DM but also medical weight loss- to further manage cravings and support weight loss. - Continue regular physical activity, focusing on walking and maintaining gym workouts as tolerated. - Encouraged dietary modifications, including scheduled meals  and high-protein snacks, to manage hunger and prevent overeating.  Type 2 diabetes mellitus Type 2 diabetes is well-controlled with an A1c of 5.5, improved from previous levels of 6.1-6.3. Mounjaro  has been effective in managing blood glucose levels and reducing cravings but notes some increase in cravings and hunger  lately.  Has been on Mounjaro  10 mg weekly x 3 months.  Denies mass in neck, dysphagia, dyspepsia, persistent hoarseness, abdominal pain, or N/V or diarrhea. Has annual eye exam. Mood is stable.  Has chronic constipation and under GI care and reports no issues on currently regimen of Linzess  and miralax .  Lab Results  Component Value Date   HGBA1C 5.7 (A) 01/11/2024   HGBA1C 5.5 09/10/2023   HGBA1C 5.9 (A) 07/14/2023   Lab Results  Component Value Date   LDLCALC 63 09/04/2022   CREATININE 0.76 12/09/2023   She is working  on nutrition plan to decrease simple carbohydrates, increase lean proteins and exercise to promote weight loss and improve glycemic control . - Continue Mounjaro  at increased dose of 12.5 mg weekly and will monitor response closely over the next 4 weeks.   Hyperlipidemia Managed with atorvastatin  80 mg daily. No reported SE.  Last lipids Lab Results  Component Value Date   CHOL 144 09/04/2022   HDL 70 09/04/2022   LDLCALC 63 09/04/2022   TRIG 52 09/04/2022   CHOLHDL 2.7 05/02/2021   The 10-year ASCVD risk score (Arnett DK, et al., 2019) is: 12.5%   Values used to calculate the score:     Age: 2 years     Clincally relevant sex: Female     Is Non-Hispanic African American: Yes     Diabetic: Yes     Tobacco smoker: No     Systolic Blood Pressure: 123 mmHg     Is BP treated: Yes     HDL Cholesterol: 70 mg/dL     Total Cholesterol: 144 mg/dL - Continue Mounjaro  to also decrease CV risks - Continue atorvastatin  80 mg daily. Continue to work on nutrition plan -decreasing simple carbohydrates, increasing lean proteins, decreasing saturated fats and cholesterol , avoiding trans fats and exercise as able to promote weight loss, improve lipids and decrease cardiovascular risks.  Irritable bowel syndrome with constipation Irritable bowel syndrome with constipation is managed with Linzess  and Miralax . Recent evaluation by her GI doctor showed good results with  current medications. Discussed that Mounjaro  may also contribute to constipation and to monitor closely as dose of Mounjaro  is increased. - Continue Linzess  and Miralax  as needed for constipation management.  General Health Maintenance She has received pneumonia and flu vaccinations and plans to receive the COVID vaccine. She is maintaining a healthy lifestyle with dietary modifications and regular physical activity. - Continue routine health maintenance, including vaccinations and lifestyle modifications.  Vitals Temp: 97.6 F (36.4 C) BP: 123/76 Pulse Rate: 89 SpO2: 97 %   Anthropometric Measurements Height: 5' 2 (1.575 m) Weight: 172 lb (78 kg) BMI (Calculated): 31.45 Weight at Last Visit: 175 lb Weight Lost Since Last Visit: 3 lb Weight Gained Since Last Visit: 0 Starting Weight: 195 lb Total Weight Loss (lbs): 21 lb (9.526 kg) Peak Weight: 207 lb   Body Composition  Body Fat %: 45.1 % Fat Mass (lbs): 77.8 lbs Muscle Mass (lbs): 90 lbs Total Body Water  (lbs): 64.4 lbs Visceral Fat Rating : 12   Other Clinical Data Fasting: No Labs: No Today's Visit #: 38 Starting Date: 11/01/21     ASSESSMENT AND PLAN:  Diet: Azyiah is currently in the action stage of change. As such, her goal is to continue with weight loss efforts. She has agreed to keeping a food journal and adhering to recommended goals of 1000 calories and 80 grams of protein.  Exercise: Senaya has been instructed to continue exercising as is for weight loss and overall health benefits.   Behavior Modification:  We discussed the following Behavioral Modification Strategies today: increasing lean protein intake, decreasing simple carbohydrates, increasing vegetables, increase H2O intake, increase high fiber foods, no skipping meals, meal planning and cooking strategies, holiday eating strategies, avoiding temptations, planning for success, and keep a strict food journal. We discussed various medication  options to help Carma with her weight loss efforts and we both agreed to increase Mounjaro  to 12.5 mg weekly and monitor closely following increase of Mounjaro .  Return in about 4 weeks (around 08/16/2024).SABRA She was informed of the importance of frequent follow up visits to maximize her success with intensive lifestyle modifications for her multiple health conditions.  Attestation Statements:   Reviewed by clinician on day of visit: allergies, medications, problem list, medical history, surgical history, family history, social history, and previous encounter notes.   Time spent on visit including pre-visit chart review and post-visit care and charting was 36 minutes.    Righteous Claiborne, PA-C

## 2024-07-19 ENCOUNTER — Ambulatory Visit (INDEPENDENT_AMBULATORY_CARE_PROVIDER_SITE_OTHER): Payer: Self-pay | Admitting: Physician Assistant

## 2024-07-19 ENCOUNTER — Other Ambulatory Visit (HOSPITAL_COMMUNITY): Payer: Self-pay

## 2024-07-19 ENCOUNTER — Encounter (INDEPENDENT_AMBULATORY_CARE_PROVIDER_SITE_OTHER): Payer: Self-pay | Admitting: Physician Assistant

## 2024-07-19 VITALS — BP 123/76 | HR 89 | Temp 97.6°F | Ht 62.0 in | Wt 172.0 lb

## 2024-07-19 DIAGNOSIS — E559 Vitamin D deficiency, unspecified: Secondary | ICD-10-CM

## 2024-07-19 DIAGNOSIS — E785 Hyperlipidemia, unspecified: Secondary | ICD-10-CM | POA: Diagnosis not present

## 2024-07-19 DIAGNOSIS — K581 Irritable bowel syndrome with constipation: Secondary | ICD-10-CM

## 2024-07-19 DIAGNOSIS — G4733 Obstructive sleep apnea (adult) (pediatric): Secondary | ICD-10-CM

## 2024-07-19 DIAGNOSIS — E669 Obesity, unspecified: Secondary | ICD-10-CM

## 2024-07-19 DIAGNOSIS — E119 Type 2 diabetes mellitus without complications: Secondary | ICD-10-CM

## 2024-07-19 DIAGNOSIS — Z7985 Long-term (current) use of injectable non-insulin antidiabetic drugs: Secondary | ICD-10-CM

## 2024-07-19 DIAGNOSIS — E78019 Familial hypercholesterolemia, unspecified: Secondary | ICD-10-CM

## 2024-07-19 DIAGNOSIS — Z6831 Body mass index (BMI) 31.0-31.9, adult: Secondary | ICD-10-CM

## 2024-07-19 MED ORDER — TIRZEPATIDE 12.5 MG/0.5ML ~~LOC~~ SOAJ
12.5000 mg | SUBCUTANEOUS | 2 refills | Status: DC
  Filled 2024-07-19: qty 2, 28d supply, fill #0
  Filled 2024-08-11: qty 2, 28d supply, fill #1

## 2024-07-25 ENCOUNTER — Ambulatory Visit: Admitting: Podiatry

## 2024-07-25 ENCOUNTER — Other Ambulatory Visit (HOSPITAL_COMMUNITY): Payer: Self-pay

## 2024-07-25 ENCOUNTER — Ambulatory Visit (INDEPENDENT_AMBULATORY_CARE_PROVIDER_SITE_OTHER): Admitting: Podiatry

## 2024-07-25 DIAGNOSIS — M722 Plantar fascial fibromatosis: Secondary | ICD-10-CM | POA: Diagnosis not present

## 2024-07-25 DIAGNOSIS — M21619 Bunion of unspecified foot: Secondary | ICD-10-CM | POA: Diagnosis not present

## 2024-07-25 DIAGNOSIS — L600 Ingrowing nail: Secondary | ICD-10-CM

## 2024-07-25 MED ORDER — TRIAMCINOLONE ACETONIDE 10 MG/ML IJ SUSP
10.0000 mg | Freq: Once | INTRAMUSCULAR | Status: AC
  Administered 2024-07-25: 10 mg via INTRA_ARTICULAR

## 2024-07-25 MED ORDER — TRAMADOL HCL 50 MG PO TABS
50.0000 mg | ORAL_TABLET | Freq: Four times a day (QID) | ORAL | 0 refills | Status: DC | PRN
  Filled 2024-07-25: qty 28, 7d supply, fill #0

## 2024-07-27 ENCOUNTER — Ambulatory Visit (INDEPENDENT_AMBULATORY_CARE_PROVIDER_SITE_OTHER): Admitting: Otolaryngology

## 2024-07-27 ENCOUNTER — Other Ambulatory Visit (HOSPITAL_COMMUNITY): Payer: Self-pay

## 2024-07-27 ENCOUNTER — Encounter (INDEPENDENT_AMBULATORY_CARE_PROVIDER_SITE_OTHER): Payer: Self-pay | Admitting: Otolaryngology

## 2024-07-27 ENCOUNTER — Other Ambulatory Visit: Payer: Self-pay

## 2024-07-27 VITALS — BP 112/72 | HR 100 | Temp 98.3°F | Ht 62.0 in | Wt 171.0 lb

## 2024-07-27 DIAGNOSIS — H6993 Unspecified Eustachian tube disorder, bilateral: Secondary | ICD-10-CM

## 2024-07-27 DIAGNOSIS — K219 Gastro-esophageal reflux disease without esophagitis: Secondary | ICD-10-CM

## 2024-07-27 DIAGNOSIS — L739 Follicular disorder, unspecified: Secondary | ICD-10-CM

## 2024-07-27 DIAGNOSIS — J310 Chronic rhinitis: Secondary | ICD-10-CM

## 2024-07-27 DIAGNOSIS — J3089 Other allergic rhinitis: Secondary | ICD-10-CM

## 2024-07-27 DIAGNOSIS — S0031XA Abrasion of nose, initial encounter: Secondary | ICD-10-CM

## 2024-07-27 DIAGNOSIS — T7840XD Allergy, unspecified, subsequent encounter: Secondary | ICD-10-CM

## 2024-07-27 MED ORDER — CEPHALEXIN 500 MG PO CAPS
500.0000 mg | ORAL_CAPSULE | Freq: Three times a day (TID) | ORAL | 0 refills | Status: DC
  Filled 2024-07-27: qty 30, 10d supply, fill #0

## 2024-07-27 MED ORDER — FLUTICASONE PROPIONATE 50 MCG/ACT NA SUSP
2.0000 | Freq: Every day | NASAL | 6 refills | Status: AC
  Filled 2024-07-27: qty 16, fill #0
  Filled 2024-08-11 – 2024-08-12 (×2): qty 16, 30d supply, fill #0

## 2024-07-27 MED ORDER — MUPIROCIN CALCIUM 2 % EX CREA
1.0000 | TOPICAL_CREAM | Freq: Two times a day (BID) | CUTANEOUS | 0 refills | Status: AC
  Filled 2024-07-27: qty 15, 14d supply, fill #0

## 2024-07-27 NOTE — Progress Notes (Signed)
 Reason for Consult: Sinusitis Referring Physician: Dr. [name] is an 63 y.o. female.  HPI: She is here for evaluation of a nasal lesion/crusting on the left vestibule.  She has had this since the beginning of the year.  It continues to cause her irritation and sometimes pain.  She has not had this prior.  She has a lot of nasal congestion and postnasal drip.  The drainage is clear.  She has not had any purulent drainage coming from her nose.  No facial pressure or pain.  She has a little bit of pressure feeling in both ears that is intermittent.  There is no hearing loss, drainage, or vertigo.  She does have bilateral tinnitus.  This is unchanged for years.  Past Medical History:  Diagnosis Date   Anemia    Anxiety    Arthritis    Back pain    Bipolar depression (HCC) 09/2015   Chronic knee pain after total replacement of right knee joint 10/02/2021   Constipation    Depression 06/27/2021   Dizziness    Generalized abdominal discomfort 05/06/2021   History of colon polyps    History of kidney stones    History of patellar fracture 2016   History of tibial fracture 2016   Hyperlipidemia    IBS (irritable bowel syndrome)    Joint pain    Night terrors    Night terrors, adult 09/2015   OSA (obstructive sleep apnea) 04/07/2022   Osteoarthritis    Pain in joint of right shoulder 09/16/2021   Pain of right knee after injury 03/10/2018   Post concussion syndrome 12/20/2019   S/P hardware removal, right tibia 09/17/2021   S/P total knee arthroplasty, 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   Seasonal allergies 11/09/2020   Sleep apnea    uses cpap   Type 2 diabetes mellitus (HCC)    Vaginal atrophy 11/09/2020   Vitamin B 12 deficiency    Vitamin D  deficiency     Past Surgical History:  Procedure Laterality Date   ABDOMINAL HYSTERECTOMY     CHOLECYSTECTOMY     CLOSED REDUCTION TIBIAL FRACTURE Right 2003   COLONOSCOPY  2016   [facility]     HARDWARE REMOVAL Right 09/17/2021   Procedure: Removal of hardware from right tibia;  Surgeon: Ernie Cough, MD;  Location: [facility];  Service: Orthopedics;  Laterality: Right;   MANDIBLE SURGERY  2022   TOTAL HIP ARTHROPLASTY Left 05/13/2022   Procedure: TOTAL HIP ARTHROPLASTY ANTERIOR APPROACH;  Surgeon: Ernie Cough, MD;  Location: [facility];  Service: Orthopedics;  Laterality: Left;   TOTAL KNEE ARTHROPLASTY Right 10/17/2021   Procedure: TOTAL KNEE ARTHROPLASTY;  Surgeon: Ernie Cough, MD;  Location: [facility];  Service: Orthopedics;  Laterality: Right;   WISDOM TOOTH EXTRACTION      Family History  Problem Relation Age of Onset   Hyperlipidemia Mother    Hypertension Mother    Colon polyps Mother    Heart disease Mother    Diabetes Mother    Depression Mother    Anxiety disorder Mother    Bipolar disorder Mother    Eating disorder Mother    Obesity Mother    Arthritis/Rheumatoid Father    Hyperlipidemia Maternal Aunt    Hypertension Maternal Aunt    Cancer Maternal Aunt        blood cancer   Colon polyps Maternal Grandmother    Stroke Other    Colon cancer Neg Hx  Esophageal cancer Neg Hx    Stomach cancer Neg Hx    Rectal cancer Neg Hx    Sleep apnea Neg Hx    Breast cancer Neg Hx     Social History:  reports that she quit smoking about 6 years ago. Her smoking use included cigarettes. She started smoking about 13 years ago. She has a 1.8 pack-year smoking history. She has never used smokeless tobacco. She reports current alcohol  use. She reports that she does not use drugs.  Allergies:  Allergies  Allergen Reactions   Aspirin      Upsets stomach in large doses   Depakote [Divalproex Sodium] Other (See Comments)    Hair fell out   Ibuprofen Nausea And Vomiting   Prednisone  Other (See Comments)    Constipation, nervousness All steroids   Latex Rash    Medications: I have reviewed the patient's current medications.  No results found for this or any previous  visit (from the past 48 hours).  No results found.  ROS Blood pressure 112/72, pulse 100, temperature 98.3 F (36.8 C), temperature source Oral, height 5' 2 (1.575 m), weight 171 lb (77.6 kg), SpO2 98%. Physical Exam Constitutional:      Appearance: Normal appearance.  HENT:     Head: Normocephalic and atraumatic.     Right Ear: Tympanic membrane is without lesions and middle ear aerated, ear canal and external ear normal.     Left Ear: Tympanic membrane is without lesions and middle ear aerated, ear canal and external ear normal.     Nose: Nose normal. Turbinates with mild hypertrophy, No significant swelling or masses.     Oral cavity/oropharynx: Mucous membranes are moist. No lesions or masses    Larynx: normal voice. Mirror attempted without success    Eyes:     Extraocular Movements: Extraocular movements intact.     Conjunctiva/sclera: Conjunctivae normal.     Pupils: Pupils are equal, round, and reactive to light.  Cardiovascular:     Rate and Rhythm: Normal rate.  Pulmonary:     Effort: Pulmonary effort is normal.  Musculoskeletal:     Cervical back: Normal range of motion and neck supple. No rigidity.  Lymphadenopathy:     Cervical: No cervical adenopathy or masses.salivary glands without lesions. .  Neurological:     Mental Status: He is alert. CN 2-12 intact. No nystagmus      Assessment/Plan: Folliculitis/rhinitis-she has a irritated area on the left upper vestibule of the nose which does have a little bit of crusted area.  For now I will start Bactroban  and Keflex .  If this does not clear it up then we will try another antibiotic probably.  She may ultimately need a excision of this area if it persists.  She will follow-up in 3 weeks.  Throat mucus-this could be the same rhinitis issue that is also given her some eustachian tube like symptoms.  I will treat the above first and then if she continues with the symptoms we may switch to reflux therapy but not at the  same time as the above therapy.  Meghan Wilson 07/27/2024, 8:44 AM

## 2024-07-28 NOTE — Progress Notes (Signed)
 Subjective:   Patient ID: Meghan Wilson, female   DOB: 63 y.o.   MRN: [id number]   HPI Patient presents with 2 different problems with 1 being nail disease with pain and also has inflammation pain of the plantar heel that is hard to walk on comfortably.  Patient states this has been more recent and patient states also a corn callus that gets painful   Review of Systems  All other systems reviewed and are negative.       Objective:  Physical Exam Vitals and nursing note reviewed.  Constitutional:      Appearance: She is well-developed.  Pulmonary:     Effort: Pulmonary effort is normal.  Musculoskeletal:        General: Normal range of motion.  Skin:    General: Skin is warm.  Neurological:     Mental Status: She is alert.     Neurovascular status intact muscle strength found to be adequate range of motion within normal limits.  Patient is noted to have inflammation and discomfort of the plantar heel right with lesion formation that is sore and makes shoe gear difficult.  Patient has fluid buildup around the area also noted to have incurvated nailbed which can be tender at different times.  Good digital perfusion well-oriented     Assessment:  Inflammatory fasciitis right inflammation fluid medial and central band lesion formation and incurvated nailbed right hallux     Plan:  H&P reviewed conditions explained condition to patient.  At this point I went ahead and I did sterile prep injected the fascia 3 mg Kenalog  5 mg Xylocaine  and I did courtesy of the lesion no iatrogenic bleeding and will be seen back to recheck

## 2024-08-05 ENCOUNTER — Other Ambulatory Visit: Payer: Self-pay | Admitting: Physician Assistant

## 2024-08-05 DIAGNOSIS — Z1231 Encounter for screening mammogram for malignant neoplasm of breast: Secondary | ICD-10-CM

## 2024-08-07 ENCOUNTER — Other Ambulatory Visit: Payer: Self-pay | Admitting: Nurse Practitioner

## 2024-08-07 DIAGNOSIS — D509 Iron deficiency anemia, unspecified: Secondary | ICD-10-CM

## 2024-08-07 NOTE — Assessment & Plan Note (Signed)
 She has no prior history of anemia until Ortho surgery in 09/2021, anemia resolved after surgery, then recurrent more severe anemia with second surgery in 10/2021 Hgb 7.9 -She started oral iron p.o. once daily 10/21/2021, tolerating well with mild constipation managed with OTC meds -Past GI work-up 12/2020 showed duodenitis and H. Pylori+ which was treated.  No obvious bleeding source on upper endoscopy or colonoscopy (Dr. Leigh) -She does not currently have menstrual periods -Recent folic acid, B12, thyroid panel, and iron panel were normal -Her mild anemia is likely related to orthopedic operations and is recovering.   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   -She received 1 dose IV Feraheme in 07/2022 and continues oral iron, tolerated well. IDA resolved with IV and oral iron -Stopped oral iron in 10/2022 -12/09/2023 - CBC and CMP within normal limits. Ferritin and iron panels were also normal.  -04/20/2024 - CBC and iron panels are normal. Consider adding oral iron for ferritin between 100 and 200. Will treat with IV iron if ferritin level is < 100. Will be scheduled at [facility]. Patient is aware and agreeable to the plan.

## 2024-08-07 NOTE — Progress Notes (Unsigned)
 Patient Care Team: Stacia Millman, [place] as PCP - General (Physician Assistant) Lanny Callander, MD as Attending Physician (Hematology and Oncology) Burton, Lacie K, NP as Nurse Practitioner (Hematology and Oncology)  Clinic Day:  08/08/2024  Referring physician: Marylu Gee, DO  ASSESSMENT & PLAN:   Assessment & Plan: Anemia She has no prior history of anemia until Ortho surgery in 09/2021, anemia resolved after surgery, then recurrent more severe anemia with second surgery in 10/2021 Hgb 7.9 -She started oral iron p.o. once daily 10/21/2021, tolerating well with mild constipation managed with OTC meds -Past GI work-up 12/2020 showed duodenitis and H. Pylori+ which was treated.  No obvious bleeding source on upper endoscopy or colonoscopy (Dr. Leigh) -She does not currently have menstrual periods -Recent folic acid, B12, thyroid panel, and iron panel were normal -Her mild anemia is likely related to orthopedic operations and is recovering.   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   -She received 1 dose IV Feraheme in 07/2022 and continues oral iron, tolerated well. IDA resolved with IV and oral iron -Stopped oral iron in 10/2022 -12/09/2023 - CBC and CMP within normal limits. Ferritin and iron panels were also normal.  -04/20/2024 - CBC and iron panels are normal. Consider adding oral iron for ferritin between 100 and 200. Will treat with IV iron if ferritin level is < 100. Will be scheduled at [facility]. Patient is aware and agreeable to the plan.  - 08/08/2024 - CBC, iron panel, and ferritin, and ferritin levels continue to be unremarkable.  She remains off of oral iron. Recheck labs in 6 months.  Follow-up with labs in 1 year, sooner if needed.   Iron deficiency anemia This was due to occult GI bleed.  She has been off oral iron.  She denies unusual bleeding.  This includes platelets blood in stool, hematemesis, or  hemoptysis.  Continues to see GI on yearly basis.  Plan Labs reviewed. - CBC unremarkable with Hgb 13.5 and HCT 41.5. - Iron panel unremarkable - Ferritin above normal at 350. Recommend she stay off of oral iron. Continue regular visits with GI. Follow-up with primary care as scheduled. Recheck labs in 6 months. Labs and follow-up in 1 year, sooner if needed.  The patient understands the plans discussed today and is in agreement with them.  She knows to contact our office if she develops concerns prior to her next appointment.  I provided 20 minutes of face-to-face time during this encounter and > 50% was spent counseling as documented under my assessment and plan.    Powell FORBES Lessen, NP  [facility] - A DEPT OF [facility] [address] [place] [address] Dept: [phone] Dept Fax: [phone]   No orders of the defined types were placed in this encounter.     CHIEF COMPLAINT:  CC: Iron deficiency anemia  Current Treatment: IV Feraheme as needed  INTERVAL HISTORY:  Meghan Wilson is here today for repeat clinical assessment.  She last saw me on 04/20/2024.  She is no longer taking oral iron.  She denies excessive fatigue.  She denies headaches.  She does have shoulder pain, especially on the right.  She does see orthopedics for this.  She denies nausea, vomiting, diarrhea, or constipation.  She denies unusual bleeding such as blood in her stool, hemoptysis, or hematemesis.  She denies chest pain, chest pressure, shortness of  breath. Denies fevers or chills.  She denies unintentional weight loss.  She denies pain. Her appetite is good. Her weight has increased 5 pounds over last month.  I have reviewed the past medical history, past surgical history, social history and family history with the patient and they are unchanged from previous note.  ALLERGIES:  is allergic to aspirin , depakote [divalproex sodium],  ibuprofen, prednisone , and latex.  MEDICATIONS:  Current Outpatient Medications  Medication Sig Dispense Refill   Accu-Chek FastClix Lancets MISC Use to check blood sugar up to 7 times a week 102 each 3   acetaminophen -codeine  (TYLENOL  #3) 300-30 MG tablet Take 1 tablet by mouth 2 (two) times daily as needed for pain. 30 tablet 0   atorvastatin  (LIPITOR) 80 MG tablet Take 1 tablet (80 mg total) by mouth daily. 30 tablet 11   Capsaicin  0.035 % CREA Apply 1 application  topically 2 (two) times daily as needed. 42.5 g 0   cycloSPORINE  (RESTASIS ) 0.05 % ophthalmic emulsion Place 1 drop into both eyes 2 (two) times daily. 180 each 3   doxepin  (SINEQUAN ) 10 MG capsule Take 1 capsule (10 mg total) by mouth at bedtime as needed for sleep 30 capsule 2   fexofenadine  (ALLEGRA ) 180 MG tablet Take 1 tablet (180 mg total) by mouth daily. Swallow whole with water . Do not take with fruit juices 30 tablet 6   fluorometholone  (FML) 0.1 % ophthalmic suspension Place 1 drop into the right eye 4 (four) times daily. 5 mL 0   fluticasone  (FLONASE ) 50 MCG/ACT nasal spray Place 2 sprays into both nostrils daily. 16 g 6   glucose blood test strip USE TO CHECK BLOOD GLUCOSE UP TO 7 TIMES A WEEK. 100 strip 3   Glycerin -Hypromellose-PEG 400 (DRY EYE RELIEF DROPS) 0.2-0.2-1 % SOLN Place 1 drop into both eyes in the morning and at bedtime.     lamoTRIgine  (LAMICTAL ) 100 MG tablet Take 1 tablet by mouth once a day 30 tablet 2   linaclotide  (LINZESS ) 145 MCG CAPS capsule Take 1 capsule (145 mcg total) by mouth daily before breakfast. 90 capsule 3   Multiple Vitamins-Minerals (MULTIVITAMIN WITH MINERALS) tablet Take 1 tablet by mouth daily.     mupirocin  cream (BACTROBAN ) 2 % Apply 1 Application topically 2 (two) times daily for 14 days. 30 g 0   ondansetron  (ZOFRAN ) 4 MG tablet Take 1 tablet (4 mg total) by mouth every 6 (six) hours as needed for nausea. 20 tablet 0   polyethylene glycol (MIRALAX  / GLYCOLAX ) 17 g packet Mix 1  packet (17 g total) in 8 oz clear liquid and drink by mouth daily as needed for mild constipation. 14 each 0   prazosin  (MINIPRESS ) 1 MG capsule Take 1 capsule by mouth at bedtime 30 capsule 2   RESTASIS  0.05 % ophthalmic emulsion Instill 1 drop into both eyes twice a day 180 each 3   tirzepatide  (MOUNJARO ) 12.5 MG/0.5ML Pen Inject 12.5 mg into the skin once a week. 2 mL 2   traMADol  (ULTRAM ) 50 MG tablet Take 1 tablet (50 mg total) by mouth every 6 (six) hours as needed. 40 tablet 0   triamcinolone  cream (KENALOG ) 0.1 % Apply 1 Application topically underneath breast daily as needed. 45 g 2   triamcinolone  ointment (KENALOG ) 0.5 % Apply 1 Application topically to hands daily as needed. 30 g 2   No current facility-administered medications for this visit.      REVIEW OF SYSTEMS:   Constitutional: Denies fevers, chills  or abnormal weight loss Eyes: Denies blurriness of vision Ears, nose, mouth, throat, and face: Denies mucositis or sore throat Respiratory: Denies cough, dyspnea or wheezes Cardiovascular: Denies palpitation, chest discomfort or lower extremity swelling Gastrointestinal:  Denies nausea, heartburn or change in bowel habits Skin: Denies abnormal skin rashes Lymphatics: Denies new lymphadenopathy or easy bruising Neurological:Denies numbness, tingling or new weaknesses Behavioral/Psych: Mood is stable, no new changes  All other systems were reviewed with the patient and are negative.   VITALS:   Today's Vitals   08/08/24 1041 08/08/24 1054  BP: 135/80   Pulse: 96   Temp: (!) 97.3 F (36.3 C)   TempSrc: Temporal   SpO2: 99%   Weight: 177 lb 11.2 oz (80.6 kg)   PainSc:  0-No pain   Body mass index is 32.5 kg/m.   Wt Readings from Last 3 Encounters:  08/08/24 177 lb 11.2 oz (80.6 kg)  07/27/24 171 lb (77.6 kg)  07/19/24 172 lb (78 kg)    Body mass index is 32.5 kg/m.  Performance status (ECOG): 0 - Asymptomatic  PHYSICAL EXAM:   GENERAL:alert, no  distress and comfortable SKIN: skin color, texture, turgor are normal, no rashes or significant lesions EYES: normal, Conjunctiva are pink and non-injected, sclera clear OROPHARYNX:no exudate, no erythema and lips, buccal mucosa, and tongue normal  NECK: supple, thyroid normal size, non-tender, without nodularity LYMPH:  no palpable lymphadenopathy in the cervical, axillary or inguinal LUNGS: clear to auscultation and percussion with normal breathing effort HEART: regular rate & rhythm and no murmurs and no lower extremity edema ABDOMEN:abdomen soft, non-tender and normal bowel sounds Musculoskeletal:no cyanosis of digits and no clubbing  NEURO: alert & oriented x 3 with fluent speech, no focal motor/sensory deficits  LABORATORY DATA:  I have reviewed the data as listed    Component Value Date/Time   NA 141 12/09/2023 0842   NA 138 03/11/2023 0845   K 4.2 12/09/2023 0842   CL 104 12/09/2023 0842   CO2 30 12/09/2023 0842   GLUCOSE 93 12/09/2023 0842   BUN 9 12/09/2023 0842   BUN 9 03/11/2023 0845   CREATININE 0.76 12/09/2023 0842   CALCIUM  9.6 12/09/2023 0842   PROT 7.3 12/09/2023 0842   PROT 6.5 03/11/2023 0845   ALBUMIN  4.4 12/09/2023 0842   ALBUMIN  4.4 03/11/2023 0845   AST 15 12/09/2023 0842   ALT 17 12/09/2023 0842   ALKPHOS 73 12/09/2023 0842   BILITOT 0.4 12/09/2023 0842   GFRNONAA >60 12/09/2023 0842   GFRAA 119 05/11/2020 0935    Lab Results  Component Value Date   WBC 7.5 08/08/2024   NEUTROABS 4.2 08/08/2024   HGB 13.5 08/08/2024   HCT 41.5 08/08/2024   MCV 84.2 08/08/2024   PLT 309 08/08/2024    Iron/TIBC/Ferritin/ %Sat    Component Value Date/Time   IRON 129 08/08/2024 1020   IRON 24 (L) 06/25/2022 1420   TIBC 398 08/08/2024 1020   TIBC 351 06/25/2022 1420   FERRITIN 350 (H) 08/08/2024 1022   FERRITIN 81 06/25/2022 1420   IRONPCTSAT 32 (H) 08/08/2024 1020   IRONPCTSAT 7 (LL) 06/25/2022 1420

## 2024-08-08 ENCOUNTER — Other Ambulatory Visit: Attending: Nurse Practitioner

## 2024-08-08 ENCOUNTER — Encounter: Payer: Self-pay | Admitting: Nurse Practitioner

## 2024-08-08 ENCOUNTER — Ambulatory Visit: Admitting: Nurse Practitioner

## 2024-08-08 VITALS — BP 135/80 | HR 96 | Temp 97.3°F | Wt 177.7 lb

## 2024-08-08 DIAGNOSIS — D509 Iron deficiency anemia, unspecified: Secondary | ICD-10-CM | POA: Diagnosis not present

## 2024-08-08 LAB — CBC WITH DIFFERENTIAL (CANCER CENTER ONLY)
Abs Immature Granulocytes: 0.01 K/uL (ref 0.00–0.07)
Basophils Absolute: 0 K/uL (ref 0.0–0.1)
Basophils Relative: 1 %
Eosinophils Absolute: 0.1 K/uL (ref 0.0–0.5)
Eosinophils Relative: 1 %
HCT: 41.5 % (ref 36.0–46.0)
Hemoglobin: 13.5 g/dL (ref 12.0–15.0)
Immature Granulocytes: 0 %
Lymphocytes Relative: 34 %
Lymphs Abs: 2.6 K/uL (ref 0.7–4.0)
MCH: 27.4 pg (ref 26.0–34.0)
MCHC: 32.5 g/dL (ref 30.0–36.0)
MCV: 84.2 fL (ref 80.0–100.0)
Monocytes Absolute: 0.7 K/uL (ref 0.1–1.0)
Monocytes Relative: 9 %
Neutro Abs: 4.2 K/uL (ref 1.7–7.7)
Neutrophils Relative %: 55 %
Platelet Count: 309 K/uL (ref 150–400)
RBC: 4.93 MIL/uL (ref 3.87–5.11)
RDW: 14.9 % (ref 11.5–15.5)
WBC Count: 7.5 K/uL (ref 4.0–10.5)
nRBC: 0 % (ref 0.0–0.2)

## 2024-08-08 LAB — IRON AND IRON BINDING CAPACITY (CC-WL,HP ONLY)
Iron: 129 ug/dL (ref 28–170)
Saturation Ratios: 32 % — ABNORMAL HIGH (ref 10.4–31.8)
TIBC: 398 ug/dL (ref 250–450)
UIBC: 269 ug/dL

## 2024-08-08 LAB — FERRITIN: Ferritin: 350 ng/mL — ABNORMAL HIGH (ref 11–307)

## 2024-08-11 ENCOUNTER — Other Ambulatory Visit (HOSPITAL_COMMUNITY): Payer: Self-pay

## 2024-08-11 ENCOUNTER — Other Ambulatory Visit: Payer: Self-pay

## 2024-08-12 ENCOUNTER — Other Ambulatory Visit: Payer: Self-pay

## 2024-08-12 ENCOUNTER — Other Ambulatory Visit (HOSPITAL_COMMUNITY): Payer: Self-pay

## 2024-08-15 ENCOUNTER — Other Ambulatory Visit (HOSPITAL_COMMUNITY): Payer: Self-pay

## 2024-08-15 MED ORDER — GABAPENTIN 300 MG PO CAPS
ORAL_CAPSULE | ORAL | 0 refills | Status: DC
  Filled 2024-08-15: qty 90, 33d supply, fill #0

## 2024-08-16 ENCOUNTER — Other Ambulatory Visit (HOSPITAL_COMMUNITY): Payer: Self-pay

## 2024-08-16 ENCOUNTER — Encounter (INDEPENDENT_AMBULATORY_CARE_PROVIDER_SITE_OTHER): Payer: Self-pay | Admitting: Internal Medicine

## 2024-08-16 ENCOUNTER — Ambulatory Visit (INDEPENDENT_AMBULATORY_CARE_PROVIDER_SITE_OTHER): Payer: Self-pay | Admitting: Internal Medicine

## 2024-08-16 VITALS — BP 138/83 | HR 97 | Temp 97.3°F | Ht 62.0 in | Wt 170.0 lb

## 2024-08-16 DIAGNOSIS — E669 Obesity, unspecified: Secondary | ICD-10-CM

## 2024-08-16 DIAGNOSIS — E66811 Obesity, class 1: Secondary | ICD-10-CM

## 2024-08-16 DIAGNOSIS — G4733 Obstructive sleep apnea (adult) (pediatric): Secondary | ICD-10-CM

## 2024-08-16 MED ORDER — TIRZEPATIDE 12.5 MG/0.5ML ~~LOC~~ SOAJ
12.5000 mg | SUBCUTANEOUS | 0 refills | Status: DC
  Filled 2024-09-08: qty 6, 84d supply, fill #0

## 2024-08-16 NOTE — Progress Notes (Deleted)
 Meghan Parker, MD ABIM, ABOM  [address] [place], [place], [place] [address] Office: [phone]  /  Fax: [phone]    Weight Summary and Body Composition Analysis (BIA)  Vitals Temp: (!) 97.3 F (36.3 C) BP: 138/83 Pulse Rate: 97 SpO2: 99 %   Anthropometric Measurements Height: 5' 2 (1.575 m) Weight: 170 lb (77.1 kg) BMI (Calculated): 31.09 Weight at Last Visit: 172 lb Weight Lost Since Last Visit: 2 lb Weight Gained Since Last Visit: 0 lb Starting Weight: 195 lb Total Weight Loss (lbs): 25 lb (11.3 kg) Peak Weight: 207 lb   Body Composition  Body Fat %: 44 % Fat Mass (lbs): 74.8 lbs Muscle Mass (lbs): 90.6 lbs Total Body Water  (lbs): 63.6 lbs Visceral Fat Rating : 12    RMR: 1627  Today's Visit #: 39  Starting Date: 11/01/21   Subjective   Chief Complaint: Obesity  Interval History  Meghan Wilson is here to discuss her progress with her obesity treatment plan. She is following {emwtlossplannewint:31639} and states she is following her eating plan approximately {empercentages:32441}% of the time. She states she is {emyesnoexercise:32811}.  [] Denies [] Reports []  Improved problems with appetite and hunger signals.  [] Denies [] Reports []  Improved problems with satiety and satiation.  [] Denies [] Reports []  Improved problems with eating patterns and portion control.  [] Denies [] Reports []  Improved abnormal cravings  Discussed the use of AI scribe software for clinical note transcription with the patient, who gave verbal consent to proceed.  History of Present Illness        Challenges affecting patient progress: {EMOBESITYBARRIERS:28841::none}.    Pharmacotherapy for weight management: She is currently taking {EMPharmaco:28845}.   Assessment and Plan   Treatment Plan For Obesity:  Recommended Dietary Goals  Glendia is currently in the action stage of change. As such, her goal is to continue weight management plan. She has agreed to:  {EMWTLOSSPLAN:29297::continue current plan}  Behavioral Health and Counseling  We discussed the following behavioral modification strategies today: {EMWMwtlossstrategies:28914::continue to work on maintaining a reduced calorie state, getting the recommended amount of protein, incorporating whole foods, making healthy choices, staying well hydrated and practicing mindfulness when eating.,increase protein intake, fibrous foods (25 grams per day for women, 30 grams for men) and water  to improve satiety and decrease hunger signals. }.  Additional education and resources provided today: {EMadditionalresources:29169::None}  Recommended Physical Activity Goals  Meghan Wilson has been advised to work up to 150 minutes of moderate intensity aerobic activity a week and strengthening exercises 2-3 times per week for cardiovascular health, weight loss maintenance and preservation of muscle mass.  She has agreed to :  {EMEXERCISE:28847::Think about enjoyable ways to increase daily physical activity and overcoming barriers to exercise,Increase physical activity in their day and reduce sedentary time (increase NEAT).,Increase volume of physical activity to a goal of 240 minutes a week,Combine aerobic and strengthening exercises for efficiency and improved cardiometabolic health.}  Medical Interventions and Pharmacotherapy  We have reviewed current or available treatment options to assist her with their weight loss efforts and we agreed to : {EMagreedrx:29170}  Associated Conditions Impacted by Obesity Treatment  Assessment & Plan OSA on CPAP  Type 2 diabetes mellitus in patient with obesity (HCC)  Class 1 obesity with serious comorbidity and body mass index (BMI) of 31.0 to 31.9 in adult, unspecified obesity type     Assessment and Plan Assessment & Plan       No orders of the defined types were placed in this encounter.    Objective  Physical Exam:  Blood pressure 138/83,  pulse 97, temperature (!) 97.3 F (36.3 C), height 5' 2 (1.575 m), weight 170 lb (77.1 kg), SpO2 99%. Body mass index is 31.09 kg/m.  General: She is overweight, cooperative, alert, well developed, and in no acute distress. PSYCH: Has normal mood, affect and thought process.   HEENT: EOMI, sclerae are anicteric. Lungs: Normal breathing effort, no conversational dyspnea. Extremities: No edema.  Neurologic: No gross sensory or motor deficits. No tremors or fasciculations noted.    Diagnostic Data Reviewed:  BMET    Component Value Date/Time   NA 141 12/09/2023 0842   NA 138 03/11/2023 0845   K 4.2 12/09/2023 0842   CL 104 12/09/2023 0842   CO2 30 12/09/2023 0842   GLUCOSE 93 12/09/2023 0842   BUN 9 12/09/2023 0842   BUN 9 03/11/2023 0845   CREATININE 0.76 12/09/2023 0842   CALCIUM  9.6 12/09/2023 0842   GFRNONAA >60 12/09/2023 0842   GFRAA 119 05/11/2020 0935   Lab Results  Component Value Date   HGBA1C 5.7 (A) 01/11/2024   HGBA1C 6.5 (H) 04/02/2018   Lab Results  Component Value Date   INSULIN  6.1 03/11/2023   INSULIN  5.8 11/21/2021   Lab Results  Component Value Date   TSH 0.650 01/11/2024   CBC    Component Value Date/Time   WBC 7.5 08/08/2024 1020   WBC 5.8 10/09/2023 0443   RBC 4.93 08/08/2024 1020   HGB 13.5 08/08/2024 1020   HGB 11.9 06/25/2022 1420   HCT 41.5 08/08/2024 1020   HCT 38.5 06/25/2022 1420   PLT 309 08/08/2024 1020   PLT 398 06/25/2022 1420   MCV 84.2 08/08/2024 1020   MCV 81 06/25/2022 1420   MCH 27.4 08/08/2024 1020   MCHC 32.5 08/08/2024 1020   RDW 14.9 08/08/2024 1020   RDW 13.5 06/25/2022 1420   Iron Studies    Component Value Date/Time   IRON 129 08/08/2024 1020   IRON 24 (L) 06/25/2022 1420   TIBC 398 08/08/2024 1020   TIBC 351 06/25/2022 1420   FERRITIN 350 (H) 08/08/2024 1022   FERRITIN 81 06/25/2022 1420   IRONPCTSAT 32 (H) 08/08/2024 1020   IRONPCTSAT 7 (LL) 06/25/2022 1420   Lipid Panel     Component Value  Date/Time   CHOL 144 09/04/2022 1027   TRIG 52 09/04/2022 1027   HDL 70 09/04/2022 1027   CHOLHDL 2.7 05/02/2021 1158   CHOLHDL 2.9 04/02/2018 1115   VLDL 51 (H) 04/02/2018 1115   LDLCALC 63 09/04/2022 1027   Hepatic Function Panel     Component Value Date/Time   PROT 7.3 12/09/2023 0842   PROT 6.5 03/11/2023 0845   ALBUMIN  4.4 12/09/2023 0842   ALBUMIN  4.4 03/11/2023 0845   AST 15 12/09/2023 0842   ALT 17 12/09/2023 0842   ALKPHOS 73 12/09/2023 0842   BILITOT 0.4 12/09/2023 0842      Component Value Date/Time   TSH 0.650 01/11/2024 1100   Nutritional Lab Results  Component Value Date   VD25OH 78.3 03/11/2023   VD25OH 54.3 09/04/2022   VD25OH 34.2 11/21/2021    Medications: Outpatient Encounter Medications as of 08/16/2024  Medication Sig   Accu-Chek FastClix Lancets MISC Use to check blood sugar up to 7 times a week   acetaminophen -codeine  (TYLENOL  #3) 300-30 MG tablet Take 1 tablet by mouth 2 (two) times daily as needed for pain.   atorvastatin  (LIPITOR) 80 MG tablet Take 1 tablet (80 mg total)  by mouth daily.   Capsaicin  0.035 % CREA Apply 1 application  topically 2 (two) times daily as needed.   cycloSPORINE  (RESTASIS ) 0.05 % ophthalmic emulsion Place 1 drop into both eyes 2 (two) times daily.   doxepin  (SINEQUAN ) 10 MG capsule Take 1 capsule (10 mg total) by mouth at bedtime as needed for sleep   fexofenadine  (ALLEGRA ) 180 MG tablet Take 1 tablet (180 mg total) by mouth daily. Swallow whole with water . Do not take with fruit juices   fluorometholone  (FML) 0.1 % ophthalmic suspension Place 1 drop into the right eye 4 (four) times daily.   fluticasone  (FLONASE ) 50 MCG/ACT nasal spray Place 2 sprays into both nostrils daily.   gabapentin  (NEURONTIN ) 300 MG capsule Take 1 capsule (300 mg total) by mouth at bedtime for 3 days, THEN 1 capsule (300 mg total) 2 (two) times daily for 3 days, THEN 1 capsule (300 mg total) 3 (three) times daily. *If this med makes you drowsy,  only take at bedtime*.   glucose blood test strip USE TO CHECK BLOOD GLUCOSE UP TO 7 TIMES A WEEK.   Glycerin -Hypromellose-PEG 400 (DRY EYE RELIEF DROPS) 0.2-0.2-1 % SOLN Place 1 drop into both eyes in the morning and at bedtime.   lamoTRIgine  (LAMICTAL ) 100 MG tablet Take 1 tablet by mouth once a day   linaclotide  (LINZESS ) 145 MCG CAPS capsule Take 1 capsule (145 mcg total) by mouth daily before breakfast.   Multiple Vitamins-Minerals (MULTIVITAMIN WITH MINERALS) tablet Take 1 tablet by mouth daily.   ondansetron  (ZOFRAN ) 4 MG tablet Take 1 tablet (4 mg total) by mouth every 6 (six) hours as needed for nausea.   polyethylene glycol (MIRALAX  / GLYCOLAX ) 17 g packet Mix 1 packet (17 g total) in 8 oz clear liquid and drink by mouth daily as needed for mild constipation.   prazosin  (MINIPRESS ) 1 MG capsule Take 1 capsule by mouth at bedtime   RESTASIS  0.05 % ophthalmic emulsion Instill 1 drop into both eyes twice a day   tirzepatide  (MOUNJARO ) 12.5 MG/0.5ML Pen Inject 12.5 mg into the skin once a week.   traMADol  (ULTRAM ) 50 MG tablet Take 1 tablet (50 mg total) by mouth every 6 (six) hours as needed.   triamcinolone  cream (KENALOG ) 0.1 % Apply 1 Application topically underneath breast daily as needed.   triamcinolone  ointment (KENALOG ) 0.5 % Apply 1 Application topically to hands daily as needed.   No facility-administered encounter medications on file as of 08/16/2024.     Follow-Up   No follow-ups on file.Meghan Wilson She was informed of the importance of frequent follow up visits to maximize her success with intensive lifestyle modifications for her multiple health conditions.  Attestation Statement   Reviewed by clinician on day of visit: allergies, medications, problem list, medical history, surgical history, family history, social history, and previous encounter notes.     Meghan Parker, MD ABIM, [place]

## 2024-08-16 NOTE — Progress Notes (Signed)
**Note Meghan-Identified via Obfuscation**  Office: [phone]  /  Fax: [phone]  Weight Summary and Body Composition Analysis (BIA)  Vitals Temp: (!) 97.3 F (36.3 C) BP: 138/83 Pulse Rate: 97 SpO2: 99 %   Anthropometric Measurements Height: 5' 2 (1.575 m) Weight: 170 lb (77.1 kg) BMI (Calculated): 31.09 Weight at Last Visit: 172 lb Weight Lost Since Last Visit: 2 lb Weight Gained Since Last Visit: 0 lb Starting Weight: 195 lb Total Weight Loss (lbs): 25 lb (11.3 kg) Peak Weight: 207 lb   Body Composition  Body Fat %: 44 % Fat Mass (lbs): 74.8 lbs Muscle Mass (lbs): 90.6 lbs Total Body Water  (lbs): 63.6 lbs Visceral Fat Rating : 12    RMR: [id number]  Today's Visit #: 39  Starting Date: 11/01/21   Subjective   Chief Complaint: Obesity  Interval History  Discussed the use of AI scribe software for clinical note transcription with the patient, who gave verbal consent to proceed.  History of Present Illness Meghan Wilson is a 63 year old female with obesity, sleep apnea, type 2 diabetes, and hyperlipidemia who presents for medical weight management.  She has been following a 1200 calorie, low carb, moderate protein nutrition plan with good adherence and has lost two pounds since her last office visit. Since starting the program, she has lost approximately 33 pounds over two years and eleven months, which is about 16.5% of her body weight. She is currently on Mounjaro  12.5 mg once a week for diabetes and weight management, which she has been taking since August 2025.  She experiences right shoulder pain due to built-up calcium , affecting her sleep as she wakes up between 1 and 2 AM due to the pain. The pain is severe, particularly at night, and she feels 'worn out really bad' due to the lack of sleep. She was prescribed gabapentin  yesterday to manage the pain.  Her recent blood work showed an LDL cholesterol of 55, and she is taking atorvastatin  80 mg daily. Her recent blood work showed a GFR of 100 and  normal liver enzymes. Her hemoglobin is 12.8. Her A1c is 5.9, which is slightly higher than her previous reading.  She discusses her carbohydrate intake, mentioning eating spaghetti and rice recently, and is learning to adjust her diet accordingly. She uses a scale to measure her portions.  She has been focusing on surrounding herself with supportive people and has distanced herself from those who do not have her best interests at heart. Her family, including her children, grandchildren, and great-grandchildren, are very important to her.     Challenges affecting patient progress: orthopedic problems, medical conditions or chronic pain affecting mobility and inadequate sleep .    Pharmacotherapy for weight management: She is currently taking Monjauro with diabetes as the primary indication and obesity secondary with adequate clinical response  and without side effects..   Assessment and Plan   Treatment Plan For Obesity:  Recommended Dietary Goals  Meghan Wilson is currently in the action stage of change. As such, her goal is to continue weight management plan. She has agreed to: continue current plan  Behavioral Health and Counseling  We discussed the following behavioral modification strategies today: continue to work on maintaining a reduced calorie state, getting the recommended amount of protein, incorporating whole foods, making healthy choices, staying well hydrated and practicing mindfulness when eating. and increase protein intake, fibrous foods (25 grams per day for women, 30 grams for men) and water  to improve satiety and decrease hunger signals. .  Additional education  and resources provided today: Handout on traveling and holiday eating strategies  Recommended Physical Activity Goals  Meghan Wilson has been advised to work up to 150 minutes of moderate intensity aerobic activity a week and strengthening exercises 2-3 times per week for cardiovascular health, weight loss maintenance and  preservation of muscle mass.  She has agreed to :  Think about enjoyable ways to increase daily physical activity and overcoming barriers to exercise and Increase physical activity in their day and reduce sedentary time (increase NEAT).  Medical Interventions and Pharmacotherapy  We discussed various medication options to help Meghan Wilson with her weight loss efforts and we both agreed to : Adequate clinical response to anti-obesity medication, continue current regimen  Associated Conditions Impacted by Obesity Treatment  Assessment & Plan OSA on CPAP Class 1 obesity with serious comorbidity and body mass index (BMI) of 31.0 to 31.9 in adult, unspecified obesity type Type 2 diabetes mellitus in patient with obesity (HCC)     Assessment and Plan Assessment & Plan Class 1 obesity Significant weight loss of approximately 33 pounds (16.5%) over two years and eleven months. Current weight management includes a 1200 calorie, low carb, moderate protein nutrition plan with good adherence. Recent weight loss of 2 pounds since last visit. Current weight is lower than in 2021, with a waist circumference reduction from 45 inches to 40 inches. No weight regain observed over the past three years, which is a significant achievement. Discussed the importance of maintaining weight loss and the challenges of further weight reduction as fat mass decreases. - Continue 1200 calorie, low carb, moderate protein nutrition plan. - Encouraged use of a scale to measure carbohydrate intake, aiming for 120 grams per day. - Advised on choosing whole grains over white grains and avoiding artificial sweeteners. - Scheduled follow-up appointment in mid-January.  Type 2 diabetes mellitus Type 2 diabetes is well-controlled with an A1c of 5.9, below the target of 6.4. Previously in remission with an A1c of 5.5. Discussed the importance of maintaining lifestyle changes to prevent diabetes from returning. Emphasized the role of  diet and physical activity in managing diabetes. Explained that diabetes is highly dependent on lifestyle and that changes in diet or physical activity could lead to weight regain and diabetes recurrence. - Continue Mounjaro  12.5 mg once a week. - Monitor carbohydrate intake, aiming for 120 grams per day. - Avoid artificial sweeteners and choose natural alternatives like stevia. - Scheduled follow-up appointment in mid-January.  Obstructive sleep apnea Managed with CPAP therapy. - Continue CPAP therapy.  Recording duration: 28 minutes      Objective   Physical Exam:  Blood pressure 138/83, pulse 97, temperature (!) 97.3 F (36.3 C), height 5' 2 (1.575 m), weight 170 lb (77.1 kg), SpO2 99%. Body mass index is 31.09 kg/m.  General: She is overweight, cooperative, alert, well developed, and in no acute distress. PSYCH: Has normal mood, affect and thought process.   HEENT: EOMI, sclerae are anicteric. Lungs: Normal breathing effort, no conversational dyspnea. Extremities: No edema.  Neurologic: No gross sensory or motor deficits. No tremors or fasciculations noted.    Diagnostic Data Reviewed:  BMET    Component Value Date/Time   NA 141 12/09/2023 0842   NA 138 03/11/2023 0845   K 4.2 12/09/2023 0842   CL 104 12/09/2023 0842   CO2 30 12/09/2023 0842   GLUCOSE 93 12/09/2023 0842   BUN 9 12/09/2023 0842   BUN 9 03/11/2023 0845   CREATININE 0.76 12/09/2023 9157  CALCIUM  9.6 12/09/2023 0842   GFRNONAA >60 12/09/2023 0842   GFRAA 119 05/11/2020 0935   Lab Results  Component Value Date   HGBA1C 5.7 (A) 01/11/2024   HGBA1C 6.5 (H) 04/02/2018   Lab Results  Component Value Date   INSULIN  6.1 03/11/2023   INSULIN  5.8 11/21/2021   Lab Results  Component Value Date   TSH 0.650 01/11/2024   CBC    Component Value Date/Time   WBC 7.5 08/08/2024 1020   WBC 5.8 10/09/2023 0443   RBC 4.93 08/08/2024 1020   HGB 13.5 08/08/2024 1020   HGB 11.9 06/25/2022 1420   HCT  41.5 08/08/2024 1020   HCT 38.5 06/25/2022 1420   PLT 309 08/08/2024 1020   PLT 398 06/25/2022 1420   MCV 84.2 08/08/2024 1020   MCV 81 06/25/2022 1420   MCH 27.4 08/08/2024 1020   MCHC 32.5 08/08/2024 1020   RDW 14.9 08/08/2024 1020   RDW 13.5 06/25/2022 1420   Iron Studies    Component Value Date/Time   IRON 129 08/08/2024 1020   IRON 24 (L) 06/25/2022 1420   TIBC 398 08/08/2024 1020   TIBC 351 06/25/2022 1420   FERRITIN 350 (H) 08/08/2024 1022   FERRITIN 81 06/25/2022 1420   IRONPCTSAT 32 (H) 08/08/2024 1020   IRONPCTSAT 7 (LL) 06/25/2022 1420   Lipid Panel     Component Value Date/Time   CHOL 144 09/04/2022 1027   TRIG 52 09/04/2022 1027   HDL 70 09/04/2022 1027   CHOLHDL 2.7 05/02/2021 1158   CHOLHDL 2.9 04/02/2018 1115   VLDL 51 (H) 04/02/2018 1115   LDLCALC 63 09/04/2022 1027   Hepatic Function Panel     Component Value Date/Time   PROT 7.3 12/09/2023 0842   PROT 6.5 03/11/2023 0845   ALBUMIN  4.4 12/09/2023 0842   ALBUMIN  4.4 03/11/2023 0845   AST 15 12/09/2023 0842   ALT 17 12/09/2023 0842   ALKPHOS 73 12/09/2023 0842   BILITOT 0.4 12/09/2023 0842      Component Value Date/Time   TSH 0.650 01/11/2024 1100   Nutritional Lab Results  Component Value Date   VD25OH 78.3 03/11/2023   VD25OH 54.3 09/04/2022   VD25OH 34.2 11/21/2021    Medications: Outpatient Encounter Medications as of 08/16/2024  Medication Sig   Accu-Chek FastClix Lancets MISC Use to check blood sugar up to 7 times a week   acetaminophen -codeine  (TYLENOL  #3) 300-30 MG tablet Take 1 tablet by mouth 2 (two) times daily as needed for pain.   atorvastatin  (LIPITOR) 80 MG tablet Take 1 tablet (80 mg total) by mouth daily.   Capsaicin  0.035 % CREA Apply 1 application  topically 2 (two) times daily as needed.   cycloSPORINE  (RESTASIS ) 0.05 % ophthalmic emulsion Place 1 drop into both eyes 2 (two) times daily.   doxepin  (SINEQUAN ) 10 MG capsule Take 1 capsule (10 mg total) by mouth at  bedtime as needed for sleep   fexofenadine  (ALLEGRA ) 180 MG tablet Take 1 tablet (180 mg total) by mouth daily. Swallow whole with water . Do not take with fruit juices   fluorometholone  (FML) 0.1 % ophthalmic suspension Place 1 drop into the right eye 4 (four) times daily.   fluticasone  (FLONASE ) 50 MCG/ACT nasal spray Place 2 sprays into both nostrils daily.   gabapentin  (NEURONTIN ) 300 MG capsule Take 1 capsule (300 mg total) by mouth at bedtime for 3 days, THEN 1 capsule (300 mg total) 2 (two) times daily for 3 days, THEN 1  capsule (300 mg total) 3 (three) times daily. *If this med makes you drowsy, only take at bedtime*.   glucose blood test strip USE TO CHECK BLOOD GLUCOSE UP TO 7 TIMES A WEEK.   Glycerin -Hypromellose-PEG 400 (DRY EYE RELIEF DROPS) 0.2-0.2-1 % SOLN Place 1 drop into both eyes in the morning and at bedtime.   lamoTRIgine  (LAMICTAL ) 100 MG tablet Take 1 tablet by mouth once a day   linaclotide  (LINZESS ) 145 MCG CAPS capsule Take 1 capsule (145 mcg total) by mouth daily before breakfast.   Multiple Vitamins-Minerals (MULTIVITAMIN WITH MINERALS) tablet Take 1 tablet by mouth daily.   ondansetron  (ZOFRAN ) 4 MG tablet Take 1 tablet (4 mg total) by mouth every 6 (six) hours as needed for nausea.   polyethylene glycol (MIRALAX  / GLYCOLAX ) 17 g packet Mix 1 packet (17 g total) in 8 oz clear liquid and drink by mouth daily as needed for mild constipation.   prazosin  (MINIPRESS ) 1 MG capsule Take 1 capsule by mouth at bedtime   RESTASIS  0.05 % ophthalmic emulsion Instill 1 drop into both eyes twice a day   traMADol  (ULTRAM ) 50 MG tablet Take 1 tablet (50 mg total) by mouth every 6 (six) hours as needed.   triamcinolone  cream (KENALOG ) 0.1 % Apply 1 Application topically underneath breast daily as needed.   triamcinolone  ointment (KENALOG ) 0.5 % Apply 1 Application topically to hands daily as needed.   [DISCONTINUED] tirzepatide  (MOUNJARO ) 12.5 MG/0.5ML Pen Inject 12.5 mg into the skin  once a week.   tirzepatide  (MOUNJARO ) 12.5 MG/0.5ML Pen Inject 12.5 mg into the skin once a week.   No facility-administered encounter medications on file as of 08/16/2024.     Follow-Up   Return in about 4 weeks (around 09/13/2024) for For Weight Mangement with Dr. Francyne.SABRA She was informed of the importance of frequent follow up visits to maximize her success with intensive lifestyle modifications for her multiple health conditions.  Attestation Statement   Reviewed by clinician on day of visit: allergies, medications, problem list, medical history, surgical history, family history, social history, and previous encounter notes.     Lucas Francyne, MD

## 2024-08-17 ENCOUNTER — Encounter (INDEPENDENT_AMBULATORY_CARE_PROVIDER_SITE_OTHER): Payer: Self-pay | Admitting: Otolaryngology

## 2024-08-17 ENCOUNTER — Ambulatory Visit (INDEPENDENT_AMBULATORY_CARE_PROVIDER_SITE_OTHER): Admitting: Otolaryngology

## 2024-08-17 VITALS — BP 122/77 | HR 90 | Temp 97.7°F | Ht 62.0 in | Wt 170.0 lb

## 2024-08-17 DIAGNOSIS — H9319 Tinnitus, unspecified ear: Secondary | ICD-10-CM | POA: Diagnosis not present

## 2024-08-17 DIAGNOSIS — L739 Follicular disorder, unspecified: Secondary | ICD-10-CM

## 2024-08-17 DIAGNOSIS — H9313 Tinnitus, bilateral: Secondary | ICD-10-CM

## 2024-08-17 NOTE — Progress Notes (Signed)
 Reason for Consult: Tinnitus Referring Physician: Dr. [name] is an 63 y.o. female.  HPI: Here for follow-up for her mucus in the throat as well as the sore in her nose.  She was placed on Bactroban  and Keflex .  She essentially has resolved her throat and nasal symptoms.  She is very happy.  She still has the bilateral tinnitus.  Past Medical History:  Diagnosis Date   Anemia    Anxiety    Arthritis    Back pain    Bipolar depression (HCC) 09/2015   Chronic knee pain after total replacement of right knee joint 10/02/2021   Constipation    Depression 06/27/2021   Dizziness    Generalized abdominal discomfort 05/06/2021   History of colon polyps    History of kidney stones    History of patellar fracture 2016   History of tibial fracture 2016   Hyperlipidemia    IBS (irritable bowel syndrome)    Joint pain    Night terrors    Night terrors, adult 09/2015   OSA (obstructive sleep apnea) 04/07/2022   Osteoarthritis    Pain in joint of right shoulder 09/16/2021   Pain of right knee after injury 03/10/2018   Post concussion syndrome 12/20/2019   S/P hardware removal, right tibia 09/17/2021   S/P total knee arthroplasty, 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   Seasonal allergies 11/09/2020   Sleep apnea    uses cpap   Type 2 diabetes mellitus (HCC)    Vaginal atrophy 11/09/2020   Vitamin B 12 deficiency    Vitamin D  deficiency     Past Surgical History:  Procedure Laterality Date   ABDOMINAL HYSTERECTOMY     CHOLECYSTECTOMY     CLOSED REDUCTION TIBIAL FRACTURE Right 2003   COLONOSCOPY  2016   [facility]    HARDWARE REMOVAL Right 09/17/2021   Procedure: Removal of hardware from right tibia;  Surgeon: Meghan Cough, MD;  Location: [facility];  Service: Orthopedics;  Laterality: Right;   MANDIBLE SURGERY  2022   TOTAL HIP ARTHROPLASTY Left 05/13/2022   Procedure: TOTAL HIP ARTHROPLASTY ANTERIOR APPROACH;  Surgeon: Meghan Cough, MD;   Location: [facility];  Service: Orthopedics;  Laterality: Left;   TOTAL KNEE ARTHROPLASTY Right 10/17/2021   Procedure: TOTAL KNEE ARTHROPLASTY;  Surgeon: Meghan Cough, MD;  Location: [facility];  Service: Orthopedics;  Laterality: Right;   WISDOM TOOTH EXTRACTION      Family History  Problem Relation Age of Onset   Hyperlipidemia Mother    Hypertension Mother    Colon polyps Mother    Heart disease Mother    Diabetes Mother    Depression Mother    Anxiety disorder Mother    Bipolar disorder Mother    Eating disorder Mother    Obesity Mother    Arthritis/Rheumatoid Father    Hyperlipidemia Maternal Aunt    Hypertension Maternal Aunt    Cancer Maternal Aunt        blood cancer   Colon polyps Maternal Grandmother    Stroke Other    Colon cancer Neg Hx    Esophageal cancer Neg Hx    Stomach cancer Neg Hx    Rectal cancer Neg Hx    Sleep apnea Neg Hx    Breast cancer Neg Hx     Social History:  reports that she quit smoking about 6 years ago. Her smoking use included cigarettes. She started smoking about 13 years ago. She  has a 1.8 pack-year smoking history. She has never used smokeless tobacco. She reports current alcohol  use. She reports that she does not use drugs.  Allergies: Allergies[1]   No results found for this or any previous visit (from the past 48 hours).  No results found.  ROS Blood pressure 122/77, pulse 90, temperature 97.7 F (36.5 C), height 5' 2 (1.575 m), weight 170 lb (77.1 kg), SpO2 98%. Physical Exam Constitutional:      Appearance: Normal appearance.  HENT:     Head: Normocephalic and atraumatic.     Right Ear: Tympanic membrane is without lesions and middle ear aerated, ear canal and external ear normal.     Left Ear: Tympanic membrane is without lesions and middle ear aerated, ear canal and external ear normal.     Nose: Nose without deviation of septum.  Turbinates with mild hypertrophy, No significant swelling or masses.     Oral  cavity/oropharynx: Mucous membranes are moist. No lesions or masses    Larynx: normal voice. Mirror attempted without success    Eyes:     Extraocular Movements: Extraocular movements intact.     Conjunctiva/sclera: Conjunctivae normal.     Pupils: Pupils are equal, round, and reactive to light.  Cardiovascular:     Rate and Rhythm: Normal rate.  Pulmonary:     Effort: Pulmonary effort is normal.  Musculoskeletal:     Cervical back: Normal range of motion and neck supple. No rigidity.  Lymphadenopathy:     Cervical: No cervical adenopathy or masses.salivary glands without lesions. .     Salivary glands- no mass or swelling Neurological:     Mental Status: He is alert. CN 2-12 intact. No nystagmus      Assessment/Plan: Nasal lesion-she is completely better.  Has no further symptoms.  The mucus in her throat is even cleared up.  We talked about options at this point and she can apply the antibiotic cream periodically to the nose if it start to feel irritated.  She otherwise will follow-up as needed.  She does not need a treatment for the mucus as it has resolved  Tinnitus-she will need an audiogram to further evaluate this and this will be ordered.  Meghan Wilson 08/17/2024, 8:46 AM        [1]  Allergies Allergen Reactions   Aspirin      Upsets stomach in large doses   Depakote [Divalproex Sodium] Other (See Comments)    Hair fell out   Ibuprofen Nausea And Vomiting   Prednisone  Other (See Comments)    Constipation, nervousness All steroids   Latex Rash

## 2024-08-19 ENCOUNTER — Ambulatory Visit (INDEPENDENT_AMBULATORY_CARE_PROVIDER_SITE_OTHER): Admitting: Audiology

## 2024-08-19 DIAGNOSIS — H93293 Other abnormal auditory perceptions, bilateral: Secondary | ICD-10-CM

## 2024-08-19 DIAGNOSIS — H9042 Sensorineural hearing loss, unilateral, left ear, with unrestricted hearing on the contralateral side: Secondary | ICD-10-CM | POA: Diagnosis not present

## 2024-08-19 NOTE — Progress Notes (Signed)
" °  [address], [address] [place], [place] [address] [phone]  Audiological Evaluation    Name: Meghan Wilson     DOB:   1960-10-12      MRN:   [id number]                                                                                     Service Date: 08/19/2024     Accompanied by: self    Patient comes today after Dr. Roark, ENT sent a referral for a hearing evaluation due to concerns with tinnitus.   Symptoms Yes Details  Hearing loss  []    Tinnitus  [x]  Both ears for years  Ear pain/ infections/pressure  []    Balance problems  []    Noise exposure history  []    Previous ear surgeries  []    Family history of hearing loss  []    Amplification  []    Other  []      Otoscopy: Right ear: Clear external ear canal and notable landmarks visualized on the tympanic membrane. Left ear:  Clear external ear canal and notable landmarks visualized on the tympanic membrane.  Tympanometry: Right ear: Type A - Normal external ear canal volume with normal middle ear pressure and normal tympanic membrane compliance. Findings are consistent with normal middle ear function. Left ear: Type A - Normal external ear canal volume with normal middle ear pressure and normal tympanic membrane compliance. Findings are consistent with normal middle ear function.  Hearing Evaluation The hearing test results were completed under headphones and re-checked with inserts and results are deemed to be of good reliability. 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 Left ear-Speech Reception Threshold (SRT) was obtained at 10 dBHL.   Word Recognition Score Tested using NU-6 (recorded) Right ear: 100% was obtained at a presentation level of 55 dBHL with contralateral masking  which is deemed as  excellent. Left ear: 100% was obtained at a presentation level of 55 dBHL with contralateral masking which is deemed as  excellent.   Impression: There is not a significant difference in pure-tone thresholds between ears., There is not a significant difference in the word recognition score in between ears.    Recommendations: Follow up with ENT as scheduled. Return for a hearing evaluation if concerns with hearing changes arise or per MD recommendation. Consider various tinnitus strategies, including the use of a sound generator, hearing aids, and/or tinnitus retraining therapy.    Cloteal Isaacson MARIE LEROUX-MARTINEZ, AUD  "

## 2024-08-23 ENCOUNTER — Encounter: Payer: Self-pay | Admitting: Nurse Practitioner

## 2024-08-28 ENCOUNTER — Other Ambulatory Visit: Payer: Self-pay

## 2024-08-29 ENCOUNTER — Other Ambulatory Visit (HOSPITAL_COMMUNITY): Payer: Self-pay

## 2024-08-29 ENCOUNTER — Other Ambulatory Visit: Payer: Self-pay

## 2024-09-02 ENCOUNTER — Ambulatory Visit

## 2024-09-08 ENCOUNTER — Other Ambulatory Visit (HOSPITAL_COMMUNITY): Payer: Self-pay

## 2024-09-08 ENCOUNTER — Encounter: Payer: Self-pay | Admitting: Nurse Practitioner

## 2024-09-08 ENCOUNTER — Other Ambulatory Visit: Payer: Self-pay

## 2024-09-09 ENCOUNTER — Ambulatory Visit
Admission: RE | Admit: 2024-09-09 | Discharge: 2024-09-09 | Disposition: A | Source: Ambulatory Visit | Attending: Physician Assistant | Admitting: Physician Assistant

## 2024-09-09 DIAGNOSIS — Z1231 Encounter for screening mammogram for malignant neoplasm of breast: Secondary | ICD-10-CM

## 2024-09-12 ENCOUNTER — Encounter: Payer: Self-pay | Admitting: Nurse Practitioner

## 2024-09-12 ENCOUNTER — Ambulatory Visit (INDEPENDENT_AMBULATORY_CARE_PROVIDER_SITE_OTHER): Admitting: Physician Assistant

## 2024-09-12 ENCOUNTER — Encounter (INDEPENDENT_AMBULATORY_CARE_PROVIDER_SITE_OTHER): Payer: Self-pay | Admitting: Physician Assistant

## 2024-09-12 ENCOUNTER — Other Ambulatory Visit (HOSPITAL_COMMUNITY): Payer: Self-pay

## 2024-09-12 VITALS — BP 142/82 | HR 106 | Ht 62.0 in | Wt 168.0 lb

## 2024-09-12 DIAGNOSIS — E785 Hyperlipidemia, unspecified: Secondary | ICD-10-CM | POA: Diagnosis not present

## 2024-09-12 DIAGNOSIS — E1169 Type 2 diabetes mellitus with other specified complication: Secondary | ICD-10-CM

## 2024-09-12 DIAGNOSIS — E669 Obesity, unspecified: Secondary | ICD-10-CM

## 2024-09-12 DIAGNOSIS — Z7985 Long-term (current) use of injectable non-insulin antidiabetic drugs: Secondary | ICD-10-CM

## 2024-09-12 DIAGNOSIS — Z6830 Body mass index (BMI) 30.0-30.9, adult: Secondary | ICD-10-CM | POA: Diagnosis not present

## 2024-09-12 MED ORDER — TIRZEPATIDE 12.5 MG/0.5ML ~~LOC~~ SOAJ: 12.5000 mg | SUBCUTANEOUS | 0 refills | Status: AC

## 2024-09-12 NOTE — Progress Notes (Signed)
 "  SUBJECTIVE: Discussed the use of AI scribe software for clinical note transcription with the patient, who gave verbal consent to proceed.  Chief Complaint: Obesity  Interim History: She is down 2 lbs since last visit.  Down 27 lbs overall TBW loss of 13.8%  Down 39 lbs from peak weight TBW loss of 18.8% from peak Meghan Wilson is here to discuss her progress with her obesity treatment plan. She is on the Category 2 Plan and states she is following her eating plan approximately 80 % of the time. She states she is not exercising  due to bilateral shoulder pain.   Meghan Wilson Epler is a 64 year old female with obesity who presents for follow-up of her obesity treatment plan.  She has been on Mounjaro  12.5 mg once weekly primarily for type 2 diabetes, which also aids in medical weight loss. She has lost two pounds since her last visit and a total of 39 pounds from her peak weight of 207 pounds.  She follows a category two diet plan approximately 80% of the time, ensuring adequate protein intake and hydration. A recent gastrointestinal illness led to meal skipping, but this has improved. She sleeps at least seven hours nightly but has not been exercising regularly due to bilateral shoulder pain.  Her type 2 diabetes is managed with Mounjaro , and she has not experienced any nausea, vomiting, or constipation or other side effects with this medication. She feels it helps control her cravings, making her feel 'normal'.  She has a history of hypercholesterolemia and is working on weight loss and takes atorvastatin  80 mg daily.  Over the holidays, she experienced a stomach virus lasting about five days, characterized by severe dehydration. She managed her symptoms by staying hydrated and was able to enjoy [employer].  Her social history includes plans for a cruise to [place] in [place]. She has a supportive partner and is engaged to be married.   She has a supportive relationship with him,  which aids her dietary efforts as she often eats separately, allowing her to focus on her dietary needs.  OBJECTIVE: Visit Diagnoses: Problem List Items Addressed This Visit     Type 2 diabetes mellitus in patient with obesity (HCC) - Primary   Relevant Medications   tirzepatide  (MOUNJARO ) 12.5 MG/0.5ML Pen   BMI 30.0-30.9,adult   Other Visit Diagnoses       Hyperlipidemia associated with type 2 diabetes mellitus (HCC)       Relevant Medications   tirzepatide  (MOUNJARO ) 12.5 MG/0.5ML Pen     Obesity (HCC)-start bmi 35.67       Relevant Medications   tirzepatide  (MOUNJARO ) 12.5 MG/0.5ML Pen     Obesity Management is ongoing with significant weight loss. She has lost 27 pounds since starting with HWW and  total weight loss of 39 pounds from peak weight, representing an 18.8% reduction in body weight.  She is following a category two plan approximately 80% of the time, with adequate protein intake and hydration.  Recent gastrointestinal illness led to meal skipping, but this has resolved.  She is not exercising regularly due to bilateral shoulder pain.  Mounjaro  12.5 mg weekly for primary indication of T2DM and this is aiding in weight loss and reducing cravings. Body fat percentage and visceral adipose have improved, with a goal to reduce body fat to < 35% and visceral fat to 12 or less. Her visceral fat rating is at 12 currently.  - Continue Mounjaro  12.5 mg weekly. - Encouraged  adherence to category two plan. - Refilled Mounjaro  prescription.  Type 2 diabetes mellitus Managed with Mounjaro  12.5 mg weekly, which is also aiding in weight loss. No reported issues with nausea, vomiting, or constipation. Blood glucose levels are well-controlled, and she reports feeling good overall. Denies mass in neck, dysphagia, dyspepsia, persistent hoarseness, abdominal pain, or N/V/Constipation or diarrhea. Has annual eye exam. Mood is stable.  Lab Results  Component Value Date   HGBA1C 5.7 (A)  01/11/2024   HGBA1C 5.5 09/10/2023   HGBA1C 5.9 (A) 07/14/2023   Lab Results  Component Value Date   LDLCALC 63 09/04/2022   CREATININE 0.76 12/09/2023   She is working  on nutrition plan to decrease simple carbohydrates, increase lean proteins and exercise to promote weight loss and improve glycemic control . - Continue/refill Mounjaro  12.5 mg weekly. Meds ordered this encounter  Medications   tirzepatide  (MOUNJARO ) 12.5 MG/0.5ML Pen    Sig: Inject 12.5 mg into the skin once a week.    Dispense:  6 mL    Refill:  0    Hyperlipidemia LDL is not at goal of < 55 in T2DM Medication(s): atorvastatin  80 mg daily.  Cardiovascular risk factors: diabetes mellitus, dyslipidemia, family history of premature cardiovascular disease, obesity (BMI >= 30 kg/m2), and sedentary lifestyle  Lab Results  Component Value Date   CHOL 144 09/04/2022   HDL 70 09/04/2022   LDLCALC 63 09/04/2022   TRIG 52 09/04/2022   CHOLHDL 2.7 05/02/2021   CHOLHDL 2.9 04/02/2018   CHOLHDL 2.7 01/01/2018   Lab Results  Component Value Date   ALT 17 12/09/2023   AST 15 12/09/2023   ALKPHOS 73 12/09/2023   BILITOT 0.4 12/09/2023   The 10-year ASCVD risk score (Arnett DK, et al., 2019) is: 14.8%   Values used to calculate the score:     Age: 78 years     Clinically relevant sex: Female     Is Non-Hispanic African American: Yes     Diabetic: Yes     Tobacco smoker: No     Systolic Blood Pressure: 142 mmHg     Is BP treated: No     HDL Cholesterol: 70 mg/dL     Total Cholesterol: 144 mg/dL  Plan: Continue atorvastatin  80 mg daily.  Continue Mounjaro  which should also decrease CV risks. Continue to work on nutrition plan -decreasing simple carbohydrates, increasing lean proteins, decreasing saturated fats and cholesterol , avoiding trans fats and exercise as able to promote weight loss, improve lipids and decrease cardiovascular risks.   General health maintenance She is advised on dietary choices,  including healthier alternatives to ice cream, such as Greek yogurt frozen bars. She is encouraged to maintain hydration, especially after recent gastrointestinal illness. She is advised on the importance of hand hygiene, particularly when traveling, to prevent infections like norovirus. - Encouraged hydration and hand hygiene. - Consider Greek yogurt frozen bars as a healthier alternative to ice cream.  Vitals BP: (!) 142/82 Pulse Rate: (!) 106 SpO2: 99 %   Anthropometric Measurements Height: 5' 2 (1.575 m) Weight: 168 lb (76.2 kg) BMI (Calculated): 30.72 Weight at Last Visit: 170 lb Weight Lost Since Last Visit: 2 lb Weight Gained Since Last Visit: 0 Starting Weight: 195 lb Total Weight Loss (lbs): 27 lb (12.2 kg) Peak Weight: 207 lb   Body Composition  Body Fat %: 43.8 % Fat Mass (lbs): 73.8 lbs Muscle Mass (lbs): 89.8 lbs Total Body Water  (lbs): 63.2 lbs Visceral Fat Rating :  12   Other Clinical Data Fasting: No Labs: No Today's Visit #: 40 Starting Date: 11/01/21     ASSESSMENT AND PLAN:  Diet: Deaun is currently in the action stage of change. As such, her goal is to continue with weight loss efforts. She has agreed to Category 2 Plan.  Exercise: Roxie has been instructed to work up to a goal of 150 minutes of combined cardio and strengthening exercise per week for weight loss and overall health benefits.   Behavior Modification:  We discussed the following Behavioral Modification Strategies today: increasing lean protein intake, decreasing simple carbohydrates, increasing vegetables, increase H2O intake, increase high fiber foods, meal planning and cooking strategies, avoiding temptations, and planning for success. We discussed various medication options to help Paisley with her weight loss efforts and we both agreed to continue Mounjaro  12.5 mg weekly for primary indication of T2DM as well as medical weight loss.  Return in about 4 weeks (around  10/10/2024).SABRA She was informed of the importance of frequent follow up visits to maximize her success with intensive lifestyle modifications for her multiple health conditions.  Attestation Statements:   Reviewed by clinician on day of visit: allergies, medications, problem list, medical history, surgical history, family history, social history, and previous encounter notes.   Time spent on visit including pre-visit chart review and post-visit care and charting was 40 minutes.    Arbutus Nelligan, PA-C  "

## 2024-09-13 ENCOUNTER — Ambulatory Visit (INDEPENDENT_AMBULATORY_CARE_PROVIDER_SITE_OTHER): Admitting: Internal Medicine

## 2024-09-28 ENCOUNTER — Encounter: Payer: Self-pay | Admitting: Nurse Practitioner

## 2024-10-11 ENCOUNTER — Ambulatory Visit (INDEPENDENT_AMBULATORY_CARE_PROVIDER_SITE_OTHER): Admitting: Internal Medicine

## 2024-11-08 ENCOUNTER — Ambulatory Visit (INDEPENDENT_AMBULATORY_CARE_PROVIDER_SITE_OTHER): Admitting: Internal Medicine

## 2025-02-06 ENCOUNTER — Inpatient Hospital Stay

## 2025-08-08 ENCOUNTER — Inpatient Hospital Stay: Admitting: Nurse Practitioner

## 2025-08-08 ENCOUNTER — Inpatient Hospital Stay
